# Patient Record
Sex: Female | Born: 1950 | Race: White | Hispanic: No | Marital: Married | Smoking: Former smoker
Health system: Southern US, Community
[De-identification: ages and names within clinical notes are randomized; demographics above are authoritative.]

## PROBLEM LIST (undated history)

## (undated) DIAGNOSIS — R519 Headache, unspecified: Secondary | ICD-10-CM

## (undated) DIAGNOSIS — M8580 Other specified disorders of bone density and structure, unspecified site: Secondary | ICD-10-CM

## (undated) DIAGNOSIS — E785 Hyperlipidemia, unspecified: Secondary | ICD-10-CM

## (undated) DIAGNOSIS — D241 Benign neoplasm of right breast: Secondary | ICD-10-CM

## (undated) DIAGNOSIS — D332 Benign neoplasm of brain, unspecified: Secondary | ICD-10-CM

## (undated) DIAGNOSIS — E669 Obesity, unspecified: Secondary | ICD-10-CM

## (undated) DIAGNOSIS — F329 Major depressive disorder, single episode, unspecified: Secondary | ICD-10-CM

## (undated) DIAGNOSIS — F32A Depression, unspecified: Secondary | ICD-10-CM

## (undated) DIAGNOSIS — F3340 Major depressive disorder, recurrent, in remission, unspecified: Secondary | ICD-10-CM

## (undated) DIAGNOSIS — I739 Peripheral vascular disease, unspecified: Secondary | ICD-10-CM

## (undated) DIAGNOSIS — N189 Chronic kidney disease, unspecified: Secondary | ICD-10-CM

## (undated) DIAGNOSIS — R55 Syncope and collapse: Secondary | ICD-10-CM

## (undated) DIAGNOSIS — D242 Benign neoplasm of left breast: Secondary | ICD-10-CM

## (undated) DIAGNOSIS — E119 Type 2 diabetes mellitus without complications: Secondary | ICD-10-CM

## (undated) HISTORY — DX: Obesity, unspecified: E66.9

## (undated) HISTORY — DX: Major depressive disorder, single episode, unspecified: F32.9

## (undated) HISTORY — DX: Hyperlipidemia, unspecified: E78.5

## (undated) HISTORY — DX: Type 2 diabetes mellitus without complications: E11.9

## (undated) HISTORY — PX: COSMETIC SURGERY: SHX468

## (undated) HISTORY — DX: Peripheral vascular disease, unspecified: I73.9

## (undated) HISTORY — PX: ABDOMINAL HYSTERECTOMY: SHX81

## (undated) HISTORY — DX: Depression, unspecified: F32.A

## (undated) HISTORY — DX: Other specified disorders of bone density and structure, unspecified site: M85.80

## (undated) HISTORY — DX: Syncope and collapse: R55

## (undated) HISTORY — DX: Major depressive disorder, recurrent, in remission, unspecified: F33.40

---

## 1973-03-15 HISTORY — PX: CHOLECYSTECTOMY: SHX55

## 1996-03-15 DIAGNOSIS — R55 Syncope and collapse: Secondary | ICD-10-CM

## 1996-03-15 HISTORY — DX: Syncope and collapse: R55

## 2005-12-01 ENCOUNTER — Ambulatory Visit: Payer: Self-pay | Admitting: Internal Medicine

## 2005-12-30 ENCOUNTER — Ambulatory Visit: Payer: Self-pay | Admitting: Internal Medicine

## 2006-03-23 ENCOUNTER — Ambulatory Visit: Payer: Self-pay | Admitting: Internal Medicine

## 2006-04-26 ENCOUNTER — Ambulatory Visit: Payer: Self-pay | Admitting: Internal Medicine

## 2006-05-05 ENCOUNTER — Encounter: Payer: Self-pay | Admitting: Internal Medicine

## 2006-09-01 DIAGNOSIS — F3340 Major depressive disorder, recurrent, in remission, unspecified: Secondary | ICD-10-CM | POA: Insufficient documentation

## 2006-09-01 DIAGNOSIS — E785 Hyperlipidemia, unspecified: Secondary | ICD-10-CM | POA: Insufficient documentation

## 2006-09-01 HISTORY — DX: Major depressive disorder, recurrent, in remission, unspecified: F33.40

## 2006-09-21 ENCOUNTER — Ambulatory Visit: Payer: Self-pay | Admitting: Internal Medicine

## 2007-01-31 ENCOUNTER — Telehealth (INDEPENDENT_AMBULATORY_CARE_PROVIDER_SITE_OTHER): Payer: Self-pay | Admitting: *Deleted

## 2007-02-22 ENCOUNTER — Encounter: Payer: Self-pay | Admitting: Internal Medicine

## 2007-04-24 ENCOUNTER — Ambulatory Visit: Payer: Self-pay | Admitting: Internal Medicine

## 2007-04-25 ENCOUNTER — Encounter: Payer: Self-pay | Admitting: Internal Medicine

## 2007-10-23 ENCOUNTER — Ambulatory Visit: Payer: Self-pay | Admitting: Internal Medicine

## 2007-10-23 DIAGNOSIS — R002 Palpitations: Secondary | ICD-10-CM | POA: Insufficient documentation

## 2007-10-25 LAB — CONVERTED CEMR LAB
AST: 21 units/L (ref 0–37)
Albumin: 3.9 g/dL (ref 3.5–5.2)
Alkaline Phosphatase: 97 units/L (ref 39–117)
Basophils Relative: 0.5 % (ref 0.0–3.0)
Calcium: 9.4 mg/dL (ref 8.4–10.5)
Creatinine, Ser: 1.1 mg/dL (ref 0.4–1.2)
Creatinine,U: 97.1 mg/dL
Eosinophils Relative: 1.2 % (ref 0.0–5.0)
GFR calc Af Amer: 66 mL/min
GFR calc non Af Amer: 55 mL/min
HDL: 30.7 mg/dL — ABNORMAL LOW (ref >39.0)
Hemoglobin: 14.9 g/dL (ref 12.0–15.0)
Hgb A1c MFr Bld: 12.6 % — ABNORMAL HIGH (ref 4.6–6.0)
Lymphocytes Relative: 29 % (ref 12.0–46.0)
MCHC: 34.8 g/dL (ref 30.0–36.0)
MCV: 89.8 fL (ref 78.0–100.0)
Neutro Abs: 4.6 10*3/uL (ref 1.4–7.7)
Neutrophils Relative %: 64.3 % (ref 43.0–77.0)
Phosphorus: 3.4 mg/dL (ref 2.3–4.6)
RBC: 4.78 M/uL (ref 3.87–5.11)
Sodium: 136 meq/L (ref 135–145)
Total Protein: 6.7 g/dL (ref 6.0–8.3)
VLDL: 105 mg/dL — ABNORMAL HIGH (ref 0–40)
WBC: 7.2 10*3/uL (ref 4.5–10.5)

## 2007-10-26 ENCOUNTER — Telehealth: Payer: Self-pay | Admitting: Internal Medicine

## 2007-11-07 ENCOUNTER — Ambulatory Visit: Payer: Self-pay | Admitting: Internal Medicine

## 2007-11-07 ENCOUNTER — Encounter: Payer: Self-pay | Admitting: Internal Medicine

## 2007-11-07 DIAGNOSIS — R928 Other abnormal and inconclusive findings on diagnostic imaging of breast: Secondary | ICD-10-CM | POA: Insufficient documentation

## 2007-11-09 ENCOUNTER — Ambulatory Visit: Payer: Self-pay | Admitting: Internal Medicine

## 2007-11-13 ENCOUNTER — Ambulatory Visit: Payer: Self-pay | Admitting: Internal Medicine

## 2007-11-13 ENCOUNTER — Encounter: Payer: Self-pay | Admitting: Internal Medicine

## 2007-11-14 ENCOUNTER — Encounter: Payer: Self-pay | Admitting: Internal Medicine

## 2007-11-24 ENCOUNTER — Ambulatory Visit: Payer: Self-pay | Admitting: Internal Medicine

## 2007-11-24 DIAGNOSIS — R079 Chest pain, unspecified: Secondary | ICD-10-CM | POA: Insufficient documentation

## 2008-03-05 ENCOUNTER — Ambulatory Visit: Payer: Self-pay | Admitting: Internal Medicine

## 2008-03-06 LAB — CONVERTED CEMR LAB: Hgb A1c MFr Bld: 10.8 % — ABNORMAL HIGH (ref 4.6–6.0)

## 2008-04-10 ENCOUNTER — Ambulatory Visit: Payer: Self-pay | Admitting: Internal Medicine

## 2008-04-11 ENCOUNTER — Telehealth: Payer: Self-pay | Admitting: Internal Medicine

## 2008-04-11 LAB — CONVERTED CEMR LAB
ALT: 18 units/L (ref 0–35)
Albumin: 3.9 g/dL (ref 3.5–5.2)
Bilirubin, Direct: 0.1 mg/dL (ref 0.0–0.3)
Cholesterol: 206 mg/dL (ref 0–200)
HDL: 30.9 mg/dL — ABNORMAL LOW (ref >39.0)
Total Protein: 7.3 g/dL (ref 6.0–8.3)
Triglycerides: 196 mg/dL — ABNORMAL HIGH (ref 0–149)
VLDL: 39 mg/dL (ref 0–40)

## 2008-07-03 ENCOUNTER — Ambulatory Visit: Payer: Self-pay | Admitting: Internal Medicine

## 2008-07-04 LAB — CONVERTED CEMR LAB: Hgb A1c MFr Bld: 9.4 % — ABNORMAL HIGH (ref 4.6–6.5)

## 2008-12-25 ENCOUNTER — Ambulatory Visit: Payer: Self-pay | Admitting: Internal Medicine

## 2008-12-26 ENCOUNTER — Encounter: Payer: Self-pay | Admitting: Internal Medicine

## 2008-12-26 ENCOUNTER — Ambulatory Visit: Payer: Self-pay | Admitting: Internal Medicine

## 2008-12-26 LAB — CONVERTED CEMR LAB
BUN: 9 mg/dL (ref 6–23)
Bilirubin, Direct: 0.1 mg/dL (ref 0.0–0.3)
CO2: 30 meq/L (ref 19–32)
Calcium: 9.4 mg/dL (ref 8.4–10.5)
Chloride: 100 meq/L (ref 96–112)
Eosinophils Relative: 2.6 % (ref 0.0–5.0)
GFR calc non Af Amer: 54.3 mL/min (ref >60)
HCT: 41.5 % (ref 36.0–46.0)
HDL: 36.4 mg/dL — ABNORMAL LOW (ref >39.00)
Hemoglobin: 14.1 g/dL (ref 12.0–15.0)
Hgb A1c MFr Bld: 8.7 % — ABNORMAL HIGH (ref 4.6–6.5)
Lymphs Abs: 2.3 10*3/uL (ref 0.7–4.0)
MCV: 90.9 fL (ref 78.0–100.0)
Monocytes Relative: 4.7 % (ref 3.0–12.0)
Neutro Abs: 5.2 10*3/uL (ref 1.4–7.7)
RDW: 12.8 % (ref 11.5–14.6)
Total Bilirubin: 1.1 mg/dL (ref 0.3–1.2)
VLDL: 55.2 mg/dL — ABNORMAL HIGH (ref 0.0–40.0)
WBC: 8.2 10*3/uL (ref 4.5–10.5)

## 2008-12-31 ENCOUNTER — Encounter: Payer: Self-pay | Admitting: Internal Medicine

## 2009-01-07 ENCOUNTER — Ambulatory Visit: Payer: Self-pay | Admitting: Internal Medicine

## 2009-01-08 ENCOUNTER — Encounter: Payer: Self-pay | Admitting: Internal Medicine

## 2009-07-01 ENCOUNTER — Ambulatory Visit: Payer: Self-pay | Admitting: Internal Medicine

## 2009-07-02 LAB — CONVERTED CEMR LAB: Hgb A1c MFr Bld: 9.9 % — ABNORMAL HIGH (ref 4.6–6.5)

## 2009-12-30 ENCOUNTER — Encounter: Payer: Self-pay | Admitting: Internal Medicine

## 2009-12-30 ENCOUNTER — Ambulatory Visit: Payer: Self-pay | Admitting: Internal Medicine

## 2009-12-31 LAB — CONVERTED CEMR LAB
ALT: 17 units/L (ref 0–35)
Alkaline Phosphatase: 92 units/L (ref 39–117)
BUN: 10 mg/dL (ref 6–23)
Basophils Absolute: 0 10*3/uL (ref 0.0–0.1)
Bilirubin, Direct: 0.2 mg/dL (ref 0.0–0.3)
Chloride: 103 meq/L (ref 96–112)
Creatinine, Ser: 1.1 mg/dL (ref 0.4–1.2)
Creatinine,U: 246 mg/dL
Eosinophils Absolute: 0.1 10*3/uL (ref 0.0–0.7)
GFR calc non Af Amer: 53.55 mL/min (ref >60)
HCT: 41.2 % (ref 36.0–46.0)
Hgb A1c MFr Bld: 11.2 % — ABNORMAL HIGH (ref 4.6–6.5)
Lymphs Abs: 2 10*3/uL (ref 0.7–4.0)
Microalb Creat Ratio: 1 mg/g (ref 0.0–30.0)
Microalb, Ur: 2.5 mg/dL — ABNORMAL HIGH (ref 0.0–1.9)
Monocytes Absolute: 0.5 10*3/uL (ref 0.1–1.0)
Monocytes Relative: 5.5 % (ref 3.0–12.0)
Platelets: 238 10*3/uL (ref 150.0–400.0)
RDW: 13.9 % (ref 11.5–14.6)
TSH: 1.93 microintl units/mL (ref 0.35–5.50)
Total Protein: 6.4 g/dL (ref 6.0–8.3)
VLDL: 48.4 mg/dL — ABNORMAL HIGH (ref 0.0–40.0)

## 2010-01-20 ENCOUNTER — Ambulatory Visit: Payer: Self-pay | Admitting: Internal Medicine

## 2010-01-22 ENCOUNTER — Encounter: Payer: Self-pay | Admitting: Internal Medicine

## 2010-04-14 NOTE — Letter (Signed)
Summary: [facility]: Immunoassay Fecal Occult Blood (iFOB) Order Form  [name] [facility]  [address] [place], [place] [address]   Phone: [phone]  Fax: [phone]      [name]: Immunoassay Fecal Occult Blood (iFOB) Order Form   December 30, 2009 MRN: [id number]   Jacqueline Cole 08/23/1950   Physicican Name:_______[place]__________________  Diagnosis Code:________V76.51     Cindee Salt MD

## 2010-04-14 NOTE — Assessment & Plan Note (Signed)
Summary: CPX/RBH   Vital Signs:  Patient profile:   60 year old female Weight:      236 pounds BMI:     39.41 Temp:     98.1 degrees F oral Pulse rate:   76 / minute Pulse rhythm:   regular BP sitting:   140 / 80  (left arm) Cuff size:   large  Vitals Entered By: Mervin Hack CMA Duncan Dull) (December 30, 2009 8:22 AM) CC: adult physical   History of Present Illness: Having trouble with sinuses relates to hay fever Feels some better with mucinex (decongestant)  Did go up on lantus to 35 units two times a day  realizes that she is only taking the metformin once a day Not checking her sugars No hypoglycemic reactions eye exam fine in March  Really feels great now No sig exercise   Allergies: No Known Drug Allergies  Past History:  Past medical, surgical, family and social histories (including risk factors) reviewed for relevance to current acute and chronic problems.  Past Medical History: Reviewed history from 11/24/2007 and no changes required. Depression Diabetes mellitus, type II Hyperlipidemia Obesity  Past Surgical History: Reviewed history from 09/01/2006 and no changes required. Cholecystectomy 1975 Hysterectomy 2001 injury R face-- plastic surgery as  child DM diagnosis (syncope ) 1998 vaginal delivery x 2  Family History: Reviewed history from 03/05/2008 and no changes required. Father: deceased 23 CVA, MI Mother: deceased 28  MVA Siblings: 3 brothers deceased (1 @55 --MI, 1 young in MVA, 1 lung cancer)                3 sisters 1 deceased breast cancer                 Strong CAD history HTN in pat GM No DM Breast cancer in sister and aunt No colon cancer  Social History: Reviewed history from 07/01/2009 and no changes required. 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 Eyes:  Denies double vision and vision loss-1 eye. ENT:  Complains of ringing in ears; denies decreased hearing; full dentures--no mouth sores. CV:  Complains of palpitations; denies chest pain or discomfort, difficulty breathing at night, difficulty breathing while lying down, fainting, lightheadness, and shortness of breath with exertion; palps mostly at night in bed--feels it going fast. Resp:  Complains of cough; denies shortness of breath; allergy cough. GI:  Complains of indigestion; denies abdominal pain, bloody stools, change in bowel habits, dark tarry stools, nausea, and vomiting; occ reflux---tums helps. GU:  Denies dysuria and incontinence. MS:  Complains of joint pain; denies joint swelling; having recent right neck and shoulder pain---may be from sleeping on different pillow at beach. Derm:  Denies lesion(s) and rash. Neuro:  Complains of headaches; denies numbness, tingling, and weakness; head pressure from sinus. Psych:  Denies anxiety and depression. Heme:  Denies abnormal bruising and enlarge lymph nodes. Allergy:  Complains of seasonal allergies and sneezing.  Physical Exam  General:  alert and normal appearance.   Eyes:  pupils equal, pupils round, pupils reactive to light, and no optic disk abnormalities.   Ears:  R ear normal and L ear normal.   Mouth:  no erythema, no exudates, and no lesions.   Neck:  supple, no masses, no thyromegaly, no carotid bruits, and no cervical lymphadenopathy.   Breasts:  no masses, no abnormal thickening, no tenderness, and no adenopathy.   Lungs:  normal respiratory effort, no intercostal retractions, no accessory muscle use, and normal breath sounds.   Heart:  normal rate, regular rhythm, no murmur, and no gallop.   Abdomen:  soft, non-tender, and no masses.   Msk:  no joint tenderness and no joint swelling.   Pulses:  faint in feet Extremities:  no  edema Neurologic:  alert & oriented X3, strength normal in all extremities, and gait normal.   Skin:  no rashes, no suspicious lesions, and no ulcerations.   Psych:  normally interactive, good eye contact, not anxious appearing, and not depressed appearing.    Diabetes Management Exam:    Foot Exam (with socks and/or shoes not present):       Sensory-Pinprick/Light touch:          Left medial foot (L-4): normal          Left dorsal foot (L-5): normal          Left lateral foot (S-1): normal          Right medial foot (L-4): normal          Right dorsal foot (L-5): normal          Right lateral foot (S-1): normal       Inspection:          Left foot: normal          Right foot: normal       Nails:          Left foot: normal          Right foot: normal    Eye Exam:       Eye Exam done elsewhere          Date: 05/13/2009          Results: No retinopathy          Done by: Dr Clydene Pugh   Impression & Recommendations:  Problem # 1:  PREVENTIVE HEALTH CARE (ICD-V70.0) Assessment Comment Only due for mammo will do stool immunoassay again discussed fitness flu shot given  Problem # 2:  DIABETES MELLITUS, TYPE II (ICD-250.00) Assessment: Comment Only  still seems to have poor control will have her increase the metformin to two times a day (incorrectly only taking it daily) needs to work on lifestyle since she doesn't want more insulin  Her updated medication list for this problem includes:    Metformin Hcl 1000 Mg Tabs (Metformin hcl) .Marland Kitchen... Take 1 tablet by mouth two times a day    Glipizide Xl 5 Mg Tb24 (Glipizide) .Marland Kitchen... 1 daily by mouth every am    Lantus 100 Unit/ml Soln (Insulin glargine) ..... Inject 35  units two times a day  Labs Reviewed: Creat: 1.1 (12/25/2008)     Last Eye Exam: No retinopathy (05/13/2009) Reviewed HgBA1c results: 9.9 (07/01/2009)  8.7 (12/25/2008)  Orders: TLB-A1C / Hgb A1C (Glycohemoglobin) (83036-A1C) TLB-Microalbumin/Creat Ratio, Urine  (82043-MALB) TLB-Renal Function Panel (80069-RENAL) TLB-CBC Platelet - w/Differential (85025-CBCD) TLB-TSH (Thyroid Stimulating Hormone) (84443-TSH) Venipuncture (84132)  Problem # 3:  SCREENING COLORECTAL-CANCER (ICD-V76.51) Assessment: Comment Only seems benign EKG not concerning discussed increasing physical activity  Problem # 4:  HYPERLIPIDEMIA (ICD-272.4) Assessment: Unchanged  has had good control due for labs  Her updated medication list for this problem includes:    Pravastatin Sodium 40 Mg Tabs (  Pravastatin sodium) .Marland Kitchen... 1 daily  Labs Reviewed: SGOT: 20 (12/25/2008)   SGPT: 19 (12/25/2008)  Prior 10 Yr Risk Heart Disease: Not enough information (07/01/2009)   HDL:36.40 (12/25/2008), 30.9 (04/10/2008)  LDL:DEL (04/10/2008), DEL (10/23/2007)  Chol:193 (12/25/2008), 206 (04/10/2008)  Trig:276.0 (12/25/2008), 196 (04/10/2008)  Orders: TLB-Lipid Panel (80061-LIPID) TLB-Hepatic/Liver Function Pnl (80076-HEPATIC)  Problem # 5:  DEPRESSION (ICD-311) Assessment: Improved mood is very good now no changes   Her updated medication list for this problem includes:    Fluoxetine Hcl 20 Mg Caps (Fluoxetine hcl) .Marland Kitchen... 1 daily in morning    Venlafaxine Hcl 150 Mg Xr24h-tab (Venlafaxine hcl) .Marland Kitchen... 1 two times a day  Complete Medication List: 1)  Metformin Hcl 1000 Mg Tabs (Metformin hcl) .... Take 1 tablet by mouth two times a day 2)  Glipizide Xl 5 Mg Tb24 (Glipizide) .Marland Kitchen.. 1 daily by mouth every am 3)  Fluoxetine Hcl 20 Mg Caps (Fluoxetine hcl) .Marland Kitchen.. 1 daily in morning 4)  Lantus 100 Unit/ml Soln (Insulin glargine) .... Inject 35  units two times a day 5)  Venlafaxine Hcl 150 Mg Xr24h-tab (Venlafaxine hcl) .Marland Kitchen.. 1 two times a day 6)  Pravastatin Sodium 40 Mg Tabs (Pravastatin sodium) .Marland Kitchen.. 1 daily 7)  Relion Micro Test Strp (Glucose blood) .... Test 3 times daily or as directed 8)  Bd Insulin Syringe 25g X 5/8" 1 Ml Misc (Insulin syringe-needle u-100) .... Use daily as directed  (may substitiute for insurance) 9)  Multivitamins Tabs (Multiple vitamin) .... Take 1 tablet by mouth once a day 10)  Biotin Tabs (Biotin tabs) .... Daily  by mouth 11)  Fish Oil Concentrate 1000 Mg Caps (Omega-3 fatty acids) .Marland Kitchen.. 1 tablet once a day by mouth  Other Orders: Flu Vaccine 28yrs + (29562) Admin 1st Vaccine (13086) EKG w/ Interpretation (93000) Radiology Referral (Radiology)  Patient Instructions: 1)  Please try loratadine 10mg  1-2 daily or cetirizine 10mg  daily for sinus 2)  Please make sure you take the metformin twice a day 3)  Please schedule a follow-up appointment in 6 months .  4)  Schedule your mammogram.  5)  Complete your hemoccult cards and return them soon.    Orders Added: 1)  Flu Vaccine 66yrs + [90658] 2)  Admin 1st Vaccine [90471] 3)  EKG w/ Interpretation [93000] 4)  Est. Patient 40-64 years [99396] 5)  TLB-A1C / Hgb A1C (Glycohemoglobin) [83036-A1C] 6)  TLB-Microalbumin/Creat Ratio, Urine [82043-MALB] 7)  TLB-Lipid Panel [80061-LIPID] 8)  TLB-Hepatic/Liver Function Pnl [80076-HEPATIC] 9)  TLB-Renal Function Panel [80069-RENAL] 10)  TLB-CBC Platelet - w/Differential [85025-CBCD] 11)  TLB-TSH (Thyroid Stimulating Hormone) [84443-TSH] 12)  Venipuncture [57846] 13)  Radiology Referral [Radiology]   Immunizations Administered:  Influenza Vaccine # 1:    Vaccine Type: Fluvax 3+    Site: right deltoid    Mfr: GlaxoSmithKline    Dose: 0.5 ml    Route: IM    Given by: Mervin Hack CMA (AAMA)    Exp. Date: 09/12/2010    Lot #: [id number]    VIS given: 10/07/09 version given December 30, 2009.  Flu Vaccine Consent Questions:    Do you have a history of severe allergic reactions to this vaccine? no    Any prior history of allergic reactions to egg and/or gelatin? no    Do you have a sensitivity to the preservative Thimersol? no    Do you have a past history of Guillan-Barre Syndrome? no    Do you currently have an acute febrile illness? no  Have you ever had a severe reaction to latex? no    Vaccine information given and explained to patient? yes    Are you currently pregnant? no   Immunizations Administered:  Influenza Vaccine # 1:    Vaccine Type: Fluvax 3+    Site: right deltoid    Mfr: GlaxoSmithKline    Dose: 0.5 ml    Route: IM    Given by: Mervin Hack CMA (AAMA)    Exp. Date: 09/12/2010    Lot #: [id number]    VIS given: 10/07/09 version given December 30, 2009.  Current Allergies (reviewed today): No known allergies

## 2010-04-14 NOTE — Assessment & Plan Note (Signed)
Summary: 8:00 APPT 6 MONTH FOLLOW UP/RBH   Vital Signs:  Patient profile:   60 year old female Weight:      231 pounds Temp:     98 degrees F oral Pulse rate:   72 / minute Pulse rhythm:   regular BP sitting:   112 / 70  (left arm) Cuff size:   large  Vitals Entered By: Mervin Hack CMA Duncan Dull) (July 01, 2009 7:58 AM) CC: 6 month follow-up, Hypertension Management   History of Present Illness: Doing fairly well Has decided not to try to go to work  DOing okay with just husband's income Satified with this  Had flu and then stomach flu in March  Walking about an hour a day 20 minute miles now  Checks sugars once a day 210-240 ---- checks if she doesn't feel right. These are random and not fasting Feels bad if sugars go below 130 No serious hypoglycemia  No chest pain No SOB some allergy problems---occ uses benedryl (does knock her out)  Hypertension History:      Positive major cardiovascular risk factors include female age 79 years old or older, diabetes, and hyperlipidemia.  Negative major cardiovascular risk factors include non-tobacco-user status.     Allergies: No Known Drug Allergies  Past History:  Past medical, surgical, family and social histories (including risk factors) reviewed for relevance to current acute and chronic problems.  Past Medical History: Reviewed history from 11/24/2007 and no changes required. Depression Diabetes mellitus, type II Hyperlipidemia Obesity  Past Surgical History: Reviewed history from 09/01/2006 and no changes required. Cholecystectomy 1975 Hysterectomy 2001 injury R face-- plastic surgery as  child DM diagnosis (syncope ) 1998 vaginal delivery x 2  Family History: Reviewed history from 03/05/2008 and no changes required. 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   Neck:  supple, no masses, no thyromegaly, no carotid bruits, and no cervical lymphadenopathy.   Lungs:  normal respiratory effort and normal breath sounds.   Heart:  normal rate, regular rhythm, no murmur, and no gallop.   Msk:  no joint tenderness and no joint swelling.   Pulses:  1+ in feet Extremities:  no edema Skin:  no rashes, no suspicious lesions, and no ulcerations.   Psych:  normally interactive, good eye contact, not anxious appearing, and not depressed appearing.    Diabetes Management Exam:    Foot Exam (with socks and/or shoes not present):       Sensory-Pinprick/Light touch:          Left medial foot (L-4): normal          Left dorsal foot (L-5): normal          Left lateral foot (S-1): normal  Right medial foot (L-4): normal          Right dorsal foot (L-5): normal          Right lateral foot (S-1): normal       Inspection:          Left foot: normal          Right foot: normal       Nails:          Left foot: normal          Right foot: normal    Eye Exam:       Eye Exam done elsewhere          Date: 11/13/2008          Results: No retinopathy          Done by: Dr Clydene Pugh   Impression & Recommendations:  Problem # 1:  DIABETES MELLITUS, TYPE II (ICD-250.00) Assessment Unchanged  seems to be better with lifestyle hopefully will be better goal <8% but action if back over 9%  Her updated medication list for this problem includes:    Metformin Hcl 1000 Mg Tabs (Metformin hcl)  .Marland Kitchen... Take 1 tablet by mouth two times a day    Glipizide Xl 5 Mg Tb24 (Glipizide) .Marland Kitchen... 1 daily by mouth every am    Lantus 100 Unit/ml Soln (Insulin glargine) ..... Inject 30  units two times a day  Labs Reviewed: Creat: 1.1 (12/25/2008)     Last Eye Exam: No retinopathy (11/13/2008) Reviewed HgBA1c results: 8.7 (12/25/2008)  9.4 (07/03/2008)  Orders: TLB-A1C / Hgb A1C (Glycohemoglobin) (83036-A1C) Venipuncture ([address])  Problem # 2:  DEPRESSION (ICD-311) Assessment: Improved mood has done well no changes  Her updated medication list for this problem includes:    Fluoxetine Hcl 20 Mg Caps (Fluoxetine hcl) .Marland Kitchen... 1 daily in morning    Venlafaxine Hcl 150 Mg Xr24h-tab (Venlafaxine hcl) .Marland Kitchen... 1 two times a day  Problem # 3:  HYPERLIPIDEMIA (ICD-272.4) Assessment: Unchanged fairly good control will check next time  Her updated medication list for this problem includes:    Pravastatin Sodium 40 Mg Tabs (Pravastatin sodium) .Marland Kitchen... 1 daily  Labs Reviewed: SGOT: 20 (12/25/2008)   SGPT: 19 (12/25/2008)  10 Yr Risk Heart Disease: Not enough information   HDL:36.40 (12/25/2008), 30.9 (04/10/2008)  LDL:DEL (04/10/2008), DEL (10/23/2007)  Chol:193 (12/25/2008), 206 (04/10/2008)  Trig:276.0 (12/25/2008), 196 (04/10/2008)  Complete Medication List: 1)  Metformin Hcl 1000 Mg Tabs (Metformin hcl) .... Take 1 tablet by mouth two times a day 2)  Glipizide Xl 5 Mg Tb24 (Glipizide) .Marland Kitchen.. 1 daily by mouth every am 3)  Fluoxetine Hcl 20 Mg Caps (Fluoxetine hcl) .Marland Kitchen.. 1 daily in morning 4)  Lantus 100 Unit/ml Soln (Insulin glargine) .... Inject 30  units two times a day 5)  Venlafaxine Hcl 150 Mg Xr24h-tab (Venlafaxine hcl) .Marland Kitchen.. 1 two times a day 6)  Pravastatin Sodium 40 Mg Tabs (Pravastatin sodium) .Marland Kitchen.. 1 daily 7)  Fish Oil Concentrate 1000 Mg Caps (Omega-3 fatty acids) .Marland Kitchen.. 1 tablet once a day by mouth 8)  Relion Micro Test Strp (Glucose blood) .... Test 3 times daily or as directed 9)  Bd  Insulin Syringe 25g X 5/8" 1 Ml Misc (Insulin syringe-needle u-100) .... Use daily as directed (may substitiute for insurance) 10)  Multivitamins Tabs (Multiple vitamin) .... Take 1 tablet by mouth once a day 11)  Biotin Tabs (Biotin tabs) .... Daily  by mouth  Hypertension Assessment/Plan:  The patient's hypertensive risk group is category C: Target organ damage and/or diabetes.  Today's blood pressure is 112/70.    Patient Instructions: 1)  Please loratadine 10mg   1-2 daily or cetirizine 10mg  daily for allergy symptoms 2)  Please schedule a follow-up appointment in 6 months for physical  Current Allergies (reviewed today): No known allergies

## 2010-04-14 NOTE — Letter (Signed)
Summary: Results Follow up Letter  [place] [facility]  [address] [place], [place] [address]   Phone: [phone]  Fax: [phone]    01/22/2010 MRN: [id number]  Jacqueline Cole [address] [place], [place]  [address]  Dear [name],  The following are the results of your recent test(s):  Test         Result    Pap Smear:        Normal _____  Not Normal _____ Comments: ______________________________________________________ Cholesterol: LDL(Bad cholesterol):         Your goal is less than:         HDL (Good cholesterol):       Your goal is more than: Comments:  ______________________________________________________ Mammogram:        Normal _____  Not Normal _____ Comments:  ___________________________________________________________________ Hemoccult:        Normal __X___  Not normal _______ Comments:  stool test does not show any blood, we will plan to repeat this next year  _____________________________________________________________________ Other Tests:    We routinely do not discuss normal results over the telephone.  If you desire a copy of the results, or you have any questions about this information we can discuss them at your next office visit.   Sincerely,      Tillman Abide, MD

## 2010-05-16 ENCOUNTER — Encounter: Payer: Self-pay | Admitting: Internal Medicine

## 2010-07-01 ENCOUNTER — Other Ambulatory Visit: Payer: Self-pay | Admitting: Internal Medicine

## 2010-07-01 ENCOUNTER — Ambulatory Visit (INDEPENDENT_AMBULATORY_CARE_PROVIDER_SITE_OTHER): Payer: PRIVATE HEALTH INSURANCE | Admitting: Internal Medicine

## 2010-07-01 ENCOUNTER — Encounter: Payer: Self-pay | Admitting: Internal Medicine

## 2010-07-01 ENCOUNTER — Telehealth: Payer: Self-pay | Admitting: *Deleted

## 2010-07-01 VITALS — BP 118/80 | HR 94 | Temp 98.2°F | Ht 65.0 in | Wt 233.0 lb

## 2010-07-01 DIAGNOSIS — E119 Type 2 diabetes mellitus without complications: Secondary | ICD-10-CM

## 2010-07-01 DIAGNOSIS — F329 Major depressive disorder, single episode, unspecified: Secondary | ICD-10-CM

## 2010-07-01 DIAGNOSIS — E785 Hyperlipidemia, unspecified: Secondary | ICD-10-CM

## 2010-07-01 MED ORDER — METHYLPHENIDATE HCL 5 MG PO TABS: 5.0000 mg | ORAL_TABLET | Freq: Two times a day (BID) | ORAL | Status: DC

## 2010-07-01 MED ORDER — INSULIN GLARGINE 100 UNIT/ML ~~LOC~~ SOLN: 50.0000 [IU] | Freq: Two times a day (BID) | SUBCUTANEOUS | Status: DC

## 2010-07-01 MED ORDER — GLIPIZIDE ER 5 MG PO TB24: 5.0000 mg | ORAL_TABLET | Freq: Two times a day (BID) | ORAL | Status: DC

## 2010-07-01 NOTE — Telephone Encounter (Signed)
[facility] keeps old instructions even when you change them--this can be confusing

## 2010-07-01 NOTE — Progress Notes (Signed)
Subjective:    Patient ID: Jacqueline Cole, female    DOB: 1950/12/25, 60 y.o.   MRN: [id number]  HPI Feels well  Not sweating like she used to Had period where she lost a lot of her hair--December Cut it short and increased biotin Using vitamin E and rogaine and getting better  Checks sugars 1-3 times per day Over 250 at times Average 220 Rotates the insulin regularly---legs, arms, abd (less often due to scar)  Still has issues with depression Has daily feelings of sadness Has never considered suicide  Continues on pravastatin No major muscle aches  Current outpatient prescriptions:BIOTIN PO, Take by mouth daily.  , Disp: , Rfl: ;  fish oil-omega-3 fatty acids 1000 MG capsule, Take 1 g by mouth daily.  , Disp: , Rfl: ;  FLUoxetine (PROZAC) 20 MG capsule, Take 20 mg by mouth daily.  , Disp: , Rfl: ;  glipiZIDE (GLUCOTROL) 5 MG 24 hr tablet, Take 5 mg by mouth every morning.  , Disp: , Rfl: ;  glucose blood (RELION MICRO TEST) test strip, Test 3 times daily or as directed , Disp: , Rfl:  insulin glargine (LANTUS) 100 UNIT/ML injection, Inject into the skin. Inject 35 units two times a day , Disp: , Rfl: ;  Insulin Syringe-Needle U-100 25G X 5/8" 1 ML MISC, by Does not apply route. Use daily as directed (may substitute for insurance) , Disp: , Rfl: ;  metFORMIN (GLUCOPHAGE) 1000 MG tablet, Take 1,000 mg by mouth 2 (two) times daily.  , Disp: , Rfl: ;  Multiple Vitamin (MULTIVITAMIN PO), Take by mouth.  , Disp: , Rfl:  pravastatin (PRAVACHOL) 40 MG tablet, Take 40 mg by mouth daily.  , Disp: , Rfl: ;  venlafaxine (EFFEXOR-XR) 150 MG 24 hr capsule, Take 150 mg by mouth 2 (two) times daily.  , Disp: , Rfl:   . Past Medical History  Diagnosis Date  . Depression   . Diabetes mellitus type II   . Hyperlipemia   . Obesity   . Syncope 1998    DM diagnosis    Past Surgical History  Procedure Date  . Cholecystectomy 1975  . Cosmetic surgery     Injury Right face as a child  . Vaginal  delivery     X 2    Family History  Problem Relation Age of Onset  . Stroke Father     CVA  . Heart disease Father     MI  . Cancer Sister     breast cancer  . Heart disease Brother     MI  . Cancer Brother     Lung    History   Social History  . Marital Status: Married    Spouse Name: N/A    Number of Children: 2  . Years of Education: N/A   Occupational History  .      "Just up and quit" at [employer]   Social History Main Topics  . Smoking status: Former Smoker    Quit date: 03/15/1993  . Smokeless tobacco: Not on file  . Alcohol Use: No  . Drug Use: Not on file  . Sexually Active: Not on file   Other Topics Concern  . Not on file   Social History Narrative  . No narrative on file   Review of Systems Appetite okay--eats the wrong things and too much when depressed Trying to walk Weight down 2#    Objective:   Physical Exam  Constitutional: She is oriented to person, place, and time. She appears well-developed and well-nourished. No distress.  Neck: Normal range of motion. Neck supple. No thyromegaly present.  Cardiovascular: Normal rate, regular rhythm, normal heart sounds and intact distal pulses.  Exam reveals no gallop.   No murmur heard. Pulmonary/Chest: Effort normal and breath sounds normal. No respiratory distress. She has no wheezes. She has no rales.  Musculoskeletal: Normal range of motion. She exhibits no edema and no tenderness.  Lymphadenopathy:    She has no cervical adenopathy.  Neurological: She is alert and oriented to person, place, and time.  Skin: Skin is warm. No rash noted.  Psychiatric: Her behavior is normal. Judgment and thought content normal.       Dysthymic mood with appropriate affect          Assessment & Plan:

## 2010-07-01 NOTE — Telephone Encounter (Signed)
Note faxed from [facility] [address].  They asked for clarification on pt's lantus prescription.  Script they received had two sets of instructons- 50 units twice a day and 35 units twice a day.  I read in the chart that the correct instructions were for 40 units twice a day, increase by 5 units every 4-5 days until fasting sugars are under 180.  That's what I called in.

## 2010-07-01 NOTE — Patient Instructions (Signed)
Please increase the glipizide to twice a day Increase the lantus to 40 units twice a day and increase by another 5 units every 4-5 days until fasting sugars under 180 (like 40 in AM, 45 in PM-----then 45 twice a day)

## 2010-07-31 ENCOUNTER — Ambulatory Visit (INDEPENDENT_AMBULATORY_CARE_PROVIDER_SITE_OTHER): Payer: PRIVATE HEALTH INSURANCE | Admitting: Internal Medicine

## 2010-07-31 ENCOUNTER — Encounter: Payer: Self-pay | Admitting: Internal Medicine

## 2010-07-31 VITALS — BP 130/80 | HR 97 | Temp 98.2°F | Ht 65.0 in | Wt 230.0 lb

## 2010-07-31 DIAGNOSIS — F329 Major depressive disorder, single episode, unspecified: Secondary | ICD-10-CM

## 2010-07-31 DIAGNOSIS — E119 Type 2 diabetes mellitus without complications: Secondary | ICD-10-CM

## 2010-07-31 MED ORDER — METFORMIN HCL 1000 MG PO TABS: 1000.0000 mg | ORAL_TABLET | Freq: Two times a day (BID) | ORAL | Status: DC

## 2010-07-31 MED ORDER — VENLAFAXINE HCL ER 150 MG PO CP24: 150.0000 mg | ORAL_CAPSULE | Freq: Two times a day (BID) | ORAL | Status: DC

## 2010-07-31 MED ORDER — FLUOXETINE HCL 20 MG PO CAPS: 20.0000 mg | ORAL_CAPSULE | Freq: Every day | ORAL | Status: DC

## 2010-07-31 MED ORDER — PRAVASTATIN SODIUM 40 MG PO TABS: 40.0000 mg | ORAL_TABLET | Freq: Every day | ORAL | Status: DC

## 2010-07-31 MED ORDER — METHYLPHENIDATE HCL 5 MG PO TABS: 5.0000 mg | ORAL_TABLET | Freq: Two times a day (BID) | ORAL | Status: DC

## 2010-07-31 NOTE — Patient Instructions (Signed)
Please cut your evening dose of insulin to 40 units. If the low sugar reactions continue, cut the morning dose to 40 also

## 2010-07-31 NOTE — Progress Notes (Signed)
Subjective:    Patient ID: Jacqueline Cole, female    DOB: 02/06/1951, 60 y.o.   MRN: [id number]  HPI Has noticed her sweats are back Not just her head but her whole body Needs to use towels in bed  Noticed minimal loss of appetite in the first week on ritalin Appetite definitely down on the ritalin Started walking 2 miles per day the second week Sugars are better and has had some low sugar reactions  Mood is much better Sleeping better other than the sweats  Current outpatient prescriptions:BIOTIN PO, Take by mouth daily.  , Disp: , Rfl: ;  fish oil-omega-3 fatty acids 1000 MG capsule, Take 1 g by mouth daily.  , Disp: , Rfl: ;  FLUoxetine (PROZAC) 20 MG capsule, Take 1 capsule (20 mg total) by mouth daily., Disp: 90 capsule, Rfl: 3;  glipiZIDE (GLUCOTROL) 5 MG 24 hr tablet, Take 1 tablet (5 mg total) by mouth 2 (two) times daily., Disp: 60 tablet, Rfl: 11 glucose blood (RELION MICRO TEST) test strip, Test 3 times daily or as directed , Disp: , Rfl: ;  insulin glargine (LANTUS) 100 UNIT/ML injection, Inject 50 Units into the skin 2 (two) times daily. Inject 35 units two times a day, Disp: 30 mL, Rfl: 11;  Insulin Syringe-Needle U-100 25G X 5/8" 1 ML MISC, by Does not apply route. Use daily as directed (may substitute for insurance) , Disp: , Rfl:  metFORMIN (GLUCOPHAGE) 1000 MG tablet, Take 1 tablet (1,000 mg total) by mouth 2 (two) times daily., Disp: 60 tablet, Rfl: 11;  methylphenidate (RITALIN) 5 MG tablet, Take 1 tablet (5 mg total) by mouth 2 (two) times daily., Disp: 60 tablet, Rfl: 0;  Multiple Vitamin (MULTIVITAMIN PO), Take by mouth.  , Disp: , Rfl: ;  pravastatin (PRAVACHOL) 40 MG tablet, Take 1 tablet (40 mg total) by mouth daily., Disp: 30 tablet, Rfl: 11 venlafaxine (EFFEXOR-XR) 150 MG 24 hr capsule, Take 1 capsule (150 mg total) by mouth 2 (two) times daily., Disp: 60 capsule, Rfl: 11;  DISCONTD: FLUoxetine (PROZAC) 20 MG capsule, TAKE ONE CAPSULE BY MOUTH IN THE MORNING, Disp: 90  capsule, Rfl: 0;  DISCONTD: metFORMIN (GLUCOPHAGE) 1000 MG tablet, Take 1,000 mg by mouth 2 (two) times daily.  , Disp: , Rfl: ;  DISCONTD: pravastatin (PRAVACHOL) 40 MG tablet, Take 40 mg by mouth daily.  , Disp: , Rfl:  DISCONTD: venlafaxine (EFFEXOR-XR) 150 MG 24 hr capsule, Take 150 mg by mouth 2 (two) times daily.  , Disp: , Rfl: ;  DISCONTD: Venlafaxine HCl 150 MG TB24, TAKE ONE TABLET BY MOUTH TWICE DAILY, Disp: 60 each, Rfl: 3  Past Medical History  Diagnosis Date  . Depression   . Diabetes mellitus type II   . Hyperlipemia   . Obesity   . Syncope 1998    DM diagnosis    Past Surgical History  Procedure Date  . Cholecystectomy 1975  . Cosmetic surgery     Injury Right face as a child  . Vaginal delivery     X 2    Family History  Problem Relation Age of Onset  . Stroke Father     CVA  . Heart disease Father     MI  . Cancer Sister     breast cancer  . Heart disease Brother     MI  . Cancer Brother     Lung    History   Social History  . Marital Status: Married  Spouse Name: N/A    Number of Children: 2  . Years of Education: N/A   Occupational History  .      "Just up and quit" at [employer]   Social History Main Topics  . Smoking status: Former Smoker    Quit date: 03/15/1993  . Smokeless tobacco: Not on file  . Alcohol Use: No  . Drug Use: Not on file  . Sexually Active: Not on file   Other Topics Concern  . Not on file   Social History Narrative  . No narrative on file   Review of Systems Occ feels her heart race====not really new but may be more noticeable No chest pain No SOB Lost 4.5#    Objective:   Physical Exam  Constitutional: She appears well-developed and well-nourished.  Psychiatric: She has a normal mood and affect. Her behavior is normal. Judgment and thought content normal.          Assessment & Plan:

## 2010-08-12 ENCOUNTER — Other Ambulatory Visit: Payer: Self-pay | Admitting: *Deleted

## 2010-08-12 MED ORDER — GLUCOSE BLOOD VI STRP: ORAL_STRIP | Status: DC

## 2010-08-12 NOTE — Telephone Encounter (Signed)
Patient changed ins provider and they no longer cover her current meter and test strips. Per pharmacy her insurance covers accucheck. New rx for accucheck compact (includes lancets and strips) sent to patients pharmacy.

## 2010-08-28 ENCOUNTER — Other Ambulatory Visit: Payer: Self-pay | Admitting: *Deleted

## 2010-08-28 MED ORDER — METHYLPHENIDATE HCL 5 MG PO TABS: 5.0000 mg | ORAL_TABLET | Freq: Two times a day (BID) | ORAL | Status: DC

## 2010-08-31 ENCOUNTER — Other Ambulatory Visit: Payer: Self-pay | Admitting: *Deleted

## 2010-08-31 MED ORDER — METHYLPHENIDATE HCL 5 MG PO TABS: 5.0000 mg | ORAL_TABLET | Freq: Two times a day (BID) | ORAL | Status: DC

## 2010-08-31 NOTE — Telephone Encounter (Signed)
Pt had called on Friday to have her ritalin script refilled.  This was sent to Dr. Dayton Martes, but script cannot be found.  Pt needs this today, she is out and has had a death in the family.  Please call when ready.

## 2010-08-31 NOTE — Telephone Encounter (Signed)
Left message on machine that rx is ready for pick-up, and it will be at our front desk.  

## 2010-09-01 MED ORDER — METHYLPHENIDATE HCL 5 MG PO TABS: 5.0000 mg | ORAL_TABLET | Freq: Two times a day (BID) | ORAL | Status: DC

## 2010-09-01 NOTE — Telephone Encounter (Signed)
Spoke with patients husband and he stated that patient picked up this Rx on yesterday.

## 2010-09-25 ENCOUNTER — Other Ambulatory Visit: Payer: Self-pay | Admitting: *Deleted

## 2010-09-25 MED ORDER — METFORMIN HCL 1000 MG PO TABS: 1000.0000 mg | ORAL_TABLET | Freq: Two times a day (BID) | ORAL | Status: DC

## 2010-09-28 ENCOUNTER — Other Ambulatory Visit: Payer: Self-pay | Admitting: *Deleted

## 2010-09-28 MED ORDER — METHYLPHENIDATE HCL 5 MG PO TABS: 5.0000 mg | ORAL_TABLET | Freq: Two times a day (BID) | ORAL | Status: DC

## 2010-09-28 NOTE — Telephone Encounter (Signed)
Spoke with patient and advised results   

## 2010-09-28 NOTE — Telephone Encounter (Signed)
Please call pt when ready.

## 2010-10-29 ENCOUNTER — Other Ambulatory Visit: Payer: Self-pay | Admitting: *Deleted

## 2010-10-29 MED ORDER — METHYLPHENIDATE HCL 5 MG PO TABS: 5.0000 mg | ORAL_TABLET | Freq: Two times a day (BID) | ORAL | Status: DC

## 2010-10-29 NOTE — Telephone Encounter (Signed)
Spoke with patient and advised results   

## 2010-11-04 ENCOUNTER — Ambulatory Visit (INDEPENDENT_AMBULATORY_CARE_PROVIDER_SITE_OTHER): Payer: PRIVATE HEALTH INSURANCE | Admitting: Internal Medicine

## 2010-11-04 ENCOUNTER — Encounter: Payer: Self-pay | Admitting: Internal Medicine

## 2010-11-04 DIAGNOSIS — F329 Major depressive disorder, single episode, unspecified: Secondary | ICD-10-CM

## 2010-11-04 DIAGNOSIS — E119 Type 2 diabetes mellitus without complications: Secondary | ICD-10-CM

## 2010-11-04 NOTE — Assessment & Plan Note (Signed)
Hopefully better control Goal <8% No action now other than lifestyle if under or close to 9%

## 2010-11-04 NOTE — Progress Notes (Signed)
Subjective:    Patient ID: Jacqueline Cole, female    DOB: [birth year], 60 y.o.   MRN: [id number]  HPI Doing "great" Mood is still good Her adoptive nephew just committed suicide---she was able to cope Tolerating the ritalin  Appetite did increase again and eating the wrong things after nephew's death Now working back towards eating right  Doing a lot of yard work Has stopped the walking in past week due to the weather-otherwise doing 2 miles per day House is spotless  Sugars have been much better Usually 80-100 fasting. 180 before lunch. Usually 150 again before supper No hypoglycemia lately Has adjusted lantus some--discussed this  Current Outpatient Prescriptions on File Prior to Visit  Medication Sig Dispense Refill  . BIOTIN PO Take by mouth daily.        . fish oil-omega-3 fatty acids 1000 MG capsule Take 1 g by mouth daily.        Marland Kitchen FLUoxetine (PROZAC) 20 MG capsule Take 1 capsule (20 mg total) by mouth daily.  90 capsule  3  . glipiZIDE (GLUCOTROL) 5 MG 24 hr tablet Take 1 tablet (5 mg total) by mouth 2 (two) times daily.  60 tablet  11  . glucose blood (ACCU-CHEK COMPACT STRIPS) test strip Use as instructed  100 each  12  . insulin glargine (LANTUS) 100 UNIT/ML injection Inject 50 Units into the skin every morning. And 40 units in the evening       . Insulin Syringe-Needle U-100 25G X 5/8" 1 ML MISC by Does not apply route. Use daily as directed (may substitute for insurance)       . metFORMIN (GLUCOPHAGE) 1000 MG tablet Take 1 tablet (1,000 mg total) by mouth 2 (two) times daily.  90 tablet  1  . methylphenidate (RITALIN) 5 MG tablet Take 1 tablet (5 mg total) by mouth 2 (two) times daily.  60 tablet  0  . Multiple Vitamin (MULTIVITAMIN PO) Take by mouth.        . pravastatin (PRAVACHOL) 40 MG tablet Take 1 tablet (40 mg total) by mouth daily.  30 tablet  11  . venlafaxine (EFFEXOR-XR) 150 MG 24 hr capsule Take 1 capsule (150 mg total) by mouth 2 (two) times daily.  60  capsule  11    No Known Allergies  Past Medical History  Diagnosis Date  . Depression   . Diabetes mellitus type II   . Hyperlipemia   . Obesity   . Syncope 1998    DM diagnosis    Past Surgical History  Procedure Date  . Cholecystectomy 1975  . Cosmetic surgery     Injury Right face as a child  . Vaginal delivery     X 2    Family History  Problem Relation Age of Onset  . Stroke Father     CVA  . Heart disease Father     MI  . Cancer Sister     breast cancer  . Heart disease Brother     MI  . Cancer Brother     Lung    History   Social History  . Marital Status: Married    Spouse Name: N/A    Number of Children: 2  . Years of Education: N/A   Occupational History  .      "Just up and quit" at [employer]   Social History Main Topics  . Smoking status: Former Smoker    Quit date: 03/15/1993  . Smokeless  tobacco: Never Used  . Alcohol Use: No  . Drug Use: Not on file  . Sexually Active: Not on file   Other Topics Concern  . Not on file   Social History Narrative  . No narrative on file   Review of Systems Sleeps well Weight is down another 6-7#     Objective:   Physical Exam  Constitutional: She appears well-developed and well-nourished. No distress.  Psychiatric: She has a normal mood and affect. Her behavior is normal. Judgment and thought content normal.          Assessment & Plan:

## 2010-11-04 NOTE — Assessment & Plan Note (Signed)
Continues to do well Continue current meds exercise

## 2010-12-02 ENCOUNTER — Other Ambulatory Visit: Payer: Self-pay | Admitting: *Deleted

## 2010-12-02 MED ORDER — METHYLPHENIDATE HCL 5 MG PO TABS: 5.0000 mg | ORAL_TABLET | Freq: Two times a day (BID) | ORAL | Status: DC

## 2010-12-02 NOTE — Telephone Encounter (Signed)
Spoke with patient and advised results   

## 2010-12-02 NOTE — Telephone Encounter (Signed)
Please call pt when ready.

## 2011-01-04 ENCOUNTER — Other Ambulatory Visit: Payer: Self-pay | Admitting: *Deleted

## 2011-01-04 MED ORDER — METHYLPHENIDATE HCL 5 MG PO TABS: 5.0000 mg | ORAL_TABLET | Freq: Two times a day (BID) | ORAL | Status: DC

## 2011-01-04 NOTE — Telephone Encounter (Signed)
Spoke with patient and advised results   

## 2011-01-04 NOTE — Telephone Encounter (Signed)
Please call patient when ready. 

## 2011-02-01 ENCOUNTER — Other Ambulatory Visit: Payer: Self-pay | Admitting: *Deleted

## 2011-02-01 MED ORDER — METHYLPHENIDATE HCL 5 MG PO TABS: 5.0000 mg | ORAL_TABLET | Freq: Two times a day (BID) | ORAL | Status: DC

## 2011-02-01 NOTE — Telephone Encounter (Signed)
Mailbox is full couldn't leave message, will leave rx at the front desk.

## 2011-02-01 NOTE — Telephone Encounter (Signed)
Please call pt when ready.

## 2011-03-01 ENCOUNTER — Other Ambulatory Visit: Payer: Self-pay | Admitting: *Deleted

## 2011-03-01 MED ORDER — GLUCOSE BLOOD VI STRP: ORAL_STRIP | Status: DC

## 2011-03-12 ENCOUNTER — Other Ambulatory Visit: Payer: Self-pay | Admitting: Internal Medicine

## 2011-03-12 MED ORDER — METHYLPHENIDATE HCL 5 MG PO TABS: 5.0000 mg | ORAL_TABLET | Freq: Two times a day (BID) | ORAL | Status: DC

## 2011-03-12 NOTE — Telephone Encounter (Signed)
Left message on machine that rx is ready for pick-up, and it will be at our front desk.  

## 2011-03-12 NOTE — Telephone Encounter (Signed)
Refill request on Ritalin

## 2011-04-19 ENCOUNTER — Other Ambulatory Visit: Payer: Self-pay | Admitting: *Deleted

## 2011-04-19 MED ORDER — METHYLPHENIDATE HCL 5 MG PO TABS: 5.0000 mg | ORAL_TABLET | Freq: Two times a day (BID) | ORAL | Status: DC

## 2011-04-19 NOTE — Telephone Encounter (Signed)
Patient called requesting a refill on Ritalin. Please call when ready for pickup. 

## 2011-04-19 NOTE — Telephone Encounter (Signed)
Spoke with patient and advised results   

## 2011-05-03 ENCOUNTER — Encounter: Payer: Self-pay | Admitting: Internal Medicine

## 2011-05-03 ENCOUNTER — Ambulatory Visit (INDEPENDENT_AMBULATORY_CARE_PROVIDER_SITE_OTHER): Payer: PRIVATE HEALTH INSURANCE | Admitting: Internal Medicine

## 2011-05-03 VITALS — BP 120/80 | HR 103 | Temp 98.3°F | Ht 65.0 in | Wt 232.0 lb

## 2011-05-03 DIAGNOSIS — Z0000 Encounter for general adult medical examination without abnormal findings: Secondary | ICD-10-CM

## 2011-05-03 DIAGNOSIS — E119 Type 2 diabetes mellitus without complications: Secondary | ICD-10-CM

## 2011-05-03 DIAGNOSIS — F329 Major depressive disorder, single episode, unspecified: Secondary | ICD-10-CM

## 2011-05-03 DIAGNOSIS — E669 Obesity, unspecified: Secondary | ICD-10-CM

## 2011-05-03 DIAGNOSIS — E785 Hyperlipidemia, unspecified: Secondary | ICD-10-CM

## 2011-05-03 DIAGNOSIS — F3289 Other specified depressive episodes: Secondary | ICD-10-CM

## 2011-05-03 DIAGNOSIS — Z1211 Encounter for screening for malignant neoplasm of colon: Secondary | ICD-10-CM

## 2011-05-03 LAB — HEPATIC FUNCTION PANEL
ALT: 22 U/L (ref 0–35)
AST: 26 U/L (ref 0–37)
Albumin: 4 g/dL (ref 3.5–5.2)
Alkaline Phosphatase: 75 U/L (ref 39–117)
Bilirubin, Direct: 0 mg/dL (ref 0.0–0.3)
Total Bilirubin: 0.8 mg/dL (ref 0.3–1.2)
Total Protein: 6.9 g/dL (ref 6.0–8.3)

## 2011-05-03 LAB — BASIC METABOLIC PANEL
BUN: 11 mg/dL (ref 6–23)
CO2: 26 mEq/L (ref 19–32)
Chloride: 104 mEq/L (ref 96–112)
Creatinine, Ser: 1.2 mg/dL (ref 0.4–1.2)

## 2011-05-03 LAB — LIPID PANEL
HDL: 38 mg/dL — ABNORMAL LOW (ref >39.00)
Total CHOL/HDL Ratio: 5
VLDL: 61 mg/dL — ABNORMAL HIGH (ref 0.0–40.0)

## 2011-05-03 LAB — LDL CHOLESTEROL, DIRECT: Direct LDL: 109.7 mg/dL

## 2011-05-03 LAB — CBC WITH DIFFERENTIAL/PLATELET
Eosinophils Absolute: 0.1 10*3/uL (ref 0.0–0.7)
MCHC: 33.4 g/dL (ref 30.0–36.0)
MCV: 91.1 fl (ref 78.0–100.0)
Monocytes Absolute: 0.6 10*3/uL (ref 0.1–1.0)
Neutrophils Relative %: 69.1 % (ref 43.0–77.0)
Platelets: 241 10*3/uL (ref 150.0–400.0)
WBC: 9.1 10*3/uL (ref 4.5–10.5)

## 2011-05-03 LAB — MICROALBUMIN / CREATININE URINE RATIO: Creatinine,U: 293.9 mg/dL

## 2011-05-03 LAB — HEMOGLOBIN A1C: Hgb A1c MFr Bld: 9 % — ABNORMAL HIGH (ref 4.6–6.5)

## 2011-05-03 NOTE — Assessment & Plan Note (Signed)
Due for labs No problems with the med 

## 2011-05-03 NOTE — Assessment & Plan Note (Signed)
Discussed fitness Will set up mammo Stool immunoassay

## 2011-05-03 NOTE — Assessment & Plan Note (Signed)
Lab Results  Component Value Date   HGBA1C 8.9* 11/04/2010   Still not at proper control but this is much better than prior Will increase lantus to 50 bid if still over 8% She needs to do the rest with lifestyle for now

## 2011-05-03 NOTE — Assessment & Plan Note (Signed)
Discussed need for sig lifestyle modifications

## 2011-05-03 NOTE — Assessment & Plan Note (Signed)
Still controlled on venlafaxine and methylphenidate No changes

## 2011-05-03 NOTE — Progress Notes (Signed)
Subjective:    Patient ID: Jacqueline Cole, female    DOB: 1950-10-21, 61 y.o.   MRN: [id number]  HPI Doing well Mood is still good Has decided not to go back to work for now---doing okay financially Not as active due to winter Discussed joining gym---she has considered  Checking sugars usually 2-3 times per day Does adjust her lantus some---discussed not doing this Now on 50/40 bid dosing No hypoglycemic spells Average 160 Discussed checking just once a day  Current Outpatient Prescriptions on File Prior to Visit  Medication Sig Dispense Refill  . BIOTIN PO Take by mouth daily.        . fish oil-omega-3 fatty acids 1000 MG capsule Take 1 g by mouth daily.        Marland Kitchen FLUoxetine (PROZAC) 20 MG capsule Take 1 capsule (20 mg total) by mouth daily.  90 capsule  3  . glipiZIDE (GLUCOTROL) 5 MG 24 hr tablet Take 1 tablet (5 mg total) by mouth 2 (two) times daily.  60 tablet  11  . glucose blood (ACCU-CHEK COMPACT STRIPS) test strip Patient tests three times daily dx:250.00  300 each  3  . insulin glargine (LANTUS) 100 UNIT/ML injection Inject 50 Units into the skin every morning. And 40 units in the evening       . Insulin Syringe-Needle U-100 25G X 5/8" 1 ML MISC by Does not apply route. Use daily as directed (may substitute for insurance)       . metFORMIN (GLUCOPHAGE) 1000 MG tablet Take 1 tablet (1,000 mg total) by mouth 2 (two) times daily.  90 tablet  1  . methylphenidate (RITALIN) 5 MG tablet Take 1 tablet (5 mg total) by mouth 2 (two) times daily.  60 tablet  0  . Multiple Vitamin (MULTIVITAMIN PO) Take by mouth.        . pravastatin (PRAVACHOL) 40 MG tablet Take 1 tablet (40 mg total) by mouth daily.  30 tablet  11  . venlafaxine (EFFEXOR-XR) 150 MG 24 hr capsule Take 1 capsule (150 mg total) by mouth 2 (two) times daily.  60 capsule  11    No Known Allergies  Past Medical History  Diagnosis Date  . Depression   . Diabetes mellitus type II   . Hyperlipemia   . Obesity   .  Syncope 1998    DM diagnosis    Past Surgical History  Procedure Date  . Cholecystectomy 1975  . Cosmetic surgery     Injury Right face as a child  . Vaginal delivery     X 2  . Abdominal hysterectomy ~2005    and BSO    Family History  Problem Relation Age of Onset  . Stroke Father     CVA  . Heart disease Father     MI  . Cancer Sister     breast cancer  . Heart disease Brother     MI  . Cancer Brother     Lung    History   Social History  . Marital Status: Married    Spouse Name: N/A    Number of Children: 2  . Years of Education: N/A   Occupational History  .      "Just up and quit" at [employer]   Social History Main Topics  . Smoking status: Former Smoker    Quit date: 03/15/1993  . Smokeless tobacco: Never Used  . Alcohol Use: No  . Drug Use: Not on file  .  Sexually Active: Not on file   Other Topics Concern  . Not on file   Social History Narrative  . No narrative on file   Review of Systems  Constitutional: Positive for unexpected weight change. Negative for fatigue.       Wears seat belt  HENT: Positive for hearing loss, congestion, rhinorrhea and tinnitus. Negative for dental problem.        Mild hearing loss Some mild allergy symptoms---does well with OTC meds Full dentures--fit okay  Eyes: Negative for visual disturbance.       No diplopia or unilateral vision loss Occ brief blurred vision  Respiratory: Negative for cough, chest tightness and shortness of breath.   Cardiovascular: Positive for palpitations. Negative for chest pain and leg swelling.       Palps only if sugar gets low  Gastrointestinal: Negative for nausea, vomiting, abdominal pain, constipation and blood in stool.       No heartburn  Genitourinary: Negative for dysuria, urgency and difficulty urinating.       Uses pad for mild stress incontinence No sex--no problem  Musculoskeletal: Negative for back pain, joint swelling and arthralgias.  Skin: Negative for rash.        No suspicious areas  Neurological: Negative for dizziness, syncope, weakness, light-headedness, numbness and headaches.  Hematological: Negative for adenopathy. Does not bruise/bleed easily.  Psychiatric/Behavioral: Negative for sleep disturbance and dysphoric mood. The patient is not nervous/anxious.        Mood has been good       Objective:   Physical Exam  Constitutional: She is oriented to person, place, and time. She appears well-developed and well-nourished. No distress.  HENT:  Head: Normocephalic and atraumatic.  Right Ear: External ear normal.  Left Ear: External ear normal.  Mouth/Throat: Oropharynx is clear and moist. No oropharyngeal exudate.  Eyes: Conjunctivae and EOM are normal. Pupils are equal, round, and reactive to light.  Neck: Normal range of motion. Neck supple. No thyromegaly present.  Cardiovascular: Normal rate, regular rhythm, normal heart sounds and intact distal pulses.  Exam reveals no gallop.   No murmur heard.      Faint pedal pulses  Pulmonary/Chest: Effort normal and breath sounds normal. No respiratory distress. She has no wheezes. She has no rales.  Abdominal: Soft. There is no tenderness.  Genitourinary:       Breasts with no masses, sig cysts or discharge  Musculoskeletal: She exhibits no edema and no tenderness.  Lymphadenopathy:    She has no cervical adenopathy.    She has no axillary adenopathy.  Neurological: She is alert and oriented to person, place, and time.  Skin: Skin is warm. No rash noted. No erythema.  Psychiatric: She has a normal mood and affect. Her behavior is normal. Judgment and thought content normal.          Assessment & Plan:

## 2011-05-10 ENCOUNTER — Encounter: Payer: Self-pay | Admitting: *Deleted

## 2011-05-10 ENCOUNTER — Other Ambulatory Visit: Payer: Self-pay | Admitting: *Deleted

## 2011-05-10 MED ORDER — PRAVASTATIN SODIUM 80 MG PO TABS: 80.0000 mg | ORAL_TABLET | Freq: Every day | ORAL | Status: DC

## 2011-05-19 ENCOUNTER — Other Ambulatory Visit: Payer: Self-pay | Admitting: *Deleted

## 2011-05-19 MED ORDER — METHYLPHENIDATE HCL 5 MG PO TABS: 5.0000 mg | ORAL_TABLET | Freq: Two times a day (BID) | ORAL | Status: DC

## 2011-05-19 NOTE — Telephone Encounter (Signed)
Left message on machine that rx is ready for pick-up, and it will be at our front desk.  

## 2011-05-20 ENCOUNTER — Other Ambulatory Visit: Payer: Self-pay | Admitting: *Deleted

## 2011-05-20 MED ORDER — GLIPIZIDE ER 5 MG PO TB24: 5.0000 mg | ORAL_TABLET | Freq: Two times a day (BID) | ORAL | Status: DC

## 2011-05-21 ENCOUNTER — Telehealth: Payer: Self-pay | Admitting: *Deleted

## 2011-05-21 MED ORDER — GLIPIZIDE 5 MG PO TABS: 5.0000 mg | ORAL_TABLET | Freq: Two times a day (BID) | ORAL | Status: DC

## 2011-05-21 NOTE — Telephone Encounter (Signed)
Received another fax and pt takes 5mg  glipizide plain, rx sent to pharmacy by e-script

## 2011-05-21 NOTE — Telephone Encounter (Signed)
[facility]ax from [facility] asking for clarification on glipizide script, refills for XL were sent in, patient has always been on plain, is on your desk.

## 2011-06-29 ENCOUNTER — Other Ambulatory Visit: Payer: Self-pay

## 2011-06-29 MED ORDER — INSULIN GLARGINE 100 UNIT/ML ~~LOC~~ SOLN: 50.0000 [IU] | SUBCUTANEOUS | Status: DC

## 2011-06-29 MED ORDER — METHYLPHENIDATE HCL 5 MG PO TABS: 5.0000 mg | ORAL_TABLET | Freq: Two times a day (BID) | ORAL | Status: DC

## 2011-06-29 NOTE — Telephone Encounter (Signed)
Pt request written rx for Ritalin 5 mg. Pt last seen 05/03/11 and can be reached at [phone] when rx ready for pick up.Pt also requested 90 day refill on Lantus 100 unit/ml. Lantus # 90 ml x 3 sent to [facility].

## 2011-06-29 NOTE — Telephone Encounter (Signed)
Ritalin printed  Okay lantus for a year

## 2011-06-29 NOTE — Telephone Encounter (Signed)
Spoke with patient's husband and advised rx ready for pick-up and it will be at the front desk.  

## 2011-07-15 ENCOUNTER — Other Ambulatory Visit: Payer: Self-pay | Admitting: *Deleted

## 2011-07-15 MED ORDER — FLUOXETINE HCL 20 MG PO CAPS: 20.0000 mg | ORAL_CAPSULE | Freq: Every day | ORAL | Status: DC

## 2011-08-02 ENCOUNTER — Other Ambulatory Visit: Payer: Self-pay | Admitting: *Deleted

## 2011-08-02 MED ORDER — VENLAFAXINE HCL ER 150 MG PO CP24: 150.0000 mg | ORAL_CAPSULE | Freq: Two times a day (BID) | ORAL | Status: DC

## 2011-08-02 NOTE — Telephone Encounter (Signed)
rx sent to pharmacy by e-script  

## 2011-08-02 NOTE — Telephone Encounter (Signed)
Faxed refill request from [facility]. 

## 2011-08-02 NOTE — Telephone Encounter (Signed)
Okay to refill for 1 year. 

## 2011-11-01 ENCOUNTER — Encounter: Payer: Self-pay | Admitting: Internal Medicine

## 2011-11-01 ENCOUNTER — Ambulatory Visit (INDEPENDENT_AMBULATORY_CARE_PROVIDER_SITE_OTHER): Payer: PRIVATE HEALTH INSURANCE | Admitting: Internal Medicine

## 2011-11-01 VITALS — BP 120/80 | HR 86 | Temp 98.1°F | Ht 65.0 in | Wt 241.0 lb

## 2011-11-01 DIAGNOSIS — F329 Major depressive disorder, single episode, unspecified: Secondary | ICD-10-CM

## 2011-11-01 DIAGNOSIS — E785 Hyperlipidemia, unspecified: Secondary | ICD-10-CM

## 2011-11-01 DIAGNOSIS — E119 Type 2 diabetes mellitus without complications: Secondary | ICD-10-CM

## 2011-11-01 DIAGNOSIS — E669 Obesity, unspecified: Secondary | ICD-10-CM

## 2011-11-01 LAB — LIPID PANEL
Cholesterol: 170 mg/dL (ref 0–200)
Triglycerides: 264 mg/dL — ABNORMAL HIGH (ref 0.0–149.0)

## 2011-11-01 LAB — HEPATIC FUNCTION PANEL
ALT: 21 U/L (ref 0–35)
AST: 27 U/L (ref 0–37)
Albumin: 4.1 g/dL (ref 3.5–5.2)
Alkaline Phosphatase: 78 U/L (ref 39–117)
Total Bilirubin: 1 mg/dL (ref 0.3–1.2)

## 2011-11-01 LAB — LDL CHOLESTEROL, DIRECT: Direct LDL: 96.4 mg/dL

## 2011-11-01 MED ORDER — METHYLPHENIDATE HCL 5 MG PO TABS: 5.0000 mg | ORAL_TABLET | Freq: Two times a day (BID) | ORAL | Status: DC

## 2011-11-01 NOTE — Patient Instructions (Signed)
Please restart the ritalin (methyphenidate) Cut the lantus dose to 40 units twice a day

## 2011-11-01 NOTE — Progress Notes (Signed)
Subjective:    Patient ID: Jacqueline Cole, female    DOB: 07-Jul-1950, 61 y.o.   MRN: [id number]  HPI Knows things are not the way they should do Feels she has hypoglycemic reactions and that prompts her to eat more Usually upon awakening Appetite is higher on the higher insulin Tries to walk at least a mile a day  "I am just existing now" Mood is not great---"I just don't have a life about me" Does keep her grandkids--they know she is sad. They are her only pleasure Sleeps a lot "I am just tired" No suicidal thoughts Has weaned off the methylphenidate  Notes some rapid heartbeat and chest pain Associates this with the low sugar reactions  Did go up on the pravastatin No problems with this No myalgias  Current Outpatient Prescriptions on File Prior to Visit  Medication Sig Dispense Refill  . BIOTIN PO Take by mouth daily.        . fish oil-omega-3 fatty acids 1000 MG capsule Take 1 g by mouth daily.        Marland Kitchen FLUoxetine (PROZAC) 20 MG capsule Take 1 capsule (20 mg total) by mouth daily.  90 capsule  3  . glipiZIDE (GLUCOTROL) 5 MG tablet Take 1 tablet (5 mg total) by mouth 2 (two) times daily before a meal.  90 tablet  3  . glucose blood (ACCU-CHEK COMPACT STRIPS) test strip Patient tests three times daily dx:250.00  300 each  3  . Insulin Syringe-Needle U-100 25G X 5/8" 1 ML MISC by Does not apply route. Use daily as directed (may substitute for insurance)       . metFORMIN (GLUCOPHAGE) 1000 MG tablet Take 1 tablet (1,000 mg total) by mouth 2 (two) times daily.  90 tablet  1  . methylphenidate (RITALIN) 5 MG tablet Take 1 tablet (5 mg total) by mouth 2 (two) times daily.  60 tablet  0  . Multiple Vitamin (MULTIVITAMIN PO) Take by mouth.        . pravastatin (PRAVACHOL) 80 MG tablet Take 1 tablet (80 mg total) by mouth daily.  90 tablet  3  . venlafaxine XR (EFFEXOR-XR) 150 MG 24 hr capsule Take 1 capsule (150 mg total) by mouth 2 (two) times daily.  60 capsule  11  . DISCONTD:  insulin glargine (LANTUS) 100 UNIT/ML injection Inject 50 Units into the skin every morning. And 40 units in the evening  90 mL  3    No Known Allergies  Past Medical History  Diagnosis Date  . Depression   . Diabetes mellitus type II   . Hyperlipemia   . Obesity   . Syncope 1998    DM diagnosis    Past Surgical History  Procedure Date  . Cholecystectomy 1975  . Cosmetic surgery     Injury Right face as a child  . Vaginal delivery     X 2  . Abdominal hysterectomy ~2005    and BSO    Family History  Problem Relation Age of Onset  . Stroke Father     CVA  . Heart disease Father     MI  . Cancer Sister     breast cancer  . Heart disease Brother     MI  . Cancer Brother     Lung    History   Social History  . Marital Status: Married    Spouse Name: N/A    Number of Children: 2  . Years of Education:  N/A   Occupational History  .      "Just up and quit" at [employer]   Social History Main Topics  . Smoking status: Former Smoker    Quit date: 03/15/1993  . Smokeless tobacco: Never Used  . Alcohol Use: No  . Drug Use: Not on file  . Sexually Active: Not on file   Other Topics Concern  . Not on file   Social History Narrative  . No narrative on file   Review of Systems Weight up 8# or so Not sleeping well though she sleeps a lot    Objective:   Physical Exam  Constitutional: She appears well-developed and well-nourished. No distress.  Neck: Normal range of motion. Neck supple. No thyromegaly present.  Cardiovascular: Normal rate, regular rhythm, normal heart sounds and intact distal pulses.  Exam reveals no gallop.   No murmur heard. Pulmonary/Chest: Effort normal and breath sounds normal. No respiratory distress. She has no wheezes. She has no rales.  Musculoskeletal: She exhibits no edema and no tenderness.  Lymphadenopathy:    She has no cervical adenopathy.  Psychiatric: Her behavior is normal. Thought content normal.       Mood is neutral  and affect appropriate          Assessment & Plan:

## 2011-11-01 NOTE — Assessment & Plan Note (Signed)
Clearly worse off the methylphenidate Will restart and check back soon

## 2011-11-01 NOTE — Assessment & Plan Note (Signed)
She is interested in researching bariatric surgery

## 2011-11-01 NOTE — Assessment & Plan Note (Signed)
No problems on higher dose of the med Will check labs

## 2011-11-01 NOTE — Assessment & Plan Note (Signed)
Poor control but having hypoglycemia in AM Mood and weight gain are a big part of this Will address depression Cut insulin dose and recheck soon  Consider adding [place]

## 2011-11-02 ENCOUNTER — Encounter: Payer: Self-pay | Admitting: *Deleted

## 2011-11-10 ENCOUNTER — Other Ambulatory Visit: Payer: Self-pay | Admitting: *Deleted

## 2011-11-10 MED ORDER — GLIPIZIDE 5 MG PO TABS: 5.0000 mg | ORAL_TABLET | Freq: Two times a day (BID) | ORAL | Status: DC

## 2011-12-02 ENCOUNTER — Other Ambulatory Visit: Payer: Self-pay

## 2011-12-02 MED ORDER — METHYLPHENIDATE HCL 5 MG PO TABS: 5.0000 mg | ORAL_TABLET | Freq: Two times a day (BID) | ORAL | Status: DC

## 2011-12-02 NOTE — Telephone Encounter (Signed)
Pt request rx for Ritalin. Call when ready for pick up. 

## 2011-12-02 NOTE — Telephone Encounter (Signed)
Spoke with patient and advised rx ready for pick-up and it will be at the front desk.  

## 2011-12-07 ENCOUNTER — Other Ambulatory Visit: Payer: Self-pay | Admitting: *Deleted

## 2011-12-07 MED ORDER — METFORMIN HCL 1000 MG PO TABS: 1000.0000 mg | ORAL_TABLET | Freq: Two times a day (BID) | ORAL | Status: DC

## 2011-12-31 ENCOUNTER — Other Ambulatory Visit: Payer: Self-pay

## 2011-12-31 MED ORDER — METHYLPHENIDATE HCL 5 MG PO TABS: 5.0000 mg | ORAL_TABLET | Freq: Two times a day (BID) | ORAL | Status: DC

## 2011-12-31 NOTE — Telephone Encounter (Signed)
Pt request rx Ritalin. Call when ready for pick up. 

## 2011-12-31 NOTE — Telephone Encounter (Signed)
Left message on machine that rx is ready for pick-up, and it will be at our front desk.  

## 2012-01-05 ENCOUNTER — Ambulatory Visit: Payer: PRIVATE HEALTH INSURANCE | Admitting: Internal Medicine

## 2012-02-03 ENCOUNTER — Other Ambulatory Visit: Payer: Self-pay

## 2012-02-03 MED ORDER — METHYLPHENIDATE HCL 5 MG PO TABS: 5.0000 mg | ORAL_TABLET | Freq: Two times a day (BID) | ORAL | Status: DC

## 2012-02-03 NOTE — Telephone Encounter (Signed)
Please have her reschedule her follow up appt

## 2012-02-03 NOTE — Telephone Encounter (Signed)
Pt request rx ritalin. Call when ready for pick up. 

## 2012-02-03 NOTE — Telephone Encounter (Signed)
Left message on machine that rx is ready for pick-up, and it will be at our front desk. Also left message on VM and on envelope of rx that pt needs to schedule follow-up appt

## 2012-02-07 ENCOUNTER — Ambulatory Visit: Payer: PRIVATE HEALTH INSURANCE | Admitting: Internal Medicine

## 2012-02-15 ENCOUNTER — Ambulatory Visit (INDEPENDENT_AMBULATORY_CARE_PROVIDER_SITE_OTHER): Payer: PRIVATE HEALTH INSURANCE | Admitting: Internal Medicine

## 2012-02-15 ENCOUNTER — Encounter: Payer: Self-pay | Admitting: Internal Medicine

## 2012-02-15 VITALS — BP 130/80 | HR 91 | Temp 98.0°F | Ht 65.0 in | Wt 239.0 lb

## 2012-02-15 DIAGNOSIS — E1149 Type 2 diabetes mellitus with other diabetic neurological complication: Secondary | ICD-10-CM | POA: Insufficient documentation

## 2012-02-15 DIAGNOSIS — F329 Major depressive disorder, single episode, unspecified: Secondary | ICD-10-CM

## 2012-02-15 DIAGNOSIS — IMO0001 Reserved for inherently not codable concepts without codable children: Secondary | ICD-10-CM

## 2012-02-15 DIAGNOSIS — E1142 Type 2 diabetes mellitus with diabetic polyneuropathy: Secondary | ICD-10-CM | POA: Insufficient documentation

## 2012-02-15 DIAGNOSIS — E669 Obesity, unspecified: Secondary | ICD-10-CM

## 2012-02-15 NOTE — Progress Notes (Signed)
Subjective:    Patient ID: Jacqueline Cole, female    DOB: 1950/11/22, 61 y.o.   MRN: [id number]  HPI Doing better Mood is good Depression is controlled  Admits to dietary noncompliance Knows sugars are running high Did cut lantus---no more hypoglycemic spells Still checks bid--- fastings usually in 140's Checks at bedtime also-- usually ~170  Current Outpatient Prescriptions on File Prior to Visit  Medication Sig Dispense Refill  . BIOTIN PO Take by mouth daily.        . fish oil-omega-3 fatty acids 1000 MG capsule Take 1 g by mouth daily.        Marland Kitchen FLUoxetine (PROZAC) 20 MG capsule Take 1 capsule (20 mg total) by mouth daily.  90 capsule  3  . glipiZIDE (GLUCOTROL) 5 MG tablet Take 1 tablet (5 mg total) by mouth 2 (two) times daily before a meal.  180 tablet  3  . glucose blood (ACCU-CHEK COMPACT STRIPS) test strip Patient tests three times daily dx:250.00  300 each  3  . insulin glargine (LANTUS) 100 UNIT/ML injection Inject 40 Units into the skin 2 (two) times daily. And 40 units in the evening      . Insulin Syringe-Needle U-100 25G X 5/8" 1 ML MISC by Does not apply route. Use daily as directed (may substitute for insurance)       . metFORMIN (GLUCOPHAGE) 1000 MG tablet Take 1 tablet (1,000 mg total) by mouth 2 (two) times daily.  180 tablet  3  . methylphenidate (RITALIN) 5 MG tablet Take 1 tablet (5 mg total) by mouth 2 (two) times daily.  60 tablet  0  . Multiple Vitamin (MULTIVITAMIN PO) Take by mouth.        . pravastatin (PRAVACHOL) 80 MG tablet Take 1 tablet (80 mg total) by mouth daily.  90 tablet  3  . venlafaxine XR (EFFEXOR-XR) 150 MG 24 hr capsule Take 1 capsule (150 mg total) by mouth 2 (two) times daily.  60 capsule  11    No Known Allergies  Past Medical History  Diagnosis Date  . Depression   . Diabetes mellitus type II   . Hyperlipemia   . Obesity   . Syncope 1998    DM diagnosis    Past Surgical History  Procedure Date  . Cholecystectomy 1975  .  Cosmetic surgery     Injury Right face as a child  . Vaginal delivery     X 2  . Abdominal hysterectomy ~2005    and BSO    Family History  Problem Relation Age of Onset  . Stroke Father     CVA  . Heart disease Father     MI  . Cancer Sister     breast cancer  . Heart disease Brother     MI  . Cancer Brother     Lung    History   Social History  . Marital Status: Married    Spouse Name: N/A    Number of Children: 2  . Years of Education: N/A   Occupational History  .      "Just up and quit" at [employer]   Social History Main Topics  . Smoking status: Former Smoker    Quit date: 03/15/1993  . Smokeless tobacco: Never Used  . Alcohol Use: No  . Drug Use: Not on file  . Sexually Active: Not on file   Other Topics Concern  . Not on file   Social History  Narrative  . No narrative on file   Review of Systems Weight is down a few pounds Sleeps okay in general---occ bad nights where she is up and down      Objective:   Physical Exam  Constitutional: She appears well-developed and well-nourished. No distress.  Neck: Normal range of motion. Neck supple. No thyromegaly present.  Cardiovascular: Normal rate, regular rhythm and normal heart sounds.  Exam reveals no gallop.   No murmur heard. Pulmonary/Chest: Effort normal and breath sounds normal. No respiratory distress. She has no wheezes. She has no rales.  Musculoskeletal: She exhibits no edema and no tenderness.  Lymphadenopathy:    She has no cervical adenopathy.  Psychiatric: She has a normal mood and affect. Her behavior is normal.          Assessment & Plan:

## 2012-02-15 NOTE — Assessment & Plan Note (Signed)
Had been 50# heavier years ago--then down to ~200# Back off her proper eating and exercise Discussed getting back to proper care

## 2012-02-15 NOTE — Assessment & Plan Note (Signed)
Mood is better back on methyphenidate Will continue current regimen

## 2012-02-15 NOTE — Assessment & Plan Note (Signed)
Hasn't been exercising lately Not compliant with diet Discussed lifestyle issues  Will add januvia if still over 9%

## 2012-03-01 ENCOUNTER — Encounter: Payer: Self-pay | Admitting: *Deleted

## 2012-03-01 ENCOUNTER — Other Ambulatory Visit: Payer: Self-pay | Admitting: *Deleted

## 2012-03-01 MED ORDER — SITAGLIPTIN PHOSPHATE 100 MG PO TABS: 100.0000 mg | ORAL_TABLET | Freq: Every day | ORAL | Status: DC

## 2012-03-02 MED ORDER — METHYLPHENIDATE HCL 5 MG PO TABS: 5.0000 mg | ORAL_TABLET | Freq: Two times a day (BID) | ORAL | Status: DC

## 2012-03-02 NOTE — Telephone Encounter (Signed)
Left message on machine that rx is ready for pick-up, and it will be at our front desk.  

## 2012-03-14 ENCOUNTER — Other Ambulatory Visit: Payer: Self-pay | Admitting: *Deleted

## 2012-03-14 MED ORDER — GLUCOSE BLOOD VI STRP: ORAL_STRIP | Status: DC

## 2012-04-03 ENCOUNTER — Other Ambulatory Visit: Payer: Self-pay

## 2012-04-03 MED ORDER — METHYLPHENIDATE HCL 5 MG PO TABS: 5.0000 mg | ORAL_TABLET | Freq: Two times a day (BID) | ORAL | Status: DC

## 2012-04-03 NOTE — Telephone Encounter (Signed)
Pt request rx Ritalin. Call when ready for pick up. 

## 2012-04-03 NOTE — Telephone Encounter (Signed)
Spoke with patient and advised rx ready for pick-up and it will be at the front desk.  

## 2012-05-01 ENCOUNTER — Other Ambulatory Visit: Payer: Self-pay | Admitting: Internal Medicine

## 2012-05-11 ENCOUNTER — Other Ambulatory Visit: Payer: Self-pay

## 2012-05-11 MED ORDER — METHYLPHENIDATE HCL 5 MG PO TABS: 5.0000 mg | ORAL_TABLET | Freq: Two times a day (BID) | ORAL | Status: DC

## 2012-05-11 NOTE — Telephone Encounter (Signed)
Pt left v/m requesting rx Ritalin. Call when ready for pick up. 

## 2012-05-11 NOTE — Telephone Encounter (Signed)
Spoke with patient and advised rx ready for pick-up and it will be at the front desk.  

## 2012-05-29 ENCOUNTER — Other Ambulatory Visit: Payer: Self-pay | Admitting: *Deleted

## 2012-05-29 MED ORDER — VENLAFAXINE HCL ER 150 MG PO CP24: 150.0000 mg | ORAL_CAPSULE | Freq: Two times a day (BID) | ORAL | Status: DC

## 2012-05-30 ENCOUNTER — Ambulatory Visit: Payer: PRIVATE HEALTH INSURANCE | Admitting: Internal Medicine

## 2012-05-30 ENCOUNTER — Telehealth: Payer: Self-pay | Admitting: Internal Medicine

## 2012-05-30 MED ORDER — METHYLPHENIDATE HCL 5 MG PO TABS: 5.0000 mg | ORAL_TABLET | Freq: Two times a day (BID) | ORAL | Status: DC

## 2012-05-30 NOTE — Telephone Encounter (Signed)
Husband in He came instead of her for the appt today She will reschedule but needs her ritalin

## 2012-06-06 ENCOUNTER — Encounter: Payer: Self-pay | Admitting: Internal Medicine

## 2012-06-06 ENCOUNTER — Ambulatory Visit (INDEPENDENT_AMBULATORY_CARE_PROVIDER_SITE_OTHER): Payer: PRIVATE HEALTH INSURANCE | Admitting: Internal Medicine

## 2012-06-06 VITALS — BP 128/70 | HR 95 | Temp 98.3°F | Ht 65.0 in | Wt 249.0 lb

## 2012-06-06 DIAGNOSIS — IMO0001 Reserved for inherently not codable concepts without codable children: Secondary | ICD-10-CM

## 2012-06-06 LAB — BASIC METABOLIC PANEL
CO2: 28 mEq/L (ref 19–32)
Calcium: 9.2 mg/dL (ref 8.4–10.5)
Chloride: 102 mEq/L (ref 96–112)
Creatinine, Ser: 1.2 mg/dL (ref 0.4–1.2)
Glucose, Bld: 306 mg/dL — ABNORMAL HIGH (ref 70–99)
Sodium: 138 mEq/L (ref 135–145)

## 2012-06-06 NOTE — Assessment & Plan Note (Signed)
Clearly better but hasn't been monitoring Not willing to go to endocrinologist Will not increase meds now---she will really work on her fitness and weight Counseled/care management for all of 15 minute visit

## 2012-06-06 NOTE — Progress Notes (Signed)
Subjective:    Patient ID: Jacqueline Cole, female    DOB: Jul 20, 1950, 62 y.o.   MRN: [id number]  HPI Here for follow up Doing well with the januvia---"I feel wonderful" Has been keeping her house clean, sweats are gone, energy levels are good  Hasn't checked sugars in about 3 weeks---had felt so good Fasting sugars were 90-120 No hypoglycemic spells  Gained 10# and didn't realize Discussed watching the scale Hasn't been as active and vows to do better  Current Outpatient Prescriptions on File Prior to Visit  Medication Sig Dispense Refill  . BIOTIN PO Take by mouth daily.        . fish oil-omega-3 fatty acids 1000 MG capsule Take 1 g by mouth daily.        Marland Kitchen FLUoxetine (PROZAC) 20 MG capsule Take 1 capsule (20 mg total) by mouth daily.  90 capsule  3  . glipiZIDE (GLUCOTROL) 5 MG tablet Take 1 tablet (5 mg total) by mouth 2 (two) times daily before a meal.  180 tablet  3  . glucose blood (ACCU-CHEK COMPACT STRIPS) test strip Patient tests three times daily dx:250.00  300 each  3  . insulin glargine (LANTUS) 100 UNIT/ML injection Inject 40 Units into the skin 2 (two) times daily. And 40 units in the evening      . Insulin Syringe-Needle U-100 25G X 5/8" 1 ML MISC by Does not apply route. Use daily as directed (may substitute for insurance)       . metFORMIN (GLUCOPHAGE) 1000 MG tablet Take 1 tablet (1,000 mg total) by mouth 2 (two) times daily.  180 tablet  3  . methylphenidate (RITALIN) 5 MG tablet Take 1 tablet (5 mg total) by mouth 2 (two) times daily.  60 tablet  0  . Multiple Vitamin (MULTIVITAMIN PO) Take by mouth.        . pravastatin (PRAVACHOL) 80 MG tablet TAKE 1 TABLET (80 MG TOTAL) BY MOUTH DAILY.  90 tablet  3  . sitaGLIPtin (JANUVIA) 100 MG tablet Take 1 tablet (100 mg total) by mouth daily.  90 tablet  3  . venlafaxine XR (EFFEXOR-XR) 150 MG 24 hr capsule Take 1 capsule (150 mg total) by mouth 2 (two) times daily.  60 capsule  3   No current facility-administered  medications on file prior to visit.    No Known Allergies  Past Medical History  Diagnosis Date  . Depression   . Diabetes mellitus type II   . Hyperlipemia   . Obesity   . Syncope 1998    DM diagnosis    Past Surgical History  Procedure Laterality Date  . Cholecystectomy  1975  . Cosmetic surgery      Injury Right face as a child  . Vaginal delivery      X 2  . Abdominal hysterectomy  ~2005    and BSO    Family History  Problem Relation Age of Onset  . Stroke Father     CVA  . Heart disease Father     MI  . Cancer Sister     breast cancer  . Heart disease Brother     MI  . Cancer Brother     Lung    History   Social History  . Marital Status: Married    Spouse Name: N/A    Number of Children: 2  . Years of Education: N/A   Occupational History  .      "Just up and  quit" at [employer] History Main Topics  . Smoking status: Former Smoker    Quit date: 03/15/1993  . Smokeless tobacco: Never Used  . Alcohol Use: No  . Drug Use: Not on file  . Sexually Active: Not on file   Other Topics Concern  . Not on file   Social History Narrative  . No narrative on file   Review of Systems Sleeps okay Recent death of sister---doing okay     Objective:   Physical Exam        Assessment & Plan:

## 2012-07-12 ENCOUNTER — Other Ambulatory Visit: Payer: Self-pay

## 2012-07-12 MED ORDER — METHYLPHENIDATE HCL 5 MG PO TABS: 5.0000 mg | ORAL_TABLET | Freq: Two times a day (BID) | ORAL | Status: DC

## 2012-07-12 NOTE — Telephone Encounter (Signed)
Left message on machine that rx is ready for pick-up, and it will be at our front desk.  

## 2012-07-12 NOTE — Telephone Encounter (Signed)
Pt left v/m requesting rx Ritalin. Call when ready for pick up. 

## 2012-07-24 ENCOUNTER — Other Ambulatory Visit: Payer: Self-pay | Admitting: *Deleted

## 2012-07-24 MED ORDER — FLUOXETINE HCL 20 MG PO CAPS: 20.0000 mg | ORAL_CAPSULE | Freq: Every day | ORAL | Status: DC

## 2012-08-09 ENCOUNTER — Telehealth: Payer: Self-pay | Admitting: Internal Medicine

## 2012-08-09 NOTE — Telephone Encounter (Signed)
Patient Information:  Caller Name: [name]  Phone: [phone]  Patient: Jacqueline Cole  Gender: Female  DOB: 08-29-1950  Age: 62 Years  PCP: Tillman Abide [facility])  Office Follow Up:  Does the office need to follow up with this patient?: Yes  Instructions For The Office: PT REQUESTING RX FOR MEDICATION RITALIN.  PLEASE F/U WITH PT WHEN READY FOR PICK UP, THANK YOU.   Symptoms  Reason For Call & Symptoms: Pt requesting Rx on her Ritalin.  Please f/u with pt when ready for pick up.  Reviewed Health History In EMR: Yes  Reviewed Medications In EMR: Yes  Reviewed Allergies In EMR: Yes  Reviewed Surgeries / Procedures: Yes  Date of Onset of Symptoms: 08/09/2012  Guideline(s) Used:  No Protocol Available - Information Only  Disposition Per Guideline:   Discuss with PCP and Callback by Nurse Today  Reason For Disposition Reached:   Nursing judgment  Advice Given:  N/A  Patient Will Follow Care Advice:  YES

## 2012-08-10 MED ORDER — METHYLPHENIDATE HCL 5 MG PO TABS: 5.0000 mg | ORAL_TABLET | Freq: Two times a day (BID) | ORAL | Status: DC

## 2012-08-10 NOTE — Telephone Encounter (Signed)
Left message on machine that rx is ready for pick-up, and it will be at our front desk.  

## 2012-08-11 ENCOUNTER — Encounter: Payer: Self-pay | Admitting: Internal Medicine

## 2012-08-24 ENCOUNTER — Other Ambulatory Visit: Payer: Self-pay | Admitting: Family Medicine

## 2012-08-25 MED ORDER — VENLAFAXINE HCL ER 150 MG PO CP24: 150.0000 mg | ORAL_CAPSULE | Freq: Two times a day (BID) | ORAL | Status: DC

## 2012-08-25 NOTE — Telephone Encounter (Signed)
rx sent to pharmacy by e-script  

## 2012-08-25 NOTE — Telephone Encounter (Signed)
Okay to fill for a year 

## 2012-08-30 ENCOUNTER — Other Ambulatory Visit: Payer: Self-pay | Admitting: Internal Medicine

## 2012-09-05 ENCOUNTER — Encounter: Payer: Self-pay | Admitting: Internal Medicine

## 2012-09-08 ENCOUNTER — Ambulatory Visit (INDEPENDENT_AMBULATORY_CARE_PROVIDER_SITE_OTHER): Payer: PRIVATE HEALTH INSURANCE | Admitting: Internal Medicine

## 2012-09-08 ENCOUNTER — Encounter: Payer: Self-pay | Admitting: Internal Medicine

## 2012-09-08 VITALS — BP 140/80 | HR 86 | Temp 97.8°F | Ht 65.0 in | Wt 250.0 lb

## 2012-09-08 DIAGNOSIS — E785 Hyperlipidemia, unspecified: Secondary | ICD-10-CM

## 2012-09-08 DIAGNOSIS — Z0000 Encounter for general adult medical examination without abnormal findings: Secondary | ICD-10-CM

## 2012-09-08 DIAGNOSIS — E1149 Type 2 diabetes mellitus with other diabetic neurological complication: Secondary | ICD-10-CM

## 2012-09-08 DIAGNOSIS — Z1211 Encounter for screening for malignant neoplasm of colon: Secondary | ICD-10-CM

## 2012-09-08 DIAGNOSIS — F329 Major depressive disorder, single episode, unspecified: Secondary | ICD-10-CM

## 2012-09-08 DIAGNOSIS — F3289 Other specified depressive episodes: Secondary | ICD-10-CM

## 2012-09-08 LAB — LIPID PANEL
Cholesterol: 193 mg/dL (ref 0–200)
Total CHOL/HDL Ratio: 5
Triglycerides: 317 mg/dL — ABNORMAL HIGH (ref 0.0–149.0)

## 2012-09-08 LAB — HEPATIC FUNCTION PANEL
ALT: 26 U/L (ref 0–35)
AST: 32 U/L (ref 0–37)
Albumin: 4.1 g/dL (ref 3.5–5.2)
Alkaline Phosphatase: 73 U/L (ref 39–117)
Bilirubin, Direct: 0.1 mg/dL (ref 0.0–0.3)
Total Protein: 7.5 g/dL (ref 6.0–8.3)

## 2012-09-08 LAB — CBC WITH DIFFERENTIAL/PLATELET
Basophils Absolute: 0 10*3/uL (ref 0.0–0.1)
Basophils Relative: 0.5 % (ref 0.0–3.0)
Eosinophils Relative: 1.9 % (ref 0.0–5.0)
HCT: 41.5 % (ref 36.0–46.0)
Hemoglobin: 14 g/dL (ref 12.0–15.0)
Lymphocytes Relative: 21.1 % (ref 12.0–46.0)
Lymphs Abs: 2 10*3/uL (ref 0.7–4.0)
Monocytes Relative: 4.7 % (ref 3.0–12.0)
Neutro Abs: 6.8 10*3/uL (ref 1.4–7.7)
RBC: 4.61 Mil/uL (ref 3.87–5.11)
WBC: 9.5 10*3/uL (ref 4.5–10.5)

## 2012-09-08 LAB — BASIC METABOLIC PANEL
Calcium: 9.7 mg/dL (ref 8.4–10.5)
GFR: 56.57 mL/min — ABNORMAL LOW (ref >60.00)
Potassium: 4.4 mEq/L (ref 3.5–5.1)
Sodium: 138 mEq/L (ref 135–145)

## 2012-09-08 LAB — MICROALBUMIN / CREATININE URINE RATIO
Microalb Creat Ratio: 0.4 mg/g (ref 0.0–30.0)
Microalb, Ur: 0.8 mg/dL (ref 0.0–1.9)

## 2012-09-08 MED ORDER — VENLAFAXINE HCL ER 150 MG PO CP24: 150.0000 mg | ORAL_CAPSULE | Freq: Two times a day (BID) | ORAL | Status: DC

## 2012-09-08 MED ORDER — INSULIN GLARGINE 100 UNIT/ML ~~LOC~~ SOLN: 40.0000 [IU] | Freq: Two times a day (BID) | SUBCUTANEOUS | Status: DC

## 2012-09-08 MED ORDER — METHYLPHENIDATE HCL 5 MG PO TABS: 5.0000 mg | ORAL_TABLET | Freq: Two times a day (BID) | ORAL | Status: DC

## 2012-09-08 NOTE — Assessment & Plan Note (Signed)
mammo due early 2015 Fecal immunoassay Discussed fitness

## 2012-09-08 NOTE — Assessment & Plan Note (Signed)
Controlled with her meds 

## 2012-09-08 NOTE — Progress Notes (Signed)
Subjective:    Patient ID: Jacqueline Cole, female    DOB: [birth year], 62 y.o.   MRN: [id number]  HPI Here for physical Plans to get mammograms at [facility] as in the past No Pap since she had hysterectomy  Checks fasting sugars and occasionally other times 80-140 fasting Not that great with diet--discussed Only walks occasionally  Mood continues to be controlled No depression lately  Current Outpatient Prescriptions on File Prior to Visit  Medication Sig Dispense Refill  . BIOTIN PO Take by mouth daily.        . fish oil-omega-3 fatty acids 1000 MG capsule Take 1 g by mouth daily.        Marland Kitchen FLUoxetine (PROZAC) 20 MG capsule Take 1 capsule (20 mg total) by mouth daily.  90 capsule  0  . glipiZIDE (GLUCOTROL) 5 MG tablet Take 1 tablet (5 mg total) by mouth 2 (two) times daily before a meal.  180 tablet  3  . glucose blood (ACCU-CHEK COMPACT STRIPS) test strip Patient tests three times daily dx:250.00  300 each  3  . Insulin Syringe-Needle U-100 25G X 5/8" 1 ML MISC by Does not apply route. Use daily as directed (may substitute for insurance)       . metFORMIN (GLUCOPHAGE) 1000 MG tablet Take 1 tablet (1,000 mg total) by mouth 2 (two) times daily.  180 tablet  3  . methylphenidate (RITALIN) 5 MG tablet Take 1 tablet (5 mg total) by mouth 2 (two) times daily.  60 tablet  0  . Multiple Vitamin (MULTIVITAMIN PO) Take by mouth.        . pravastatin (PRAVACHOL) 80 MG tablet TAKE 1 TABLET (80 MG TOTAL) BY MOUTH DAILY.  90 tablet  3  . sitaGLIPtin (JANUVIA) 100 MG tablet Take 1 tablet (100 mg total) by mouth daily.  90 tablet  3   No current facility-administered medications on file prior to visit.    No Known Allergies  Past Medical History  Diagnosis Date  . Depression   . Diabetes mellitus type II   . Hyperlipemia   . Obesity   . Syncope 1998    DM diagnosis    Past Surgical History  Procedure Laterality Date  . Cholecystectomy  1975  . Cosmetic surgery      Injury Right face as  a child  . Vaginal delivery      X 2  . Abdominal hysterectomy  ~2005    and BSO    Family History  Problem Relation Age of Onset  . Stroke Father     CVA  . Heart disease Father     MI  . Cancer Sister     breast cancer  . Heart disease Brother     MI  . Cancer Brother     Lung    History   Social History  . Marital Status: Married    Spouse Name: N/A    Number of Children: 2  . Years of Education: N/A   Occupational History  .      "Just up and quit" at [employer]   Social History Main Topics  . Smoking status: Former Smoker    Quit date: 03/15/1993  . Smokeless tobacco: Never Used  . Alcohol Use: No  . Drug Use: Not on file  . Sexually Active: Not on file   Other Topics Concern  . Not on file   Social History Narrative  . No narrative on file  Review of Systems  Constitutional: Negative for fatigue and unexpected weight change.       Wears seat belt  HENT: Positive for hearing loss, congestion, rhinorrhea and tinnitus. Negative for dental problem.        Full dentures  Eyes: Negative for visual disturbance.       Overdue for diabetic eye exam  Respiratory: Positive for cough. Negative for chest tightness and shortness of breath.        Coughs from drainage  Cardiovascular: Positive for palpitations. Negative for chest pain.       Notices palpitations at night--fast Gets chronic sweats and some arm pain with this No problem with exertion  Gastrointestinal: Negative for nausea, vomiting, abdominal pain, constipation and blood in stool.       Rare heartburn--- tums/rolaids helps Will get ranitidine at times  Endocrine: Negative for cold intolerance and heat intolerance.  Genitourinary: Negative for dysuria, hematuria and difficulty urinating.       No sex--no problem  Musculoskeletal: Positive for arthralgias. Negative for back pain.       Neck and shoulder pains--advil helps but uses rarely  Skin: Negative for rash.       No suspicious lesions   Allergic/Immunologic: Positive for environmental allergies. Negative for immunocompromised state.       Mucinex d helps with congestion and sinus symptoms  Neurological: Positive for headaches. Negative for dizziness, syncope, weakness, light-headedness and numbness.       Relates headache to tinnitus Gets burning in feet at times  Hematological: Negative for adenopathy. Does not bruise/bleed easily.  Psychiatric/Behavioral: Negative for sleep disturbance and dysphoric mood. The patient is not nervous/anxious.        Objective:   Physical Exam  Constitutional: She is oriented to person, place, and time. She appears well-developed and well-nourished. No distress.  HENT:  Head: Normocephalic and atraumatic.  Right Ear: External ear normal.  Left Ear: External ear normal.  Mouth/Throat: Oropharynx is clear and moist. No oropharyngeal exudate.  Eyes: Conjunctivae and EOM are normal. Pupils are equal, round, and reactive to light.  Neck: Normal range of motion. Neck supple. No thyromegaly present.  Cardiovascular: Normal rate, regular rhythm, normal heart sounds and intact distal pulses.  Exam reveals no gallop.   No murmur heard. Faint distal pulses  Pulmonary/Chest: Effort normal and breath sounds normal. No respiratory distress. She has no wheezes. She has no rales.  Abdominal: Soft. There is no tenderness.  Genitourinary:  No breast masses or tenderness  Musculoskeletal: She exhibits no edema and no tenderness.  Lymphadenopathy:    She has no cervical adenopathy.    She has no axillary adenopathy.  Neurological: She is alert and oriented to person, place, and time.  Skin: No rash noted. No erythema.  No foot lesions  Psychiatric: She has a normal mood and affect. Her behavior is normal.          Assessment & Plan:

## 2012-09-08 NOTE — Assessment & Plan Note (Signed)
Early neuropathy Hopefully A1c under 8% If not, consider endocrine consult

## 2012-09-08 NOTE — Patient Instructions (Signed)
You can set up your own screening mammogram. I recommend them every 2 years.

## 2012-10-18 ENCOUNTER — Other Ambulatory Visit: Payer: Self-pay

## 2012-10-18 NOTE — Telephone Encounter (Signed)
Pt left v/m requesting rx ritalin; pt would like to pick up on 10/19/12. Call when ready for pick up.

## 2012-10-19 MED ORDER — METHYLPHENIDATE HCL 5 MG PO TABS: 5.0000 mg | ORAL_TABLET | Freq: Two times a day (BID) | ORAL | Status: DC

## 2012-10-19 NOTE — Telephone Encounter (Signed)
Printed and placed in Kim's box. 

## 2012-10-19 NOTE — Telephone Encounter (Signed)
Left message on machine that rx is ready for pick-up, and it will be at our front desk.  

## 2012-10-23 ENCOUNTER — Other Ambulatory Visit: Payer: Self-pay | Admitting: *Deleted

## 2012-10-23 MED ORDER — FLUOXETINE HCL 20 MG PO CAPS: 20.0000 mg | ORAL_CAPSULE | Freq: Every day | ORAL | Status: DC

## 2012-11-07 ENCOUNTER — Other Ambulatory Visit: Payer: Self-pay | Admitting: *Deleted

## 2012-11-07 MED ORDER — GLIPIZIDE 5 MG PO TABS: 5.0000 mg | ORAL_TABLET | Freq: Two times a day (BID) | ORAL | Status: DC

## 2012-11-21 ENCOUNTER — Other Ambulatory Visit: Payer: Self-pay

## 2012-11-21 MED ORDER — METHYLPHENIDATE HCL 5 MG PO TABS: 5.0000 mg | ORAL_TABLET | Freq: Two times a day (BID) | ORAL | Status: DC

## 2012-11-21 MED ORDER — INSULIN GLARGINE 100 UNIT/ML ~~LOC~~ SOLN: SUBCUTANEOUS | Status: DC

## 2012-11-21 NOTE — Telephone Encounter (Signed)
Pt left v/m requesting rx ritalin. Call when ready for pick up. Pt said having problem getting Lantus, pt got 4 vials on 10/19/12;spoke with Daffeny at [facility].Daffeny said she will get rx for Lantus where pt can pick up # 10 vials and Daffeny will call pt when ready for pick up.

## 2012-11-21 NOTE — Telephone Encounter (Signed)
rx sent to pharmacy by e-script Spoke with patient and advised results  Spoke with patient and advised rx ready for pick-up and it will be at the front desk.

## 2012-11-23 ENCOUNTER — Ambulatory Visit: Payer: Self-pay | Admitting: Internal Medicine

## 2012-11-24 ENCOUNTER — Encounter: Payer: Self-pay | Admitting: Internal Medicine

## 2012-11-28 ENCOUNTER — Other Ambulatory Visit: Payer: Self-pay | Admitting: Internal Medicine

## 2012-12-01 ENCOUNTER — Encounter: Payer: Self-pay | Admitting: Internal Medicine

## 2012-12-21 ENCOUNTER — Other Ambulatory Visit: Payer: Self-pay

## 2012-12-21 MED ORDER — METHYLPHENIDATE HCL 5 MG PO TABS: 5.0000 mg | ORAL_TABLET | Freq: Two times a day (BID) | ORAL | Status: DC

## 2012-12-21 NOTE — Telephone Encounter (Signed)
Pt left v/m requesting rx Ritalin. Pt did not want cb; pt said she would pick up on 12/22/12 in AM.

## 2012-12-25 ENCOUNTER — Other Ambulatory Visit: Payer: Self-pay | Admitting: *Deleted

## 2012-12-25 MED ORDER — GLIPIZIDE 5 MG PO TABS: 5.0000 mg | ORAL_TABLET | Freq: Two times a day (BID) | ORAL | Status: DC

## 2012-12-25 NOTE — Telephone Encounter (Signed)
This was already picked up by patient

## 2013-01-18 ENCOUNTER — Other Ambulatory Visit: Payer: Self-pay

## 2013-01-20 ENCOUNTER — Other Ambulatory Visit: Payer: Self-pay | Admitting: Internal Medicine

## 2013-01-30 ENCOUNTER — Other Ambulatory Visit: Payer: Self-pay | Admitting: *Deleted

## 2013-01-30 MED ORDER — GLUCOSE BLOOD VI STRP: ORAL_STRIP | Status: DC

## 2013-01-30 NOTE — Telephone Encounter (Signed)
Letter from [facility] to change pt's test strips to Onetouch from the accuchek, new rx sent to [facility]

## 2013-02-09 ENCOUNTER — Other Ambulatory Visit: Payer: Self-pay | Admitting: *Deleted

## 2013-02-09 MED ORDER — METHYLPHENIDATE HCL 5 MG PO TABS: 5.0000 mg | ORAL_TABLET | Freq: Two times a day (BID) | ORAL | Status: DC

## 2013-02-09 MED ORDER — INSULIN GLARGINE 100 UNIT/ML ~~LOC~~ SOLN: SUBCUTANEOUS | Status: DC

## 2013-02-09 NOTE — Telephone Encounter (Signed)
Px printed for pick up in IN box  

## 2013-02-09 NOTE — Telephone Encounter (Signed)
Pt notified Rx ready for pickup 

## 2013-02-26 ENCOUNTER — Other Ambulatory Visit: Payer: Self-pay | Admitting: Internal Medicine

## 2013-03-13 ENCOUNTER — Encounter: Payer: Self-pay | Admitting: Internal Medicine

## 2013-03-13 ENCOUNTER — Ambulatory Visit (INDEPENDENT_AMBULATORY_CARE_PROVIDER_SITE_OTHER): Payer: PRIVATE HEALTH INSURANCE | Admitting: Internal Medicine

## 2013-03-13 VITALS — BP 130/80 | HR 90 | Temp 98.6°F | Ht 65.0 in | Wt 244.0 lb

## 2013-03-13 DIAGNOSIS — E1149 Type 2 diabetes mellitus with other diabetic neurological complication: Secondary | ICD-10-CM

## 2013-03-13 DIAGNOSIS — E785 Hyperlipidemia, unspecified: Secondary | ICD-10-CM

## 2013-03-13 DIAGNOSIS — F329 Major depressive disorder, single episode, unspecified: Secondary | ICD-10-CM

## 2013-03-13 NOTE — Assessment & Plan Note (Signed)
Doing somewhat better Really needs basal insulin with high fasting sugars---but wants to hold off If over 9%, will set up with endocrine

## 2013-03-13 NOTE — Assessment & Plan Note (Signed)
Working on lifestyle 

## 2013-03-13 NOTE — Assessment & Plan Note (Signed)
Mood is stable She asks about stopping the ritalin---I don't recommend this

## 2013-03-13 NOTE — Progress Notes (Signed)
Pre-visit discussion using our clinic review tool. No additional management support is needed unless otherwise documented below in the visit note.  

## 2013-03-13 NOTE — Assessment & Plan Note (Signed)
On statin.

## 2013-03-13 NOTE — Patient Instructions (Signed)

## 2013-03-13 NOTE — Progress Notes (Signed)
Subjective:    Patient ID: Jacqueline Cole, female    DOB: [birth year], 62 y.o.   MRN: [id number]  HPI Doing okay now Still good days and bad days but more good Mood has been okay  Just got a puppy Now has to walk every day with her Has been trying to eat better Weight is down 6#--probably just in the past month Checks sugars twice a day---discussed that once a day is the most fastings are still high--- 175 Some burning and needle sensations in feet--not so bad that she needs meds  No chest pain Breathing has been fine No edema  Current Outpatient Prescriptions on File Prior to Visit  Medication Sig Dispense Refill  . BIOTIN PO Take by mouth daily.        . fish oil-omega-3 fatty acids 1000 MG capsule Take 1 g by mouth daily.        Marland Kitchen FLUoxetine (PROZAC) 20 MG capsule TAKE 1 CAPSULE (20 MG TOTAL) BY MOUTH DAILY.  90 capsule  0  . glipiZIDE (GLUCOTROL) 5 MG tablet Take 1 tablet (5 mg total) by mouth 2 (two) times daily before a meal.  180 tablet  1  . glucose blood (ONE TOUCH TEST STRIPS) test strip Use as instructed to test blood sugar 3 times daily dx: 250.00  300 each  3  . insulin glargine (LANTUS) 100 UNIT/ML injection Inject 50 units in the morning and 40 units in the evening dx:250.62  9 vial  1  . Insulin Syringe-Needle U-100 25G X 5/8" 1 ML MISC by Does not apply route. Use daily as directed (may substitute for insurance)       . JANUVIA 100 MG tablet TAKE 1 TABLET BY MOUTH DAILY.  90 tablet  3  . metFORMIN (GLUCOPHAGE) 1000 MG tablet TAKE 1 TABLET BY MOUTH 2 TIMES DAILY.  180 tablet  3  . methylphenidate (RITALIN) 5 MG tablet Take 1 tablet (5 mg total) by mouth 2 (two) times daily.  60 tablet  0  . Multiple Vitamin (MULTIVITAMIN PO) Take by mouth.        . pravastatin (PRAVACHOL) 80 MG tablet TAKE 1 TABLET (80 MG TOTAL) BY MOUTH DAILY.  90 tablet  3  . venlafaxine XR (EFFEXOR-XR) 150 MG 24 hr capsule Take 1 capsule (150 mg total) by mouth 2 (two) times daily.  180 capsule   3   No current facility-administered medications on file prior to visit.    No Known Allergies  Past Medical History  Diagnosis Date  . Depression   . Diabetes mellitus type II   . Hyperlipemia   . Obesity   . Syncope 1998    DM diagnosis    Past Surgical History  Procedure Laterality Date  . Cholecystectomy  1975  . Cosmetic surgery      Injury Right face as a child  . Vaginal delivery      X 2  . Abdominal hysterectomy  ~2005    and BSO    Family History  Problem Relation Age of Onset  . Stroke Father     CVA  . Heart disease Father     MI  . Cancer Sister     breast cancer  . Heart disease Brother     MI  . Cancer Brother     Lung    History   Social History  . Marital Status: Married    Spouse Name: N/A    Number of Children:  2  . Years of Education: N/A   Occupational History  .      "Just up and quit" at [employer]   Social History Main Topics  . Smoking status: Former Smoker    Quit date: 03/15/1993  . Smokeless tobacco: Never Used  . Alcohol Use: No  . Drug Use: Not on file  . Sexual Activity: Not on file   Other Topics Concern  . Not on file   Social History Narrative  . No narrative on file   Review of Systems Sleeps great Appetite is fine Bowels are fine    Objective:   Physical Exam  Constitutional: She appears well-developed and well-nourished. No distress.  Neck: Neck supple. No thyromegaly present.  Cardiovascular: Normal rate, regular rhythm, normal heart sounds and intact distal pulses.  Exam reveals no gallop.   No murmur heard. Pulmonary/Chest: Effort normal and breath sounds normal. No respiratory distress. She has no wheezes. She has no rales.  Musculoskeletal: She exhibits no edema and no tenderness.  Lymphadenopathy:    She has no cervical adenopathy.  Skin:  Slight plantar callous No ulcers or open areas in feet  Psychiatric: She has a normal mood and affect. Her behavior is normal.          Assessment  & Plan:

## 2013-03-29 ENCOUNTER — Other Ambulatory Visit: Payer: Self-pay | Admitting: *Deleted

## 2013-03-29 NOTE — Telephone Encounter (Signed)
Patient called to ask for Rx for Ritalin.  Please call when ready for pick up.

## 2013-03-30 MED ORDER — METHYLPHENIDATE HCL 5 MG PO TABS: 5.0000 mg | ORAL_TABLET | Freq: Two times a day (BID) | ORAL | Status: DC

## 2013-03-30 NOTE — Telephone Encounter (Signed)
Left message on machine that rx is ready for pick-up, and it will be at our front desk.  

## 2013-04-25 ENCOUNTER — Other Ambulatory Visit: Payer: Self-pay | Admitting: Internal Medicine

## 2013-05-07 ENCOUNTER — Other Ambulatory Visit: Payer: Self-pay | Admitting: Internal Medicine

## 2013-05-07 MED ORDER — METHYLPHENIDATE HCL 5 MG PO TABS: 5.0000 mg | ORAL_TABLET | Freq: Two times a day (BID) | ORAL | Status: DC

## 2013-05-07 NOTE — Telephone Encounter (Signed)
Pt is needing refill on her Ritalin. Please advise

## 2013-05-07 NOTE — Telephone Encounter (Signed)
Left message on machine that rx is ready for pick-up, and it will be at our front desk.  

## 2013-05-14 ENCOUNTER — Other Ambulatory Visit: Payer: Self-pay | Admitting: Internal Medicine

## 2013-06-27 ENCOUNTER — Other Ambulatory Visit: Payer: Self-pay

## 2013-06-27 MED ORDER — METHYLPHENIDATE HCL 5 MG PO TABS: 5.0000 mg | ORAL_TABLET | Freq: Two times a day (BID) | ORAL | Status: DC

## 2013-06-27 NOTE — Telephone Encounter (Signed)
Spoke with patient and advised rx ready for pick-up and it will be at the front desk.  

## 2013-06-27 NOTE — Telephone Encounter (Signed)
Pt left v/m requesting rx ritalin. Call when ready for pick up. Dr Silvio Pate out of office  Until 07/02/13.

## 2013-08-01 ENCOUNTER — Other Ambulatory Visit: Payer: Self-pay

## 2013-08-01 ENCOUNTER — Other Ambulatory Visit: Payer: Self-pay | Admitting: Internal Medicine

## 2013-08-01 MED ORDER — METHYLPHENIDATE HCL 5 MG PO TABS: 5.0000 mg | ORAL_TABLET | Freq: Two times a day (BID) | ORAL | Status: DC

## 2013-08-01 NOTE — Telephone Encounter (Signed)
Spoke with patient and advised rx ready for pick-up and it will be at the front desk.  

## 2013-08-01 NOTE — Telephone Encounter (Signed)
Pt left v/m requesting rx ritalin. Call when ready for pick up. 

## 2013-08-16 ENCOUNTER — Other Ambulatory Visit: Payer: Self-pay | Admitting: Internal Medicine

## 2013-09-25 ENCOUNTER — Encounter: Payer: Self-pay | Admitting: Internal Medicine

## 2013-09-25 ENCOUNTER — Encounter: Payer: Self-pay | Admitting: Radiology

## 2013-09-25 ENCOUNTER — Ambulatory Visit (INDEPENDENT_AMBULATORY_CARE_PROVIDER_SITE_OTHER): Payer: PRIVATE HEALTH INSURANCE | Admitting: Internal Medicine

## 2013-09-25 VITALS — BP 128/80 | HR 85 | Temp 97.8°F | Ht 65.0 in | Wt 238.0 lb

## 2013-09-25 DIAGNOSIS — E1142 Type 2 diabetes mellitus with diabetic polyneuropathy: Secondary | ICD-10-CM

## 2013-09-25 DIAGNOSIS — Z0000 Encounter for general adult medical examination without abnormal findings: Secondary | ICD-10-CM

## 2013-09-25 DIAGNOSIS — M25469 Effusion, unspecified knee: Secondary | ICD-10-CM

## 2013-09-25 DIAGNOSIS — M25461 Effusion, right knee: Secondary | ICD-10-CM

## 2013-09-25 DIAGNOSIS — F329 Major depressive disorder, single episode, unspecified: Secondary | ICD-10-CM

## 2013-09-25 DIAGNOSIS — Z1211 Encounter for screening for malignant neoplasm of colon: Secondary | ICD-10-CM

## 2013-09-25 DIAGNOSIS — F3289 Other specified depressive episodes: Secondary | ICD-10-CM

## 2013-09-25 DIAGNOSIS — E1149 Type 2 diabetes mellitus with other diabetic neurological complication: Secondary | ICD-10-CM

## 2013-09-25 LAB — COMPREHENSIVE METABOLIC PANEL
ALBUMIN: 4.1 g/dL (ref 3.5–5.2)
ALK PHOS: 76 U/L (ref 39–117)
ALT: 25 U/L (ref 0–35)
AST: 45 U/L — ABNORMAL HIGH (ref 0–37)
BUN: 10 mg/dL (ref 6–23)
CALCIUM: 9.9 mg/dL (ref 8.4–10.5)
CHLORIDE: 100 meq/L (ref 96–112)
CO2: 28 mEq/L (ref 19–32)
Creatinine, Ser: 1 mg/dL (ref 0.4–1.2)
GFR: 61.05 mL/min (ref >60.00)
Glucose, Bld: 237 mg/dL — ABNORMAL HIGH (ref 70–99)
POTASSIUM: 4.2 meq/L (ref 3.5–5.1)
Sodium: 137 mEq/L (ref 135–145)
Total Bilirubin: 1.3 mg/dL — ABNORMAL HIGH (ref 0.2–1.2)
Total Protein: 7.2 g/dL (ref 6.0–8.3)

## 2013-09-25 LAB — LIPID PANEL
Cholesterol: 164 mg/dL (ref 0–200)
HDL: 36.9 mg/dL — ABNORMAL LOW (ref >39.00)
LDL CALC: 76 mg/dL (ref 0–99)
NonHDL: 127.1
Total CHOL/HDL Ratio: 4
Triglycerides: 256 mg/dL — ABNORMAL HIGH (ref 0.0–149.0)
VLDL: 51.2 mg/dL — ABNORMAL HIGH (ref 0.0–40.0)

## 2013-09-25 LAB — CBC WITH DIFFERENTIAL/PLATELET
BASOS PCT: 0.3 % (ref 0.0–3.0)
Basophils Absolute: 0 10*3/uL (ref 0.0–0.1)
EOS ABS: 0.2 10*3/uL (ref 0.0–0.7)
Eosinophils Relative: 2.1 % (ref 0.0–5.0)
HCT: 43 % (ref 36.0–46.0)
Hemoglobin: 14.4 g/dL (ref 12.0–15.0)
LYMPHS PCT: 22.9 % (ref 12.0–46.0)
Lymphs Abs: 1.9 10*3/uL (ref 0.7–4.0)
MCHC: 33.4 g/dL (ref 30.0–36.0)
MCV: 89.5 fl (ref 78.0–100.0)
MONO ABS: 0.5 10*3/uL (ref 0.1–1.0)
Monocytes Relative: 6.1 % (ref 3.0–12.0)
NEUTROS PCT: 68.6 % (ref 43.0–77.0)
Neutro Abs: 5.7 10*3/uL (ref 1.4–7.7)
PLATELETS: 236 10*3/uL (ref 150.0–400.0)
RBC: 4.8 Mil/uL (ref 3.87–5.11)
RDW: 15.7 % — AB (ref 11.5–15.5)
WBC: 8.4 10*3/uL (ref 4.0–10.5)

## 2013-09-25 LAB — HEMOGLOBIN A1C: HEMOGLOBIN A1C: 9.1 % — AB (ref 4.6–6.5)

## 2013-09-25 MED ORDER — METHYLPHENIDATE HCL 5 MG PO TABS: 5.0000 mg | ORAL_TABLET | Freq: Two times a day (BID) | ORAL | Status: DC

## 2013-09-25 NOTE — Assessment & Plan Note (Signed)
No obvious meniscus or ligament damage Really limiting her Will set up with Dr Lorelei Pont

## 2013-09-25 NOTE — Assessment & Plan Note (Signed)
Not severe enough to require meds

## 2013-09-25 NOTE — Progress Notes (Signed)
Subjective:    Patient ID: Jacqueline Cole, female    DOB: 1951-01-23, 63 y.o.   MRN: [id number]  HPI Doing "okay"  Trouble with right knee--seems to pop out at times One major pop in April--increased pain since then Takes aleve nightly---this has helped Not walking dog as much---had gotten up to 2 miles per day  Not checking sugars No apparent low sugar reactions Still has the burning, needle pains in feet Uses 50 bid of the lantus  Mood has been okay No regular depressed mood  Current Outpatient Prescriptions on File Prior to Visit  Medication Sig Dispense Refill  . BIOTIN PO Take by mouth daily.        . fish oil-omega-3 fatty acids 1000 MG capsule Take 1 g by mouth daily.        Marland Kitchen FLUoxetine (PROZAC) 20 MG capsule TAKE ONE CAPSULE BY MOUTH EVERY DAY  90 capsule  0  . glipiZIDE (GLUCOTROL) 5 MG tablet Take 1 tablet (5 mg total) by mouth 2 (two) times daily before a meal.  180 tablet  1  . glucose blood (ONE TOUCH TEST STRIPS) test strip Use as instructed to test blood sugar 3 times daily dx: 250.00  300 each  3  . Insulin Syringe-Needle U-100 25G X 5/8" 1 ML MISC by Does not apply route. Use daily as directed (may substitute for insurance)       . JANUVIA 100 MG tablet TAKE 1 TABLET BY MOUTH DAILY.  90 tablet  3  . LANTUS 100 UNIT/ML injection INJECT TWICE DAILY AS DIRECTED (10 VIALS = 90 DAY SUPPLY)  100 mL  1  . metFORMIN (GLUCOPHAGE) 1000 MG tablet TAKE 1 TABLET BY MOUTH 2 TIMES DAILY.  180 tablet  3  . methylphenidate (RITALIN) 5 MG tablet Take 1 tablet (5 mg total) by mouth 2 (two) times daily.  60 tablet  0  . Multiple Vitamin (MULTIVITAMIN PO) Take by mouth.        . pravastatin (PRAVACHOL) 80 MG tablet TAKE 1 TABLET (80 MG TOTAL) BY MOUTH DAILY.  90 tablet  3  . venlafaxine XR (EFFEXOR-XR) 150 MG 24 hr capsule Take 1 capsule (150 mg total) by mouth 2 (two) times daily.  180 capsule  3   No current facility-administered medications on file prior to visit.    No Known  Allergies  Past Medical History  Diagnosis Date  . Depression   . Diabetes mellitus type II   . Hyperlipemia   . Obesity   . Syncope 1998    DM diagnosis    Past Surgical History  Procedure Laterality Date  . Cholecystectomy  1975  . Cosmetic surgery      Injury Right face as a child  . Vaginal delivery      X 2  . Abdominal hysterectomy  ~2005    and BSO    Family History  Problem Relation Age of Onset  . Stroke Father     CVA  . Heart disease Father     MI  . Cancer Sister     breast cancer  . Heart disease Brother     MI  . Cancer Brother     Lung    History   Social History  . Marital Status: Married    Spouse Name: N/A    Number of Children: 2  . Years of Education: N/A   Occupational History  .      "Just up and  quit" at [employer] Topics  . Smoking status: Former Smoker    Quit date: 03/15/1993  . Smokeless tobacco: Never Used  . Alcohol Use: No  . Drug Use: Not on file  . Sexual Activity: Not on file   Other Topics Concern  . Not on file   Social History Narrative  . No narrative on file   Review of Systems  Constitutional: Negative for fatigue and unexpected weight change.       Has lost another few pounds Wears seat belt  HENT: Positive for hearing loss and tinnitus.        Some trouble hearing in church Full dentures  Eyes: Negative for visual disturbance.       Needs glasses all the time now No diplopia or unilateral vision loss  Respiratory: Negative for cough, chest tightness and shortness of breath.   Cardiovascular: Positive for palpitations. Negative for chest pain.       Rare brief palpitations   Gastrointestinal: Negative for nausea, vomiting, abdominal pain, constipation and blood in stool.       Heartburn better since more water and less crystal light  Endocrine: Negative for cold intolerance and heat intolerance.  Genitourinary: Negative for dysuria, hematuria and difficulty urinating.    Musculoskeletal: Positive for arthralgias. Negative for back pain and joint swelling.  Skin: Negative for rash.       No suspicious lesions  Allergic/Immunologic: Positive for environmental allergies. Negative for immunocompromised state.  Neurological: Positive for numbness. Negative for dizziness, syncope, weakness, light-headedness and headaches.  Hematological: Negative for adenopathy. Does not bruise/bleed easily.  Psychiatric/Behavioral: Negative for sleep disturbance and dysphoric mood. The patient is nervous/anxious.        Rare panic attack--she just stays home and it passes       Objective:   Physical Exam  Constitutional: She appears well-developed and well-nourished. No distress.  HENT:  Head: Normocephalic and atraumatic.  Right Ear: External ear normal.  Left Ear: External ear normal.  Mouth/Throat: Oropharynx is clear and moist. No oropharyngeal exudate.  Eyes: Conjunctivae and EOM are normal. Pupils are equal, round, and reactive to light.  Neck: Normal range of motion. Neck supple. No thyromegaly present.  Cardiovascular: Normal rate, regular rhythm and normal heart sounds.  Exam reveals no gallop.   No murmur heard. Faint pedal pulses  Pulmonary/Chest: Effort normal and breath sounds normal. No respiratory distress. She has no wheezes. She has no rales.  Abdominal: Soft. There is no tenderness.  Genitourinary:  Mild cystic changes in both breasts  Musculoskeletal: She exhibits no edema.  Mild effusion in right knee No ligament or meniscus damage apparent  Lymphadenopathy:    She has no cervical adenopathy.  Skin: No rash noted. No erythema.  No foot lesions  Psychiatric: She has a normal mood and affect. Her behavior is normal.          Assessment & Plan:

## 2013-09-25 NOTE — Patient Instructions (Signed)
DASH Eating Plan  DASH stands for "Dietary Approaches to Stop Hypertension." The DASH eating plan is a healthy eating plan that has been shown to reduce high blood pressure (hypertension). Additional health benefits may include reducing the risk of type 2 diabetes mellitus, heart disease, and stroke. The DASH eating plan may also help with weight loss.  WHAT DO I NEED TO KNOW ABOUT THE DASH EATING PLAN?  For the DASH eating plan, you will follow these general guidelines:  · Choose foods with a percent daily value for sodium of less than 5% (as listed on the food label).  · Use salt-free seasonings or herbs instead of table salt or sea salt.  · Check with your health care provider or pharmacist before using salt substitutes.  · Eat lower-sodium products, often labeled as "lower sodium" or "no salt added."  · Eat fresh foods.  · Eat more vegetables, fruits, and low-fat dairy products.  · Choose whole grains. Look for the word "whole" as the first word in the ingredient list.  · Choose fish and skinless chicken or turkey more often than red meat. Limit fish, poultry, and meat to 6 oz (170 g) each day.  · Limit sweets, desserts, sugars, and sugary drinks.  · Choose heart-healthy fats.  · Limit cheese to 1 oz (28 g) per day.  · Eat more home-cooked food and less restaurant, buffet, and fast food.  · Limit fried foods.  · Cook foods using methods other than frying.  · Limit canned vegetables. If you do use them, rinse them well to decrease the sodium.  · When eating at a restaurant, ask that your food be prepared with less salt, or no salt if possible.  WHAT FOODS CAN I EAT?  Seek help from a dietitian for individual calorie needs.  Grains  Whole grain or whole wheat bread. Brown rice. Whole grain or whole wheat pasta. Quinoa, bulgur, and whole grain cereals. Low-sodium cereals. Corn or whole wheat flour tortillas. Whole grain cornbread. Whole grain crackers. Low-sodium crackers.  Vegetables  Fresh or frozen vegetables  (raw, steamed, roasted, or grilled). Low-sodium or reduced-sodium tomato and vegetable juices. Low-sodium or reduced-sodium tomato sauce and paste. Low-sodium or reduced-sodium canned vegetables.   Fruits  All fresh, canned (in natural juice), or frozen fruits.  Meat and Other Protein Products  Ground beef (85% or leaner), grass-fed beef, or beef trimmed of fat. Skinless chicken or turkey. Ground chicken or turkey. Pork trimmed of fat. All fish and seafood. Eggs. Dried beans, peas, or lentils. Unsalted nuts and seeds. Unsalted canned beans.  Dairy  Low-fat dairy products, such as skim or 1% milk, 2% or reduced-fat cheeses, low-fat ricotta or cottage cheese, or plain low-fat yogurt. Low-sodium or reduced-sodium cheeses.  Fats and Oils  Tub margarines without trans fats. Light or reduced-fat mayonnaise and salad dressings (reduced sodium). Avocado. Safflower, olive, or canola oils. Natural peanut or almond butter.  Other  Unsalted popcorn and pretzels.  The items listed above may not be a complete list of recommended foods or beverages. Contact your dietitian for more options.  WHAT FOODS ARE NOT RECOMMENDED?  Grains  White bread. White pasta. White rice. Refined cornbread. Bagels and croissants. Crackers that contain trans fat.  Vegetables  Creamed or fried vegetables. Vegetables in a cheese sauce. Regular canned vegetables. Regular canned tomato sauce and paste. Regular tomato and vegetable juices.  Fruits  Dried fruits. Canned fruit in light or heavy syrup. Fruit juice.  Meat and Other Protein   Products  Fatty cuts of meat. Ribs, chicken wings, bacon, sausage, bologna, salami, chitterlings, fatback, hot dogs, bratwurst, and packaged luncheon meats. Salted nuts and seeds. Canned beans with salt.  Dairy  Whole or 2% milk, cream, half-and-half, and cream cheese. Whole-fat or sweetened yogurt. Full-fat cheeses or blue cheese. Nondairy creamers and whipped toppings. Processed cheese, cheese spreads, or cheese  curds.  Condiments  Onion and garlic salt, seasoned salt, table salt, and sea salt. Canned and packaged gravies. Worcestershire sauce. Tartar sauce. Barbecue sauce. Teriyaki sauce. Soy sauce, including reduced sodium. Steak sauce. Fish sauce. Oyster sauce. Cocktail sauce. Horseradish. Ketchup and mustard. Meat flavorings and tenderizers. Bouillon cubes. Hot sauce. Tabasco sauce. Marinades. Taco seasonings. Relishes.  Fats and Oils  Butter, stick margarine, lard, shortening, ghee, and bacon fat. Coconut, palm kernel, or palm oils. Regular salad dressings.  Other  Pickles and olives. Salted popcorn and pretzels.  The items listed above may not be a complete list of foods and beverages to avoid. Contact your dietitian for more information.  WHERE CAN I FIND MORE INFORMATION?  National Heart, Lung, and [employer] Institute: www.nhlbi.nih.gov/health/health-topics/topics/dash/  Document Released: 02/18/2011 Document Revised: 03/06/2013 Document Reviewed: 01/03/2013  Exit[employer]® Patient Information ©2015 [employer]. This information is not intended to replace advice given to you by your health care provider. Make sure you discuss any questions you have with your health care provider.

## 2013-09-25 NOTE — Assessment & Plan Note (Signed)
Hopefully better Will increase lantus if still >>8%

## 2013-09-25 NOTE — Assessment & Plan Note (Signed)
Breast exam done---mammo due 2016 Flu shots every fall Fecal immunoassay UTD otherwise

## 2013-09-25 NOTE — Progress Notes (Signed)
Pre visit review using our clinic review tool, if applicable. No additional management support is needed unless otherwise documented below in the visit note. 

## 2013-09-25 NOTE — Assessment & Plan Note (Signed)
Diet info given Will look into knee issue so she can increase exercise again

## 2013-09-25 NOTE — Assessment & Plan Note (Signed)
Controlled on current regimen No wean indicated

## 2013-09-26 LAB — T4, FREE: FREE T4: 0.87 ng/dL (ref 0.60–1.60)

## 2013-10-01 ENCOUNTER — Ambulatory Visit (INDEPENDENT_AMBULATORY_CARE_PROVIDER_SITE_OTHER): Payer: PRIVATE HEALTH INSURANCE | Admitting: Family Medicine

## 2013-10-01 ENCOUNTER — Encounter: Payer: Self-pay | Admitting: Family Medicine

## 2013-10-01 ENCOUNTER — Ambulatory Visit (INDEPENDENT_AMBULATORY_CARE_PROVIDER_SITE_OTHER)
Admission: RE | Admit: 2013-10-01 | Discharge: 2013-10-01 | Disposition: A | Discharge status: Home / Self Care | Payer: PRIVATE HEALTH INSURANCE | Source: Ambulatory Visit | Attending: Family Medicine | Admitting: Family Medicine

## 2013-10-01 VITALS — BP 130/70 | HR 95 | Temp 98.4°F | Ht 65.0 in | Wt 238.2 lb

## 2013-10-01 DIAGNOSIS — M25569 Pain in unspecified knee: Secondary | ICD-10-CM

## 2013-10-01 DIAGNOSIS — M2390 Unspecified internal derangement of unspecified knee: Secondary | ICD-10-CM

## 2013-10-01 DIAGNOSIS — M25561 Pain in right knee: Secondary | ICD-10-CM

## 2013-10-01 DIAGNOSIS — E1149 Type 2 diabetes mellitus with other diabetic neurological complication: Secondary | ICD-10-CM

## 2013-10-01 DIAGNOSIS — M2391 Unspecified internal derangement of right knee: Secondary | ICD-10-CM

## 2013-10-01 NOTE — Progress Notes (Signed)
[place] [address] Phone: [phone] Fax: [phone]  Patient ID: [name] MRN: [id number], DOB: 09-17-1950, 63 y.o. Date of Encounter: 10/01/2013  Primary Physician:  [name], MD   Chief Complaint: Knee Pain   Subjective:   History of Present Illness:  Jacqueline Cole is a 63 y.o. very pleasant female patient who presents with the following:  R knee, started to bother her more since march.  All the R side.   Patient presents with 5 mo h/o R sided knee pain after no clear injury. No audible pop was heard. The patient has had minimal effusion. + symptomatic giving-way. No mechanical clicking. Joint has not locked up. Patient has been able to walk but is limping. The patient does have pain going up and down stairs or rising from a seated position.   Pain location: all over Current physical activity: none Prior Knee Surgery: none Current pain meds: alleve Bracing: none  a1c is 9.1.  Past Medical History, Surgical History, Social History, Family History, Problem List, Medications, and Allergies have been reviewed and updated if relevant.  Review of Systems:  GEN: No fevers, chills. Nontoxic. Primarily MSK c/o today. MSK: Detailed in the HPI GI: tolerating PO intake without difficulty Neuro: No numbness, parasthesias, or tingling associated. Otherwise the pertinent positives of the ROS are noted above.   Objective:   Physical Examination: BP 130/70  Pulse 95  Temp(Src) 98.4 F (36.9 C) (Oral)  Ht 5\' 5"  (1.651 m)  Wt 238 lb 4 oz (108.069 kg)  BMI 39.65 kg/m2   GEN: WDWN, NAD, Non-toxic, Alert & Oriented x 3 HEENT: Atraumatic, Normocephalic.  Ears and Nose: No external deformity. EXTR: No clubbing/cyanosis/edema NEURO: Normal gait.  PSYCH: Normally interactive. Conversant. Not depressed or anxious appearing.  Calm demeanor.   Knee:  R Gait: Normal heel toe pattern ROM: +2 to 110 Effusion: mild Echymosis or edema:  none Patellar tendon NT Painful PLICA: neg Patellar grind: negative Medial and lateral patellar facet loading: negative medial and lateral joint lines:TTP Mcmurray's pain Flexion-pinch pain Varus and valgus stress: stable Lachman: neg Ant and Post drawer: neg Hip abduction, IR, ER: WNL Hip flexion str: 5/5 Hip abd: 5/5 Quad: 5/5 VMO atrophy:No Hamstring concentric and eccentric: 5/5   Radiology: Dg Knee 4 Views W/patella Right  10/01/2013   CLINICAL DATA:  Right knee pain  EXAM: RIGHT KNEE - COMPLETE 4+ VIEW  COMPARISON:  None.  FINDINGS: Moderate narrowing and mild osteophyte formation of the medial compartment, with narrowing accentuated on the weight-bearing view. Mild narrowing and osteophyte formation of the patellofemoral compartment. No fracture or joint effusion.  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   Electronically Signed   By: Skipper Cliche M.D.   On: 10/01/2013 10:26    Assessment & Plan:   Derangement, knee internal, right  Knee pain, right - Plan: DG Knee 4 Views W/Patella Right, CANCELED: DG Knee 4 Views W/Patella Right  Type II or unspecified type diabetes mellitus with neurological manifestations, uncontrolled(250.62)  Moderate OA with probable flare, cannot exclude degenerative meniscal tear. Will treat this conservatively. Continue with oral anti-inflammatories. Basic knee brace. Inject knee with corticosteroid and reevaluate.  Knee Injection, RIGHT Patient verbally consented to procedure. Risks (including potential rare risk of infection), benefits, and alternatives explained. Sterilely prepped with Chloraprep. Ethyl cholride used for anesthesia. 8 cc Lidocaine 1% mixed with Depo-Medrol 60 mg injected using the anteromedial approach without difficulty. No complications with procedure  and tolerated well. Patient had decreased pain post-injection.   New Prescriptions   No medications on file   Modified Medications    No medications on file   Orders Placed This Encounter  Procedures  . DG Knee 4 Views W/Patella Right   Follow-up: Return in about 6 weeks (around 11/12/2013). Unless noted above, the patient is to follow-up if symptoms worsen. Red flags were reviewed with the patient.  Signed,  [name]. Brianni Manthe, MD, CAQ Sports Medicine   Discontinued Medications   No medications on file   Current Medications at Discharge:   Medication List       This list is accurate as of: 10/01/13  2:34 PM.  Always use your most recent med list.               BIOTIN PO  Take by mouth daily.     fish oil-omega-3 fatty acids 1000 MG capsule  Take 1 g by mouth daily.     FLUoxetine 20 MG capsule  Commonly known as:  PROZAC  TAKE ONE CAPSULE BY MOUTH EVERY DAY     glipiZIDE 5 MG tablet  Commonly known as:  GLUCOTROL  Take 1 tablet (5 mg total) by mouth 2 (two) times daily before a meal.     glucose blood test strip  Commonly known as:  ONE TOUCH TEST STRIPS  Use as instructed to test blood sugar 3 times daily dx: 250.00     Insulin Syringe-Needle U-100 25G X 5/8" 1 ML Misc  by Does not apply route. Use daily as directed (may substitute for insurance)     JANUVIA 100 MG tablet  Generic drug:  sitaGLIPtin  TAKE 1 TABLET BY MOUTH DAILY.     LANTUS 100 UNIT/ML injection  Generic drug:  insulin glargine  INJECT TWICE DAILY AS DIRECTED (10 VIALS = 90 DAY SUPPLY)     metFORMIN 1000 MG tablet  Commonly known as:  GLUCOPHAGE  TAKE 1 TABLET BY MOUTH 2 TIMES DAILY.     methylphenidate 5 MG tablet  Commonly known as:  RITALIN  Take 1 tablet (5 mg total) by mouth 2 (two) times daily.     MULTIVITAMIN PO  Take by mouth.     pravastatin 80 MG tablet  Commonly known as:  PRAVACHOL  TAKE 1 TABLET (80 MG TOTAL) BY MOUTH DAILY.     venlafaxine XR 150 MG 24 hr capsule  Commonly known as:  EFFEXOR-XR  Take 1 capsule (150 mg total) by mouth 2 (two) times daily.

## 2013-10-01 NOTE — Progress Notes (Signed)
Pre visit review using our clinic review tool, if applicable. No additional management support is needed unless otherwise documented below in the visit note. 

## 2013-10-15 ENCOUNTER — Other Ambulatory Visit (INDEPENDENT_AMBULATORY_CARE_PROVIDER_SITE_OTHER): Payer: PRIVATE HEALTH INSURANCE

## 2013-10-15 DIAGNOSIS — Z1211 Encounter for screening for malignant neoplasm of colon: Secondary | ICD-10-CM

## 2013-10-15 LAB — FECAL OCCULT BLOOD, IMMUNOCHEMICAL: Fecal Occult Bld: NEGATIVE

## 2013-10-29 ENCOUNTER — Other Ambulatory Visit: Payer: Self-pay | Admitting: Internal Medicine

## 2013-11-06 ENCOUNTER — Other Ambulatory Visit: Payer: Self-pay

## 2013-11-06 MED ORDER — METHYLPHENIDATE HCL 5 MG PO TABS: 5.0000 mg | ORAL_TABLET | Freq: Two times a day (BID) | ORAL | Status: DC

## 2013-11-06 NOTE — Telephone Encounter (Signed)
Pt left v/m requesting rx ritalin. Call when ready for pick up. 

## 2013-11-06 NOTE — Telephone Encounter (Signed)
Spoke with patient and advised rx ready for pick-up and it will be at the front desk.  

## 2013-11-10 ENCOUNTER — Other Ambulatory Visit: Payer: Self-pay | Admitting: Internal Medicine

## 2013-11-12 ENCOUNTER — Encounter: Payer: Self-pay | Admitting: Family Medicine

## 2013-11-12 ENCOUNTER — Ambulatory Visit (INDEPENDENT_AMBULATORY_CARE_PROVIDER_SITE_OTHER): Payer: PRIVATE HEALTH INSURANCE | Admitting: Family Medicine

## 2013-11-12 VITALS — BP 146/74 | HR 74 | Temp 98.3°F | Ht 65.0 in | Wt 242.0 lb

## 2013-11-12 DIAGNOSIS — M25561 Pain in right knee: Secondary | ICD-10-CM

## 2013-11-12 DIAGNOSIS — M25569 Pain in unspecified knee: Secondary | ICD-10-CM

## 2013-11-12 NOTE — Patient Instructions (Addendum)
Alleve 2 tabs by mouth two times a day over the counter: Take at least for 2 - 3 weeks. This is equal to a prescripton strength dose (GENERIC CHEAPER EQUIVALENT IS NAPROXEN SODIUM)   Take Tylenol/Acetaminophen ES (500mg ) 2 tabs by mouth three times a day max as needed.   Ice knee 20 minutes, twice a day for a week  Any type of linament that you want (Horse linament, biofreeze, etc.)

## 2013-11-12 NOTE — Progress Notes (Signed)
[place] [address] Phone: [phone] Fax: [phone]  Patient ID: Jacqueline Cole MRN: [id number], DOB: [birth year], 63 y.o. Date of Encounter: 11/12/2013  Primary Physician:  [name], MD   Chief Complaint: Follow-up   Subjective:   History of Present Illness:  Jacqueline Cole is a 63 y.o. very pleasant female patient who presents with the following:  She is doing much better. 2 days post-inj relief of symptoms. Able to do yardwork easily now. No pain. 1 alleve only in the last 6 weeks.  10/01/2013 Last OV with Owens Loffler, MD  R knee, started to bother her more since march.  All the R side.   Patient presents with 5 mo h/o R sided knee pain after no clear injury. No audible pop was heard. The patient has had minimal effusion. + symptomatic giving-way. No mechanical clicking. Joint has not locked up. Patient has been able to walk but is limping. The patient does have pain going up and down stairs or rising from a seated position.   Pain location: all over Current physical activity: none Prior Knee Surgery: none Current pain meds: alleve Bracing: none  a1c is 9.1.  Past Medical History, Surgical History, Social History, Family History, Problem List, Medications, and Allergies have been reviewed and updated if relevant.  Review of Systems:  GEN: No fevers, chills. Nontoxic. Primarily MSK c/o today. MSK: Detailed in the HPI GI: tolerating PO intake without difficulty Neuro: No numbness, parasthesias, or tingling associated. Otherwise the pertinent positives of the ROS are noted above.   Objective:   Physical Examination: BP 146/74  Pulse 74  Temp(Src) 98.3 F (36.8 C) (Oral)  Ht 5\' 5"  (1.651 m)  Wt 242 lb (109.77 kg)  BMI 40.27 kg/m2   GEN: WDWN, NAD, Non-toxic, Alert & Oriented x 3 HEENT: Atraumatic, Normocephalic.  Ears and Nose: No external deformity. EXTR: No clubbing/cyanosis/edema NEURO: Normal gait.  PSYCH: Normally  interactive. Conversant. Not depressed or anxious appearing.  Calm demeanor.   Knee:  R Gait: Normal heel toe pattern ROM: 0-120 Effusion: none Echymosis or edema: none Patellar tendon NT Painful PLICA: neg Patellar grind: negative Medial and lateral patellar facet loading: negative medial and lateral joint lines: minimal tenderness Mcmurray's pain Flexion-pinch pain Varus and valgus stress: stable Lachman: neg Ant and Post drawer: neg Hip abduction, IR, ER: WNL Hip flexion str: 5/5 Hip abd: 5/5 Quad: 5/5 VMO atrophy:No Hamstring concentric and eccentric: 5/5   Radiology: Dg Knee 4 Views W/patella Right  10/01/2013   CLINICAL DATA:  Right knee pain  EXAM: RIGHT KNEE - COMPLETE 4+ VIEW  COMPARISON:  None.  FINDINGS: Moderate narrowing and mild osteophyte formation of the medial compartment, with narrowing accentuated on the weight-bearing view. Mild narrowing and osteophyte formation of the patellofemoral compartment. No fracture or joint effusion.  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   Electronically Signed   By: Skipper Cliche M.D.   On: 10/01/2013 10:26    Assessment & Plan:   Knee pain, right  Moderate OA with probable flare, did well with steroid inj  Patient Instructions  Alleve 2 tabs by mouth two times a day over the counter: Take at least for 2 - 3 weeks. This is equal to a prescripton strength dose (GENERIC CHEAPER EQUIVALENT IS NAPROXEN SODIUM)   Take Tylenol/Acetaminophen ES (500mg ) 2 tabs by mouth three times a day max as needed.   Ice knee 20 minutes, twice a day for  a week  Any type of linament that you want (Horse linament, biofreeze, etc.)     Signed,  Abdelaziz Westenberger T. Natajah Derderian, MD, CA[facility] Sports Medicine   Discontinued Medications   No medications on file   Current Medications at Discharge:   Medication List       This list is accurate as of: 11/12/13  8:43 AM.  Always use your most recent med list.                BIOTIN PO  Take by mouth daily.     fish oil-omega-3 fatty acids 1000 MG capsule  Take 1 g by mouth daily.     FLUoxetine 20 MG capsule  Commonly known as:  PROZAC  TAKE 1 CAPSULE BY MOUTH EVERY DAY     glipiZIDE 5 MG tablet  Commonly known as:  GLUCOTROL  Take 1 tablet (5 mg total) by mouth 2 (two) times daily before a meal.     glucose blood test strip  Commonly known as:  ONE TOUCH TEST STRIPS  Use as instructed to test blood sugar 3 times daily dx: 250.00     Insulin Syringe-Needle U-100 25G X 5/8" 1 ML Misc  by Does not apply route. Use daily as directed (may substitute for insurance)     JANUVIA 100 MG tablet  Generic drug:  sitaGLIPtin  TAKE 1 TABLET BY MOUTH DAILY.     LANTUS 100 UNIT/ML injection  Generic drug:  insulin glargine  INJECT TWICE DAILY AS DIRECTED (10 VIALS = 90 DAY SUPPLY)     metFORMIN 1000 MG tablet  Commonly known as:  GLUCOPHAGE  TAKE 1 TABLET BY MOUTH 2 TIMES DAILY.     methylphenidate 5 MG tablet  Commonly known as:  RITALIN  Take 1 tablet (5 mg total) by mouth 2 (two) times daily.     MULTIVITAMIN PO  Take by mouth.     pravastatin 80 MG tablet  Commonly known as:  PRAVACHOL  TAKE 1 TABLET (80 MG TOTAL) BY MOUTH DAILY.     venlafaxine XR 150 MG 24 hr capsule  Commonly known as:  EFFEXOR-XR  Take 1 capsule (150 mg total) by mouth 2 (two) times daily.

## 2013-11-12 NOTE — Progress Notes (Signed)
Pre visit review using our clinic review tool, if applicable. No additional management support is needed unless otherwise documented below in the visit note. 

## 2013-12-05 ENCOUNTER — Encounter: Payer: Self-pay | Admitting: Internal Medicine

## 2014-01-04 ENCOUNTER — Other Ambulatory Visit: Payer: Self-pay

## 2014-01-04 MED ORDER — METHYLPHENIDATE HCL 5 MG PO TABS: 5.0000 mg | ORAL_TABLET | Freq: Two times a day (BID) | ORAL | Status: DC

## 2014-01-04 NOTE — Telephone Encounter (Signed)
Pt left v/m requesting rx ritalin. Call when ready for pick up. 

## 2014-01-04 NOTE — Telephone Encounter (Signed)
Left message on machine that rx is ready for pick-up, and it will be at our front desk.  

## 2014-01-14 ENCOUNTER — Other Ambulatory Visit: Payer: Self-pay

## 2014-01-14 MED ORDER — GLIPIZIDE 5 MG PO TABS: 5.0000 mg | ORAL_TABLET | Freq: Two times a day (BID) | ORAL | Status: DC

## 2014-01-14 NOTE — Telephone Encounter (Signed)
Pt request refill glipizide 5 mg. Last filled for 6 months in 12/2012 but pt said she had extra and has only missed last 5 days. Please advise.

## 2014-01-25 ENCOUNTER — Other Ambulatory Visit: Payer: Self-pay | Admitting: Internal Medicine

## 2014-01-31 ENCOUNTER — Other Ambulatory Visit: Payer: Self-pay | Admitting: Internal Medicine

## 2014-03-12 ENCOUNTER — Other Ambulatory Visit: Payer: Self-pay

## 2014-03-12 MED ORDER — METHYLPHENIDATE HCL 5 MG PO TABS: 5.0000 mg | ORAL_TABLET | Freq: Two times a day (BID) | ORAL | Status: DC

## 2014-03-12 NOTE — Telephone Encounter (Signed)
Spoke with patient and advised rx ready for pick-up and it will be at the front desk.  

## 2014-03-12 NOTE — Telephone Encounter (Signed)
Pt left v/m requesting rx for Ritalin. Call when ready for pick up. Pt last had annual exam 09/25/13.

## 2014-03-20 ENCOUNTER — Encounter: Payer: Self-pay | Admitting: Internal Medicine

## 2014-03-26 ENCOUNTER — Ambulatory Visit: Payer: PRIVATE HEALTH INSURANCE | Admitting: Internal Medicine

## 2014-03-29 ENCOUNTER — Ambulatory Visit: Payer: PRIVATE HEALTH INSURANCE | Admitting: Internal Medicine

## 2014-04-26 ENCOUNTER — Other Ambulatory Visit: Payer: Self-pay | Admitting: Internal Medicine

## 2014-04-26 MED ORDER — FLUOXETINE HCL 20 MG PO CAPS: 20.0000 mg | ORAL_CAPSULE | Freq: Every day | ORAL | Status: DC

## 2014-04-26 MED ORDER — METHYLPHENIDATE HCL 5 MG PO TABS: 5.0000 mg | ORAL_TABLET | Freq: Two times a day (BID) | ORAL | Status: DC

## 2014-04-26 NOTE — Telephone Encounter (Signed)
Last f/u 09/2013

## 2014-04-26 NOTE — Telephone Encounter (Signed)
Fluoxetine sent in electronically

## 2014-04-26 NOTE — Telephone Encounter (Signed)
Spoke to pt and informed her Rx is available for pickup from the front desk. Pt advised third party unable to pickup 

## 2014-04-30 ENCOUNTER — Ambulatory Visit: Payer: PRIVATE HEALTH INSURANCE | Admitting: Internal Medicine

## 2014-04-30 ENCOUNTER — Other Ambulatory Visit: Payer: Self-pay | Admitting: Internal Medicine

## 2014-05-01 ENCOUNTER — Encounter: Payer: Self-pay | Admitting: Internal Medicine

## 2014-05-07 ENCOUNTER — Encounter: Payer: Self-pay | Admitting: Internal Medicine

## 2014-05-07 ENCOUNTER — Ambulatory Visit (INDEPENDENT_AMBULATORY_CARE_PROVIDER_SITE_OTHER): Payer: PRIVATE HEALTH INSURANCE | Admitting: Internal Medicine

## 2014-05-07 VITALS — BP 120/72 | HR 88 | Temp 98.3°F | Wt 232.8 lb

## 2014-05-07 DIAGNOSIS — E1149 Type 2 diabetes mellitus with other diabetic neurological complication: Secondary | ICD-10-CM

## 2014-05-07 DIAGNOSIS — E785 Hyperlipidemia, unspecified: Secondary | ICD-10-CM

## 2014-05-07 DIAGNOSIS — E1142 Type 2 diabetes mellitus with diabetic polyneuropathy: Secondary | ICD-10-CM

## 2014-05-07 DIAGNOSIS — IMO0002 Reserved for concepts with insufficient information to code with codable children: Secondary | ICD-10-CM

## 2014-05-07 DIAGNOSIS — F3341 Major depressive disorder, recurrent, in partial remission: Secondary | ICD-10-CM

## 2014-05-07 DIAGNOSIS — E1165 Type 2 diabetes mellitus with hyperglycemia: Secondary | ICD-10-CM

## 2014-05-07 DIAGNOSIS — E1141 Type 2 diabetes mellitus with diabetic mononeuropathy: Secondary | ICD-10-CM

## 2014-05-07 LAB — HEMOGLOBIN A1C: Hgb A1c MFr Bld: 9 % — ABNORMAL HIGH (ref 4.6–6.5)

## 2014-05-07 NOTE — Progress Notes (Signed)
Subjective:    Patient ID: Jacqueline Cole, female    DOB: April 20, 1950, 64 y.o.   MRN: [id number]  HPI Here for follow up on diabetes She did increase the insulin as directed She hasn't been checking her sugars Has eye exam in 2 days  Her dog was hit by car Still trying to walk 2 miles per day  Having a tough year Has handled this okay---depression hasn't acted up  Still with burning sensation in feet Only occasional  Continues on the statin No myalgias No GI problems  Current Outpatient Prescriptions on File Prior to Visit  Medication Sig Dispense Refill  . BIOTIN PO Take by mouth daily.      . fish oil-omega-3 fatty acids 1000 MG capsule Take 1 g by mouth daily.      Marland Kitchen FLUoxetine (PROZAC) 20 MG capsule Take 1 capsule (20 mg total) by mouth daily. 90 capsule 3  . glipiZIDE (GLUCOTROL) 5 MG tablet Take 1 tablet (5 mg total) by mouth 2 (two) times daily before a meal. 180 tablet 1  . glucose blood (ONE TOUCH TEST STRIPS) test strip Use as instructed to test blood sugar 3 times daily dx: 250.00 300 each 3  . Insulin Syringe-Needle U-100 25G X 5/8" 1 ML MISC by Does not apply route. Use daily as directed (may substitute for insurance)     . JANUVIA 100 MG tablet TAKE 1 TABLET BY MOUTH DAILY. 90 tablet 3  . LANTUS 100 UNIT/ML injection INJECT TWICE DAILY AS DIRECTED (10 VIALS = 90 DAY SUPPLY) (Patient taking differently: INJECT TWICE DAILY AS DIRECTED (10 VIALS = 90 DAY SUPPLY) 50 units two times a day) 100 mL 1  . metFORMIN (GLUCOPHAGE) 1000 MG tablet TAKE 1 TABLET BY MOUTH 2 TIMES DAILY. 180 tablet 3  . methylphenidate (RITALIN) 5 MG tablet Take 1 tablet (5 mg total) by mouth 2 (two) times daily. 60 tablet 0  . Multiple Vitamin (MULTIVITAMIN PO) Take by mouth.      . pravastatin (PRAVACHOL) 80 MG tablet TAKE 1 TABLET (80 MG TOTAL) BY MOUTH DAILY. 90 tablet 3  . venlafaxine XR (EFFEXOR-XR) 150 MG 24 hr capsule TAKE 1 CAPSULE TWICE DAILY 180 capsule 3   No current  facility-administered medications on file prior to visit.    No Known Allergies  Past Medical History  Diagnosis Date  . Depression   . Diabetes mellitus type II   . Hyperlipemia   . Obesity   . Syncope 1998    DM diagnosis    Past Surgical History  Procedure Laterality Date  . Cholecystectomy  1975  . Cosmetic surgery      Injury Right face as a child  . Vaginal delivery      X 2  . Abdominal hysterectomy  ~2005    and BSO    Family History  Problem Relation Age of Onset  . Stroke Father     CVA  . Heart disease Father     MI  . Cancer Sister     breast cancer  . Heart disease Brother     MI  . Cancer Brother     Lung    History   Social History  . Marital Status: Married    Spouse Name: N/A  . Number of Children: 2  . Years of Education: N/A   Occupational History  .      "Just up and quit" at [employer] Topics  .  Smoking status: Former Smoker    Quit date: 03/15/1993  . Smokeless tobacco: Never Used  . Alcohol Use: No  . Drug Use: No  . Sexual Activity: Not on file   Other Topics Concern  . Not on file   Social History Narrative   Review of Systems Husband losing job after 34 years-- [employer]. Is getting severance Sleeps well Weight is down 10# Knee is better since the cortisone shot in fall    Objective:   Physical Exam  Constitutional: She appears well-developed and well-nourished. No distress.  Neck: Normal range of motion. Neck supple. No thyromegaly present.  Cardiovascular: Normal rate, regular rhythm, normal heart sounds and intact distal pulses.  Exam reveals no gallop.   No murmur heard. Pulmonary/Chest: Effort normal and breath sounds normal. No respiratory distress. She has no wheezes. She has no rales.  Musculoskeletal: She exhibits no edema or tenderness.  Lymphadenopathy:    She has no cervical adenopathy.  Neurological:  Decreased sensation in feet  Skin:  No foot lesions  Psychiatric: She has a  normal mood and affect. Her behavior is normal.          Assessment & Plan:

## 2014-05-07 NOTE — Assessment & Plan Note (Signed)
Still doing okay despite husband losing job, etc Needs to continue the meds

## 2014-05-07 NOTE — Assessment & Plan Note (Signed)
Hopefully better since weight down despite higher insulin dose Goal under 8%, action if still over 9%

## 2014-05-07 NOTE — Assessment & Plan Note (Signed)
No problems with statin 

## 2014-05-07 NOTE — Progress Notes (Signed)
Pre visit review using our clinic review tool, if applicable. No additional management support is needed unless otherwise documented below in the visit note. 

## 2014-05-07 NOTE — Assessment & Plan Note (Signed)
Still with some burning No meds needed though

## 2014-05-09 ENCOUNTER — Other Ambulatory Visit: Payer: Self-pay | Admitting: Internal Medicine

## 2014-05-15 ENCOUNTER — Encounter: Payer: Self-pay | Admitting: Internal Medicine

## 2014-05-30 ENCOUNTER — Other Ambulatory Visit: Payer: Self-pay

## 2014-05-30 MED ORDER — METHYLPHENIDATE HCL 5 MG PO TABS: 5.0000 mg | ORAL_TABLET | Freq: Two times a day (BID) | ORAL | Status: DC

## 2014-05-30 NOTE — Telephone Encounter (Signed)
Pt left v/m requesting rx ritalin; call when ready for pick up.pt last seen 05/07/14.

## 2014-05-30 NOTE — Telephone Encounter (Signed)
Spoke with patient and advised rx ready for pick-up and it will be at the front desk.  

## 2014-07-16 ENCOUNTER — Other Ambulatory Visit: Payer: Self-pay | Admitting: Internal Medicine

## 2014-07-24 ENCOUNTER — Other Ambulatory Visit: Payer: Self-pay | Admitting: Internal Medicine

## 2014-07-29 ENCOUNTER — Other Ambulatory Visit: Payer: Self-pay | Admitting: Internal Medicine

## 2014-08-01 ENCOUNTER — Other Ambulatory Visit: Payer: Self-pay

## 2014-08-01 MED ORDER — METHYLPHENIDATE HCL 5 MG PO TABS: 5.0000 mg | ORAL_TABLET | Freq: Two times a day (BID) | ORAL | Status: DC

## 2014-08-01 NOTE — Telephone Encounter (Signed)
Pt request rx ritalin. Call when ready for pick up. rx last printed 05/30/14 and pt last seen 05/07/14. Pt also said when picked up lantus only got one vial of insulin. Spoke with Mickel Baas at [facility] and she said was put in as 10 ml not 10 vial and would change. Pt will contact [facility] about getting additional vials at no extra co pay cost.

## 2014-08-01 NOTE — Telephone Encounter (Signed)
Left message on machine that rx is ready for pick-up, and it will be at our front desk.  In our records here I put in 10 vials on 07/24/14 which it clearly states, error must be on the pharmacy side.

## 2014-08-09 ENCOUNTER — Telehealth: Payer: Self-pay

## 2014-08-09 NOTE — Telephone Encounter (Signed)
Pt left v/m cking on refills for Januvia; pt has refills available; spoke with Margarita Grizzle at [facility]; refills are available but she sent PA for Januvia on 08/08/14. Advised pt and she voiced understanding.

## 2014-08-26 MED ORDER — SAXAGLIPTIN HCL 5 MG PO TABS: 5.0000 mg | ORAL_TABLET | Freq: Every day | ORAL | Status: DC

## 2014-08-26 NOTE — Telephone Encounter (Signed)
Spoke with patient and advised results rx sent to pharmacy by e-script  

## 2014-08-26 NOTE — Telephone Encounter (Signed)
PA for Januvia has been denied, they are requesting that she try ONGLYZA. I spoke [facility] at [facility] and the cash price for Januvia is $439.00, she stated pt would need to try the 5 mg of ONGLYZA. Please advise denial forms on your desk

## 2014-08-26 NOTE — Telephone Encounter (Signed)
I am okay with that switch. Check with her---let her know that they are the same class and fairly equivalent

## 2014-09-23 ENCOUNTER — Other Ambulatory Visit: Payer: Self-pay

## 2014-09-23 MED ORDER — METHYLPHENIDATE HCL 5 MG PO TABS: 5.0000 mg | ORAL_TABLET | Freq: Two times a day (BID) | ORAL | Status: DC

## 2014-09-23 NOTE — Telephone Encounter (Signed)
Left message on machine that rx is ready for pick-up, and it will be at our front desk.  

## 2014-09-23 NOTE — Telephone Encounter (Signed)
Pt left v/m requesting rx ritalin. Call when ready for pick up. Last printed rx #60 on 08/01/14 and last seen 05/07/14.Please advise.

## 2014-09-26 ENCOUNTER — Other Ambulatory Visit: Payer: Self-pay | Admitting: Internal Medicine

## 2014-10-26 ENCOUNTER — Other Ambulatory Visit: Payer: Self-pay | Admitting: Internal Medicine

## 2014-11-06 ENCOUNTER — Ambulatory Visit (INDEPENDENT_AMBULATORY_CARE_PROVIDER_SITE_OTHER): Payer: BLUE CROSS/BLUE SHIELD | Admitting: Internal Medicine

## 2014-11-06 ENCOUNTER — Encounter: Payer: Self-pay | Admitting: Internal Medicine

## 2014-11-06 VITALS — BP 120/70 | HR 110 | Temp 98.2°F | Ht 65.0 in | Wt 232.0 lb

## 2014-11-06 DIAGNOSIS — F3340 Major depressive disorder, recurrent, in remission, unspecified: Secondary | ICD-10-CM | POA: Diagnosis not present

## 2014-11-06 DIAGNOSIS — Z0000 Encounter for general adult medical examination without abnormal findings: Secondary | ICD-10-CM | POA: Diagnosis not present

## 2014-11-06 DIAGNOSIS — E1141 Type 2 diabetes mellitus with diabetic mononeuropathy: Secondary | ICD-10-CM | POA: Diagnosis not present

## 2014-11-06 DIAGNOSIS — IMO0002 Reserved for concepts with insufficient information to code with codable children: Secondary | ICD-10-CM

## 2014-11-06 DIAGNOSIS — Z1211 Encounter for screening for malignant neoplasm of colon: Secondary | ICD-10-CM | POA: Diagnosis not present

## 2014-11-06 DIAGNOSIS — E1165 Type 2 diabetes mellitus with hyperglycemia: Secondary | ICD-10-CM

## 2014-11-06 DIAGNOSIS — E1149 Type 2 diabetes mellitus with other diabetic neurological complication: Secondary | ICD-10-CM

## 2014-11-06 LAB — COMPREHENSIVE METABOLIC PANEL
ALK PHOS: 82 U/L (ref 39–117)
ALT: 19 U/L (ref 0–35)
AST: 33 U/L (ref 0–37)
Albumin: 4.2 g/dL (ref 3.5–5.2)
BILIRUBIN TOTAL: 1.1 mg/dL (ref 0.2–1.2)
BUN: 8 mg/dL (ref 6–23)
CO2: 27 mEq/L (ref 19–32)
Calcium: 9.9 mg/dL (ref 8.4–10.5)
Chloride: 100 mEq/L (ref 96–112)
Creatinine, Ser: 1.07 mg/dL (ref 0.40–1.20)
GFR: 54.97 mL/min — ABNORMAL LOW (ref >60.00)
GLUCOSE: 266 mg/dL — AB (ref 70–99)
Potassium: 4.7 mEq/L (ref 3.5–5.1)
SODIUM: 138 meq/L (ref 135–145)
TOTAL PROTEIN: 7.1 g/dL (ref 6.0–8.3)

## 2014-11-06 LAB — CBC WITH DIFFERENTIAL/PLATELET
BASOS ABS: 0 10*3/uL (ref 0.0–0.1)
Basophils Relative: 0.4 % (ref 0.0–3.0)
Eosinophils Absolute: 0.1 10*3/uL (ref 0.0–0.7)
Eosinophils Relative: 0.9 % (ref 0.0–5.0)
HCT: 43.3 % (ref 36.0–46.0)
Hemoglobin: 14.4 g/dL (ref 12.0–15.0)
LYMPHS ABS: 1.8 10*3/uL (ref 0.7–4.0)
Lymphocytes Relative: 18 % (ref 12.0–46.0)
MCHC: 33.3 g/dL (ref 30.0–36.0)
MCV: 90.3 fl (ref 78.0–100.0)
MONO ABS: 0.5 10*3/uL (ref 0.1–1.0)
Monocytes Relative: 4.7 % (ref 3.0–12.0)
NEUTROS PCT: 76 % (ref 43.0–77.0)
Neutro Abs: 7.5 10*3/uL (ref 1.4–7.7)
Platelets: 230 10*3/uL (ref 150.0–400.0)
RBC: 4.79 Mil/uL (ref 3.87–5.11)
RDW: 14.5 % (ref 11.5–15.5)
WBC: 9.9 10*3/uL (ref 4.0–10.5)

## 2014-11-06 LAB — LIPID PANEL
Cholesterol: 193 mg/dL (ref 0–200)
HDL: 36.7 mg/dL — AB (ref >39.00)
NonHDL: 156.67
TRIGLYCERIDES: 308 mg/dL — AB (ref 0.0–149.0)
Total CHOL/HDL Ratio: 5
VLDL: 61.6 mg/dL — ABNORMAL HIGH (ref 0.0–40.0)

## 2014-11-06 LAB — T4, FREE: FREE T4: 0.95 ng/dL (ref 0.60–1.60)

## 2014-11-06 LAB — HEMOGLOBIN A1C: Hgb A1c MFr Bld: 10.2 % — ABNORMAL HIGH (ref 4.6–6.5)

## 2014-11-06 LAB — LDL CHOLESTEROL, DIRECT: Direct LDL: 118 mg/dL

## 2014-11-06 LAB — HM DIABETES FOOT EXAM

## 2014-11-06 MED ORDER — METHYLPHENIDATE HCL 5 MG PO TABS: 5.0000 mg | ORAL_TABLET | Freq: Two times a day (BID) | ORAL | Status: DC

## 2014-11-06 NOTE — Assessment & Plan Note (Signed)
Again she states a commitment to lifestyle change Hopefully 9% or less Needs eye exam

## 2014-11-06 NOTE — Assessment & Plan Note (Signed)
Due for mammogram Will due fecal immunoassay Will get flu shot later in year Doesn't want zostavax

## 2014-11-06 NOTE — Progress Notes (Signed)
Subjective:    Patient ID: Jacqueline Cole, female    DOB: January 27, 1951, 64 y.o.   MRN: [id number]  HPI Here for physical  Started improving her eating Not able to walk though--competition with sister to start losing weight though Ran out of [place]-- taking onglyza for about 2 months Doesn't check sugars Rare hypoglycemic reactions if she misses meals--lowest measured 35 Some foot pain--able to live with it Overdue for eye exam  Mood has been great No depression or anhedonia Sleeping well  Current Outpatient Prescriptions on File Prior to Visit  Medication Sig Dispense Refill  . BIOTIN PO Take by mouth daily.      . fish oil-omega-3 fatty acids 1000 MG capsule Take 1 g by mouth daily.      Marland Kitchen FLUoxetine (PROZAC) 20 MG capsule Take 1 capsule (20 mg total) by mouth daily. 90 capsule 3  . glipiZIDE (GLUCOTROL) 5 MG tablet TAKE 1 TABLET BY MOUTH TWICE A DAY BEFORE A MEAL 180 tablet 3  . glucose blood (ONE TOUCH TEST STRIPS) test strip Use as instructed to test blood sugar 3 times daily dx: 250.00 300 each 3  . Insulin Syringe-Needle U-100 25G X 5/8" 1 ML MISC by Does not apply route. Use daily as directed (may substitute for insurance)     . LANTUS 100 UNIT/ML injection Inject 0.5 mLs (50 Units total) into the skin 2 (two) times daily. Dx: E11.41 10 vial 3  . metFORMIN (GLUCOPHAGE) 1000 MG tablet TAKE 1 TABLET BY MOUTH 2 TIMES DAILY. 180 tablet 3  . methylphenidate (RITALIN) 5 MG tablet Take 1 tablet (5 mg total) by mouth 2 (two) times daily. 60 tablet 0  . Multiple Vitamin (MULTIVITAMIN PO) Take by mouth.      . ONGLYZA 5 MG TABS tablet TAKE 1 TABLET (5 MG TOTAL) BY MOUTH DAILY. 30 tablet 1  . pravastatin (PRAVACHOL) 80 MG tablet TAKE 1 TABLET (80 MG TOTAL) BY MOUTH DAILY. 90 tablet 3  . venlafaxine XR (EFFEXOR-XR) 150 MG 24 hr capsule TAKE 1 CAPSULE TWICE DAILY 180 capsule 3   No current facility-administered medications on file prior to visit.    No Known Allergies  Past Medical  History  Diagnosis Date  . Depression   . Diabetes mellitus type II   . Hyperlipemia   . Obesity   . Syncope 1998    DM diagnosis    Past Surgical History  Procedure Laterality Date  . Cholecystectomy  1975  . Cosmetic surgery      Injury Right face as a child  . Vaginal delivery      X 2  . Abdominal hysterectomy  ~2005    and BSO    Family History  Problem Relation Age of Onset  . Stroke Father     CVA  . Heart disease Father     MI  . Cancer Sister     breast cancer  . Heart disease Brother     MI  . Cancer Brother     Lung    Social History   Social History  . Marital Status: Married    Spouse Name: N/A  . Number of Children: 2  . Years of Education: N/A   Occupational History  .      "Just up and quit" at [employer] Topics  . Smoking status: Former Smoker    Quit date: 03/15/1993  . Smokeless tobacco: Never Used  . Alcohol Use:  No  . Drug Use: No  . Sexual Activity: Not on file   Other Topics Concern  . Not on file   Social History Narrative   Review of Systems  Constitutional: Negative for fatigue and unexpected weight change.       Wears seat belt  HENT: Positive for tinnitus.        Mild hearing loss Full dentures  Eyes: Negative for visual disturbance.       No diplopia or unilateral vision loss  Respiratory: Positive for cough. Negative for chest tightness and shortness of breath.        Some cough from sinus drainage  Cardiovascular: Negative for chest pain, palpitations and leg swelling.  Gastrointestinal: Negative for abdominal pain and blood in stool.       Vomiting when off januvia--better now Rare heartburn  Endocrine: Negative for polydipsia and polyuria.  Genitourinary: Negative for urgency, frequency and dyspareunia.  Musculoskeletal: Positive for arthralgias. Negative for back pain and joint swelling.       Occ knee pain  Skin:       Face breaks out at times-- better off januvia No suspicious  lesions  Allergic/Immunologic: Positive for environmental allergies. Negative for immunocompromised state.       Uses benedryl at night  Neurological: Positive for numbness. Negative for dizziness, syncope, weakness, light-headedness and headaches.  Hematological: Positive for adenopathy. Does not bruise/bleed easily.       Glands behind ears at times  Psychiatric/Behavioral: Negative for sleep disturbance and dysphoric mood. The patient is not nervous/anxious.        Objective:   Physical Exam  Constitutional: She is oriented to person, place, and time. She appears well-developed and well-nourished. No distress.  HENT:  Head: Normocephalic and atraumatic.  Right Ear: External ear normal.  Left Ear: External ear normal.  Mouth/Throat: Oropharynx is clear and moist. No oropharyngeal exudate.  Eyes: Conjunctivae and EOM are normal. Pupils are equal, round, and reactive to light.  Neck: Normal range of motion. Neck supple. No thyromegaly present.  Cardiovascular: Normal rate, regular rhythm, normal heart sounds and intact distal pulses.  Exam reveals no gallop.   No murmur heard. Pulmonary/Chest: Effort normal and breath sounds normal. No respiratory distress. She has no wheezes. She has no rales.  Abdominal: Soft. There is no tenderness.  Musculoskeletal: She exhibits no edema or tenderness.  Lymphadenopathy:    She has no cervical adenopathy.  Neurological: She is alert and oriented to person, place, and time.  Decreased sensation in feet  Skin: No rash noted. No erythema.  No foot lesions  Psychiatric: She has a normal mood and affect. Her behavior is normal.          Assessment & Plan:

## 2014-11-06 NOTE — Patient Instructions (Addendum)
Please do the fecal test. You are due for your screening mammogram--please set this up. Please get your eye exam--and have them send me a report. Please get your flu shot later this year

## 2014-11-06 NOTE — Progress Notes (Signed)
Pre visit review using our clinic review tool, if applicable. No additional management support is needed unless otherwise documented below in the visit note. 

## 2014-11-06 NOTE — Assessment & Plan Note (Signed)
Will continue same meds

## 2014-11-07 ENCOUNTER — Other Ambulatory Visit: Payer: Self-pay | Admitting: Internal Medicine

## 2014-11-07 DIAGNOSIS — IMO0002 Reserved for concepts with insufficient information to code with codable children: Secondary | ICD-10-CM

## 2014-11-07 DIAGNOSIS — E1165 Type 2 diabetes mellitus with hyperglycemia: Secondary | ICD-10-CM

## 2014-11-07 DIAGNOSIS — E1149 Type 2 diabetes mellitus with other diabetic neurological complication: Secondary | ICD-10-CM

## 2014-11-08 ENCOUNTER — Encounter: Payer: Self-pay | Admitting: *Deleted

## 2014-11-19 ENCOUNTER — Other Ambulatory Visit: Payer: Self-pay | Admitting: Internal Medicine

## 2015-02-12 ENCOUNTER — Other Ambulatory Visit: Payer: Self-pay | Admitting: *Deleted

## 2015-02-12 MED ORDER — METHYLPHENIDATE HCL 5 MG PO TABS: 5.0000 mg | ORAL_TABLET | Freq: Two times a day (BID) | ORAL | Status: DC

## 2015-02-12 NOTE — Telephone Encounter (Signed)
Husband in today for OV asking for med refill for wife, last filled 11/06/2014. rx printed and gave to husband

## 2015-04-11 ENCOUNTER — Other Ambulatory Visit: Payer: Self-pay

## 2015-04-11 MED ORDER — METHYLPHENIDATE HCL 5 MG PO TABS: 5.0000 mg | ORAL_TABLET | Freq: Two times a day (BID) | ORAL | Status: DC

## 2015-04-11 NOTE — Telephone Encounter (Signed)
Pt left v/m requesting rx ritalin. Call when ready for pick up. rx last printed # 20 on 02/12/15. Last seen annual exam on 11/06/14.

## 2015-04-11 NOTE — Telephone Encounter (Signed)
Left message on machine that rx is ready for pick-up, and it will be at our front desk.  

## 2015-04-16 ENCOUNTER — Other Ambulatory Visit: Payer: Self-pay | Admitting: Internal Medicine

## 2015-05-13 ENCOUNTER — Ambulatory Visit: Payer: BLUE CROSS/BLUE SHIELD | Admitting: Internal Medicine

## 2015-08-22 ENCOUNTER — Other Ambulatory Visit: Payer: Self-pay

## 2015-08-22 MED ORDER — METHYLPHENIDATE HCL 5 MG PO TABS: 5.0000 mg | ORAL_TABLET | Freq: Two times a day (BID) | ORAL | Status: DC

## 2015-08-22 NOTE — Telephone Encounter (Signed)
Pt left v/m requesting ritalin rx. Call when ready for pick up. Last printed # 60 on 04/11/15; pt last seen annual exam on 11/06/14.

## 2015-08-22 NOTE — Telephone Encounter (Signed)
Left message on vm per DPR that rx was up front

## 2015-10-10 ENCOUNTER — Other Ambulatory Visit: Payer: Self-pay

## 2015-10-10 NOTE — Telephone Encounter (Signed)
Pt left v/m requesting rx ritalin. Call when ready for pick up. Last printed #60 on 08/22/15 and last annual 11/06/14. No future appt scheduled. Dr Silvio Pate out of office.

## 2015-10-13 MED ORDER — METHYLPHENIDATE HCL 5 MG PO TABS: 5.0000 mg | ORAL_TABLET | Freq: Two times a day (BID) | ORAL | 0 refills | Status: DC

## 2015-10-13 NOTE — Telephone Encounter (Signed)
Message left advising patient and Rx placed up front for pick up. 

## 2015-10-13 NOTE — Telephone Encounter (Signed)
Printed and in Kim's box 

## 2015-10-15 ENCOUNTER — Other Ambulatory Visit: Payer: Self-pay | Admitting: Internal Medicine

## 2015-11-26 ENCOUNTER — Encounter: Payer: Self-pay | Admitting: Internal Medicine

## 2015-11-26 ENCOUNTER — Ambulatory Visit (INDEPENDENT_AMBULATORY_CARE_PROVIDER_SITE_OTHER): Payer: BLUE CROSS/BLUE SHIELD | Admitting: Internal Medicine

## 2015-11-26 VITALS — BP 122/70 | HR 78 | Temp 98.0°F | Ht 65.5 in | Wt 241.0 lb

## 2015-11-26 DIAGNOSIS — IMO0002 Reserved for concepts with insufficient information to code with codable children: Secondary | ICD-10-CM

## 2015-11-26 DIAGNOSIS — E1165 Type 2 diabetes mellitus with hyperglycemia: Secondary | ICD-10-CM | POA: Diagnosis not present

## 2015-11-26 DIAGNOSIS — Z794 Long term (current) use of insulin: Secondary | ICD-10-CM | POA: Diagnosis not present

## 2015-11-26 DIAGNOSIS — F3340 Major depressive disorder, recurrent, in remission, unspecified: Secondary | ICD-10-CM | POA: Diagnosis not present

## 2015-11-26 DIAGNOSIS — Z1211 Encounter for screening for malignant neoplasm of colon: Secondary | ICD-10-CM | POA: Diagnosis not present

## 2015-11-26 DIAGNOSIS — R7989 Other specified abnormal findings of blood chemistry: Secondary | ICD-10-CM | POA: Diagnosis not present

## 2015-11-26 DIAGNOSIS — Z23 Encounter for immunization: Secondary | ICD-10-CM

## 2015-11-26 DIAGNOSIS — E1140 Type 2 diabetes mellitus with diabetic neuropathy, unspecified: Secondary | ICD-10-CM

## 2015-11-26 DIAGNOSIS — Z0000 Encounter for general adult medical examination without abnormal findings: Secondary | ICD-10-CM | POA: Diagnosis not present

## 2015-11-26 LAB — COMPREHENSIVE METABOLIC PANEL
ALT: 19 U/L (ref 0–35)
AST: 24 U/L (ref 0–37)
Albumin: 4.1 g/dL (ref 3.5–5.2)
Alkaline Phosphatase: 100 U/L (ref 39–117)
BUN: 9 mg/dL (ref 6–23)
CALCIUM: 9.3 mg/dL (ref 8.4–10.5)
CHLORIDE: 102 meq/L (ref 96–112)
CO2: 31 meq/L (ref 19–32)
CREATININE: 1.03 mg/dL (ref 0.40–1.20)
GFR: 57.25 mL/min — ABNORMAL LOW (ref >60.00)
Glucose, Bld: 194 mg/dL — ABNORMAL HIGH (ref 70–99)
POTASSIUM: 4 meq/L (ref 3.5–5.1)
Sodium: 141 mEq/L (ref 135–145)
Total Bilirubin: 1 mg/dL (ref 0.2–1.2)
Total Protein: 6.9 g/dL (ref 6.0–8.3)

## 2015-11-26 LAB — LIPID PANEL
Cholesterol: 184 mg/dL (ref 0–200)
HDL: 34.2 mg/dL — AB (ref >39.00)
NONHDL: 149.68
Total CHOL/HDL Ratio: 5
Triglycerides: 288 mg/dL — ABNORMAL HIGH (ref 0.0–149.0)
VLDL: 57.6 mg/dL — AB (ref 0.0–40.0)

## 2015-11-26 LAB — LDL CHOLESTEROL, DIRECT: LDL DIRECT: 111 mg/dL

## 2015-11-26 LAB — CBC WITH DIFFERENTIAL/PLATELET
Basophils Absolute: 0 10*3/uL (ref 0.0–0.1)
Basophils Relative: 0.5 % (ref 0.0–3.0)
EOS PCT: 2 % (ref 0.0–5.0)
Eosinophils Absolute: 0.2 10*3/uL (ref 0.0–0.7)
HCT: 42.3 % (ref 36.0–46.0)
HEMOGLOBIN: 14.3 g/dL (ref 12.0–15.0)
Lymphocytes Relative: 22.8 % (ref 12.0–46.0)
Lymphs Abs: 2.2 10*3/uL (ref 0.7–4.0)
MCHC: 33.8 g/dL (ref 30.0–36.0)
MCV: 86.2 fl (ref 78.0–100.0)
MONOS PCT: 6 % (ref 3.0–12.0)
Monocytes Absolute: 0.6 10*3/uL (ref 0.1–1.0)
Neutro Abs: 6.5 10*3/uL (ref 1.4–7.7)
Neutrophils Relative %: 68.7 % (ref 43.0–77.0)
Platelets: 219 10*3/uL (ref 150.0–400.0)
RBC: 4.91 Mil/uL (ref 3.87–5.11)
RDW: 13.9 % (ref 11.5–15.5)
WBC: 9.5 10*3/uL (ref 4.0–10.5)

## 2015-11-26 LAB — HM DIABETES FOOT EXAM

## 2015-11-26 LAB — HEMOGLOBIN A1C: HEMOGLOBIN A1C: 11.3 % — AB (ref 4.6–6.5)

## 2015-11-26 LAB — MICROALBUMIN / CREATININE URINE RATIO
Creatinine,U: 209.8 mg/dL
Microalb Creat Ratio: 1.9 mg/g (ref 0.0–30.0)
Microalb, Ur: 4 mg/dL — ABNORMAL HIGH (ref 0.0–1.9)

## 2015-11-26 LAB — T4, FREE: FREE T4: 0.71 ng/dL (ref 0.60–1.60)

## 2015-11-26 MED ORDER — METHYLPHENIDATE HCL 5 MG PO TABS: 5.0000 mg | ORAL_TABLET | Freq: Two times a day (BID) | ORAL | 0 refills | Status: DC

## 2015-11-26 NOTE — Progress Notes (Signed)
Subjective:    Patient ID: Jacqueline Cole, female    DOB: October 21, 1950, 65 y.o.   MRN: [id number]  HPI Here for physical Missed her 6 month follow up---insurance issues  Stopped the onglyza Had reaction with vomiting Lost weight but then gained it back Has cut out OTC supplements also Not checking sugars No recent eye exam Ongoing foot pain--- doesn't want another medication. Husband will lotion and massage--does affect sleep at times  Having "acne" that is painful on left cheek Thinks this may also have been from onglyza Has improved since stopping  Mood has been okay No recurrence of the depression  Current Outpatient Prescriptions on File Prior to Visit  Medication Sig Dispense Refill  . FLUoxetine (PROZAC) 20 MG capsule Take 1 capsule (20 mg total) by mouth daily. 90 capsule 3  . glipiZIDE (GLUCOTROL) 5 MG tablet TAKE 1 TABLET BY MOUTH TWICE A DAY BEFORE A MEAL 180 tablet 3  . glucose blood (ONE TOUCH TEST STRIPS) test strip Use as instructed to test blood sugar 3 times daily dx: 250.00 300 each 3  . Insulin Syringe-Needle U-100 25G X 5/8" 1 ML MISC by Does not apply route. Use daily as directed (may substitute for insurance)     . LANTUS 100 UNIT/ML injection Inject 0.5 mLs (50 Units total) into the skin 2 (two) times daily. Dx: E11.41 10 vial 3  . metFORMIN (GLUCOPHAGE) 1000 MG tablet TAKE 1 TABLET BY MOUTH 2 TIMES DAILY. 180 tablet 0  . methylphenidate (RITALIN) 5 MG tablet Take 1 tablet (5 mg total) by mouth 2 (two) times daily. 60 tablet 0  . pravastatin (PRAVACHOL) 80 MG tablet TAKE 1 TABLET (80 MG TOTAL) BY MOUTH DAILY. 90 tablet 3  . venlafaxine XR (EFFEXOR-XR) 150 MG 24 hr capsule TAKE 1 CAPSULE TWICE DAILY 180 capsule 3   No current facility-administered medications on file prior to visit.     Allergies  Allergen Reactions  . Onglyza [Saxagliptin] Hives and Nausea And Vomiting    Past Medical History:  Diagnosis Date  . Depression   . Diabetes mellitus type  II   . Hyperlipemia   . Obesity   . Syncope 1998   DM diagnosis    Past Surgical History:  Procedure Laterality Date  . ABDOMINAL HYSTERECTOMY  ~2005   and BSO  . CHOLECYSTECTOMY  1975  . COSMETIC SURGERY     Injury Right face as a child  . VAGINAL DELIVERY     X 2    Family History  Problem Relation Age of Onset  . Stroke Father     CVA  . Heart disease Father     MI  . Cancer Sister     breast cancer  . Heart disease Brother     MI  . Cancer Brother     Lung    Social History   Social History  . Marital status: Married    Spouse name: N/A  . Number of children: 2  . Years of education: N/A   Occupational History  .      "Just up and quit" at [employer] Topics  . Smoking status: Former Smoker    Quit date: 03/15/1993  . Smokeless tobacco: Never Used  . Alcohol use No  . Drug use: No  . Sexual activity: Not on file   Other Topics Concern  . Not on file   Social History Narrative  . No narrative on  file   Review of Systems  Constitutional: Negative for fatigue and unexpected weight change.       Wears seat belt  HENT: Positive for tinnitus.        Mild hearing loss Full dentures  Eyes:       Has like a "film" over her eyes  Respiratory: Negative for cough, chest tightness and shortness of breath.   Cardiovascular: Positive for palpitations. Negative for chest pain and leg swelling.  Gastrointestinal: Negative for abdominal pain, blood in stool, constipation, nausea and vomiting.  Endocrine: Positive for polydipsia and polyuria.  Genitourinary: Negative for dyspareunia, dysuria and hematuria.  Musculoskeletal: Positive for back pain. Negative for arthralgias.       Stiff trying to get going  Skin:       No suspicious lesions  Allergic/Immunologic: Positive for environmental allergies. Negative for immunocompromised state.       Uses benedryl at night Other OTC in day  Neurological: Positive for dizziness. Negative for  syncope, light-headedness and headaches.  Hematological: Positive for adenopathy. Bruises/bleeds easily.       Painful cervical nodes associated with the facial rash  Psychiatric/Behavioral: Negative for sleep disturbance. The patient is not nervous/anxious.        Objective:   Physical Exam  Constitutional: She appears well-developed and well-nourished. No distress.  HENT:  Head: Normocephalic and atraumatic.  Right Ear: External ear normal.  Left Ear: External ear normal.  Mouth/Throat: Oropharynx is clear and moist. No oropharyngeal exudate.  Eyes: Conjunctivae are normal. Pupils are equal, round, and reactive to light.  Neck: Normal range of motion. Neck supple. No thyromegaly present.  Cardiovascular: Normal rate, regular rhythm, normal heart sounds and intact distal pulses.  Exam reveals no gallop.   No murmur heard. Faint pedal pulses  Pulmonary/Chest: Effort normal and breath sounds normal. No respiratory distress. She has no wheezes. She has no rales.  Abdominal: Soft. There is no tenderness.  Musculoskeletal: She exhibits no edema or tenderness.  Lymphadenopathy:    She has no cervical adenopathy.  Neurological:  Decreased sensation in feet  Skin: No rash noted. No erythema.  No foot lesions  Psychiatric: She has a normal mood and affect. Her behavior is normal.          Assessment & Plan:

## 2015-11-26 NOTE — Assessment & Plan Note (Signed)
Discussed fitness FIT  Due for screening mammogram No pap due to hyster Td today Flu vaccine later this year

## 2015-11-26 NOTE — Assessment & Plan Note (Signed)
Really needs to work on lifestyle She doesn't want an endocrinologist Will increase and split lantus if still very high

## 2015-11-26 NOTE — Addendum Note (Signed)
Addended by: Pilar Grammes on: 11/26/2015 09:54 AM   Modules accepted: Orders

## 2015-11-26 NOTE — Patient Instructions (Addendum)
Please set up your diabetic eye exam. Please set up your screening mammogram.

## 2015-11-26 NOTE — Progress Notes (Signed)
Pre visit review using our clinic review tool, if applicable. No additional management support is needed unless otherwise documented below in the visit note. 

## 2015-11-26 NOTE — Assessment & Plan Note (Signed)
Still in remission on current regimen Would not wean at this time

## 2015-11-27 ENCOUNTER — Other Ambulatory Visit: Payer: Self-pay

## 2015-11-27 MED ORDER — LANTUS 100 UNIT/ML ~~LOC~~ SOLN: 60.0000 [IU] | Freq: Two times a day (BID) | SUBCUTANEOUS | 3 refills | Status: DC

## 2015-11-27 NOTE — Telephone Encounter (Signed)
Per Lab result, new Lantus rx sent in.

## 2015-12-01 MED ORDER — LANTUS 100 UNIT/ML ~~LOC~~ SOLN
60.0000 [IU] | Freq: Two times a day (BID) | SUBCUTANEOUS | 3 refills | Status: DC
Start: 2015-12-01 — End: 2016-06-29

## 2015-12-01 NOTE — Telephone Encounter (Signed)
Pt said only got one vial of lantus and usually gets 11 or 12 vials; I spoke with Summer at [facility] and she said her manager said 13 vials would be a 90 day supply. Summer will change from # 11 vials to # 13 vials with 3 refills. Summer does not know why pt only got one vial. Mrs Bult will ck with pharmacy.

## 2015-12-01 NOTE — Addendum Note (Signed)
Addended by: Helene Shoe on: 12/01/2015 03:20 PM   Modules accepted: Orders

## 2015-12-17 ENCOUNTER — Telehealth: Payer: Self-pay | Admitting: Internal Medicine

## 2015-12-17 MED ORDER — CEPHALEXIN 500 MG PO CAPS: 500.0000 mg | ORAL_CAPSULE | Freq: Three times a day (TID) | ORAL | 0 refills | Status: AC

## 2015-12-17 NOTE — Telephone Encounter (Signed)
Patient Name: Jacqueline Cole  DOB: [birth year]    Initial Comment Caller was seen on the 13th- She has an infection on the face req script   Nurse Assessment  Nurse: Raphael Gibney, RN, Vanita Ingles Date/Time (Eastern Time): 12/17/2015 9:24:36 AM  Confirm and document reason for call. If symptomatic, describe symptoms. You must click the next button to save text entered. ---Caller states she was seen in the office on Sept 13. Has a sore on the left side of her face. Has redness, swelling. it is draining pus and is very painful. She is wanting to get some penicillin called in. She has temp of 100.  Has the patient traveled out of the country within the last 30 days? ---Not Applicable  Does the patient have any new or worsening symptoms? ---Yes  Will a triage be completed? ---Yes  Related visit to physician within the last 2 weeks? ---No  Does the PT have any chronic conditions? (i.e. diabetes, asthma, etc.) ---Yes  List chronic conditions. ---diabetes  Is this a behavioral health or substance abuse call? ---No     Guidelines    Guideline Title Affirmed Question Affirmed Notes  Sores [1] Looks infected (spreading redness, pus) AND [2] diabetes mellitus or weak immune system (e.g., HIV positive, cancer chemotherapy, chronic steroid treatment, splenectomy)    Final Disposition User   See Physician within 4 Hours (or PCP triage) Raphael Gibney, RN, Vera    Comments  no appts available with Dr. Silvio Pate. Pt does not want to see another doctor or go to urgent care. She would like some PCN called in. Please call pt back and let her know about medication.   Referrals  GO TO FACILITY REFUSED   Disagree/Comply: Disagree  Disagree/Comply Reason: Disagree with instructions

## 2015-12-17 NOTE — Telephone Encounter (Signed)
Medication sent to the pharmacy electronically

## 2015-12-17 NOTE — Telephone Encounter (Signed)
You can send Rx for cephalexin 500mg  tid x 5 days Needs visit if it doesn't clear up

## 2015-12-17 NOTE — Telephone Encounter (Signed)
Spoke to pt. She appreciated the antibiotic and will call back if no improvement over the weekend on the antibiotic

## 2015-12-23 ENCOUNTER — Other Ambulatory Visit: Payer: Self-pay | Admitting: Internal Medicine

## 2016-01-01 ENCOUNTER — Other Ambulatory Visit: Payer: Self-pay | Admitting: Internal Medicine

## 2016-01-01 DIAGNOSIS — Z1231 Encounter for screening mammogram for malignant neoplasm of breast: Secondary | ICD-10-CM

## 2016-01-01 MED ORDER — METHYLPHENIDATE HCL 5 MG PO TABS: 5.0000 mg | ORAL_TABLET | Freq: Two times a day (BID) | ORAL | 0 refills | Status: DC

## 2016-01-01 NOTE — Telephone Encounter (Signed)
Pt called to request a refill of her Ritalin.  Can you please contact once this has been refilled.  She can be reached at [phone]

## 2016-01-01 NOTE — Telephone Encounter (Signed)
Last written 11-26-15 #60 Last OV 11-26-15

## 2016-01-01 NOTE — Telephone Encounter (Signed)
Spoke to pt. Rx up front ready for pickup 

## 2016-01-10 ENCOUNTER — Other Ambulatory Visit: Payer: Self-pay | Admitting: Internal Medicine

## 2016-02-04 ENCOUNTER — Ambulatory Visit
Admission: RE | Admit: 2016-02-04 | Discharge: 2016-02-04 | Disposition: A | Discharge status: Home / Self Care | Payer: BLUE CROSS/BLUE SHIELD | Source: Ambulatory Visit | Attending: Internal Medicine | Admitting: Internal Medicine

## 2016-02-04 DIAGNOSIS — Z1231 Encounter for screening mammogram for malignant neoplasm of breast: Secondary | ICD-10-CM | POA: Insufficient documentation

## 2016-03-02 ENCOUNTER — Encounter: Payer: Self-pay | Admitting: Internal Medicine

## 2016-03-02 ENCOUNTER — Ambulatory Visit (INDEPENDENT_AMBULATORY_CARE_PROVIDER_SITE_OTHER): Payer: BLUE CROSS/BLUE SHIELD | Admitting: Internal Medicine

## 2016-03-02 VITALS — BP 138/84 | HR 75 | Temp 97.8°F | Wt 241.0 lb

## 2016-03-02 DIAGNOSIS — Z794 Long term (current) use of insulin: Secondary | ICD-10-CM

## 2016-03-02 DIAGNOSIS — E1140 Type 2 diabetes mellitus with diabetic neuropathy, unspecified: Secondary | ICD-10-CM | POA: Diagnosis not present

## 2016-03-02 DIAGNOSIS — E1165 Type 2 diabetes mellitus with hyperglycemia: Secondary | ICD-10-CM

## 2016-03-02 DIAGNOSIS — IMO0002 Reserved for concepts with insufficient information to code with codable children: Secondary | ICD-10-CM

## 2016-03-02 LAB — HEMOGLOBIN A1C: HEMOGLOBIN A1C: 10 % — AB (ref 4.6–6.5)

## 2016-03-02 MED ORDER — METHYLPHENIDATE HCL 5 MG PO TABS: 5.0000 mg | ORAL_TABLET | Freq: Two times a day (BID) | ORAL | 0 refills | Status: DC

## 2016-03-02 NOTE — Assessment & Plan Note (Addendum)
Still non compliant with proper eating and not exercising Feels better now emotionally--discussed working on lifestyle Waiting for Medicare---will be on in 3 months Next step is liraglutide or comparable  Counseled most of 15 minute visit-- plan for proper eating, starting exercise again, options for other treatment

## 2016-03-02 NOTE — Progress Notes (Signed)
Pre visit review using our clinic review tool, if applicable. No additional management support is needed unless otherwise documented below in the visit note. 

## 2016-03-02 NOTE — Progress Notes (Signed)
Subjective:    Patient ID: Jacqueline Cole, female    DOB: Nov 12, 1950, 65 y.o.   MRN: [id number]  HPI Here for follow up of diabetes being out of control  Hasn't really done much Hopes this year will be "more uplifting" Really thrown a curve by the bad allergic reaction  Weight is the same--she is proud of this Not really checking her sugars Only 1 mild hypoglycemic reaction since last visit  Current Outpatient Prescriptions on File Prior to Visit  Medication Sig Dispense Refill  . FLUoxetine (PROZAC) 20 MG capsule Take 1 capsule (20 mg total) by mouth daily. 90 capsule 3  . glipiZIDE (GLUCOTROL) 5 MG tablet TAKE 1 TABLET BY MOUTH TWICE A DAY BEFORE A MEAL 180 tablet 3  . glucose blood (ONE TOUCH TEST STRIPS) test strip Use as instructed to test blood sugar 3 times daily dx: 250.00 300 each 3  . Insulin Syringe-Needle U-100 25G X 5/8" 1 ML MISC by Does not apply route. Use daily as directed (may substitute for insurance)     . LANTUS 100 UNIT/ML injection Inject 0.6 mLs (60 Units total) into the skin 2 (two) times daily. Dx: E11.41 13 vial 3  . metFORMIN (GLUCOPHAGE) 1000 MG tablet TAKE 1 TABLET BY MOUTH 2 TIMES DAILY. 180 tablet 3  . methylphenidate (RITALIN) 5 MG tablet Take 1 tablet (5 mg total) by mouth 2 (two) times daily. 60 tablet 0  . pravastatin (PRAVACHOL) 80 MG tablet TAKE 1 TABLET (80 MG TOTAL) BY MOUTH DAILY. 90 tablet 3  . venlafaxine XR (EFFEXOR-XR) 150 MG 24 hr capsule TAKE 1 CAPSULE TWICE DAILY 180 capsule 3   No current facility-administered medications on file prior to visit.     Allergies  Allergen Reactions  . Onglyza [Saxagliptin] Hives and Nausea And Vomiting    Past Medical History:  Diagnosis Date  . Depression   . Diabetes mellitus type II   . Hyperlipemia   . Obesity   . Syncope 1998   DM diagnosis    Past Surgical History:  Procedure Laterality Date  . ABDOMINAL HYSTERECTOMY  ~2005   and BSO  . CHOLECYSTECTOMY  1975  . COSMETIC SURGERY       Injury Right face as a child  . VAGINAL DELIVERY     X 2    Family History  Problem Relation Age of Onset  . Stroke Father     CVA  . Heart disease Father     MI  . Cancer Sister     breast cancer  . Breast cancer Sister 46  . Heart disease Brother     MI  . Cancer Brother     Lung    Social History   Social History  . Marital status: Married    Spouse name: N/A  . Number of children: 2  . Years of education: N/A   Occupational History  .      "Just up and quit" at [employer] Topics  . Smoking status: Former Smoker    Quit date: 03/15/1993  . Smokeless tobacco: Never Used  . Alcohol use No  . Drug use: No  . Sexual activity: Not on file   Other Topics Concern  . Not on file   Social History Narrative  . No narrative on file   Review of Systems Some sinus symptoms---had nosebleed yesterday. Discussed humidifier Weight stable Sleeping okay    Objective:   Physical Exam  Constitutional: She appears well-nourished. No distress.  Psychiatric: She has a normal mood and affect. Her behavior is normal.          Assessment & Plan:

## 2016-03-11 ENCOUNTER — Other Ambulatory Visit: Payer: Self-pay | Admitting: Internal Medicine

## 2016-05-26 ENCOUNTER — Ambulatory Visit (INDEPENDENT_AMBULATORY_CARE_PROVIDER_SITE_OTHER): Payer: Medicare HMO | Admitting: Internal Medicine

## 2016-05-26 ENCOUNTER — Encounter: Payer: Self-pay | Admitting: Internal Medicine

## 2016-05-26 ENCOUNTER — Other Ambulatory Visit: Payer: Self-pay | Admitting: Internal Medicine

## 2016-05-26 VITALS — BP 124/72 | HR 79 | Temp 97.6°F | Wt 241.0 lb

## 2016-05-26 DIAGNOSIS — E1165 Type 2 diabetes mellitus with hyperglycemia: Secondary | ICD-10-CM | POA: Diagnosis not present

## 2016-05-26 DIAGNOSIS — F3340 Major depressive disorder, recurrent, in remission, unspecified: Secondary | ICD-10-CM

## 2016-05-26 DIAGNOSIS — E1140 Type 2 diabetes mellitus with diabetic neuropathy, unspecified: Secondary | ICD-10-CM

## 2016-05-26 DIAGNOSIS — R69 Illness, unspecified: Secondary | ICD-10-CM | POA: Diagnosis not present

## 2016-05-26 DIAGNOSIS — IMO0002 Reserved for concepts with insufficient information to code with codable children: Secondary | ICD-10-CM

## 2016-05-26 DIAGNOSIS — Z794 Long term (current) use of insulin: Secondary | ICD-10-CM | POA: Diagnosis not present

## 2016-05-26 LAB — HEMOGLOBIN A1C: Hgb A1c MFr Bld: 10.5 % — ABNORMAL HIGH (ref 4.6–6.5)

## 2016-05-26 MED ORDER — METHYLPHENIDATE HCL 5 MG PO TABS: 5.0000 mg | ORAL_TABLET | Freq: Two times a day (BID) | ORAL | 0 refills | Status: DC

## 2016-05-26 MED ORDER — LIRAGLUTIDE 18 MG/3ML ~~LOC~~ SOPN: 1.2000 mg | PEN_INJECTOR | Freq: Every day | SUBCUTANEOUS | 1 refills | Status: DC

## 2016-05-26 NOTE — Assessment & Plan Note (Signed)
Hasn't really made any changes Still dietary problems she wasn't aware of---honey, orange juice Will plan to add liraglutide if A1c too high--- and stop the glipizide May need to reduce insulin also  Close follow up Still prefers no endocrinologist (though I suggested)

## 2016-05-26 NOTE — Patient Instructions (Signed)
I will let you know about starting the new injections after I see the results. If you start the shots, the pharmacist can show you how to use it, and I would want you to stop the glipizide.

## 2016-05-26 NOTE — Progress Notes (Signed)
Subjective:    Patient ID: Jacqueline Cole, female    DOB: Feb 07, 1951, 66 y.o.   MRN: [id number]  HPI Here for follow up of diabetes  She admits "I haven't done a thing" Not really depressed--"Just tired" She blames the high sugar levels She does work hard at her diet---cut out all sugared sodas (but uses honey and drinks orange juice) Mostly cooks-- lots of vegetables She does feel she gets low sugars and "then overcompensates" Often 60-70 in the morning  Current Outpatient Prescriptions on File Prior to Visit  Medication Sig Dispense Refill  . FLUoxetine (PROZAC) 20 MG capsule Take 1 capsule (20 mg total) by mouth daily. 90 capsule 3  . glipiZIDE (GLUCOTROL) 5 MG tablet TAKE 1 TABLET BY MOUTH TWICE A DAY BEFORE A MEAL 180 tablet 3  . glucose blood (ONE TOUCH TEST STRIPS) test strip Use as instructed to test blood sugar 3 times daily dx: 250.00 300 each 3  . Insulin Syringe-Needle U-100 25G X 5/8" 1 ML MISC by Does not apply route. Use daily as directed (may substitute for insurance)     . LANTUS 100 UNIT/ML injection Inject 0.6 mLs (60 Units total) into the skin 2 (two) times daily. Dx: E11.41 13 vial 3  . metFORMIN (GLUCOPHAGE) 1000 MG tablet TAKE 1 TABLET BY MOUTH 2 TIMES DAILY. 180 tablet 3  . methylphenidate (RITALIN) 5 MG tablet Take 1 tablet (5 mg total) by mouth 2 (two) times daily. 60 tablet 0  . pravastatin (PRAVACHOL) 80 MG tablet TAKE 1 TABLET (80 MG TOTAL) BY MOUTH DAILY. 90 tablet 3  . venlafaxine XR (EFFEXOR-XR) 150 MG 24 hr capsule TAKE 1 CAPSULE TWICE DAILY 180 capsule 3   No current facility-administered medications on file prior to visit.     Allergies  Allergen Reactions  . Onglyza [Saxagliptin] Hives and Nausea And Vomiting    Past Medical History:  Diagnosis Date  . Depression   . Diabetes mellitus type II   . Hyperlipemia   . Obesity   . Syncope 1998   DM diagnosis    Past Surgical History:  Procedure Laterality Date  . ABDOMINAL HYSTERECTOMY   ~2005   and BSO  . CHOLECYSTECTOMY  1975  . COSMETIC SURGERY     Injury Right face as a child  . VAGINAL DELIVERY     X 2    Family History  Problem Relation Age of Onset  . Stroke Father     CVA  . Heart disease Father     MI  . Cancer Sister     breast cancer  . Breast cancer Sister 53  . Heart disease Brother     MI  . Cancer Brother     Lung    Social History   Social History  . Marital status: Married    Spouse name: N/A  . Number of children: 2  . Years of education: N/A   Occupational History  .      "Just up and quit" at [employer] Topics  . Smoking status: Former Smoker    Quit date: 03/15/1993  . Smokeless tobacco: Never Used  . Alcohol use No  . Drug use: No  . Sexual activity: Not on file   Other Topics Concern  . Not on file   Social History Narrative  . No narrative on file   Review of Systems Appetite is okay Weight is the same    Objective:  Physical Exam  Constitutional: She appears well-nourished. No distress.  Cardiovascular: Normal rate, regular rhythm and normal heart sounds.  Exam reveals no gallop.   No murmur heard. Faint distal pulses  Pulmonary/Chest: Effort normal and breath sounds normal. No respiratory distress. She has no wheezes. She has no rales.  Musculoskeletal: She exhibits no edema.  Skin:  No foot lesions  Psychiatric: She has a normal mood and affect. Her behavior is normal.          Assessment & Plan:

## 2016-05-26 NOTE — Assessment & Plan Note (Signed)
Still doing okay with meds Will renew the ritalin

## 2016-05-26 NOTE — Progress Notes (Signed)
Pre visit review using our clinic review tool, if applicable. No additional management support is needed unless otherwise documented below in the visit note. 

## 2016-06-17 ENCOUNTER — Telehealth: Payer: Self-pay

## 2016-06-17 NOTE — Telephone Encounter (Signed)
Pt's ins will not cover methylphenidate for a Depression diagnosis. I have appeal information if you want to do it.

## 2016-06-17 NOTE — Telephone Encounter (Signed)
I do need to appeal it. It is known and standard adjunctive treatment for depression in the elderly It is also not expensive--make sure she doesn't stop it

## 2016-06-17 NOTE — Telephone Encounter (Signed)
Form filled out on CMM for methyphenidate PA.

## 2016-06-18 NOTE — Telephone Encounter (Signed)
Appeal done over the phone with [place] at [place]. 72 hour turnaround.

## 2016-06-18 NOTE — Telephone Encounter (Addendum)
Spoke to pt. She said she is having issues with her lungs. Feels dizzy. Still thinks it may be related to the victoza. She said she took herself off methylphenidate due to the tinnitus she had.

## 2016-06-29 ENCOUNTER — Encounter: Payer: Self-pay | Admitting: Internal Medicine

## 2016-06-29 ENCOUNTER — Ambulatory Visit (INDEPENDENT_AMBULATORY_CARE_PROVIDER_SITE_OTHER): Payer: Medicare HMO | Admitting: Internal Medicine

## 2016-06-29 ENCOUNTER — Ambulatory Visit (INDEPENDENT_AMBULATORY_CARE_PROVIDER_SITE_OTHER)
Admission: RE | Admit: 2016-06-29 | Discharge: 2016-06-29 | Disposition: A | Discharge status: Home / Self Care | Payer: Medicare HMO | Source: Ambulatory Visit | Attending: Internal Medicine | Admitting: Internal Medicine

## 2016-06-29 ENCOUNTER — Other Ambulatory Visit: Payer: Self-pay

## 2016-06-29 VITALS — BP 132/82 | HR 110 | Temp 97.6°F | Wt 210.8 lb

## 2016-06-29 DIAGNOSIS — R0602 Shortness of breath: Secondary | ICD-10-CM | POA: Diagnosis not present

## 2016-06-29 DIAGNOSIS — E1165 Type 2 diabetes mellitus with hyperglycemia: Secondary | ICD-10-CM | POA: Diagnosis not present

## 2016-06-29 DIAGNOSIS — Z794 Long term (current) use of insulin: Secondary | ICD-10-CM

## 2016-06-29 DIAGNOSIS — E1140 Type 2 diabetes mellitus with diabetic neuropathy, unspecified: Secondary | ICD-10-CM

## 2016-06-29 DIAGNOSIS — IMO0002 Reserved for concepts with insufficient information to code with codable children: Secondary | ICD-10-CM

## 2016-06-29 DIAGNOSIS — J189 Pneumonia, unspecified organism: Secondary | ICD-10-CM | POA: Diagnosis not present

## 2016-06-29 MED ORDER — GLUCOSE BLOOD VI STRP: ORAL_STRIP | 1 refills | Status: DC

## 2016-06-29 MED ORDER — LEVOFLOXACIN 500 MG PO TABS: 500.0000 mg | ORAL_TABLET | Freq: Every day | ORAL | 0 refills | Status: DC

## 2016-06-29 NOTE — Progress Notes (Signed)
Subjective:    Patient ID: Jacqueline Cole, female    DOB: [birth year], 66 y.o.   MRN: [id number]  HPI Here for follow up of diabetes  Has done well with the liraglutide Feet don't hurt anymore Has lost 30# Tolerating the med as long as she doesn't overeat  Has had spinning in her head Breathing is very bad--with the minimal activity Fell in [facility] the med started These started before the new med No chest pain No palpitations Sleeps in bed and chair No PND and sleeps fairly flat  Not checking sugars  Current Outpatient Prescriptions on File Prior to Visit  Medication Sig Dispense Refill  . FLUoxetine (PROZAC) 20 MG capsule Take 1 capsule (20 mg total) by mouth daily. 90 capsule 3  . glucose blood (ONE TOUCH TEST STRIPS) test strip Use as instructed to test blood sugar 3 times daily dx: 250.00 300 each 3  . Insulin Syringe-Needle U-100 25G X 5/8" 1 ML MISC by Does not apply route. Use daily as directed (may substitute for insurance)     . liraglutide 18 MG/3ML SOPN Inject 0.2 mLs (1.2 mg total) into the skin daily. 6 mL 1  . venlafaxine XR (EFFEXOR-XR) 150 MG 24 hr capsule TAKE 1 CAPSULE TWICE DAILY 180 capsule 3  . metFORMIN (GLUCOPHAGE) 1000 MG tablet TAKE 1 TABLET BY MOUTH 2 TIMES DAILY. (Patient not taking: Reported on 06/29/2016) 180 tablet 3  . methylphenidate (RITALIN) 5 MG tablet Take 1 tablet (5 mg total) by mouth 2 (two) times daily. (Patient not taking: Reported on 06/29/2016) 60 tablet 0  . pravastatin (PRAVACHOL) 80 MG tablet TAKE 1 TABLET (80 MG TOTAL) BY MOUTH DAILY. (Patient not taking: Reported on 06/29/2016) 90 tablet 3   No current facility-administered medications on file prior to visit.     Allergies  Allergen Reactions  . Onglyza [Saxagliptin] Hives and Nausea And Vomiting    Past Medical History:  Diagnosis Date  . Depression   . Diabetes mellitus type II   . Hyperlipemia   . Obesity   . Syncope 1998   DM diagnosis    Past Surgical  History:  Procedure Laterality Date  . ABDOMINAL HYSTERECTOMY  ~2005   and BSO  . CHOLECYSTECTOMY  1975  . COSMETIC SURGERY     Injury Right face as a child  . VAGINAL DELIVERY     X 2    Family History  Problem Relation Age of Onset  . Stroke Father     CVA  . Heart disease Father     MI  . Cancer Sister     breast cancer  . Breast cancer Sister 51  . Heart disease Brother     MI  . Cancer Brother     Lung    Social History   Social History  . Marital status: Married    Spouse name: N/A  . Number of children: 2  . Years of education: N/A   Occupational History  .      "Just up and quit" at [employer] Topics  . Smoking status: Former Smoker    Quit date: 03/15/1993  . Smokeless tobacco: Never Used  . Alcohol use No  . Drug use: No  . Sexual activity: Not on file   Other Topics Concern  . Not on file   Social History Narrative  . No narrative on file   Review of Systems Appetite is okay No N/V Some  pain in upper back---felt like something popped. SOB started after this No fever No coughing    Objective:   Physical Exam  Constitutional: She appears well-nourished. No distress.  Neck: No thyromegaly present.  Cardiovascular: Regular rhythm and normal heart sounds.  Exam reveals no gallop.   No murmur heard. Mild tachycardia  Pulmonary/Chest: Effort normal and breath sounds normal. No respiratory distress. She has no wheezes. She has no rales.  Abdominal: Soft. There is no tenderness.  Musculoskeletal: She exhibits no edema.  Lymphadenopathy:    She has no cervical adenopathy.          Assessment & Plan:

## 2016-06-29 NOTE — Addendum Note (Signed)
Addended by: Modena Nunnery on: 06/29/2016 10:17 AM   Modules accepted: Orders

## 2016-06-29 NOTE — Progress Notes (Signed)
Pre visit review using our clinic review tool, if applicable. No additional management support is needed unless otherwise documented below in the visit note. 

## 2016-06-29 NOTE — Assessment & Plan Note (Addendum)
Happened after "pop" in back---but no signs of pulmonary infection No chest pain but with tachycardia, I am concerned about LV dysfunction or ischemia  EKG shows slight tachycardia-- poor R wave progression, but no distinct ischemic changes CXR looks normal--will await the radiologist interpretation  Will proceed with cardiology evaluation Will likely need echo and stress test

## 2016-06-29 NOTE — Telephone Encounter (Signed)
Pt seen earlier today and pt to restart checking BS bid. Pt request test strips to [facility] [address]. Pt notified done per protocol. Pt will ckl with pharmacy.

## 2016-06-29 NOTE — Assessment & Plan Note (Signed)
Tolerating the new med and has lost considerable weight Will recheck A1c at next visit

## 2016-06-30 ENCOUNTER — Telehealth: Payer: Self-pay | Admitting: Internal Medicine

## 2016-06-30 ENCOUNTER — Telehealth: Payer: Self-pay

## 2016-06-30 DIAGNOSIS — R69 Illness, unspecified: Secondary | ICD-10-CM | POA: Diagnosis not present

## 2016-06-30 MED ORDER — ONETOUCH ULTRA SYSTEM W/DEVICE KIT: PACK | 0 refills | Status: DC

## 2016-06-30 NOTE — Telephone Encounter (Signed)
Patient returned [facility] call.  Please call patient.  They said it's important.

## 2016-06-30 NOTE — Telephone Encounter (Signed)
Pt request onetouch ultra meter to [facility] [address]. Pt does not need lancets. Advised pt rx sent to [facility] [address]. Pt voiced understanding.

## 2016-07-01 NOTE — Telephone Encounter (Signed)
Spoke to pt at Dr Alla German request to confirm how she has been feeling since starting abx. Pt states that she is "feeling about the same" and her BS was 420 on 4/18 and she states that she took 20 units of lantus appx 1500. Pt will contact office back this am after eating breakfast, with new BS numbers. Per Dr Silvio Pate, it pt was not feeling any better, she would need f/u appt; appt sched for 4/23

## 2016-07-01 NOTE — Telephone Encounter (Signed)
Pt called. Todays reading is 413. She is feeling about the same. She is requesting a cb.

## 2016-07-01 NOTE — Telephone Encounter (Signed)
She still doesn't feel great. Breathing is still not good Continuing the antibiotic Will go ahead with the appt 4/23 and decide on further plans then  Sugars high but for some reason she stopped the metformin. Asked her to restart this and she will monitor the sugars

## 2016-07-05 ENCOUNTER — Ambulatory Visit: Payer: Medicare HMO | Admitting: Internal Medicine

## 2016-07-07 ENCOUNTER — Encounter: Payer: Self-pay | Admitting: Internal Medicine

## 2016-07-07 ENCOUNTER — Ambulatory Visit (INDEPENDENT_AMBULATORY_CARE_PROVIDER_SITE_OTHER): Payer: Medicare HMO | Admitting: Internal Medicine

## 2016-07-07 VITALS — BP 112/70 | HR 103 | Temp 97.7°F | Resp 18 | Wt 205.0 lb

## 2016-07-07 DIAGNOSIS — R0602 Shortness of breath: Secondary | ICD-10-CM

## 2016-07-07 DIAGNOSIS — E1165 Type 2 diabetes mellitus with hyperglycemia: Secondary | ICD-10-CM | POA: Diagnosis not present

## 2016-07-07 DIAGNOSIS — J181 Lobar pneumonia, unspecified organism: Secondary | ICD-10-CM | POA: Insufficient documentation

## 2016-07-07 DIAGNOSIS — IMO0002 Reserved for concepts with insufficient information to code with codable children: Secondary | ICD-10-CM

## 2016-07-07 DIAGNOSIS — R69 Illness, unspecified: Secondary | ICD-10-CM | POA: Diagnosis not present

## 2016-07-07 DIAGNOSIS — Z794 Long term (current) use of insulin: Secondary | ICD-10-CM | POA: Diagnosis not present

## 2016-07-07 DIAGNOSIS — E1140 Type 2 diabetes mellitus with diabetic neuropathy, unspecified: Secondary | ICD-10-CM

## 2016-07-07 DIAGNOSIS — F3340 Major depressive disorder, recurrent, in remission, unspecified: Secondary | ICD-10-CM

## 2016-07-07 MED ORDER — LANTUS 100 UNIT/ML ~~LOC~~ SOLN: 60.0000 [IU] | Freq: Two times a day (BID) | SUBCUTANEOUS | 3 refills | Status: DC

## 2016-07-07 NOTE — Assessment & Plan Note (Signed)
Will proceed with cardiology appt

## 2016-07-07 NOTE — Progress Notes (Signed)
Subjective:    Patient ID: Jacqueline Cole, female    DOB: January 02, 1951, 66 y.o.   MRN: [id number]  HPI Here with husband for follow up of possible pneumonia  "I feel horrible" Getting headaches now Still with SOB and dizziness [place] twice--"the power goes out" First time was before starting the liraglutide  Had vomiting and didn't feel well--likely from the antibiotic  Mood is worse Went off the ritalin-- severe tinnitus which then improved Thinks her depression is worse  Sugars are running very high Over 400 much of the time  Current Outpatient Prescriptions on File Prior to Visit  Medication Sig Dispense Refill  . Blood Glucose Monitoring Suppl (ONE TOUCH ULTRA SYSTEM KIT) w/Device KIT Ck blood sugar twice a day and as directed. Dx #E11.49 1 each 0  . FLUoxetine (PROZAC) 20 MG capsule Take 1 capsule (20 mg total) by mouth daily. 90 capsule 3  . glucose blood (ONE TOUCH TEST STRIPS) test strip Check  blood sugar 2 times daily and as instructed. dx:E11.49 200 each 1  . Insulin Syringe-Needle U-100 25G X 5/8" 1 ML MISC by Does not apply route. Use daily as directed (may substitute for insurance)     . liraglutide 18 MG/3ML SOPN Inject 0.2 mLs (1.2 mg total) into the skin daily. 6 mL 1  . metFORMIN (GLUCOPHAGE) 1000 MG tablet TAKE 1 TABLET BY MOUTH 2 TIMES DAILY. 180 tablet 3  . venlafaxine XR (EFFEXOR-XR) 150 MG 24 hr capsule TAKE 1 CAPSULE TWICE DAILY 180 capsule 3  . methylphenidate (RITALIN) 5 MG tablet Take 1 tablet (5 mg total) by mouth 2 (two) times daily. (Patient not taking: Reported on 07/07/2016) 60 tablet 0  . pravastatin (PRAVACHOL) 80 MG tablet TAKE 1 TABLET (80 MG TOTAL) BY MOUTH DAILY. (Patient not taking: Reported on 06/29/2016) 90 tablet 3   No current facility-administered medications on file prior to visit.     Allergies  Allergen Reactions  . Onglyza [Saxagliptin] Hives and Nausea And Vomiting    Past Medical History:  Diagnosis Date  . Depression   .  Diabetes mellitus type II   . Hyperlipemia   . Obesity   . Syncope 1998   DM diagnosis    Past Surgical History:  Procedure Laterality Date  . ABDOMINAL HYSTERECTOMY  ~2005   and BSO  . CHOLECYSTECTOMY  1975  . COSMETIC SURGERY     Injury Right face as a child  . VAGINAL DELIVERY     X 2    Family History  Problem Relation Age of Onset  . Stroke Father     CVA  . Heart disease Father     MI  . Cancer Sister     breast cancer  . Breast cancer Sister 16  . Heart disease Brother     MI  . Cancer Brother     Lung    Social History   Social History  . Marital status: Married    Spouse name: N/A  . Number of children: 2  . Years of education: N/A   Occupational History  .      "Just up and quit" at [employer] Topics  . Smoking status: Former Smoker    Quit date: 03/15/1993  . Smokeless tobacco: Never Used  . Alcohol use No  . Drug use: No  . Sexual activity: Not on file   Other Topics Concern  . Not on file   Social History  Narrative  . No narrative on file   Review of Systems Has lost 35# in the past few months Not able to sleep    Objective:   Physical Exam  Constitutional:  Looks uncomfortable  Neck: No thyromegaly present.  Cardiovascular: Normal rate, regular rhythm and normal heart sounds.  Exam reveals no gallop.   No murmur heard. Pulmonary/Chest: Effort normal and breath sounds normal. No respiratory distress. She has no wheezes. She has no rales.  Musculoskeletal: She exhibits no edema.  Lymphadenopathy:    She has no cervical adenopathy.          Assessment & Plan:

## 2016-07-07 NOTE — Assessment & Plan Note (Signed)
RML infiltrate but subtle and didn't have infection symptoms Done with levaquin--but no clinical change Will just recheck CXR at follow up

## 2016-07-07 NOTE — Assessment & Plan Note (Signed)
Clearly worse on the liraglutide---nausea, high sugars, lost weight but feels terrible Will stop and restart the lantus

## 2016-07-07 NOTE — Progress Notes (Signed)
Pre visit review using our clinic review tool, if applicable. No additional management support is needed unless otherwise documented below in the visit note. 

## 2016-07-07 NOTE — Assessment & Plan Note (Signed)
She asks for more meds--but I want to see how she is off the liraglutide (which is making her sick)

## 2016-07-07 NOTE — Patient Instructions (Signed)
Stop the liraglutide. Go back on the lantus--- 60 units twice a day. Keep the cardiology appointment.

## 2016-07-09 ENCOUNTER — Encounter: Payer: Self-pay | Admitting: Internal Medicine

## 2016-07-09 MED ORDER — LANTUS 100 UNIT/ML ~~LOC~~ SOLN: 60.0000 [IU] | Freq: Two times a day (BID) | SUBCUTANEOUS | 3 refills | Status: DC

## 2016-07-14 ENCOUNTER — Ambulatory Visit: Payer: Medicare HMO | Admitting: Internal Medicine

## 2016-07-16 ENCOUNTER — Telehealth: Payer: Self-pay

## 2016-07-16 NOTE — Telephone Encounter (Signed)
Received a form from [place] saying it was recommended that people ages 4-075 with diabetes for on a statin. We have pravastatin in her med list. Tried to call pt but her vm was full.

## 2016-07-21 ENCOUNTER — Encounter: Payer: Self-pay | Admitting: Internal Medicine

## 2016-07-21 ENCOUNTER — Ambulatory Visit (INDEPENDENT_AMBULATORY_CARE_PROVIDER_SITE_OTHER): Payer: Medicare HMO | Admitting: Internal Medicine

## 2016-07-21 VITALS — BP 112/66 | HR 87 | Temp 97.9°F | Wt 209.2 lb

## 2016-07-21 DIAGNOSIS — IMO0002 Reserved for concepts with insufficient information to code with codable children: Secondary | ICD-10-CM

## 2016-07-21 DIAGNOSIS — E1140 Type 2 diabetes mellitus with diabetic neuropathy, unspecified: Secondary | ICD-10-CM

## 2016-07-21 DIAGNOSIS — J181 Lobar pneumonia, unspecified organism: Secondary | ICD-10-CM

## 2016-07-21 DIAGNOSIS — Z794 Long term (current) use of insulin: Secondary | ICD-10-CM

## 2016-07-21 DIAGNOSIS — E1165 Type 2 diabetes mellitus with hyperglycemia: Secondary | ICD-10-CM | POA: Diagnosis not present

## 2016-07-21 NOTE — Assessment & Plan Note (Signed)
Didn't tolerate the liraglutide Feels better now She will work harder on eating right and is back on the lantus

## 2016-07-21 NOTE — Progress Notes (Signed)
Pre visit review using our clinic review tool, if applicable. No additional management support is needed unless otherwise documented below in the visit note. 

## 2016-07-21 NOTE — Assessment & Plan Note (Signed)
Clinically better I recommended a repeat CXR today but she asks to defer it to the next appt ("I don't feel up to it")

## 2016-07-21 NOTE — Progress Notes (Signed)
Subjective:    Patient ID: Jacqueline Cole, female    DOB: [birth year], 66 y.o.   MRN: [id number]  HPI Here for follow up of pneumonia and diabetes  She feels better No longer nauseated or vomiting No falls Breathing is better  Back on lantus 60 bid Sugars averaging 160 on bid checks No hypoglycemic reactions  Current Outpatient Prescriptions on File Prior to Visit  Medication Sig Dispense Refill  . Blood Glucose Monitoring Suppl (ONE TOUCH ULTRA SYSTEM KIT) w/Device KIT Ck blood sugar twice a day and as directed. Dx #E11.49 1 each 0  . FLUoxetine (PROZAC) 20 MG capsule Take 1 capsule (20 mg total) by mouth daily. 90 capsule 3  . glucose blood (ONE TOUCH TEST STRIPS) test strip Check  blood sugar 2 times daily and as instructed. dx:E11.49 200 each 1  . Insulin Syringe-Needle U-100 25G X 5/8" 1 ML MISC by Does not apply route. Use daily as directed (may substitute for insurance)     . LANTUS 100 UNIT/ML injection Inject 0.6 mLs (60 Units total) into the skin 2 (two) times daily. Dx: E11.41 13 vial 3  . metFORMIN (GLUCOPHAGE) 1000 MG tablet TAKE 1 TABLET BY MOUTH 2 TIMES DAILY. 180 tablet 3  . pravastatin (PRAVACHOL) 80 MG tablet TAKE 1 TABLET (80 MG TOTAL) BY MOUTH DAILY. 90 tablet 3  . venlafaxine XR (EFFEXOR-XR) 150 MG 24 hr capsule TAKE 1 CAPSULE TWICE DAILY 180 capsule 3   No current facility-administered medications on file prior to visit.     Allergies  Allergen Reactions  . Onglyza [Saxagliptin] Hives and Nausea And Vomiting  . Liraglutide Nausea Only    Can't eat, nausea, higher sugars    Past Medical History:  Diagnosis Date  . Depression   . Diabetes mellitus type II   . Hyperlipemia   . Obesity   . Syncope 1998   DM diagnosis    Past Surgical History:  Procedure Laterality Date  . ABDOMINAL HYSTERECTOMY  ~2005   and BSO  . CHOLECYSTECTOMY  1975  . COSMETIC SURGERY     Injury Right face as a child  . VAGINAL DELIVERY     X 2    Family History    Problem Relation Age of Onset  . Stroke Father     CVA  . Heart disease Father     MI  . Cancer Sister     breast cancer  . Breast cancer Sister 73  . Heart disease Brother     MI  . Cancer Brother     Lung    Social History   Social History  . Marital status: Married    Spouse name: N/A  . Number of children: 2  . Years of education: N/A   Occupational History  .      "Just up and quit" at [place] Topics  . Smoking status: Former Smoker    Quit date: 03/15/1993  . Smokeless tobacco: Never Used  . Alcohol use No  . Drug use: No  . Sexual activity: Not on file   Other Topics Concern  . Not on file   Social History Narrative  . No narrative on file   Review of Systems Regained some of the weight she lost Mood has been okay---even off the ritalin    Objective:   Physical Exam  Constitutional: She appears well-nourished. No distress.  Pulmonary/Chest: Effort normal and breath sounds normal. No respiratory  distress. She has no wheezes. She has no rales.          Assessment & Plan:

## 2016-08-05 DIAGNOSIS — E113293 Type 2 diabetes mellitus with mild nonproliferative diabetic retinopathy without macular edema, bilateral: Secondary | ICD-10-CM | POA: Diagnosis not present

## 2016-08-14 DIAGNOSIS — R69 Illness, unspecified: Secondary | ICD-10-CM | POA: Diagnosis not present

## 2016-09-28 ENCOUNTER — Ambulatory Visit: Payer: Medicare HMO | Admitting: Internal Medicine

## 2016-10-13 ENCOUNTER — Encounter: Payer: Self-pay | Admitting: Internal Medicine

## 2016-10-13 ENCOUNTER — Ambulatory Visit (INDEPENDENT_AMBULATORY_CARE_PROVIDER_SITE_OTHER): Payer: Medicare HMO | Admitting: Internal Medicine

## 2016-10-13 VITALS — BP 128/80 | HR 77 | Temp 98.0°F | Wt 229.0 lb

## 2016-10-13 DIAGNOSIS — IMO0002 Reserved for concepts with insufficient information to code with codable children: Secondary | ICD-10-CM

## 2016-10-13 DIAGNOSIS — E1140 Type 2 diabetes mellitus with diabetic neuropathy, unspecified: Secondary | ICD-10-CM

## 2016-10-13 DIAGNOSIS — E1165 Type 2 diabetes mellitus with hyperglycemia: Secondary | ICD-10-CM | POA: Diagnosis not present

## 2016-10-13 DIAGNOSIS — Z794 Long term (current) use of insulin: Secondary | ICD-10-CM

## 2016-10-13 LAB — HEMOGLOBIN A1C: Hgb A1c MFr Bld: 8.2 % — ABNORMAL HIGH (ref 4.6–6.5)

## 2016-10-13 NOTE — Assessment & Plan Note (Signed)
Feeling back to normal off the liraglutide Sugars seem okay Will check A1c--- goal under 9% but no more meds yet either way

## 2016-10-13 NOTE — Progress Notes (Signed)
Subjective:    Patient ID: Jacqueline Cole, female    DOB: September 15, 1950, 66 y.o.   MRN: [id number]  HPI Here for follow up of diabetes  "I am almost back to normal" Stomach and intestines are better now Not as dizzy, etc Gained some of the weight back  Checking sugars bid AM fasting-- and after dinner----average 167 Has had a couple of minor low blood sugar reactions--some shakes/weakness Discussed checking second time before dinner  Current Outpatient Prescriptions on File Prior to Visit  Medication Sig Dispense Refill  . Blood Glucose Monitoring Suppl (ONE TOUCH ULTRA SYSTEM KIT) w/Device KIT Ck blood sugar twice a day and as directed. Dx #E11.49 1 each 0  . FLUoxetine (PROZAC) 20 MG capsule Take 1 capsule (20 mg total) by mouth daily. 90 capsule 3  . glucose blood (ONE TOUCH TEST STRIPS) test strip Check  blood sugar 2 times daily and as instructed. dx:E11.49 200 each 1  . Insulin Syringe-Needle U-100 25G X 5/8" 1 ML MISC by Does not apply route. Use daily as directed (may substitute for insurance)     . LANTUS 100 UNIT/ML injection Inject 0.6 mLs (60 Units total) into the skin 2 (two) times daily. Dx: E11.41 13 vial 3  . metFORMIN (GLUCOPHAGE) 1000 MG tablet TAKE 1 TABLET BY MOUTH 2 TIMES DAILY. 180 tablet 3  . pravastatin (PRAVACHOL) 80 MG tablet TAKE 1 TABLET (80 MG TOTAL) BY MOUTH DAILY. 90 tablet 3  . venlafaxine XR (EFFEXOR-XR) 150 MG 24 hr capsule TAKE 1 CAPSULE TWICE DAILY 180 capsule 3   No current facility-administered medications on file prior to visit.     Allergies  Allergen Reactions  . Onglyza [Saxagliptin] Hives and Nausea And Vomiting  . Liraglutide Nausea Only    Can't eat, nausea, higher sugars    Past Medical History:  Diagnosis Date  . Depression   . Diabetes mellitus type II   . Hyperlipemia   . Obesity   . Syncope 1998   DM diagnosis    Past Surgical History:  Procedure Laterality Date  . ABDOMINAL HYSTERECTOMY  ~2005   and BSO  .  CHOLECYSTECTOMY  1975  . COSMETIC SURGERY     Injury Right face as a child  . VAGINAL DELIVERY     X 2    Family History  Problem Relation Age of Onset  . Stroke Father        CVA  . Heart disease Father        MI  . Cancer Sister        breast cancer  . Breast cancer Sister 32  . Heart disease Brother        MI  . Cancer Brother        Lung    Social History   Social History  . Marital status: Married    Spouse name: N/A  . Number of children: 2  . Years of education: N/A   Occupational History  .      "Just up and quit" at [place] Topics  . Smoking status: Former Smoker    Quit date: 03/15/1993  . Smokeless tobacco: Never Used  . Alcohol use No  . Drug use: No  . Sexual activity: Not on file   Other Topics Concern  . Not on file   Social History Narrative  . No narrative on file   Review of Systems  Pneumonia is completely resolved--still prefers  no CXR follow up yet Saw eye doctor recently--- cataracts but no retinopathy     Objective:   Physical Exam  Constitutional: She appears well-developed. No distress.  Cardiovascular: Normal rate, regular rhythm, normal heart sounds and intact distal pulses.  Exam reveals no gallop.   No murmur heard. Pulmonary/Chest: Effort normal and breath sounds normal. No respiratory distress. She has no wheezes. She has no rales.  Musculoskeletal: She exhibits no edema.  Skin:  No foot lesions          Assessment & Plan:

## 2016-10-28 DIAGNOSIS — R69 Illness, unspecified: Secondary | ICD-10-CM | POA: Diagnosis not present

## 2016-12-13 ENCOUNTER — Encounter: Payer: Self-pay | Admitting: Internal Medicine

## 2016-12-13 ENCOUNTER — Ambulatory Visit (INDEPENDENT_AMBULATORY_CARE_PROVIDER_SITE_OTHER): Payer: Medicare HMO | Admitting: Internal Medicine

## 2016-12-13 VITALS — BP 136/72 | HR 97 | Temp 97.7°F | Ht 65.0 in | Wt 233.0 lb

## 2016-12-13 DIAGNOSIS — M1711 Unilateral primary osteoarthritis, right knee: Secondary | ICD-10-CM

## 2016-12-13 DIAGNOSIS — E2839 Other primary ovarian failure: Secondary | ICD-10-CM

## 2016-12-13 DIAGNOSIS — E1149 Type 2 diabetes mellitus with other diabetic neurological complication: Secondary | ICD-10-CM

## 2016-12-13 DIAGNOSIS — E1165 Type 2 diabetes mellitus with hyperglycemia: Secondary | ICD-10-CM

## 2016-12-13 DIAGNOSIS — Z23 Encounter for immunization: Secondary | ICD-10-CM

## 2016-12-13 DIAGNOSIS — Z1211 Encounter for screening for malignant neoplasm of colon: Secondary | ICD-10-CM

## 2016-12-13 DIAGNOSIS — Z7189 Other specified counseling: Secondary | ICD-10-CM | POA: Diagnosis not present

## 2016-12-13 DIAGNOSIS — F3340 Major depressive disorder, recurrent, in remission, unspecified: Secondary | ICD-10-CM

## 2016-12-13 DIAGNOSIS — Z0000 Encounter for general adult medical examination without abnormal findings: Secondary | ICD-10-CM

## 2016-12-13 DIAGNOSIS — R69 Illness, unspecified: Secondary | ICD-10-CM | POA: Diagnosis not present

## 2016-12-13 DIAGNOSIS — IMO0002 Reserved for concepts with insufficient information to code with codable children: Secondary | ICD-10-CM

## 2016-12-13 LAB — HM DIABETES FOOT EXAM

## 2016-12-13 NOTE — Assessment & Plan Note (Signed)
BMI over 35 and has diabetes Has information Will try to get back to walking after cortisone shot

## 2016-12-13 NOTE — Progress Notes (Signed)
Hearing Screening   Method: Audiometry   125Hz  250Hz  500Hz  1000Hz  2000Hz  3000Hz  4000Hz  6000Hz  8000Hz   Right ear:   20 20 20   0    Left ear:   25 20 0  0    Vision Screening Comments: April 2018

## 2016-12-13 NOTE — Assessment & Plan Note (Signed)
Hopefully acceptable control Didn't tolerate liraglutide Consider glipizide if not under 9% Some neuropathy pain in feet--not enough for Rx

## 2016-12-13 NOTE — Assessment & Plan Note (Signed)
I have personally reviewed the Medicare Annual Wellness questionnaire and have noted  1. The patient's medical and social history  2. Their use of alcohol, tobacco or illicit drugs  3. Their current medications and supplements  4. The patient's functional ability including ADL's, fall risks, home safety risks and hearing or visual              impairment.  5. Diet and physical activities  6. Evidence for depression or mood disorders  The patients weight, height, BMI and visual acuity have been recorded in the chart  I have made referrals, counseling and provided education to the patient based review of the above and I have provided the pt with a written personalized care plan for preventive services.   I have provided you with a copy of your personalized plan for preventive services. Please take the time to review along with your updated medication list.  Will do FIT Mammogram due 11/19 No pap due to hyster Discussed fitness prevnar today. She prefers waiting for the flu vaccine

## 2016-12-13 NOTE — Addendum Note (Signed)
Addended by: Verlene Mayer A on: 12/13/2016 03:27 PM   Modules accepted: Orders

## 2016-12-13 NOTE — Assessment & Plan Note (Signed)
Acting up again Will set up for cortisone shot

## 2016-12-13 NOTE — Progress Notes (Signed)
Subjective:    Patient ID: Jacqueline Cole, female    DOB: October 17, 1950, 66 y.o.   MRN: [id number]  HPI Here for initial Medicare wellness visit and follow up of chronic health conditions Reviewed form and advance directives Reviewed other doctors No alcohol and no longer uses any tobacco Tries to walk regularly--but limited now due to knee Vision is not good--needs cataract surgery (going back in November) Mild hearing problems. Hearing test--- threshold 20 Hz at all frequencies. Left ear threshold 25dB at _0 , 20 dB at 1000 Hz and didn't hear at 2000 or 4000 Did have fall with injury--- when was sick. Cut hand. 2 other falls without injury Independent with instrumental ADLs Mild memory issues--mostly recall issues  Feels almost back to normal But had bad sinus symptoms since [facility] Also right knee hurting bad also--wants another cortisone shot  Checking sugars bid Fasting 115 usually--- 160 before supper mostly On lantus 60 bid Has had 4 low sugar reactions--all at night. Gets shakes and will awaken her. Drinks 2 ounces coke and resolves Ongoing sharp foot and leg pain--can be bad. Can live with it--doesn't want more meds Dr Ellin Mayhew did do exam in April  Chronic depression is ongoing Now having some anxiety Thinks things are "different"-- ever since being on the victoza  No chest pain No palpitations other than with low sugars Some dizziness with weather change--no syncope No edema  Current Outpatient Prescriptions on File Prior to Visit  Medication Sig Dispense Refill  . Blood Glucose Monitoring Suppl (ONE TOUCH ULTRA SYSTEM KIT) w/Device KIT Ck blood sugar twice a day and as directed. Dx #E11.49 1 each 0  . FLUoxetine (PROZAC) 20 MG capsule Take 1 capsule (20 mg total) by mouth daily. 90 capsule 3  . glucose blood (ONE TOUCH TEST STRIPS) test strip Check  blood sugar 2 times daily and as instructed. dx:E11.49 200 each 1  . Insulin Syringe-Needle U-100 25G X  5/8" 1 ML MISC by Does not apply route. Use daily as directed (may substitute for insurance)     . LANTUS 100 UNIT/ML injection Inject 0.6 mLs (60 Units total) into the skin 2 (two) times daily. Dx: E11.41 13 vial 3  . metFORMIN (GLUCOPHAGE) 1000 MG tablet TAKE 1 TABLET BY MOUTH 2 TIMES DAILY. 180 tablet 3  . pravastatin (PRAVACHOL) 80 MG tablet TAKE 1 TABLET (80 MG TOTAL) BY MOUTH DAILY. 90 tablet 3  . venlafaxine XR (EFFEXOR-XR) 150 MG 24 hr capsule TAKE 1 CAPSULE TWICE DAILY 180 capsule 3   No current facility-administered medications on file prior to visit.     Allergies  Allergen Reactions  . Onglyza [Saxagliptin] Hives and Nausea And Vomiting  . Liraglutide Nausea Only    Can't eat, nausea, higher sugars    Past Medical History:  Diagnosis Date  . Depression   . Diabetes mellitus type II   . Hyperlipemia   . Obesity   . Syncope 1998   DM diagnosis    Past Surgical History:  Procedure Laterality Date  . ABDOMINAL HYSTERECTOMY  ~2005   and BSO  . CHOLECYSTECTOMY  1975  . COSMETIC SURGERY     Injury Right face as a child  . VAGINAL DELIVERY     X 2    Family History  Problem Relation Age of Onset  . Stroke Father        CVA  . Heart disease Father        MI  . Cancer Sister  breast cancer  . Breast cancer Sister 63  . Heart disease Brother        MI  . Cancer Brother        Lung    Social History   Social History  . Marital status: Married    Spouse name: N/A  . Number of children: 2  . Years of education: N/A   Occupational History  .      "Just up and quit" at [place] Topics  . Smoking status: Former Smoker    Quit date: 03/15/1993  . Smokeless tobacco: Never Used  . Alcohol use No  . Drug use: No  . Sexual activity: Not on file   Other Topics Concern  . Not on file   Social History Narrative   Has living will   Husband, then daughter [name], have health care POA   Would reluctantly accept resuscitation but  no life prolonging measures   Review of Systems Having some facial outbreaks--rash No other skin problems. Appetite is okay Weight back up to previous level Full dentures Sleeps okay Bowels are fairly normal for her. No blood Voids okay. No incontinence No heartburn or dysphagia    Objective:   Physical Exam  Constitutional: She is oriented to person, place, and time. She appears well-developed. No distress.  HENT:  Mouth/Throat: Oropharynx is clear and moist. No oropharyngeal exudate.  Neck: No thyromegaly present.  Cardiovascular: Normal rate, regular rhythm and normal heart sounds.  Exam reveals no gallop.   No murmur heard. ??faint pedal pulses  Pulmonary/Chest: Effort normal and breath sounds normal. No respiratory distress. She has no wheezes. She has no rales.  Abdominal: Soft. She exhibits no distension. There is no tenderness.  Musculoskeletal: She exhibits no edema.  Lymphadenopathy:    She has no cervical adenopathy.  Neurological: She is alert and oriented to person, place, and time.  President-- "[employer]" [phone] [id number] Recall 3/3  Sensation to light touch intact in feet  Skin: No rash noted.  No foot lesions  Psychiatric: She has a normal mood and affect. Her behavior is normal.          Assessment & Plan:

## 2016-12-13 NOTE — Assessment & Plan Note (Signed)
See social history 

## 2016-12-13 NOTE — Assessment & Plan Note (Signed)
Has had some anxiety and not felt right since the reaction to the medication No change for now

## 2016-12-14 LAB — COMPREHENSIVE METABOLIC PANEL
ALBUMIN: 4.1 g/dL (ref 3.5–5.2)
ALT: 16 U/L (ref 0–35)
AST: 19 U/L (ref 0–37)
Alkaline Phosphatase: 85 U/L (ref 39–117)
BILIRUBIN TOTAL: 0.7 mg/dL (ref 0.2–1.2)
BUN: 10 mg/dL (ref 6–23)
CALCIUM: 9.6 mg/dL (ref 8.4–10.5)
CHLORIDE: 98 meq/L (ref 96–112)
CO2: 29 mEq/L (ref 19–32)
CREATININE: 1.18 mg/dL (ref 0.40–1.20)
GFR: 48.77 mL/min — ABNORMAL LOW (ref >60.00)
Glucose, Bld: 327 mg/dL — ABNORMAL HIGH (ref 70–99)
Potassium: 4 mEq/L (ref 3.5–5.1)
Sodium: 138 mEq/L (ref 135–145)
Total Protein: 6.7 g/dL (ref 6.0–8.3)

## 2016-12-14 LAB — CBC WITH DIFFERENTIAL/PLATELET
BASOS ABS: 0.1 10*3/uL (ref 0.0–0.1)
Basophils Relative: 1.4 % (ref 0.0–3.0)
EOS ABS: 0.1 10*3/uL (ref 0.0–0.7)
Eosinophils Relative: 0.7 % (ref 0.0–5.0)
HCT: 42.9 % (ref 36.0–46.0)
Hemoglobin: 14.2 g/dL (ref 12.0–15.0)
LYMPHS PCT: 24.5 % (ref 12.0–46.0)
Lymphs Abs: 2.7 10*3/uL (ref 0.7–4.0)
MCHC: 33.1 g/dL (ref 30.0–36.0)
MCV: 87.6 fl (ref 78.0–100.0)
Monocytes Absolute: 0.7 10*3/uL (ref 0.1–1.0)
Monocytes Relative: 6.4 % (ref 3.0–12.0)
NEUTROS PCT: 67 % (ref 43.0–77.0)
Neutro Abs: 7.3 10*3/uL (ref 1.4–7.7)
Platelets: 284 10*3/uL (ref 150.0–400.0)
RBC: 4.9 Mil/uL (ref 3.87–5.11)
RDW: 13.7 % (ref 11.5–15.5)
WBC: 10.8 10*3/uL — AB (ref 4.0–10.5)

## 2016-12-14 LAB — LDL CHOLESTEROL, DIRECT: LDL DIRECT: 169 mg/dL

## 2016-12-14 LAB — LIPID PANEL
CHOLESTEROL: 275 mg/dL — AB (ref 0–200)
HDL: 35 mg/dL — ABNORMAL LOW (ref >39.00)
NonHDL: 240.19
Total CHOL/HDL Ratio: 8
Triglycerides: 400 mg/dL — ABNORMAL HIGH (ref 0.0–149.0)
VLDL: 80 mg/dL — AB (ref 0.0–40.0)

## 2017-01-12 ENCOUNTER — Telehealth: Payer: Self-pay | Admitting: Internal Medicine

## 2017-01-12 NOTE — Telephone Encounter (Signed)
Left message asking pt to call office regarding scheduling bone density

## 2017-01-13 ENCOUNTER — Ambulatory Visit: Payer: Medicare HMO | Admitting: Internal Medicine

## 2017-01-25 DIAGNOSIS — R69 Illness, unspecified: Secondary | ICD-10-CM | POA: Diagnosis not present

## 2017-02-02 ENCOUNTER — Encounter: Payer: Self-pay | Admitting: Internal Medicine

## 2017-02-10 ENCOUNTER — Encounter: Payer: Self-pay | Admitting: Internal Medicine

## 2017-02-10 ENCOUNTER — Ambulatory Visit
Admission: RE | Admit: 2017-02-10 | Discharge: 2017-02-10 | Disposition: A | Discharge status: Home / Self Care | Payer: Medicare HMO | Source: Ambulatory Visit | Attending: Internal Medicine | Admitting: Internal Medicine

## 2017-02-10 DIAGNOSIS — M8588 Other specified disorders of bone density and structure, other site: Secondary | ICD-10-CM | POA: Insufficient documentation

## 2017-02-10 DIAGNOSIS — E2839 Other primary ovarian failure: Secondary | ICD-10-CM | POA: Diagnosis not present

## 2017-02-10 DIAGNOSIS — M8580 Other specified disorders of bone density and structure, unspecified site: Secondary | ICD-10-CM | POA: Insufficient documentation

## 2017-02-10 DIAGNOSIS — M8589 Other specified disorders of bone density and structure, multiple sites: Secondary | ICD-10-CM | POA: Diagnosis not present

## 2017-02-10 DIAGNOSIS — Z780 Asymptomatic menopausal state: Secondary | ICD-10-CM | POA: Diagnosis not present

## 2017-02-22 DIAGNOSIS — E113293 Type 2 diabetes mellitus with mild nonproliferative diabetic retinopathy without macular edema, bilateral: Secondary | ICD-10-CM | POA: Diagnosis not present

## 2017-02-22 DIAGNOSIS — H2513 Age-related nuclear cataract, bilateral: Secondary | ICD-10-CM | POA: Diagnosis not present

## 2017-04-13 ENCOUNTER — Telehealth: Payer: Self-pay | Admitting: Internal Medicine

## 2017-04-13 NOTE — Telephone Encounter (Signed)
We have to have better clarification on "ALL" medications. She needs to provide [place] a list.

## 2017-04-13 NOTE — Telephone Encounter (Signed)
Left a message for the pt to call back with the specific names of the medications she wants done. There are diabetic supplies on her list as well as medications.    PEC, if pt calls back, please verify names of medications she wants.

## 2017-04-13 NOTE — Telephone Encounter (Signed)
PT USES [facility]IL IN PHARMACY WITH HER [employer] [facility] FOR 2019. HTA card scanned. Pt request 90 days supply sent to mail pharmacy on [place] card. Ph [phone].  Pt ID#  [id number]. Please send in scripts for ALL medications pt takes.  Call pt for questions [phone]. Also pt wants provider to be aware she is having cataract surgery on 04/29/17.

## 2017-04-14 ENCOUNTER — Encounter: Payer: Self-pay | Admitting: Internal Medicine

## 2017-04-14 DIAGNOSIS — E113412 Type 2 diabetes mellitus with severe nonproliferative diabetic retinopathy with macular edema, left eye: Secondary | ICD-10-CM | POA: Diagnosis not present

## 2017-04-14 DIAGNOSIS — E113491 Type 2 diabetes mellitus with severe nonproliferative diabetic retinopathy without macular edema, right eye: Secondary | ICD-10-CM | POA: Diagnosis not present

## 2017-04-14 DIAGNOSIS — H25013 Cortical age-related cataract, bilateral: Secondary | ICD-10-CM | POA: Diagnosis not present

## 2017-04-14 DIAGNOSIS — H2511 Age-related nuclear cataract, right eye: Secondary | ICD-10-CM | POA: Diagnosis not present

## 2017-04-14 DIAGNOSIS — H2513 Age-related nuclear cataract, bilateral: Secondary | ICD-10-CM | POA: Diagnosis not present

## 2017-04-14 LAB — HM DIABETES EYE EXAM

## 2017-04-14 NOTE — Telephone Encounter (Signed)
Left message to call office

## 2017-04-15 NOTE — Telephone Encounter (Signed)
Left another detailed message advising pt I needed more clarity on "ALL" refills. I also suggested she contact her pharmacy and have them contact us for her refills. I cannot fill her meds without knowing which ones.

## 2017-04-25 ENCOUNTER — Encounter (INDEPENDENT_AMBULATORY_CARE_PROVIDER_SITE_OTHER): Payer: Medicare HMO | Admitting: Ophthalmology

## 2017-04-25 DIAGNOSIS — E11311 Type 2 diabetes mellitus with unspecified diabetic retinopathy with macular edema: Secondary | ICD-10-CM

## 2017-04-25 DIAGNOSIS — E113513 Type 2 diabetes mellitus with proliferative diabetic retinopathy with macular edema, bilateral: Secondary | ICD-10-CM | POA: Diagnosis not present

## 2017-04-25 DIAGNOSIS — H43813 Vitreous degeneration, bilateral: Secondary | ICD-10-CM

## 2017-04-25 LAB — HM DIABETES EYE EXAM

## 2017-04-27 ENCOUNTER — Telehealth: Payer: Self-pay

## 2017-04-27 DIAGNOSIS — IMO0002 Reserved for concepts with insufficient information to code with codable children: Secondary | ICD-10-CM

## 2017-04-27 DIAGNOSIS — E1165 Type 2 diabetes mellitus with hyperglycemia: Secondary | ICD-10-CM

## 2017-04-27 DIAGNOSIS — E1149 Type 2 diabetes mellitus with other diabetic neurological complication: Secondary | ICD-10-CM

## 2017-04-27 NOTE — Telephone Encounter (Signed)
Pt last annual visit on 12/13/16

## 2017-04-27 NOTE — Telephone Encounter (Signed)
Copied from [place]. Topic: Referral - Request >> Apr 27, 2017  2:20 PM Scherrie Gerlach wrote: Reason for CRM: Pt states she saw the ophthalmologist today and he recommends  she get referral to an Endocrinologist. Pt hopes Dr Silvio Pate will do this for her.

## 2017-04-28 ENCOUNTER — Encounter: Payer: Self-pay | Admitting: Internal Medicine

## 2017-04-28 NOTE — Telephone Encounter (Signed)
I have spoken to her in the past about seeing a endocrinologist--and she didn't want to go. Find out if she is willing to go now (and if so, [place])

## 2017-04-28 NOTE — Telephone Encounter (Signed)
Pt called back and said she is fine with where ever Dr. Silvio Pate feels is best for her to go.

## 2017-04-28 NOTE — Telephone Encounter (Signed)
Apparently has retinopathy that has him concerned.  Discussed that I have been trying to get her to endocrinologist for years and she has been reluctant--but that her last A1c was pretty good. Will recheck A1c now. If worse, and especially if well over 8%, will proceed with referral to Dr Cruzita Lederer

## 2017-04-28 NOTE — Telephone Encounter (Signed)
Tried to call pt. VM is full so I could not leave a message.   PEC, please ask pt he preference per Dr Alla German note.

## 2017-04-29 ENCOUNTER — Other Ambulatory Visit: Payer: Self-pay

## 2017-04-29 NOTE — Telephone Encounter (Signed)
Called pt and made lab appt at 215 today.

## 2017-05-03 ENCOUNTER — Encounter: Payer: Self-pay | Admitting: Internal Medicine

## 2017-05-03 DIAGNOSIS — E1149 Type 2 diabetes mellitus with other diabetic neurological complication: Secondary | ICD-10-CM

## 2017-05-03 DIAGNOSIS — E1165 Type 2 diabetes mellitus with hyperglycemia: Principal | ICD-10-CM

## 2017-05-03 DIAGNOSIS — IMO0002 Reserved for concepts with insufficient information to code with codable children: Secondary | ICD-10-CM

## 2017-05-09 ENCOUNTER — Encounter: Payer: Self-pay | Admitting: Internal Medicine

## 2017-05-09 MED ORDER — LANTUS 100 UNIT/ML ~~LOC~~ SOLN: 60.0000 [IU] | Freq: Two times a day (BID) | SUBCUTANEOUS | 3 refills | Status: DC

## 2017-05-09 NOTE — Telephone Encounter (Signed)
Please refill her insulin and postpone her appt till May so we can review how things go with Dr Cruzita Lederer

## 2017-05-23 ENCOUNTER — Encounter (INDEPENDENT_AMBULATORY_CARE_PROVIDER_SITE_OTHER): Payer: Medicare HMO | Admitting: Ophthalmology

## 2017-05-23 DIAGNOSIS — H43813 Vitreous degeneration, bilateral: Secondary | ICD-10-CM | POA: Diagnosis not present

## 2017-05-23 DIAGNOSIS — E113513 Type 2 diabetes mellitus with proliferative diabetic retinopathy with macular edema, bilateral: Secondary | ICD-10-CM | POA: Diagnosis not present

## 2017-05-23 DIAGNOSIS — H2513 Age-related nuclear cataract, bilateral: Secondary | ICD-10-CM

## 2017-05-23 DIAGNOSIS — E11311 Type 2 diabetes mellitus with unspecified diabetic retinopathy with macular edema: Secondary | ICD-10-CM | POA: Diagnosis not present

## 2017-06-14 ENCOUNTER — Ambulatory Visit: Payer: Self-pay | Admitting: Internal Medicine

## 2017-06-20 ENCOUNTER — Encounter (INDEPENDENT_AMBULATORY_CARE_PROVIDER_SITE_OTHER): Payer: Medicare HMO | Admitting: Ophthalmology

## 2017-06-20 DIAGNOSIS — H2513 Age-related nuclear cataract, bilateral: Secondary | ICD-10-CM

## 2017-06-20 DIAGNOSIS — H43813 Vitreous degeneration, bilateral: Secondary | ICD-10-CM | POA: Diagnosis not present

## 2017-06-20 DIAGNOSIS — E113513 Type 2 diabetes mellitus with proliferative diabetic retinopathy with macular edema, bilateral: Secondary | ICD-10-CM | POA: Diagnosis not present

## 2017-06-20 DIAGNOSIS — E11311 Type 2 diabetes mellitus with unspecified diabetic retinopathy with macular edema: Secondary | ICD-10-CM | POA: Diagnosis not present

## 2017-06-21 ENCOUNTER — Telehealth: Payer: Self-pay | Admitting: Internal Medicine

## 2017-06-23 MED ORDER — GLUCOSE BLOOD VI STRP: ORAL_STRIP | 1 refills | Status: DC

## 2017-06-23 NOTE — Addendum Note (Signed)
Addended by: Pilar Grammes on: 06/23/2017 04:59 PM   Modules accepted: Orders

## 2017-06-23 NOTE — Telephone Encounter (Signed)
[facility] faxed information and showed wrong ssn. For pt.  Please check and refax.

## 2017-06-23 NOTE — Telephone Encounter (Signed)
Having to call the pharmacy. I have no idea what they are saying.

## 2017-06-23 NOTE — Telephone Encounter (Signed)
She needed the dx code

## 2017-06-27 ENCOUNTER — Other Ambulatory Visit: Payer: Self-pay

## 2017-06-27 MED ORDER — GLUCOSE BLOOD VI STRP: ORAL_STRIP | 12 refills | Status: DC

## 2017-06-27 MED ORDER — ACCU-CHEK AVIVA PLUS W/DEVICE KIT: 1.0000 | PACK | Freq: Once | 0 refills | Status: AC

## 2017-06-27 MED ORDER — ACCU-CHEK FASTCLIX LANCETS MISC: 3 refills | Status: DC

## 2017-06-27 MED ORDER — ACCU-CHEK FASTCLIX LANCET KIT: 1.0000 | PACK | Freq: Once | 0 refills | Status: AC

## 2017-06-27 NOTE — Telephone Encounter (Signed)
Received a fax from [facility] stating OneTouch is not covered but Accu-Chek and True Metrix is. Needs a new meter, strips, and lancets.

## 2017-06-29 ENCOUNTER — Encounter (INDEPENDENT_AMBULATORY_CARE_PROVIDER_SITE_OTHER): Payer: Medicare HMO | Admitting: Ophthalmology

## 2017-06-29 DIAGNOSIS — E113511 Type 2 diabetes mellitus with proliferative diabetic retinopathy with macular edema, right eye: Secondary | ICD-10-CM | POA: Diagnosis not present

## 2017-06-29 DIAGNOSIS — E11311 Type 2 diabetes mellitus with unspecified diabetic retinopathy with macular edema: Secondary | ICD-10-CM

## 2017-06-29 MED ORDER — ACCU-CHEK SOFTCLIX LANCETS MISC: 3 refills | Status: DC

## 2017-06-29 NOTE — Addendum Note (Signed)
Addended by: Pilar Grammes on: 06/29/2017 03:33 PM   Modules accepted: Orders

## 2017-06-30 MED ORDER — ACCU-CHEK SOFTCLIX LANCETS MISC: 3 refills | Status: DC

## 2017-06-30 NOTE — Addendum Note (Signed)
Addended by: Pilar Grammes on: 06/30/2017 01:27 PM   Modules accepted: Orders

## 2017-07-01 ENCOUNTER — Ambulatory Visit: Payer: Self-pay | Admitting: Internal Medicine

## 2017-07-15 ENCOUNTER — Encounter (INDEPENDENT_AMBULATORY_CARE_PROVIDER_SITE_OTHER): Payer: Medicare HMO | Admitting: Ophthalmology

## 2017-07-15 DIAGNOSIS — E11311 Type 2 diabetes mellitus with unspecified diabetic retinopathy with macular edema: Secondary | ICD-10-CM

## 2017-07-15 DIAGNOSIS — E113512 Type 2 diabetes mellitus with proliferative diabetic retinopathy with macular edema, left eye: Secondary | ICD-10-CM

## 2017-07-16 ENCOUNTER — Encounter: Payer: Self-pay | Admitting: Internal Medicine

## 2017-07-26 ENCOUNTER — Encounter (INDEPENDENT_AMBULATORY_CARE_PROVIDER_SITE_OTHER): Payer: Medicare HMO | Admitting: Ophthalmology

## 2017-07-27 ENCOUNTER — Ambulatory Visit: Payer: Self-pay | Admitting: Internal Medicine

## 2017-07-28 ENCOUNTER — Ambulatory Visit: Payer: Self-pay | Admitting: Internal Medicine

## 2017-07-28 ENCOUNTER — Encounter: Payer: Self-pay | Admitting: Internal Medicine

## 2017-07-28 VITALS — BP 134/84 | HR 91 | Ht 65.0 in | Wt 234.0 lb

## 2017-07-28 DIAGNOSIS — IMO0002 Reserved for concepts with insufficient information to code with codable children: Secondary | ICD-10-CM

## 2017-07-28 DIAGNOSIS — E1165 Type 2 diabetes mellitus with hyperglycemia: Secondary | ICD-10-CM

## 2017-07-28 DIAGNOSIS — E1149 Type 2 diabetes mellitus with other diabetic neurological complication: Secondary | ICD-10-CM | POA: Diagnosis not present

## 2017-07-28 LAB — POCT GLYCOSYLATED HEMOGLOBIN (HGB A1C): Hemoglobin A1C: 8.3

## 2017-07-28 MED ORDER — LANTUS 100 UNIT/ML ~~LOC~~ SOLN: 30.0000 [IU] | Freq: Two times a day (BID) | SUBCUTANEOUS | 3 refills | Status: DC

## 2017-07-28 MED ORDER — LIRAGLUTIDE 18 MG/3ML ~~LOC~~ SOPN: PEN_INJECTOR | SUBCUTANEOUS | 3 refills | Status: DC

## 2017-07-28 NOTE — Patient Instructions (Addendum)
Please continue: - Metformin 1000 mg 2x a day with meals  Please decrease: - Lantus to 30 units 2x a day  Please start Victoza 0.6 mg daily before b'fast. If you tolerate this well, increase to 1.2 mg.   Please let me know if the sugars are consistently <80 or >200.  Please return in 3 months with your sugar log.   PATIENT INSTRUCTIONS FOR TYPE 2 DIABETES:  **Please join MyChart!** - see attached instructions about how to join if you have not done so already.  DIET AND EXERCISE Diet and exercise is an important part of diabetic treatment.  We recommended aerobic exercise in the form of brisk walking (working between 40-60% of maximal aerobic capacity, similar to brisk walking) for 150 minutes per week (such as 30 minutes five days per week) along with 3 times per week performing 'resistance' training (using various gauge rubber tubes with handles) 5-10 exercises involving the major muscle groups (upper body, lower body and core) performing 10-15 repetitions (or near fatigue) each exercise. Start at half the above goal but build slowly to reach the above goals. If limited by weight, joint pain, or disability, we recommend daily walking in a swimming pool with water up to waist to reduce pressure from joints while allow for adequate exercise.    BLOOD GLUCOSES Monitoring your blood glucoses is important for continued management of your diabetes. Please check your blood glucoses 2-4 times a day: fasting, before meals and at bedtime (you can rotate these measurements - e.g. one day check before the 3 meals, the next day check before 2 of the meals and before bedtime, etc.).   HYPOGLYCEMIA (low blood sugar) Hypoglycemia is usually a reaction to not eating, exercising, or taking too much insulin/ other diabetes drugs.  Symptoms include tremors, sweating, hunger, confusion, headache, etc. Treat IMMEDIATELY with 15 grams of Carbs: . 4 glucose tablets .  cup regular juice/soda . 2 tablespoons  raisins . 4 teaspoons sugar . 1 tablespoon honey Recheck blood glucose in 15 mins and repeat above if still symptomatic/blood glucose <100.  RECOMMENDATIONS TO REDUCE YOUR RISK OF DIABETIC COMPLICATIONS: * Take your prescribed MEDICATION(S) * Follow a DIABETIC diet: Complex carbs, fiber rich foods, (monounsaturated and polyunsaturated) fats * AVOID saturated/trans fats, high fat foods, >2,300 mg salt per day. * EXERCISE at least 5 times a week for 30 minutes or preferably daily.  * DO NOT SMOKE OR DRINK more than 1 drink a day. * Check your FEET every day. Do not wear tightfitting shoes. Contact us if you develop an ulcer * See your EYE doctor once a year or more if needed * Get a FLU shot once a year * Get a PNEUMONIA vaccine once before and once after age 16 years  GOALS:  * Your Hemoglobin A1c of <7%  * fasting sugars need to be <130 * after meals sugars need to be <180 (2h after you start eating) * Your Systolic BP should be 540 or lower  * Your Diastolic BP should be 80 or lower  * Your HDL (Good Cholesterol) should be 40 or higher  * Your LDL (Bad Cholesterol) should be 100 or lower. * Your Triglycerides should be 150 or lower  * Your Urine microalbumin (kidney function) should be <30 * Your Body Mass Index should be 25 or lower    Please consider the following ways to cut down carbs and fat and increase fiber and micronutrients in your diet: - substitute whole grain for  white bread or pasta - substitute brown rice for white rice - substitute 90-calorie flat bread pieces for slices of bread when possible - substitute sweet potatoes or yams for white potatoes - substitute humus for margarine - substitute tofu for cheese when possible - substitute almond or rice milk for regular milk (would not drink soy milk daily due to concern for soy estrogen influence on breast cancer risk) - substitute dark chocolate for other sweets when possible - substitute water - can add lemon or  orange slices for taste - for diet sodas (artificial sweeteners will trick your body that you can eat sweets without getting calories and will lead you to overeating and weight gain in the long run) - do not skip breakfast or other meals (this will slow down the metabolism and will result in more weight gain over time)  - can try smoothies made from fruit and almond/rice milk in am instead of regular breakfast - can also try old-fashioned (not instant) oatmeal made with almond/rice milk in am - order the dressing on the side when eating salad at a restaurant (pour less than half of the dressing on the salad) - eat as little meat as possible - can try juicing, but should not forget that juicing will get rid of the fiber, so would alternate with eating raw veg./fruits or drinking smoothies - use as little oil as possible, even when using olive oil - can dress a salad with a mix of balsamic vinegar and lemon juice, for e.g. - use agave nectar, stevia sugar, or regular sugar rather than artificial sweateners - steam or broil/roast veggies  - snack on veggies/fruit/nuts (unsalted, preferably) when possible, rather than processed foods - reduce or eliminate aspartame in diet (it is in diet sodas, chewing gum, etc) Read the labels!  Try to read Dr. Janene Harvey book: "Program for Reversing Diabetes" for other ideas for healthy eating.

## 2017-07-28 NOTE — Progress Notes (Signed)
Patient ID: Jacqueline Cole, female   DOB: [birth year], 67 y.o.   MRN: [id number]   HPI: Jacqueline Cole is a 67 y.o.-year-old female, referred by her PCP, Dr. Silvio Pate, for management of DM2, dx in 1997, insulin-dependent since 2000, uncontrolled, with complications (diabetic retinopathy, mild CKD, peripheral neuropathy).  She is here with her husband who offers part of the history, especially related to her medication doses and symptoms.  Last hemoglobin A1c was: Lab Results  Component Value Date   HGBA1C 8.3 07/28/2017   HGBA1C 8.2 (H) 10/13/2016   HGBA1C 10.5 (H) 05/26/2016   Pt is on a regimen of: - Metformin 1000 mg 2x a day, with meals - Lantus 40 units 2x a day She tried a DPP 4 inhibitor and GLP-1 receptor agonist and she could not tolerate these. On Onglyza >> facial rash on L side of face On Victoza >> nausea, lost 38 lbs in 40 days, sugars 400s as at that time she was taken off Lantus  Pt checks her sugars once a day in the morning: - am: 60, 73- 126, 134 -all with the current meter, 210-with the previous meter - 2h after b'fast: 413 -with the previous meter - before lunch: 77 - 2h after lunch: 377, 420, 500 - all with a previous meter - before dinner: n/c - 2h after dinner: 391 -with the previous meter - bedtime: n/c - nighttime: n/c No lows. Lowest sugar was 54 (on higher insulin dose); she has hypoglycemia awareness at 80.  Highest sugar was 380.  Glucometer: One Touch ultra >> AccuChek now  Pt's meals are: - Breakfast: eggs, , sliced tomatoes - Lunch: sandwich, pretzels - Dinner: meat + 2 veggies - Snacks: fruit, almonds  - + CKD, last BUN/creatinine:  Lab Results  Component Value Date   BUN 10 12/13/2016   BUN 9 11/26/2015   CREATININE 1.18 12/13/2016   CREATININE 1.03 11/26/2015   -+ HL; last set of lipids: Lab Results  Component Value Date   CHOL 275 (H) 12/13/2016   HDL 35.00 (L) 12/13/2016   LDLCALC 76 09/25/2013   LDLDIRECT 169.0 12/13/2016   TRIG  400.0 (H) 12/13/2016   CHOLHDL 8 12/13/2016  On pravastatin 80. - last eye exam was in 04/2017. + DR. + laser Sx and IO inj's. Dr. Zigmund Daniel. - + numbness and tingling in her feet. On Alleve/Advil.  Pt has no FH of DM.  She has a history of hysterectomy in 2001.  ROS: Constitutional: + weight gain, + fatigue, + hot flushes, + poor sleep, no nocturia Eyes: + blurry vision, no xerophthalmia ENT: + sore throat, no nodules palpated in throat, no dysphagia/odynophagia, no hoarseness, + tinnitus, + hypoacusis Cardiovascular: no CP/+ SOB/+ palpitations/no leg swelling Respiratory: + cough/+ SOB Gastrointestinal: + N/no V/D/+ C, no heartburn Musculoskeletal: + muscle aches/no joint aches Skin: + rash on face, + hair loss Neurological: no tremors/numbness/tingling/dizziness, + HA Psychiatric: + both: depression/anxiety Psychiatric: + depression/+ anxiety + Low libido  Past Medical History:  Diagnosis Date  . Depression   . Diabetes mellitus type II   . Hyperlipemia   . Obesity   . Osteopenia   . Syncope 1998   DM diagnosis   Past Surgical History:  Procedure Laterality Date  . ABDOMINAL HYSTERECTOMY  ~2005   and BSO  . CHOLECYSTECTOMY  1975  . COSMETIC SURGERY     Injury Right face as a child  . VAGINAL DELIVERY     X 2   Social  History   Socioeconomic History  . Marital status: Married    Spouse name: Not on file  . Number of children: 2  . Years of education: Not on file  . Highest education level: Not on file  Occupational History    Comment: "Just up and quit" at [employer]  . Financial resource strain: Not on file  . Food insecurity:    Worry: Not on file    Inability: Not on file  . Transportation needs:    Medical: Not on file    Non-medical: Not on file  Tobacco Use  . Smoking status: Former Smoker    Last attempt to quit: 03/15/1993    Years since quitting: 24.3  . Smokeless tobacco: Never Used  Substance and Sexual Activity  . Alcohol use:  No    Alcohol/week: 0.0 oz  . Drug use: No  . Sexual activity: Not on file  Lifestyle  . Physical activity:    Days per week: Not on file    Minutes per session: Not on file  . Stress: Not on file  Relationships  . Social connections:    Talks on phone: Not on file    Gets together: Not on file    Attends religious service: Not on file    Active member of club or organization: Not on file    Attends meetings of clubs or organizations: Not on file    Relationship status: Not on file  . Intimate partner violence:    Fear of current or ex partner: Not on file    Emotionally abused: Not on file    Physically abused: Not on file    Forced sexual activity: Not on file  Other Topics Concern  . Not on file  Social History Narrative   Has living will   Husband, then daughter [name], have health care POA   Would reluctantly accept resuscitation but no life prolonging measures   Current Outpatient Medications on File Prior to Visit  Medication Sig Dispense Refill  . ACCU-CHEK SOFTCLIX LANCETS lancets Use 1 lancet daily. Dx Code E11.49 100 each 3  . FLUoxetine (PROZAC) 20 MG capsule Take 1 capsule (20 mg total) by mouth daily. 90 capsule 3  . glucose blood (ACCU-CHEK AVIVA PLUS) test strip Use to check blood sugar once a day. Dx code E11.49, E11.65 100 each 12  . ibuprofen (ADVIL,MOTRIN) 200 MG tablet Take 200 mg by mouth every 6 (six) hours as needed.    . Insulin Syringe-Needle U-100 25G X 5/8" 1 ML MISC by Does not apply route. Use daily as directed (may substitute for insurance)     . LANTUS 100 UNIT/ML injection Inject 0.6 mLs (60 Units total) into the skin 2 (two) times daily. Dx: E11.41 13 vial 3  . metFORMIN (GLUCOPHAGE) 1000 MG tablet TAKE 1 TABLET BY MOUTH 2 TIMES DAILY. 180 tablet 3  . naproxen sodium (ALEVE) 220 MG tablet Take 220 mg by mouth.    . venlafaxine XR (EFFEXOR-XR) 150 MG 24 hr capsule TAKE 1 CAPSULE TWICE DAILY 180 capsule 3   No current facility-administered  medications on file prior to visit.    Allergies  Allergen Reactions  . Onglyza [Saxagliptin] Hives and Nausea And Vomiting  . Liraglutide Nausea Only    Can't eat, nausea, higher sugars   Family History  Problem Relation Age of Onset  . Stroke Father        CVA  . Heart disease Father  MI  . Cancer Sister        breast cancer  . Breast cancer Sister 57  . Heart disease Brother        MI  . Cancer Brother        Lung    PE: BP 134/84 (BP Location: Left Arm, Patient Position: Sitting, Cuff Size: Normal)   Pulse 91   Ht 5\' 5"  (1.651 m)   Wt 234 lb (106.1 kg)   SpO2 96%   BMI 38.94 kg/m  Wt Readings from Last 3 Encounters:  07/28/17 234 lb (106.1 kg)  12/13/16 233 lb (105.7 kg)  10/13/16 229 lb (103.9 kg)   Constitutional: Obese, in NAD Eyes: PERRLA, EOMI, no exophthalmos ENT: moist mucous membranes, no thyromegaly, no cervical lymphadenopathy Cardiovascular: RRR, No MRG Respiratory: CTA B Gastrointestinal: abdomen soft, NT, ND, BS+ Musculoskeletal: no deformities, strength intact in all 4 Skin: moist, warm, no rashes Neurological: no tremor with outstretched hands, DTR normal in all 4  ASSESSMENT: 1. DM2, insulin-dependent, uncontrolled, with complications - DR - PN - mild CKD  PLAN:  1. Patient with long-standing, uncontrolled diabetes, on oral antidiabetic regimen and also basal insulin, with almost all sugars at goal per review of her newest meter download, however, with very high sugars in her old meter, which covers from 02-06/2017.  Her sugars in that.  Were mostly high, even up to 500s.  As of now, with a new meter, starting in 07/2017, she only checks sugars in the morning and they are all excellent.  HbA1c today is 8.3%, which is higher than expected from the sugars, however, I am not sure if her sugars are greatly increasing towards the second half of the day or they improved in the last month. - As of now, we discussed about covering her meals  better, and I suggested a GLP-1 receptor agonist (she still has Victoza at home).  She was on Victoza before and she could not tolerate it due to nausea but also increase in blood sugars to the 500s.  However, at that time, she was off the insulin and I think that was the main problem.  She is willing to restart the Victoza and will start with a conservative dose of 0.5 mg and only go up to 1.2 mg after 5 days if she can tolerate this dose well.  When she finishes Victoza, we can go to weekly injectables like Trulicity or Ozempic (I hope these are covered) - We will continue the metformin at the current dose and she does not have any side effects from it - As we are adding Victoza and since her sugars are excellent in the morning, I advised her to decrease her dose of Lantus more, to 30 units twice a day - I suggested to:  Patient Instructions  Please continue: - Metformin 1000 mg 2x a day with meals  Please decrease: - Lantus to 30 units 2x a day  Please start Victoza 0.6 mg daily before b'fast. If you tolerate this well, increase to 1.2 mg.   Please let me know if the sugars are consistently <80 or >200.  Please return in 3 months with your sugar log.   - Strongly advised her to start checking sugars at different times of the day - check 2 times a day, rotating checks - given sugar log and advised how to fill it and to bring it at next appt  - given foot care handout and explained the principles  -  given instructions for hypoglycemia management "15-15 rule"  - advised for yearly eye exams  - Return to clinic in 3 mo with sugar log   Philemon Kingdom, MD PhD [facility]

## 2017-07-29 ENCOUNTER — Encounter (INDEPENDENT_AMBULATORY_CARE_PROVIDER_SITE_OTHER): Payer: Medicare HMO | Admitting: Ophthalmology

## 2017-07-29 DIAGNOSIS — H2513 Age-related nuclear cataract, bilateral: Secondary | ICD-10-CM

## 2017-07-29 DIAGNOSIS — H43813 Vitreous degeneration, bilateral: Secondary | ICD-10-CM

## 2017-07-29 DIAGNOSIS — E11311 Type 2 diabetes mellitus with unspecified diabetic retinopathy with macular edema: Secondary | ICD-10-CM | POA: Diagnosis not present

## 2017-07-29 DIAGNOSIS — E113513 Type 2 diabetes mellitus with proliferative diabetic retinopathy with macular edema, bilateral: Secondary | ICD-10-CM | POA: Diagnosis not present

## 2017-08-09 ENCOUNTER — Encounter: Payer: Self-pay | Admitting: Internal Medicine

## 2017-08-09 ENCOUNTER — Ambulatory Visit (INDEPENDENT_AMBULATORY_CARE_PROVIDER_SITE_OTHER): Payer: Medicare HMO | Admitting: Internal Medicine

## 2017-08-09 VITALS — BP 138/78 | HR 91 | Temp 97.8°F | Ht 65.0 in | Wt 227.0 lb

## 2017-08-09 DIAGNOSIS — E1149 Type 2 diabetes mellitus with other diabetic neurological complication: Secondary | ICD-10-CM | POA: Diagnosis not present

## 2017-08-09 DIAGNOSIS — F331 Major depressive disorder, recurrent, moderate: Secondary | ICD-10-CM | POA: Diagnosis not present

## 2017-08-09 DIAGNOSIS — IMO0002 Reserved for concepts with insufficient information to code with codable children: Secondary | ICD-10-CM

## 2017-08-09 DIAGNOSIS — E1165 Type 2 diabetes mellitus with hyperglycemia: Secondary | ICD-10-CM

## 2017-08-09 NOTE — Assessment & Plan Note (Signed)
Improved Tolerating the liragultide
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
## 2017-08-09 NOTE — Assessment & Plan Note (Signed)
BMI 37 with diabetes, arthritis, HLD She has lost some weight and is tolerating the victoza now

## 2017-08-31 ENCOUNTER — Other Ambulatory Visit: Payer: Self-pay

## 2017-08-31 ENCOUNTER — Encounter: Payer: Self-pay | Admitting: Internal Medicine

## 2017-08-31 MED ORDER — LIRAGLUTIDE 18 MG/3ML ~~LOC~~ SOPN: PEN_INJECTOR | SUBCUTANEOUS | 3 refills | Status: DC

## 2017-09-02 ENCOUNTER — Encounter: Payer: Self-pay | Admitting: Internal Medicine

## 2017-09-05 ENCOUNTER — Telehealth: Payer: Self-pay | Admitting: Internal Medicine

## 2017-09-05 ENCOUNTER — Encounter: Payer: Self-pay | Admitting: Internal Medicine

## 2017-09-05 NOTE — Telephone Encounter (Signed)
Patient stated that pharmacy [facility] haven't received medication Victoza. Please advise  [place], [place] DEA #:

## 2017-09-06 ENCOUNTER — Other Ambulatory Visit: Payer: Self-pay | Admitting: Internal Medicine

## 2017-09-06 ENCOUNTER — Telehealth: Payer: Self-pay | Admitting: Internal Medicine

## 2017-09-06 NOTE — Telephone Encounter (Signed)
Patient Husband is calling on the status of medication Victoza, patient is totally out. Please advise  [place] [address] [place]

## 2017-09-07 ENCOUNTER — Other Ambulatory Visit: Payer: Self-pay

## 2017-09-07 MED ORDER — LIRAGLUTIDE 18 MG/3ML ~~LOC~~ SOPN: PEN_INJECTOR | SUBCUTANEOUS | 3 refills | Status: DC

## 2017-09-07 NOTE — Telephone Encounter (Signed)
This has been resent

## 2017-09-07 NOTE — Telephone Encounter (Signed)
This has been ordered 

## 2017-09-08 ENCOUNTER — Encounter: Payer: Self-pay | Admitting: Internal Medicine

## 2017-09-09 ENCOUNTER — Other Ambulatory Visit: Payer: Self-pay

## 2017-09-09 ENCOUNTER — Encounter: Payer: Self-pay | Admitting: Internal Medicine

## 2017-09-09 MED ORDER — LIRAGLUTIDE 18 MG/3ML ~~LOC~~ SOPN: PEN_INJECTOR | SUBCUTANEOUS | 3 refills | Status: DC

## 2017-09-30 ENCOUNTER — Encounter (INDEPENDENT_AMBULATORY_CARE_PROVIDER_SITE_OTHER): Payer: Medicare HMO | Admitting: Ophthalmology

## 2017-09-30 DIAGNOSIS — H43813 Vitreous degeneration, bilateral: Secondary | ICD-10-CM

## 2017-09-30 DIAGNOSIS — E11311 Type 2 diabetes mellitus with unspecified diabetic retinopathy with macular edema: Secondary | ICD-10-CM | POA: Diagnosis not present

## 2017-09-30 DIAGNOSIS — E113513 Type 2 diabetes mellitus with proliferative diabetic retinopathy with macular edema, bilateral: Secondary | ICD-10-CM

## 2017-09-30 DIAGNOSIS — H2513 Age-related nuclear cataract, bilateral: Secondary | ICD-10-CM | POA: Diagnosis not present

## 2017-11-01 ENCOUNTER — Encounter: Payer: Self-pay | Admitting: Internal Medicine

## 2017-11-01 ENCOUNTER — Ambulatory Visit: Payer: Medicare HMO | Admitting: Internal Medicine

## 2017-11-01 VITALS — BP 136/78 | HR 98 | Ht 65.0 in | Wt 222.2 lb

## 2017-11-01 DIAGNOSIS — E1165 Type 2 diabetes mellitus with hyperglycemia: Secondary | ICD-10-CM | POA: Diagnosis not present

## 2017-11-01 DIAGNOSIS — E785 Hyperlipidemia, unspecified: Secondary | ICD-10-CM | POA: Diagnosis not present

## 2017-11-01 DIAGNOSIS — E1149 Type 2 diabetes mellitus with other diabetic neurological complication: Secondary | ICD-10-CM | POA: Diagnosis not present

## 2017-11-01 DIAGNOSIS — E669 Obesity, unspecified: Secondary | ICD-10-CM

## 2017-11-01 DIAGNOSIS — IMO0002 Reserved for concepts with insufficient information to code with codable children: Secondary | ICD-10-CM

## 2017-11-01 LAB — POCT GLYCOSYLATED HEMOGLOBIN (HGB A1C): Hemoglobin A1C: 7.5 % — AB (ref 4.0–5.6)

## 2017-11-01 NOTE — Progress Notes (Signed)
Patient ID: Jacqueline Cole, female   DOB: 09/20/1950, 67 y.o.   MRN: [id number]   HPI: Jacqueline Cole is a 67 y.o.-year-old female, returning for follow-up for DM2, dx in 1997, insulin-dependent since 2000, uncontrolled, with complications (diabetic retinopathy, mild CKD, peripheral neuropathy).  She is here with her husband who offers part of the history especially related to her medication doses and symptoms.  Last visit 3 months ago.  At this visit, she tells me that she is feeling much better than before, after starting Victoza.  She has no nausea with this and her sugars have improved, even though she is at the lowest dose.  She also lost weight and has more energy and started to walk a little more.  She does complain of headaches related to her intraocular injections.  Last hemoglobin A1c was: Lab Results  Component Value Date   HGBA1C 8.3 07/28/2017   HGBA1C 8.2 (H) 10/13/2016   HGBA1C 10.5 (H) 05/26/2016   Pt is on a regimen of: - Metformin 1000 mg 2x a day, with meals - Lantus 40 units 2x a day >> 30 units 2x a day - Victoza 0.6 mg daily - started 07/2017 >> tolerates this well now  She tried a DPP 4 inhibitor and GLP-1 receptor agonist before: On Victoza >> nausea, lost 38 lbs in 40 days, sugars 400s as at that time she was taken off Lantus On Onglyza >> facial rash on L side of face  Pt checks her sugars 1-2x a day: - am: 60, 73- 126, 134 -all with the current meter, 210-with the previous meter >> 108-131, 159 - 2h after b'fast: 413 -with the previous meter >> n/c - before lunch: 77 >> 105, 184, 196 - 2h after lunch: 377, 420, 500 - all with a previous meter >> n/c - before dinner: n/c >> 92, 156-176, 220 - 2h after dinner: 391 -with the previous meter >> n/c - bedtime: n/c - nighttime: n/c Lowest sugar was 54 (on higher insulin dose) >> 92; she has hypoglycemia awareness in the 80s. Highest sugar was 380 >> 220 (icecream).  Glucometer: One Touch ultra >> Accu-Chek  Pt's  meals are: - Breakfast: eggs, , sliced tomatoes - Lunch: sandwich, pretzels - Dinner: meat + 2 veggies - Snacks: fruit, almonds  - + CKD, last BUN/creatinine:  Lab Results  Component Value Date   BUN 10 12/13/2016   BUN 9 11/26/2015   CREATININE 1.18 12/13/2016   CREATININE 1.03 11/26/2015   -+ HL; last set of lipids: Lab Results  Component Value Date   CHOL 275 (H) 12/13/2016   HDL 35.00 (L) 12/13/2016   LDLCALC 76 09/25/2013   LDLDIRECT 169.0 12/13/2016   TRIG 400.0 (H) 12/13/2016   CHOLHDL 8 12/13/2016  On pravastatin 80. - last eye exam 04/2017: + DR; she has a history of  surgery and IO inj's (HAs).  Dr. Zigmund Daniel. - + numbness and tingling in her feet. On Alleve/Advil.  Pt has no FH of DM.  She has a history of hysterectomy in 2001.  ROS: Constitutional: + weight loss, no fatigue, no subjective hyperthermia, no subjective hypothermia Eyes: no blurry vision, no xerophthalmia ENT: no sore throat, no nodules palpated in throat, no dysphagia, no odynophagia, no hoarseness Cardiovascular: no CP/no SOB/no palpitations/no leg swelling Respiratory: no cough/no SOB/no wheezing Gastrointestinal: no N/no V/no D/no C/no acid reflux Musculoskeletal: no muscle aches/no joint aches Skin: no rashes, no hair loss Neurological: no tremors/+ numbness/+ tingling/no dizziness, + HAs  I reviewed pt's medications, allergies, PMH, social hx, family hx, and changes were documented in the history of present illness. Otherwise, unchanged from my initial visit note.  Past Medical History:  Diagnosis Date  . Depression   . Diabetes mellitus type II   . Hyperlipemia   . Major depressive disorder, recurrent, in remission ([place]) 09/01/2006   Qualifier: Diagnosis of  By: Lelon Mast    . Obesity   . Osteopenia   . Syncope 1998   DM diagnosis   Past Surgical History:  Procedure Laterality Date  . ABDOMINAL HYSTERECTOMY  ~2005   and BSO  . CHOLECYSTECTOMY  1975  . COSMETIC SURGERY      Injury Right face as a child  . VAGINAL DELIVERY     X 2   Social History   Socioeconomic History  . Marital status: Married    Spouse name: Not on file  . Number of children: 2  . Years of education: Not on file  . Highest education level: Not on file  Occupational History    Comment: "Just up and quit" at [employer]  . Financial resource strain: Not on file  . Food insecurity:    Worry: Not on file    Inability: Not on file  . Transportation needs:    Medical: Not on file    Non-medical: Not on file  Tobacco Use  . Smoking status: Former Smoker    Last attempt to quit: 03/15/1993    Years since quitting: 24.6  . Smokeless tobacco: Never Used  Substance and Sexual Activity  . Alcohol use: No    Alcohol/week: 0.0 standard drinks  . Drug use: No  . Sexual activity: Not on file  Lifestyle  . Physical activity:    Days per week: Not on file    Minutes per session: Not on file  . Stress: Not on file  Relationships  . Social connections:    Talks on phone: Not on file    Gets together: Not on file    Attends religious service: Not on file    Active member of club or organization: Not on file    Attends meetings of clubs or organizations: Not on file    Relationship status: Not on file  . Intimate partner violence:    Fear of current or ex partner: Not on file    Emotionally abused: Not on file    Physically abused: Not on file    Forced sexual activity: Not on file  Other Topics Concern  . Not on file  Social History Narrative   Has living will   Husband, then daughter [name], have health care POA   Would reluctantly accept resuscitation but no life prolonging measures   Current Outpatient Medications on File Prior to Visit  Medication Sig Dispense Refill  . ACCU-CHEK SOFTCLIX LANCETS lancets Use 1 lancet daily. Dx Code E11.49 100 each 3  . FLUoxetine (PROZAC) 20 MG capsule Take 1 capsule (20 mg total) by mouth daily. 90 capsule 3  . glucose blood  (ACCU-CHEK AVIVA PLUS) test strip Use to check blood sugar once a day. Dx code E11.49, E11.65 100 each 12  . ibuprofen (ADVIL,MOTRIN) 200 MG tablet Take 200 mg by mouth every 6 (six) hours as needed.    . Insulin Syringe-Needle U-100 25G X 5/8" 1 ML MISC by Does not apply route. Use daily as directed (may substitute for insurance)     . LANTUS 100 UNIT/ML  injection Inject 0.3 mLs (30 Units total) into the skin 2 (two) times daily. Dx: E11.41 3 vial 3  . liraglutide (VICTOZA) 18 MG/3ML SOPN Inject under skin 1.2 mg daily in am 9 mL 3  . metFORMIN (GLUCOPHAGE) 1000 MG tablet TAKE 1 TABLET BY MOUTH 2 TIMES DAILY. 180 tablet 3  . naproxen sodium (ALEVE) 220 MG tablet Take 220 mg by mouth.    . venlafaxine XR (EFFEXOR-XR) 150 MG 24 hr capsule TAKE 1 CAPSULE TWICE DAILY 180 capsule 3   No current facility-administered medications on file prior to visit.    Allergies  Allergen Reactions  . Onglyza [Saxagliptin] Hives and Nausea And Vomiting  . Liraglutide Nausea Only    Can't eat, nausea, higher sugars   Family History  Problem Relation Age of Onset  . Stroke Father        CVA  . Heart disease Father        MI  . Cancer Sister        breast cancer  . Breast cancer Sister 24  . Heart disease Brother        MI  . Cancer Brother        Lung    PE: BP 136/78   Pulse 98   Ht 5\' 5"  (1.651 m)   Wt 222 lb 3.2 oz (100.8 kg)   SpO2 94%   BMI 36.98 kg/m  Wt Readings from Last 3 Encounters:  11/01/17 222 lb 3.2 oz (100.8 kg)  08/09/17 227 lb (103 kg)  07/28/17 234 lb (106.1 kg)   Constitutional: overweight, in NAD Eyes: PERRLA, EOMI, no exophthalmos ENT: moist mucous membranes, no thyromegaly, no cervical lymphadenopathy Cardiovascular: tachycardia, RR, No MRG Respiratory: CTA B Gastrointestinal: abdomen soft, NT, ND, BS+ Musculoskeletal: no deformities, strength intact in all 4 Skin: moist, warm, no rashes Neurological: no tremor with outstretched hands, DTR normal in all  4  ASSESSMENT: 1. DM2, insulin-dependent, uncontrolled, with complications - DR - PN - mild CKD  2. HL  3. Obesity class II BMI Classification:  < 18.5 underweight   18.5-24.9 normal weight   25.0-29.9 overweight   30.0-34.9 class I obesity   35.0-39.9 class II obesity   ? 40.0 class III obesity   PLAN:  1. Patient with long-standing, uncontrolled, type 2 diabetes, on oral antidiabetic regimen and basal insulin, to which we added a GLP-1 receptor agonist at last visit (Victoza).  At last visit (first visit with me), HbA1c was 8.3%, and she was only checking sugars in the morning and I advised her to also check later in the day.  Therefore, we added Victoza, which she took in the past, and she had some nausea with it, so I advised her to only increase the dose to 1.2 mg daily.  When she was on Victoza in the past, her sugars are high, however, she was off insulin at that time, and I think this was the problem.  As we added Victoza at last visit, we also decreased her Lantus dose. - At this visit, she is still on the lowest dose of Victoza, but she feels a big difference in her sugars, her weight (lost 12 pounds), and her energy.  She is very happy with this and she is ready for a dose increase.  I advised her to increase slowly, by 5 clicks at a time until she gets to the target dose of 1.8 mg daily. - Reviewing her meter downloads, she does not have any low  blood sugars, but her sugars still increase in a stepwise fashion throughout the day.  For now, we will continue the same dose of Lantus, but I plan to decrease this and combine it in only 1 dose at next visit. - I suggested to:  Patient Instructions  Please continue: - Metformin 1000 mg 2x a day with meals - Lantus 30 units 2x a day  Please increase Victoza as tolerated, to 1.8 mg daily.  Please return in 3 months with your sugar log.   - today, HbA1c is 7.5% (better!) - continue checking sugars at different times of the  day - check 2x a day, rotating checks - advised for yearly eye exams >> she is UTD - Return to clinic in 3 mo with sugar log   2. HL - Reviewed latest lipid panel from 12/2016: All cholesterol fractions abnormal, with a higher LDL of 169 Lab Results  Component Value Date   CHOL 275 (H) 12/13/2016   HDL 35.00 (L) 12/13/2016   LDLCALC 76 09/25/2013   LDLDIRECT 169.0 12/13/2016   TRIG 400.0 (H) 12/13/2016   CHOLHDL 8 12/13/2016  - Continues the statin without side effects. - I believe that improving her diabetes control will also help her uncontrolled hyperlipidemia - I also advised her to stop ice cream - She is due for lipid panel soon -she has an annual physical coming up with her PCP in 2 months.   3. Obesity class II -Lost 12 pounds after we started Victoza, even at the lowest dose.  This is very encouraging.  We will increase the Victoza dose.  I also advised her to increase her walking (she used to walk 4 miles a day before and she would like to build up to that) and also cut down concentrated sweets including ice cream.  Philemon Kingdom, MD PhD [facility]

## 2017-11-01 NOTE — Addendum Note (Signed)
Addended by: Drucilla Schmidt on: 11/01/2017 03:40 PM   Modules accepted: Orders

## 2017-11-01 NOTE — Patient Instructions (Signed)
Patient Instructions  Please continue: - Metformin 1000 mg 2x a day with meals - Lantus 30 units 2x a day  Please increase Victoza as tolerated, to 1.8 mg daily.  Please return in 3 months with your sugar log.

## 2017-11-02 ENCOUNTER — Encounter: Payer: Self-pay | Admitting: Internal Medicine

## 2017-11-02 ENCOUNTER — Ambulatory Visit (INDEPENDENT_AMBULATORY_CARE_PROVIDER_SITE_OTHER): Payer: Medicare HMO | Admitting: Internal Medicine

## 2017-11-02 VITALS — BP 110/72 | HR 95 | Temp 97.6°F | Ht 65.0 in | Wt 222.0 lb

## 2017-11-02 DIAGNOSIS — E1165 Type 2 diabetes mellitus with hyperglycemia: Secondary | ICD-10-CM | POA: Diagnosis not present

## 2017-11-02 DIAGNOSIS — IMO0002 Reserved for concepts with insufficient information to code with codable children: Secondary | ICD-10-CM

## 2017-11-02 DIAGNOSIS — F331 Major depressive disorder, recurrent, moderate: Secondary | ICD-10-CM

## 2017-11-02 DIAGNOSIS — E1149 Type 2 diabetes mellitus with other diabetic neurological complication: Secondary | ICD-10-CM | POA: Diagnosis not present

## 2017-11-02 MED ORDER — VENLAFAXINE HCL ER 150 MG PO CP24: 150.0000 mg | ORAL_CAPSULE | Freq: Two times a day (BID) | ORAL | 3 refills | Status: DC

## 2017-11-02 NOTE — Progress Notes (Signed)
Subjective:    Patient ID: Jacqueline Cole, female    DOB: 10-21-1950, 67 y.o.   MRN: [id number]  HPI Here for follow up of depression Really happy about the A1c down to 7.5%  Ongoing problems with her eyes So scared of losing her eyesight Still getting shots in each eye Has "pulse in brain" ever since injection on the left side  Depression is better Sleeping okay  Current Outpatient Medications on File Prior to Visit  Medication Sig Dispense Refill  . ACCU-CHEK SOFTCLIX LANCETS lancets Use 1 lancet daily. Dx Code E11.49 100 each 3  . FLUoxetine (PROZAC) 20 MG capsule Take 1 capsule (20 mg total) by mouth daily. 90 capsule 3  . glucose blood (ACCU-CHEK AVIVA PLUS) test strip Use to check blood sugar once a day. Dx code E11.49, E11.65 100 each 12  . ibuprofen (ADVIL,MOTRIN) 200 MG tablet Take 200 mg by mouth every 6 (six) hours as needed.    . Insulin Syringe-Needle U-100 25G X 5/8" 1 ML MISC by Does not apply route. Use daily as directed (may substitute for insurance)     . LANTUS 100 UNIT/ML injection Inject 0.3 mLs (30 Units total) into the skin 2 (two) times daily. Dx: E11.41 3 vial 3  . liraglutide (VICTOZA) 18 MG/3ML SOPN Inject under skin 1.2 mg daily in am 9 mL 3  . metFORMIN (GLUCOPHAGE) 1000 MG tablet TAKE 1 TABLET BY MOUTH 2 TIMES DAILY. 180 tablet 3  . naproxen sodium (ALEVE) 220 MG tablet Take 220 mg by mouth.    . venlafaxine XR (EFFEXOR-XR) 150 MG 24 hr capsule TAKE 1 CAPSULE TWICE DAILY 180 capsule 3   No current facility-administered medications on file prior to visit.     Allergies  Allergen Reactions  . Onglyza [Saxagliptin] Hives and Nausea And Vomiting  . Liraglutide Nausea Only    Can't eat, nausea, higher sugars    Past Medical History:  Diagnosis Date  . Depression   . Diabetes mellitus type II   . Hyperlipemia   . Major depressive disorder, recurrent, in remission ([place]) 09/01/2006   Qualifier: Diagnosis of  By: Lelon Mast    . Obesity   .  Osteopenia   . Syncope 1998   DM diagnosis    Past Surgical History:  Procedure Laterality Date  . ABDOMINAL HYSTERECTOMY  ~2005   and BSO  . CHOLECYSTECTOMY  1975  . COSMETIC SURGERY     Injury Right face as a child  . VAGINAL DELIVERY     X 2    Family History  Problem Relation Age of Onset  . Stroke Father        CVA  . Heart disease Father        MI  . Cancer Sister        breast cancer  . Breast cancer Sister 77  . Heart disease Brother        MI  . Cancer Brother        Lung    Social History   Socioeconomic History  . Marital status: Married    Spouse name: Not on file  . Number of children: 2  . Years of education: Not on file  . Highest education level: Not on file  Occupational History    Comment: "Just up and quit" at [employer]  . Financial resource strain: Not on file  . Food insecurity:    Worry: Not on file  Inability: Not on file  . Transportation needs:    Medical: Not on file    Non-medical: Not on file  Tobacco Use  . Smoking status: Former Smoker    Last attempt to quit: 03/15/1993    Years since quitting: 24.6  . Smokeless tobacco: Never Used  Substance and Sexual Activity  . Alcohol use: No    Alcohol/week: 0.0 standard drinks  . Drug use: No  . Sexual activity: Not on file  Lifestyle  . Physical activity:    Days per week: Not on file    Minutes per session: Not on file  . Stress: Not on file  Relationships  . Social connections:    Talks on phone: Not on file    Gets together: Not on file    Attends religious service: Not on file    Active member of club or organization: Not on file    Attends meetings of clubs or organizations: Not on file    Relationship status: Not on file  . Intimate partner violence:    Fear of current or ex partner: Not on file    Emotionally abused: Not on file    Physically abused: Not on file    Forced sexual activity: Not on file  Other Topics Concern  . Not on file  Social  History Narrative   Has living will   Husband, then daughter [name], have health care POA   Would reluctantly accept resuscitation but no life prolonging measures   Review of Systems Appetite is "gone"---but trying to eat healthy Weight down slightly    Objective:   Physical Exam  Constitutional: She appears well-developed. No distress.  Psychiatric:  Upbeat and good spirits Appropriate conversation and appearance Affect is upbeat           Assessment & Plan:

## 2017-11-02 NOTE — Assessment & Plan Note (Signed)
Lab Results  Component Value Date   HGBA1C 7.5 (A) 11/01/2017   Much better Great that she is tolerating the liraglutide

## 2017-11-02 NOTE — Assessment & Plan Note (Signed)
Feeling better now Will continue current meds Will hold off on counseling

## 2017-12-01 ENCOUNTER — Encounter (INDEPENDENT_AMBULATORY_CARE_PROVIDER_SITE_OTHER): Payer: Medicare HMO | Admitting: Ophthalmology

## 2017-12-01 DIAGNOSIS — H43813 Vitreous degeneration, bilateral: Secondary | ICD-10-CM | POA: Diagnosis not present

## 2017-12-01 DIAGNOSIS — H2513 Age-related nuclear cataract, bilateral: Secondary | ICD-10-CM | POA: Diagnosis not present

## 2017-12-01 DIAGNOSIS — E11311 Type 2 diabetes mellitus with unspecified diabetic retinopathy with macular edema: Secondary | ICD-10-CM | POA: Diagnosis not present

## 2017-12-01 DIAGNOSIS — E113513 Type 2 diabetes mellitus with proliferative diabetic retinopathy with macular edema, bilateral: Secondary | ICD-10-CM | POA: Diagnosis not present

## 2018-01-26 ENCOUNTER — Encounter (INDEPENDENT_AMBULATORY_CARE_PROVIDER_SITE_OTHER): Payer: Medicare HMO | Admitting: Ophthalmology

## 2018-02-01 ENCOUNTER — Encounter: Payer: Self-pay | Admitting: Internal Medicine

## 2018-02-01 ENCOUNTER — Ambulatory Visit: Payer: Medicare HMO | Admitting: Internal Medicine

## 2018-02-01 VITALS — BP 108/70 | HR 86 | Ht 65.0 in | Wt 209.0 lb

## 2018-02-01 DIAGNOSIS — E785 Hyperlipidemia, unspecified: Secondary | ICD-10-CM | POA: Diagnosis not present

## 2018-02-01 DIAGNOSIS — E1149 Type 2 diabetes mellitus with other diabetic neurological complication: Secondary | ICD-10-CM | POA: Diagnosis not present

## 2018-02-01 DIAGNOSIS — E669 Obesity, unspecified: Secondary | ICD-10-CM | POA: Diagnosis not present

## 2018-02-01 DIAGNOSIS — E1165 Type 2 diabetes mellitus with hyperglycemia: Secondary | ICD-10-CM

## 2018-02-01 DIAGNOSIS — IMO0002 Reserved for concepts with insufficient information to code with codable children: Secondary | ICD-10-CM

## 2018-02-01 DIAGNOSIS — F419 Anxiety disorder, unspecified: Secondary | ICD-10-CM | POA: Diagnosis not present

## 2018-02-01 LAB — TSH: TSH: 3.85 u[IU]/mL (ref 0.35–4.50)

## 2018-02-01 LAB — POCT GLYCOSYLATED HEMOGLOBIN (HGB A1C): Hemoglobin A1C: 6.7 % — AB (ref 4.0–5.6)

## 2018-02-01 MED ORDER — LANTUS 100 UNIT/ML ~~LOC~~ SOLN: SUBCUTANEOUS | 3 refills | Status: DC

## 2018-02-01 NOTE — Progress Notes (Signed)
Patient ID: Jacqueline Cole, female   DOB: 01-11-1951, 67 y.o.   MRN: [id number]   HPI: Jacqueline Cole is a 67 y.o.-year-old female, returning for follow-up for DM2, dx in 1997, insulin-dependent since 2000, uncontrolled, with complications (diabetic retinopathy, mild CKD, peripheral neuropathy).  She is here with her husband who offers part of the history especially related to medication doses and symptoms. Last visit 3 months ago.  At this visit, she complains of anxiety and fatigue.  Last hemoglobin A1c was: Lab Results  Component Value Date   HGBA1C 7.5 (A) 11/01/2017   HGBA1C 8.3 07/28/2017   HGBA1C 8.2 (H) 10/13/2016   Pt is on a regimen of: - Metformin 1000 mg 2x a day, with meals - Lantus 60 units 2x a day >>  40 units 2x a day >> 30 units 2x a day - Victoza 0.6 mg daily - started 07/2017 >> increased to 0.6 mg daily + 3 clicks - [id number] this well She was previously on Onglyza-developed facial rash on left side of face.  Pt checks her sugars 1-2 times a day: - am: 60, 73- 126, 134 >> 108-131, 159 >> 64, 83-145 - 2h after b'fast: 413 >> n/c >> 73-147, 168 - before lunch: 77 >> 105, 184, 196 >> 79-127, 140 - 2h after lunch: 377, 420, 500  >> n/c - before dinner: n/c >> 92, 156-176, 220 >> 77, 147 - 2h after dinner: 391 >> n/c >> 204 - bedtime: n/c - nighttime: n/c Lowest sugar was 54 (on higher insulin dose) >> 92 >> 64; she has hypoglycemia awareness in the 80s. Highest sugar was 380 >> 220 (icecream) >> 204 (HA).  Glucometer: One Touch ultra >> Accu-Chek  Pt's meals are: - Breakfast: eggs, , sliced tomatoes - Lunch: sandwich, pretzels - Dinner: meat + 2 veggies - Snacks: fruit, almonds  -+ CKD, last BUN/creatinine:  Lab Results  Component Value Date   BUN 10 12/13/2016   BUN 9 11/26/2015   CREATININE 1.18 12/13/2016   CREATININE 1.03 11/26/2015   -+ HL; last set of lipids: Lab Results  Component Value Date   CHOL 275 (H) 12/13/2016   HDL 35.00 (L)  12/13/2016   LDLCALC 76 09/25/2013   LDLDIRECT 169.0 12/13/2016   TRIG 400.0 (H) 12/13/2016   CHOLHDL 8 12/13/2016  On pravastatin 80. - last eye exam 04/2017: + DR; + history of  surgery and IO inj's (>> headaches from these).  She sees Dr. Rodolph Bong. - + numbness and tingling in her feet.  She takes Aleve/Advil.  Pt has no FH of DM.  She has a history of hysterectomy in 2001.  She was on LT4 as a teenager >> stopped long time ago his TFTs normalized.  ROS: Constitutional: no weight gain/+ weight loss, + fatigue, no subjective hyperthermia, no subjective hypothermia Eyes: no blurry vision, no xerophthalmia ENT: no sore throat, no nodules palpated in neck, no dysphagia, no odynophagia, no hoarseness Cardiovascular: no CP/no SOB/no palpitations/no leg swelling Respiratory: no cough/no SOB/no wheezing Gastrointestinal: no N/no V/no D/no C/no acid reflux Musculoskeletal: no muscle aches/no joint aches Skin: no rashes, no hair loss Neurological: + tremors/+ numbness/+ tingling/no dizziness + anxiety  I reviewed pt's medications, allergies, PMH, social hx, family hx, and changes were documented in the history of present illness. Otherwise, unchanged from my initial visit note.  Past Medical History:  Diagnosis Date  . Depression   . Diabetes mellitus type II   . Hyperlipemia   . Major  depressive disorder, recurrent, in remission ([employer]) 09/01/2006   Qualifier: Diagnosis of  By: Lelon Mast    . Obesity   . Osteopenia   . Syncope 1998   DM diagnosis   Past Surgical History:  Procedure Laterality Date  . ABDOMINAL HYSTERECTOMY  ~2005   and BSO  . CHOLECYSTECTOMY  1975  . COSMETIC SURGERY     Injury Right face as a child  . VAGINAL DELIVERY     X 2   Social History   Socioeconomic History  . Marital status: Married    Spouse name: Not on file  . Number of children: 2  . Years of education: Not on file  . Highest education level: Not on file  Occupational History     Comment: "Just up and quit" at [employer]  . Financial resource strain: Not on file  . Food insecurity:    Worry: Not on file    Inability: Not on file  . Transportation needs:    Medical: Not on file    Non-medical: Not on file  Tobacco Use  . Smoking status: Former Smoker    Last attempt to quit: 03/15/1993    Years since quitting: 24.9  . Smokeless tobacco: Never Used  Substance and Sexual Activity  . Alcohol use: No    Alcohol/week: 0.0 standard drinks  . Drug use: No  . Sexual activity: Not on file  Lifestyle  . Physical activity:    Days per week: Not on file    Minutes per session: Not on file  . Stress: Not on file  Relationships  . Social connections:    Talks on phone: Not on file    Gets together: Not on file    Attends religious service: Not on file    Active member of club or organization: Not on file    Attends meetings of clubs or organizations: Not on file    Relationship status: Not on file  . Intimate partner violence:    Fear of current or ex partner: Not on file    Emotionally abused: Not on file    Physically abused: Not on file    Forced sexual activity: Not on file  Other Topics Concern  . Not on file  Social History Narrative   Has living will   Husband, then daughter [name], have health care POA   Would reluctantly accept resuscitation but no life prolonging measures   Current Outpatient Medications on File Prior to Visit  Medication Sig Dispense Refill  . ACCU-CHEK SOFTCLIX LANCETS lancets Use 1 lancet daily. Dx Code E11.49 100 each 3  . FLUoxetine (PROZAC) 20 MG capsule Take 1 capsule (20 mg total) by mouth daily. 90 capsule 3  . glucose blood (ACCU-CHEK AVIVA PLUS) test strip Use to check blood sugar once a day. Dx code E11.49, E11.65 100 each 12  . ibuprofen (ADVIL,MOTRIN) 200 MG tablet Take 200 mg by mouth every 6 (six) hours as needed.    . Insulin Syringe-Needle U-100 25G X 5/8" 1 ML MISC by Does not apply route. Use daily as  directed (may substitute for insurance)     . LANTUS 100 UNIT/ML injection Inject 0.3 mLs (30 Units total) into the skin 2 (two) times daily. Dx: E11.41 3 vial 3  . liraglutide (VICTOZA) 18 MG/3ML SOPN Inject under skin 1.2 mg daily in am 9 mL 3  . metFORMIN (GLUCOPHAGE) 1000 MG tablet TAKE 1 TABLET BY MOUTH 2 TIMES DAILY.  180 tablet 3  . naproxen sodium (ALEVE) 220 MG tablet Take 220 mg by mouth.    . venlafaxine XR (EFFEXOR-XR) 150 MG 24 hr capsule Take 1 capsule (150 mg total) by mouth 2 (two) times daily. 180 capsule 3   No current facility-administered medications on file prior to visit.    Allergies  Allergen Reactions  . Onglyza [Saxagliptin] Hives and Nausea And Vomiting  . Liraglutide Nausea Only    Can't eat, nausea, higher sugars   Family History  Problem Relation Age of Onset  . Stroke Father        CVA  . Heart disease Father        MI  . Cancer Sister        breast cancer  . Breast cancer Sister 75  . Heart disease Brother        MI  . Cancer Brother        Lung    PE: BP 108/70   Pulse 86   Ht 5\' 5"  (1.651 m) Comment: measured  Wt 209 lb (94.8 kg)   SpO2 98%   BMI 34.78 kg/m  Wt Readings from Last 3 Encounters:  02/01/18 209 lb (94.8 kg)  11/02/17 222 lb (100.7 kg)  11/01/17 222 lb 3.2 oz (100.8 kg)   Constitutional: overweight, in NAD Eyes: PERRLA, EOMI, no exophthalmos ENT: moist mucous membranes, no thyromegaly, no cervical lymphadenopathy Cardiovascular: Tachycardia at the time of the exam, RR, No MRG Respiratory: CTA B Gastrointestinal: abdomen soft, NT, ND, BS+ Musculoskeletal: no deformities, strength intact in all 4 Skin: moist, warm, no rashes Neurological: + tremor with outstretched hands, DTR normal in all 4  ASSESSMENT: 1. DM2, insulin-dependent, uncontrolled, with complications - DR - PN - mild CKD  2. HL  3. Obesity class II BMI Classification:  < 18.5 underweight   18.5-24.9 normal weight   25.0-29.9 overweight    30.0-34.9 class I obesity   35.0-39.9 class II obesity   ? 40.0 class III obesity   4.  Anxiety  PLAN:  1. Patient with long-standing, uncontrolled, type 2 diabetes, on oral antidiabetic regimen with metformin, but also basal insulin and GLP-1 receptor agonist.  At last visit her sugars were much improved after we started Victoza, even on the lowest dose and she also lost a significant amount of weight and she had more energy.  She did not have any low blood sugars then but sugars were still increasing in a stepwise fashion throughout the day.  We increase her dose of Victoza and she is currently taking 0.6 mg + 3 clicks daily.  She feels well on this dose, without GI side effects.   - At this visit, sugars have improved and she only has occasional hyperglycemic spikes.  We discussed about the possibility of increasing Victoza all the way up to 1.2 mg daily as tolerated.  In the meantime, I would like to reduce Lantus dose.  At next visit, I hope that we can pool the Lantus dose and only one dose. - I suggested to:  Patient Instructions  Please change: - Lantus to 15 units in am and 25 units at night   Please increase: - Victoza to 1.2 mg daily in am  Please return in 4 months with your sugar log.   - today, HbA1c is 6.7% (improved) - continue checking sugars at different times of the day - check 1-2x a day, rotating checks - advised for yearly eye exams >> she is UTD -  pending annual labs by PCP in 02/2018 - refuses flu shot today, she would like to have it at next visit with PCP - Return to clinic in 4 mo with sugar log   2. HL - Reviewed latest lipid panel from 12/2016: LDL much higher, at 169, triglycerides also high and HDL low Lab Results  Component Value Date   CHOL 275 (H) 12/13/2016   HDL 35.00 (L) 12/13/2016   LDLCALC 76 09/25/2013   LDLDIRECT 169.0 12/13/2016   TRIG 400.0 (H) 12/13/2016   CHOLHDL 8 12/13/2016  - Continues the statin without side effects. -  Improving her diet and her diabetes will also help her lipid fractions.  At last visit we discussed about stopping ice cream. - She is due for another lipid panel -we will have this at next visit with PCP next month.  3. Obesity class II -She lost 12 pounds after we started Victoza, even at the lowest dose.  Since last visit, she increase Victoza dose slightly but she lost 13 more pounds.  She does mention that she changed her diet by limiting portions.  At this visit I advised her to try to increase Victoza even more.  4.  Anxiety -She mentions that she is more anxious.  She was tachycardic at today's exam, had tremors, and also lost 13 pounds since last visit.  Therefore, I would like to check a TSH today.  If abnormal, I will add a TSH and a free T4.  Office Visit on 02/01/2018  Component Date Value Ref Range Status  . TSH 02/01/2018 3.85  0.35 - 4.50 uIU/mL Final   TSH normal.  Philemon Kingdom, MD PhD [facility]

## 2018-02-01 NOTE — Patient Instructions (Addendum)
Please change: - Lantus to 15 units in am and 25 units at night   Please increase: - Victoza to 1.2 mg daily in am  Please return in 4 months with your sugar log.

## 2018-02-01 NOTE — Addendum Note (Signed)
Addended by: Cardell Peach I on: 02/01/2018 12:56 PM   Modules accepted: Orders

## 2018-02-15 ENCOUNTER — Ambulatory Visit (INDEPENDENT_AMBULATORY_CARE_PROVIDER_SITE_OTHER): Payer: Medicare HMO | Admitting: Internal Medicine

## 2018-02-15 ENCOUNTER — Encounter: Payer: Self-pay | Admitting: Internal Medicine

## 2018-02-15 VITALS — BP 110/82 | HR 99 | Temp 98.2°F | Ht 65.0 in | Wt 209.0 lb

## 2018-02-15 DIAGNOSIS — Z0000 Encounter for general adult medical examination without abnormal findings: Secondary | ICD-10-CM | POA: Diagnosis not present

## 2018-02-15 DIAGNOSIS — E1149 Type 2 diabetes mellitus with other diabetic neurological complication: Secondary | ICD-10-CM | POA: Diagnosis not present

## 2018-02-15 DIAGNOSIS — Z1211 Encounter for screening for malignant neoplasm of colon: Secondary | ICD-10-CM

## 2018-02-15 DIAGNOSIS — Z7189 Other specified counseling: Secondary | ICD-10-CM | POA: Diagnosis not present

## 2018-02-15 DIAGNOSIS — Z1239 Encounter for other screening for malignant neoplasm of breast: Secondary | ICD-10-CM

## 2018-02-15 DIAGNOSIS — F331 Major depressive disorder, recurrent, moderate: Secondary | ICD-10-CM

## 2018-02-15 DIAGNOSIS — E785 Hyperlipidemia, unspecified: Secondary | ICD-10-CM | POA: Diagnosis not present

## 2018-02-15 DIAGNOSIS — IMO0002 Reserved for concepts with insufficient information to code with codable children: Secondary | ICD-10-CM

## 2018-02-15 DIAGNOSIS — E1165 Type 2 diabetes mellitus with hyperglycemia: Secondary | ICD-10-CM

## 2018-02-15 LAB — LIPID PANEL
Cholesterol: 249 mg/dL — ABNORMAL HIGH (ref 0–200)
HDL: 33.9 mg/dL — ABNORMAL LOW (ref >39.00)
NonHDL: 215.21
Total CHOL/HDL Ratio: 7
Triglycerides: 324 mg/dL — ABNORMAL HIGH (ref 0.0–149.0)
VLDL: 64.8 mg/dL — ABNORMAL HIGH (ref 0.0–40.0)

## 2018-02-15 LAB — COMPREHENSIVE METABOLIC PANEL
ALK PHOS: 85 U/L (ref 39–117)
ALT: 19 U/L (ref 0–35)
AST: 28 U/L (ref 0–37)
Albumin: 4.4 g/dL (ref 3.5–5.2)
BUN: 9 mg/dL (ref 6–23)
CO2: 25 mEq/L (ref 19–32)
Calcium: 9.3 mg/dL (ref 8.4–10.5)
Chloride: 100 mEq/L (ref 96–112)
Creatinine, Ser: 1.13 mg/dL (ref 0.40–1.20)
GFR: 51.09 mL/min — ABNORMAL LOW (ref >60.00)
Glucose, Bld: 238 mg/dL — ABNORMAL HIGH (ref 70–99)
Potassium: 3.7 mEq/L (ref 3.5–5.1)
Sodium: 139 mEq/L (ref 135–145)
Total Bilirubin: 1.1 mg/dL (ref 0.2–1.2)
Total Protein: 7.1 g/dL (ref 6.0–8.3)

## 2018-02-15 LAB — CBC
HCT: 43.1 % (ref 36.0–46.0)
HEMOGLOBIN: 14.2 g/dL (ref 12.0–15.0)
MCHC: 32.9 g/dL (ref 30.0–36.0)
MCV: 93 fl (ref 78.0–100.0)
Platelets: 305 10*3/uL (ref 150.0–400.0)
RBC: 4.63 Mil/uL (ref 3.87–5.11)
RDW: 15.2 % (ref 11.5–15.5)
WBC: 10.4 10*3/uL (ref 4.0–10.5)

## 2018-02-15 LAB — T4, FREE: Free T4: 0.76 ng/dL (ref 0.60–1.60)

## 2018-02-15 LAB — LDL CHOLESTEROL, DIRECT: Direct LDL: 173 mg/dL

## 2018-02-15 MED ORDER — ATORVASTATIN CALCIUM 10 MG PO TABS: 10.0000 mg | ORAL_TABLET | Freq: Every day | ORAL | 3 refills | Status: DC

## 2018-02-15 NOTE — Assessment & Plan Note (Signed)
Will start statin

## 2018-02-15 NOTE — Progress Notes (Signed)
Subjective:    Patient ID: Jacqueline Cole, female    DOB: 18-Feb-1951, 67 y.o.   MRN: [id number]  HPI Here for Medicare wellness visit and follow up of chronic health conditions Reviewed form and advanced directives Reviewed other doctors No alcohol or tobacco No exercise---discussed [place] once--"my body shut down for a second". Hand injury Chronic depression Vision is okay---early cataracts Hearing is okay She and husband handle the instrumental ADLs Mild memory issues--is forgetful  "I don't feel bad, but I don't feel good" Chronic depression but not as bad as in past Anxiety spells---trouble getting herself to go out----panic attacks  Sugars are better Proud of A1c down to 6.7% Foot numbness is better--no sig pain  No chest pain Some thoracic back pain Easy DOE--discussed fitness Some dizziness upon arising--better quickly No edema  Current Outpatient Medications on File Prior to Visit  Medication Sig Dispense Refill  . ACCU-CHEK SOFTCLIX LANCETS lancets Use 1 lancet daily. Dx Code E11.49 100 each 3  . FLUoxetine (PROZAC) 20 MG capsule Take 1 capsule (20 mg total) by mouth daily. 90 capsule 3  . glucose blood (ACCU-CHEK AVIVA PLUS) test strip Use to check blood sugar once a day. Dx code E11.49, E11.65 100 each 12  . ibuprofen (ADVIL,MOTRIN) 200 MG tablet Take 200 mg by mouth every 6 (six) hours as needed.    . Insulin Syringe-Needle U-100 25G X 5/8" 1 ML MISC by Does not apply route. Use daily as directed (may substitute for insurance)     . LANTUS 100 UNIT/ML injection Inject 15 units in am and 25 units at bedtime  Under skin. Dx: E11.41 3 vial 3  . liraglutide (VICTOZA) 18 MG/3ML SOPN Inject under skin 1.2 mg daily in am 9 mL 3  . metFORMIN (GLUCOPHAGE) 1000 MG tablet TAKE 1 TABLET BY MOUTH 2 TIMES DAILY. 180 tablet 3  . naproxen sodium (ALEVE) 220 MG tablet Take 220 mg by mouth.    . venlafaxine XR (EFFEXOR-XR) 150 MG 24 hr capsule Take 1 capsule (150 mg total) by  mouth 2 (two) times daily. 180 capsule 3   No current facility-administered medications on file prior to visit.     Allergies  Allergen Reactions  . Onglyza [Saxagliptin] Hives and Nausea And Vomiting  . Liraglutide Nausea Only    Can't eat, nausea, higher sugars    Past Medical History:  Diagnosis Date  . Depression   . Diabetes mellitus type II   . Hyperlipemia   . Major depressive disorder, recurrent, in remission ([place]) 09/01/2006   Qualifier: Diagnosis of  By: Lelon Mast    . Obesity   . Osteopenia   . Syncope 1998   DM diagnosis    Past Surgical History:  Procedure Laterality Date  . ABDOMINAL HYSTERECTOMY  ~2005   and BSO  . CHOLECYSTECTOMY  1975  . COSMETIC SURGERY     Injury Right face as a child  . VAGINAL DELIVERY     X 2    Family History  Problem Relation Age of Onset  . Stroke Father        CVA  . Heart disease Father        MI  . Cancer Sister        breast cancer  . Breast cancer Sister 35  . Heart disease Brother        MI  . Cancer Brother        Lung    Social History  Socioeconomic History  . Marital status: Married    Spouse name: Not on file  . Number of children: 2  . Years of education: Not on file  . Highest education level: Not on file  Occupational History    Comment: "Just up and quit" at [employer]  . Financial resource strain: Not on file  . Food insecurity:    Worry: Not on file    Inability: Not on file  . Transportation needs:    Medical: Not on file    Non-medical: Not on file  Tobacco Use  . Smoking status: Former Smoker    Last attempt to quit: 03/15/1993    Years since quitting: 24.9  . Smokeless tobacco: Never Used  Substance and Sexual Activity  . Alcohol use: No    Alcohol/week: 0.0 standard drinks  . Drug use: No  . Sexual activity: Not on file  Lifestyle  . Physical activity:    Days per week: Not on file    Minutes per session: Not on file  . Stress: Not on file  Relationships    . Social connections:    Talks on phone: Not on file    Gets together: Not on file    Attends religious service: Not on file    Active member of club or organization: Not on file    Attends meetings of clubs or organizations: Not on file    Relationship status: Not on file  . Intimate partner violence:    Fear of current or ex partner: Not on file    Emotionally abused: Not on file    Physically abused: Not on file    Forced sexual activity: Not on file  Other Topics Concern  . Not on file  Social History Narrative   Has living will   Husband, then daughter [name], have health care POA   Would reluctantly accept resuscitation but no life prolonging measures   Review of Systems Appetite is fine Weight is stable Usually sleeps okay Wears seat belt Full dentures--no problems No current skin issues--doesn't see derm No heartburn or dysphagia No sig arthritis pain (other than upper back)    Objective:   Physical Exam  Constitutional: She is oriented to person, place, and time. She appears well-developed. No distress.  HENT:  Mouth/Throat: Oropharynx is clear and moist. No oropharyngeal exudate.  Neck: No thyromegaly present.  Cardiovascular: Normal rate, regular rhythm, normal heart sounds and intact distal pulses. Exam reveals no gallop.  No murmur heard. Respiratory: Effort normal and breath sounds normal. No respiratory distress. She has no wheezes. She has no rales.  GI: Soft. There is no tenderness.  Musculoskeletal: She exhibits no edema or tenderness.  Lymphadenopathy:    She has no cervical adenopathy.  Neurological: She is alert and oriented to person, place, and time.  President-- "Dwaine Deter, son and father..." [phone] something [id number] Recall 3/3  Decreased sensation in feet  Skin: No rash noted.  No foot lesions  Psychiatric: She has a normal mood and affect. Her behavior is normal.           Assessment & Plan:

## 2018-02-15 NOTE — Patient Instructions (Signed)
Ask your pharmacist about the shingrix vaccine when you get your flu shot.

## 2018-02-15 NOTE — Assessment & Plan Note (Signed)
Fair control of depression but now more panic Will set up with counselor

## 2018-02-15 NOTE — Assessment & Plan Note (Signed)
See social history 

## 2018-02-15 NOTE — Assessment & Plan Note (Signed)
I have personally reviewed the Medicare Annual Wellness questionnaire and have noted 1. The patient's medical and social history 2. Their use of alcohol, tobacco or illicit drugs 3. Their current medications and supplements 4. The patient's functional ability including ADL's, fall risks, home safety risks and hearing or visual             impairment. 5. Diet and physical activities 6. Evidence for depression or mood disorders  The patients weight, height, BMI and visual acuity have been recorded in the chart I have made referrals, counseling and provided education to the patient based review of the above and I have provided the pt with a written personalized care plan for preventive services.  I have provided you with a copy of your personalized plan for preventive services. Please take the time to review along with your updated medication list.  No pap due to hyster Due for mammogram FIT Wants to wait on the flu vaccine Discussed shingrix

## 2018-02-15 NOTE — Assessment & Plan Note (Signed)
Control is now very good! Neuropathy better--no meds Start statin

## 2018-02-15 NOTE — Progress Notes (Signed)
Hearing Screening   Method: Audiometry   125Hz  250Hz  500Hz  1000Hz  2000Hz  3000Hz  4000Hz  6000Hz  8000Hz   Right ear:   20 20 0  0    Left ear:   20 20 25   0    Vision Screening Comments: February 2019

## 2018-05-25 ENCOUNTER — Telehealth: Payer: Self-pay | Admitting: Internal Medicine

## 2018-05-25 MED ORDER — GLUCOSE BLOOD VI STRP: ORAL_STRIP | 12 refills | Status: DC

## 2018-05-25 MED ORDER — ONETOUCH ULTRASOFT LANCETS MISC: 12 refills | Status: DC

## 2018-05-25 MED ORDER — ONETOUCH VERIO FLEX SYSTEM W/DEVICE KIT: PACK | 0 refills | Status: AC

## 2018-05-25 NOTE — Telephone Encounter (Signed)
Rx's sent for Onetouch.

## 2018-05-25 NOTE — Telephone Encounter (Signed)
Patient's spouse Lanny Hurst called re: patient needs a new meter due to new insurance. [employer] covers Freestyle, Precision and One Touch and all supplies that go with new meter (e.g. strips). Pharmacy is [facility] on [address] in [place]

## 2018-06-06 ENCOUNTER — Other Ambulatory Visit: Payer: Self-pay

## 2018-06-07 ENCOUNTER — Encounter: Payer: Self-pay | Admitting: Internal Medicine

## 2018-06-07 ENCOUNTER — Other Ambulatory Visit: Payer: Self-pay

## 2018-06-07 ENCOUNTER — Ambulatory Visit (INDEPENDENT_AMBULATORY_CARE_PROVIDER_SITE_OTHER): Payer: PPO | Admitting: Internal Medicine

## 2018-06-07 VITALS — BP 120/70 | HR 90 | Temp 98.4°F | Ht 65.0 in | Wt 198.0 lb

## 2018-06-07 DIAGNOSIS — E669 Obesity, unspecified: Secondary | ICD-10-CM | POA: Diagnosis not present

## 2018-06-07 DIAGNOSIS — E1165 Type 2 diabetes mellitus with hyperglycemia: Secondary | ICD-10-CM

## 2018-06-07 DIAGNOSIS — E785 Hyperlipidemia, unspecified: Secondary | ICD-10-CM | POA: Diagnosis not present

## 2018-06-07 DIAGNOSIS — E1149 Type 2 diabetes mellitus with other diabetic neurological complication: Secondary | ICD-10-CM | POA: Diagnosis not present

## 2018-06-07 DIAGNOSIS — IMO0002 Reserved for concepts with insufficient information to code with codable children: Secondary | ICD-10-CM

## 2018-06-07 LAB — POCT GLYCOSYLATED HEMOGLOBIN (HGB A1C): Hemoglobin A1C: 6.9 % — AB (ref 4.0–5.6)

## 2018-06-07 MED ORDER — LIRAGLUTIDE 18 MG/3ML ~~LOC~~ SOPN: PEN_INJECTOR | SUBCUTANEOUS | 3 refills | Status: DC

## 2018-06-07 MED ORDER — LANTUS 100 UNIT/ML ~~LOC~~ SOLN: SUBCUTANEOUS | 3 refills | Status: DC

## 2018-06-07 MED ORDER — ROSUVASTATIN CALCIUM 10 MG PO TABS: 10.0000 mg | ORAL_TABLET | Freq: Every day | ORAL | 11 refills | Status: DC

## 2018-06-07 MED ORDER — METFORMIN HCL 1000 MG PO TABS: 1000.0000 mg | ORAL_TABLET | Freq: Two times a day (BID) | ORAL | 3 refills | Status: DC

## 2018-06-07 NOTE — Addendum Note (Signed)
Addended by: Cardell Peach I on: 06/07/2018 12:45 PM   Modules accepted: Orders

## 2018-06-07 NOTE — Progress Notes (Signed)
Patient ID: Jacqueline Cole, female   DOB: 13-Jun-1950, 68 y.o.   MRN: [id number]   HPI: Jacqueline Cole is a 68 y.o.-year-old female, returning for follow-up for DM2, dx in 1997, insulin-dependent since 2000, uncontrolled, with complications (diabetic retinopathy, mild CKD, peripheral neuropathy).  Last visit 4 months ago.  Last hemoglobin A1c was: Lab Results  Component Value Date   HGBA1C 6.7 (A) 02/01/2018   HGBA1C 7.5 (A) 11/01/2017   HGBA1C 8.3 07/28/2017   Pt is on a regimen of: - Metformin 1000 mg 2x a day, with meals - Lantus 60 units 2x a day >>  40 units 2x a day >> 30 units 2x a day >> 15 units in a.m. and 15 units at night - Victoza 0.6 mg daily - started 07/2017 >> increased to 0.6 mg daily + 3 clicks >> 1.2 mg daily She was previously on Onglyza-developed facial rash on left side of face.  Pt checks her sugars 1-2 times a day: - am:  108-131, 159 >> 64, 83-145 >> 87-128, 153, 182 - 2h after b'fast: 413 >> n/c >> 73-147, 168 - before lunch: 105, 184, 196 >> 79-127, 140 >> 87-106 - 2h after lunch: 377, 420, 500  >> n/c >> 164, 277 - before dinner: 92, 156-176, 220 >> 77, 147 >> n/c - 2h after dinner: 391 >> n/c >> 204 >> n/c - bedtime: n/c - nighttime: n/c Lowest sugar was 54 (on higher insulin dose) >> 92 >> 64 >> 87; she has hypoglycemia awareness in the 80s. Highest sugar was 380 >> 220 (icecream) >> 204 (HA) >> 277.  Glucometer: One Touch ultra >> Accu-Chek  Pt's meals are: - Breakfast: eggs, , sliced tomatoes - Lunch: sandwich, pretzels - Dinner: meat + 2 veggies - Snacks: fruit, almonds  -+ CKD, last BUN/creatinine:  Lab Results  Component Value Date   BUN 9 02/15/2018   BUN 10 12/13/2016   CREATININE 1.13 02/15/2018   CREATININE 1.18 12/13/2016   -+ HL; last set of lipids: Lab Results  Component Value Date   CHOL 249 (H) 02/15/2018   HDL 33.90 (L) 02/15/2018   LDLCALC 76 09/25/2013   LDLDIRECT 173.0 02/15/2018   TRIG 324.0 (H) 02/15/2018   CHOLHDL  7 02/15/2018  Started atorvastatin 10 >> started feeling nauseated >> stopped. - last eye exam 02/2018: + DR; + history of surgery and IO injections, but she had headaches from these.  She sees Dr. Rodolph Cole. - she has numbness and tingling in her feet.   Pt has no FH of DM.  She has a history of hysterectomy in 2001.  She was on LT4 as a teenager >> stopped a long time ago as her TFTs normalized.  At last visit, we recheck her TFTs and these were normal.  ROS: Constitutional: no weight gain/+ weight loss, no fatigue, no subjective hyperthermia, no subjective hypothermia Eyes: no blurry vision, no xerophthalmia ENT: no sore throat, no nodules palpated in neck, no dysphagia, no odynophagia, no hoarseness Cardiovascular: no CP/no SOB/no palpitations/no leg swelling Respiratory: no cough/no SOB/no wheezing Gastrointestinal: no N/no V/no D/no C/no acid reflux Musculoskeletal: no muscle aches/no joint aches Skin: no rashes, no hair loss Neurological: no tremors/+ numbness/+ tingling/no dizziness  I reviewed pt's medications, allergies, PMH, social hx, family hx, and changes were documented in the history of present illness. Otherwise, unchanged from my initial visit note.  Past Medical History:  Diagnosis Date  . Depression   . Diabetes mellitus type II   .  Hyperlipemia   . Major depressive disorder, recurrent, in remission ([place]) 09/01/2006   Qualifier: Diagnosis of  By: Lelon Mast    . Obesity   . Osteopenia   . Syncope 1998   DM diagnosis   Past Surgical History:  Procedure Laterality Date  . ABDOMINAL HYSTERECTOMY  ~2005   and BSO  . CHOLECYSTECTOMY  1975  . COSMETIC SURGERY     Injury Right face as a child  . VAGINAL DELIVERY     X 2   Social History   Socioeconomic History  . Marital status: Married    Spouse name: Not on file  . Number of children: 2  . Years of education: Not on file  . Highest education level: Not on file  Occupational History    Comment:  "Just up and quit" at [employer]  . Financial resource strain: Not on file  . Food insecurity:    Worry: Not on file    Inability: Not on file  . Transportation needs:    Medical: Not on file    Non-medical: Not on file  Tobacco Use  . Smoking status: Former Smoker    Last attempt to quit: 03/15/1993    Years since quitting: 25.2  . Smokeless tobacco: Never Used  Substance and Sexual Activity  . Alcohol use: No    Alcohol/week: 0.0 standard drinks  . Drug use: No  . Sexual activity: Not on file  Lifestyle  . Physical activity:    Days per week: Not on file    Minutes per session: Not on file  . Stress: Not on file  Relationships  . Social connections:    Talks on phone: Not on file    Gets together: Not on file    Attends religious service: Not on file    Active member of club or organization: Not on file    Attends meetings of clubs or organizations: Not on file    Relationship status: Not on file  . Intimate partner violence:    Fear of current or ex partner: Not on file    Emotionally abused: Not on file    Physically abused: Not on file    Forced sexual activity: Not on file  Other Topics Concern  . Not on file  Social History Narrative   Has living will   Husband, then daughter Jacqueline Cole, have health care POA   Would reluctantly accept resuscitation but no life prolonging measures   Current Outpatient Medications on File Prior to Visit  Medication Sig Dispense Refill  . atorvastatin (LIPITOR) 10 MG tablet Take 1 tablet (10 mg total) by mouth daily. 90 tablet 3  . Blood Glucose Monitoring Suppl ([place]) w/Device KIT Use to check blood sugar 2 times a day. 1 kit 0  . FLUoxetine (PROZAC) 20 MG capsule Take 1 capsule (20 mg total) by mouth daily. 90 capsule 3  . glucose blood (ONETOUCH VERIO) test strip Use as instructed to check blood sugar 2 times a day. 200 each 12  . ibuprofen (ADVIL,MOTRIN) 200 MG tablet Take 200 mg by mouth every 6  (six) hours as needed.    . Insulin Syringe-Needle U-100 25G X 5/8" 1 ML MISC by Does not apply route. Use daily as directed (may substitute for insurance)     . Lancets (ONETOUCH ULTRASOFT) lancets Use as instructed to check blood sugar 2 times a day. 200 each 12  . LANTUS 100 UNIT/ML injection Inject  15 units in am and 25 units at bedtime  Under skin. Dx: E11.41 3 vial 3  . liraglutide (VICTOZA) 18 MG/3ML SOPN Inject under skin 1.2 mg daily in am 9 mL 3  . metFORMIN (GLUCOPHAGE) 1000 MG tablet TAKE 1 TABLET BY MOUTH 2 TIMES DAILY. 180 tablet 3  . naproxen sodium (ALEVE) 220 MG tablet Take 220 mg by mouth.    . venlafaxine XR (EFFEXOR-XR) 150 MG 24 hr capsule Take 1 capsule (150 mg total) by mouth 2 (two) times daily. 180 capsule 3   No current facility-administered medications on file prior to visit.    Allergies  Allergen Reactions  . Onglyza [Saxagliptin] Hives and Nausea And Vomiting  . Liraglutide Nausea Only    Can't eat, nausea, higher sugars   Family History  Problem Relation Age of Onset  . Stroke Father        CVA  . Heart disease Father        MI  . Cancer Sister        breast cancer  . Breast cancer Sister 36  . Heart disease Brother        MI  . Cancer Brother        Lung    PE: BP 120/70   Pulse 90   Temp 98.4 F (36.9 C)   Ht '5\' 5"'$  (1.651 m)   Wt 198 lb (89.8 kg)   SpO2 98%   BMI 32.95 kg/m  Wt Readings from Last 3 Encounters:  06/07/18 198 lb (89.8 kg)  02/15/18 209 lb (94.8 kg)  02/01/18 209 lb (94.8 kg)   Constitutional: overweight, in NAD Eyes: PERRLA, EOMI, no exophthalmos ENT: moist mucous membranes, no thyromegaly, no cervical lymphadenopathy Cardiovascular: RRR, No MRG Respiratory: CTA B Gastrointestinal: abdomen soft, NT, ND, BS+ Musculoskeletal: no deformities, strength intact in all 4 Skin: moist, warm, no rashes Neurological: no tremor with outstretched hands, DTR normal in all 4  ASSESSMENT: 1. DM2, insulin-dependent,  uncontrolled, with complications - DR - PN - mild CKD  2. HL  3. Obesity class II BMI Classification:  < 18.5 underweight   18.5-24.9 normal weight   25.0-29.9 overweight   30.0-34.9 class I obesity   35.0-39.9 class II obesity   ? 40.0 class III obesity   PLAN:  1. Patient with longstanding, uncontrolled, type 2 diabetes, on oral antidiabetic regimen and daily GLP-1 receptor agonist.  Sugars improved significantly after starting Victoza even on the lower dose and she also lost a significant amount of weight.  She also started to change her diet.  At last visit, sugars were even better but she had occasional hyperglycemic spikes and we discussed about increasing Victoza to 1.2 mg daily.  We also reduced the Lantus dose. -At this visit, her sugars are slightly more fluctuating than before with few spikes in the hypoglycemic range.  We decided to move towards a daily (rather than 2x a day) Lantus dose and reduce the dose to 25 units at bedtime.  We will continue metformin and Victoza at the current doses.  I refilled all of her diabetes medications. - I suggested to:  Patient Instructions  Please continue: - Metformin 1000 mg 2x a day - Victoza 1.2 mg daily in am  Change: - Lantus to 25 units at bedtime  Please return in 4 months with your sugar log.   - today, HbA1c is 6.9% (higher) - continue checking sugars at different times of the day - check 1-2x a day,  rotating checks - advised for yearly eye exams >> she is UTD - Return to clinic in 4 mo with sugar log    2. HL - Reviewed latest lipid panel: LDL very high, higher than before, triglycerides also high, and HDL low Lab Results  Component Value Date   CHOL 249 (H) 02/15/2018   HDL 33.90 (L) 02/15/2018   LDLCALC 76 09/25/2013   LDLDIRECT 173.0 02/15/2018   TRIG 324.0 (H) 02/15/2018   CHOLHDL 7 02/15/2018  - started atorvastatin by PCP >> nausea >> stopped -I suggested to switch to rosuvastatin 10.  She agrees.   Send to her pharmacy.  3. Obesity class II -She initially lost 12 pounds after starting Victoza, even at the lowest dose.  Before last visit, she lost 13 more pounds.  She also changed her diet. - since last visit, lost 11 lbs more!  Philemon Kingdom, MD PhD [facility] Endocrinology

## 2018-06-07 NOTE — Patient Instructions (Addendum)
Please continue: - Metformin 1000 mg 2x a day - Victoza 1.2 mg daily in am  Change: - Lantus to 25 units at bedtime  Start: - Rosuvastatin 10 Mg at bedtime  Please return in 4 months with your sugar log.

## 2018-08-12 ENCOUNTER — Emergency Department (HOSPITAL_COMMUNITY)
Admission: EM | Admit: 2018-08-12 | Discharge: 2018-08-12 | Disposition: A | Discharge status: Home / Self Care | Payer: PPO | Attending: Emergency Medicine | Admitting: Emergency Medicine

## 2018-08-12 ENCOUNTER — Encounter (HOSPITAL_COMMUNITY): Payer: Self-pay | Admitting: Pharmacy Technician

## 2018-08-12 ENCOUNTER — Other Ambulatory Visit: Payer: Self-pay

## 2018-08-12 DIAGNOSIS — Z794 Long term (current) use of insulin: Secondary | ICD-10-CM | POA: Diagnosis not present

## 2018-08-12 DIAGNOSIS — E119 Type 2 diabetes mellitus without complications: Secondary | ICD-10-CM | POA: Insufficient documentation

## 2018-08-12 DIAGNOSIS — B029 Zoster without complications: Secondary | ICD-10-CM | POA: Diagnosis not present

## 2018-08-12 DIAGNOSIS — L03031 Cellulitis of right toe: Secondary | ICD-10-CM | POA: Insufficient documentation

## 2018-08-12 DIAGNOSIS — Z79899 Other long term (current) drug therapy: Secondary | ICD-10-CM | POA: Diagnosis not present

## 2018-08-12 DIAGNOSIS — Z87891 Personal history of nicotine dependence: Secondary | ICD-10-CM | POA: Diagnosis not present

## 2018-08-12 DIAGNOSIS — R5383 Other fatigue: Secondary | ICD-10-CM | POA: Diagnosis not present

## 2018-08-12 DIAGNOSIS — R21 Rash and other nonspecific skin eruption: Secondary | ICD-10-CM | POA: Diagnosis present

## 2018-08-12 MED ORDER — CEPHALEXIN 500 MG PO CAPS: 1000.0000 mg | ORAL_CAPSULE | Freq: Two times a day (BID) | ORAL | 0 refills | Status: DC

## 2018-08-12 MED ORDER — ACYCLOVIR 400 MG PO TABS: 400.0000 mg | ORAL_TABLET | Freq: Four times a day (QID) | ORAL | 0 refills | Status: DC

## 2018-08-12 MED ORDER — HYDROCODONE-ACETAMINOPHEN 5-325 MG PO TABS: 1.0000 | ORAL_TABLET | Freq: Four times a day (QID) | ORAL | 0 refills | Status: DC | PRN

## 2018-08-12 NOTE — ED Triage Notes (Signed)
Pt arrives intermittent fevers, headache, SOB and rash to R shoulder blade onset Wednesday. Pt speaking in complete sentences in NAD.

## 2018-08-12 NOTE — ED Notes (Signed)
Patient Alert and oriented to baseline. Stable and ambulatory to baseline. Patient verbalized understanding of the discharge instructions.  Patient belongings were taken by the patient.   

## 2018-08-12 NOTE — ED Provider Notes (Signed)
[place] EMERGENCY DEPARTMENT Provider Note   CSN: [id number] Arrival date & time: 08/12/18  1305    History   Chief Complaint Chief Complaint  Patient presents with  . Shortness of Breath  . Rash  . Headache    HPI Jacqueline Cole is a 68 y.o. female.     HPI Reports that she developed a painful area on her right back on Wednesday.  She did not really know what the problem was till she got her husband to take a picture of it yesterday.  She reports she is felt a little achy and not quite herself.  Some fatigue.  No documented fever subjectively is felt like she has been kind of hot.  Patient is worried about coronavirus.  She is also managing a red area on her great toe.  He is doing soaks and applying antibiotic ointment.  She reports she has something similar on the left toe that resolved by that management.  No vomiting.  She reports she chronically has some diarrhea after starting Victoza. Past Medical History:  Diagnosis Date  . Depression   . Diabetes mellitus type II   . Hyperlipemia   . Major depressive disorder, recurrent, in remission ([place]) 09/01/2006   Qualifier: Diagnosis of  By: Lelon Mast    . Obesity   . Osteopenia   . Syncope 1998   DM diagnosis    Patient Active Problem List   Diagnosis Date Noted  . MDD (major depressive disorder), recurrent episode, moderate ([place]) 08/09/2017  . Osteopenia   . Osteoarthritis of right knee 12/13/2016  . Advance directive discussed with patient 12/13/2016  . Type 2 diabetes mellitus with neurological manifestations, uncontrolled ([place]) 02/15/2012  . Routine general medical examination at a health care facility 05/03/2011  . Class II obesity 05/03/2011  . Hyperlipemia 09/01/2006    Past Surgical History:  Procedure Laterality Date  . ABDOMINAL HYSTERECTOMY  ~2005   and BSO  . CHOLECYSTECTOMY  1975  . COSMETIC SURGERY     Injury Right face as a child  . VAGINAL DELIVERY     X 2     OB  History   No obstetric history on file.      Home Medications    Prior to Admission medications   Medication Sig Start Date End Date Taking? Authorizing Provider  acyclovir (ZOVIRAX) 400 MG tablet Take 1 tablet (400 mg total) by mouth 4 (four) times daily. 08/12/18   Charlesetta Shanks, MD  Blood Glucose Monitoring Suppl ([place]) w/Device KIT Use to check blood sugar 2 times a day. 05/25/18   Philemon Kingdom, MD  cephALEXin (KEFLEX) 500 MG capsule Take 2 capsules (1,000 mg total) by mouth 2 (two) times daily. 08/12/18   Charlesetta Shanks, MD  FLUoxetine (PROZAC) 20 MG capsule Take 1 capsule (20 mg total) by mouth daily. 04/26/14   [name] I, MD  glucose blood (ONETOUCH VERIO) test strip Use as instructed to check blood sugar 2 times a day. 05/25/18   Philemon Kingdom, MD  HYDROcodone-acetaminophen (NORCO/VICODIN) 5-325 MG tablet Take 1-2 tablets by mouth every 6 (six) hours as needed for moderate pain or severe pain. 08/12/18   Charlesetta Shanks, MD  ibuprofen (ADVIL,MOTRIN) 200 MG tablet Take 200 mg by mouth every 6 (six) hours as needed.    [provider]  Insulin Syringe-Needle U-100 25G X 5/8" 1 ML MISC by Does not apply route. Use daily as directed (may substitute for insurance)  [provider]  Lancets [facility] ULTRASOFT) lancets Use as instructed to check blood sugar 2 times a day. 05/25/18   Philemon Kingdom, MD  LANTUS 100 UNIT/ML injection Inject 25 units at bedtime - under skin. Dx: E11.41 06/07/18   Philemon Kingdom, MD  liraglutide (VICTOZA) 18 MG/3ML SOPN Inject under skin 1.2 mg daily in am 06/07/18   Philemon Kingdom, MD  metFORMIN (GLUCOPHAGE) 1000 MG tablet Take 1 tablet (1,000 mg total) by mouth 2 (two) times daily. 06/07/18   Philemon Kingdom, MD  naproxen sodium (ALEVE) 220 MG tablet Take 220 mg by mouth.    [provider]  rosuvastatin (CRESTOR) 10 MG tablet Take 1 tablet (10 mg total) by mouth daily. 06/07/18   Philemon Kingdom, MD  venlafaxine XR (EFFEXOR-XR) 150 MG 24 hr capsule Take 1 capsule (150 mg total) by mouth 2 (two) times daily. 11/02/17   Venia Carbon, MD    Family History Family History  Problem Relation Age of Onset  . Stroke Father        CVA  . Heart disease Father        MI  . Cancer Sister        breast cancer  . Breast cancer Sister 85  . Heart disease Brother        MI  . Cancer Brother        Lung    Social History Social History   Tobacco Use  . Smoking status: Former Smoker    Last attempt to quit: 03/15/1993    Years since quitting: 25.4  . Smokeless tobacco: Never Used  Substance Use Topics  . Alcohol use: No    Alcohol/week: 0.0 standard drinks  . Drug use: No     Allergies   Onglyza [saxagliptin] and Liraglutide   Review of Systems Review of Systems 10 Systems reviewed and are negative for acute change except as noted in the HPI.  Physical Exam Updated Vital Signs BP (!) 143/65   Pulse 84   Temp 98 F (36.7 C) (Oral)   Resp (!) 21   SpO2 96%   Physical Exam Constitutional:      Appearance: Normal appearance. She is well-developed.     Comments: Patient is alert and nontoxic.  She is clinically well in appearance.  Pleasant and interactive.  HENT:     Head: Normocephalic and atraumatic.  Eyes:     Pupils: Pupils are equal, round, and reactive to light.  Neck:     Musculoskeletal: Neck supple.  Cardiovascular:     Rate and Rhythm: Normal rate and regular rhythm.     Heart sounds: Normal heart sounds.  Pulmonary:     Effort: Pulmonary effort is normal.     Breath sounds: Normal breath sounds.  Abdominal:     General: Bowel sounds are normal. There is no distension.     Palpations: Abdomen is soft.     Tenderness: There is no abdominal tenderness.  Musculoskeletal: Normal range of motion.  Skin:    General: Skin is warm and dry.     Comments: Zoster rash on the right shoulder.  See attached images  Neurological:     Mental Status:  She is alert and oriented to person, place, and time.     GCS: GCS eye subscore is 4. GCS verbal subscore is 5. GCS motor subscore is 6.     Coordination: Coordination normal.     Comments: Patient is alert and well in appearance.  Her mental status is clear.  Speech is clear and normal.  Movements are coordinated purposeful symmetric without difficulty.            ED Treatments / Results  Labs (all labs ordered are listed, but only abnormal results are displayed) Labs Reviewed - No data to display  EKG None  Radiology No results found.  Procedures Procedures (including critical care time)  Medications Ordered in ED Medications - No data to display   Initial Impression / Assessment and Plan / ED Course  I have reviewed the triage vital signs and the nursing notes.  Pertinent labs & imaging results that were available during my care of the patient were reviewed by me and considered in my medical decision making (see chart for details).       Presents with concern for coronavirus because she has developed a rash.  She has rash consistent with zoster.  It started at least 5 days ago.  Otherwise the patient is clinically well in appearance.  She has no distress.  Has a minor paronychia that she is managing with soaks and antibiotic ointment.  There is no appearance of needing to be drained or incised at this time.  Because the patient is diabetic I will place her on Keflex and have her continue soaks and topical antibiotics with close follow-up with her podiatrist or PCP.  Final Clinical Impressions(s) / ED Diagnoses   Final diagnoses:  Herpes zoster without complication  Paronychia of great toe, right    ED Discharge Orders         Ordered    cephALEXin (KEFLEX) 500 MG capsule  2 times daily     08/12/18 1513    acyclovir (ZOVIRAX) 400 MG tablet  4 times daily     08/12/18 1513    HYDROcodone-acetaminophen (NORCO/VICODIN) 5-325 MG tablet  Every 6 hours PRN      08/12/18 1514           Charlesetta Shanks, MD 08/12/18 1523

## 2018-08-14 MED ORDER — VALACYCLOVIR HCL 1 G PO TABS: 1000.0000 mg | ORAL_TABLET | Freq: Three times a day (TID) | ORAL | 0 refills | Status: DC

## 2018-08-18 ENCOUNTER — Ambulatory Visit: Payer: Medicare HMO | Admitting: Internal Medicine

## 2018-10-05 ENCOUNTER — Ambulatory Visit: Payer: PPO | Admitting: Internal Medicine

## 2018-10-05 ENCOUNTER — Ambulatory Visit: Payer: PPO | Admitting: Endocrinology

## 2018-10-05 ENCOUNTER — Other Ambulatory Visit: Payer: Self-pay

## 2018-10-05 ENCOUNTER — Encounter: Payer: Self-pay | Admitting: Internal Medicine

## 2018-10-05 VITALS — BP 120/82 | HR 82 | Ht 65.0 in | Wt 201.0 lb

## 2018-10-05 DIAGNOSIS — IMO0002 Reserved for concepts with insufficient information to code with codable children: Secondary | ICD-10-CM

## 2018-10-05 DIAGNOSIS — E1165 Type 2 diabetes mellitus with hyperglycemia: Secondary | ICD-10-CM

## 2018-10-05 DIAGNOSIS — E785 Hyperlipidemia, unspecified: Secondary | ICD-10-CM

## 2018-10-05 DIAGNOSIS — E669 Obesity, unspecified: Secondary | ICD-10-CM

## 2018-10-05 DIAGNOSIS — E1149 Type 2 diabetes mellitus with other diabetic neurological complication: Secondary | ICD-10-CM | POA: Diagnosis not present

## 2018-10-05 LAB — POCT GLYCOSYLATED HEMOGLOBIN (HGB A1C): Hemoglobin A1C: 6.9 % — AB (ref 4.0–5.6)

## 2018-10-05 NOTE — Progress Notes (Signed)
Patient ID: [id number] Cole, female   DOB: May 28, 1950, 68 y.o.   MRN: [id number]   HPI: Jacqueline Cole is a 68 y.o.-year-old female, returning for follow-up for DM2, dx in 1997, insulin-dependent since 2000, uncontrolled, with complications (diabetic retinopathy, mild CKD, peripheral neuropathy).  Last visit 4 months ago.  She recently had shingles on upper back >> was on steroids for 1 week >> sugars higher.  During that period,  She did not eat much so she decreased the dose of Lantus and Victoza.  Now back on the previous doses.  Reviewed HbA1c levels: Lab Results  Component Value Date   HGBA1C 6.9 (A) 06/07/2018   HGBA1C 6.7 (A) 02/01/2018   HGBA1C 7.5 (A) 11/01/2017   Pt is on a regimen of: - Metformin 1000 mg 2x a day, with meals - Lantus 60 units 2x a day >>  40 units 2x a day >> 30 units 2x a day >> 15 units in a.m. and 15 units at night >> 25 units at night  - Victoza 0.6 mg daily - started 07/2017 >> increased to 0.6 mg daily + 3 clicks >> 1.2 mg daily She was previously on Onglyza-developed facial rash on left side of face.  Pt checks her sugars 1-2 times a day: - am:  64, 83-145 >> 87-128, 153, 182 >> 83-148, 181 - 2h after b'fast: 413 >> n/c >> 73-147, 168 >> n/c - before lunch: 105, 184, 196 >> 79-127, 140 >> 87-106 >> n/c - 2h after lunch: 377, 420, 500  >> n/c >> 164, 277 >> n/c - before dinner: 92, 156-176, 220 >> 77, 147 >> n/c - 2h after dinner: 391 >> n/c >> 204 >> n/c - bedtime: n/c - nighttime: n/c Lowest sugar was 64 >> 87 >> 83; she has hypoglycemia awareness in the 80s. Highest sugar was 204 (HA) >> 277 >> 181.  Glucometer: One Touch ultra >> Accu-Chek  Pt's meals are: - Breakfast: eggs, , sliced tomatoes >> toast, Propel water - Lunch: sandwich, pretzels >> half a sandwich + half an apple - Dinner: meat + 2 veggies - Snacks: fruit, almonds  -+ CKD, last BUN/creatinine:  Lab Results  Component Value Date   BUN 9 02/15/2018   BUN 10 12/13/2016    CREATININE 1.13 02/15/2018   CREATININE 1.18 12/13/2016   -+ HL; last set of lipids: Lab Results  Component Value Date   CHOL 249 (H) 02/15/2018   HDL 33.90 (L) 02/15/2018   LDLCALC 76 09/25/2013   LDLDIRECT 173.0 02/15/2018   TRIG 324.0 (H) 02/15/2018   CHOLHDL 7 02/15/2018  Started atorvastatin 10 >> started feeling nauseated >> stopped.  At last visit we started Crestor 10, but she developed nausea with this, also, and she stopped after 3 weeks - last eye exam 02/2018: + DR; + history of surgery and IO injections, but she had headaches from these.  She sees Dr. Rodolph Bong. - + numbness and tingling in her feet.   Pt has no FH of DM.  She has a history of hysterectomy in 2001.  She was on LT4 as a teenager >> stopped a long time ago as her TFTs normalized.  At last visit, we recheck her TFTs and these were normal.  ROS: Constitutional: no weight gain/no weight loss, no fatigue, no subjective hyperthermia, no subjective hypothermia Eyes: no blurry vision, no xerophthalmia ENT: no sore throat, no nodules palpated in neck, no dysphagia, no odynophagia, no hoarseness Cardiovascular: no CP/no SOB/no palpitations/no  leg swelling Respiratory: no cough/no SOB/no wheezing Gastrointestinal: + N/no V/no D/no C/no acid reflux Musculoskeletal: no muscle aches/no joint aches Skin: + Resolved rash from shingles right upper shoulder , no hair loss Neurological: no tremors/+ numbness/+ tingling/no dizziness  I reviewed pt's medications, allergies, PMH, social hx, family hx, and changes were documented in the history of present illness. Otherwise, unchanged from my initial visit note.  Past Medical History:  Diagnosis Date  . Depression   . Diabetes mellitus type II   . Hyperlipemia   . Major depressive disorder, recurrent, in remission ([place]) 09/01/2006   Qualifier: Diagnosis of  By: Lelon Mast    . Obesity   . Osteopenia   . Syncope 1998   DM diagnosis   Past Surgical History:   Procedure Laterality Date  . ABDOMINAL HYSTERECTOMY  ~2005   and BSO  . CHOLECYSTECTOMY  1975  . COSMETIC SURGERY     Injury Right face as a child  . VAGINAL DELIVERY     X 2   Social History   Socioeconomic History  . Marital status: Married    Spouse name: Not on file  . Number of children: 2  . Years of education: Not on file  . Highest education level: Not on file  Occupational History    Comment: "Just up and quit" at [employer]  . Financial resource strain: Not on file  . Food insecurity    Worry: Not on file    Inability: Not on file  . Transportation needs    Medical: Not on file    Non-medical: Not on file  Tobacco Use  . Smoking status: Former Smoker    Quit date: 03/15/1993    Years since quitting: 25.5  . Smokeless tobacco: Never Used  Substance and Sexual Activity  . Alcohol use: No    Alcohol/week: 0.0 standard drinks  . Drug use: No  . Sexual activity: Not on file  Lifestyle  . Physical activity    Days per week: Not on file    Minutes per session: Not on file  . Stress: Not on file  Relationships  . Social Herbalist on phone: Not on file    Gets together: Not on file    Attends religious service: Not on file    Active member of club or organization: Not on file    Attends meetings of clubs or organizations: Not on file    Relationship status: Not on file  . Intimate partner violence    Fear of current or ex partner: Not on file    Emotionally abused: Not on file    Physically abused: Not on file    Forced sexual activity: Not on file  Other Topics Concern  . Not on file  Social History Narrative   Has living will   Husband, then daughter [name], have health care POA   Would reluctantly accept resuscitation but no life prolonging measures   Current Outpatient Medications on File Prior to Visit  Medication Sig Dispense Refill  . acyclovir (ZOVIRAX) 400 MG tablet Take 1 tablet (400 mg total) by mouth 4 (four) times  daily. 50 tablet 0  . Blood Glucose Monitoring Suppl ([place]) w/Device KIT Use to check blood sugar 2 times a day. 1 kit 0  . cephALEXin (KEFLEX) 500 MG capsule Take 2 capsules (1,000 mg total) by mouth 2 (two) times daily. 28 capsule 0  . FLUoxetine (PROZAC)  20 MG capsule Take 1 capsule (20 mg total) by mouth daily. 90 capsule 3  . glucose blood (ONETOUCH VERIO) test strip Use as instructed to check blood sugar 2 times a day. 200 each 12  . HYDROcodone-acetaminophen (NORCO/VICODIN) 5-325 MG tablet Take 1-2 tablets by mouth every 6 (six) hours as needed for moderate pain or severe pain. 20 tablet 0  . ibuprofen (ADVIL,MOTRIN) 200 MG tablet Take 200 mg by mouth every 6 (six) hours as needed.    . Insulin Syringe-Needle U-100 25G X 5/8" 1 ML MISC by Does not apply route. Use daily as directed (may substitute for insurance)     . Lancets (ONETOUCH ULTRASOFT) lancets Use as instructed to check blood sugar 2 times a day. 200 each 12  . LANTUS 100 UNIT/ML injection Inject 25 units at bedtime - under skin. Dx: E11.41 3 vial 3  . liraglutide (VICTOZA) 18 MG/3ML SOPN Inject under skin 1.2 mg daily in am 9 mL 3  . metFORMIN (GLUCOPHAGE) 1000 MG tablet Take 1 tablet (1,000 mg total) by mouth 2 (two) times daily. 180 tablet 3  . naproxen sodium (ALEVE) 220 MG tablet Take 220 mg by mouth.    . rosuvastatin (CRESTOR) 10 MG tablet Take 1 tablet (10 mg total) by mouth daily. 30 tablet 11  . valACYclovir (VALTREX) 1000 MG tablet Take 1 tablet (1,000 mg total) by mouth 3 (three) times daily. 21 tablet 0  . venlafaxine XR (EFFEXOR-XR) 150 MG 24 hr capsule Take 1 capsule (150 mg total) by mouth 2 (two) times daily. 180 capsule 3   No current facility-administered medications on file prior to visit.    Allergies  Allergen Reactions  . Onglyza [Saxagliptin] Hives and Nausea And Vomiting  . Liraglutide Nausea Only    Can't eat, nausea, higher sugars   Family History  Problem Relation Age of  Onset  . Stroke Father        CVA  . Heart disease Father        MI  . Cancer Sister        breast cancer  . Breast cancer Sister 58  . Heart disease Brother        MI  . Cancer Brother        Lung    PE: BP 120/82   Pulse 82   Ht 5' 5" (1.651 m)   Wt 201 lb (91.2 kg)   SpO2 98%   BMI 33.45 kg/m  Wt Readings from Last 3 Encounters:  10/05/18 201 lb (91.2 kg)  06/07/18 198 lb (89.8 kg)  02/15/18 209 lb (94.8 kg)   Constitutional: overweight, in NAD Eyes: PERRLA, EOMI, no exophthalmos ENT: moist mucous membranes, no thyromegaly, no cervical lymphadenopathy Cardiovascular: RRR, No MRG Respiratory: CTA B Gastrointestinal: abdomen soft, NT, ND, BS+ Musculoskeletal: no deformities, strength intact in all 4 Skin: moist, warm, no rashes Neurological: no tremor with outstretched hands, DTR normal in all 4  ASSESSMENT: 1. DM2, insulin-dependent, uncontrolled, with complications - DR - PN - mild CKD  2. HL  3. Obesity class II BMI Classification:  < 18.5 underweight   18.5-24.9 normal weight   25.0-29.9 overweight   30.0-34.9 class I obesity   35.0-39.9 class II obesity   ? 40.0 class III obesity   PLAN:  1. Patient with longstanding, uncontrolled, type 2 diabetes, on oral antidiabetic regimen, daily GLP-1 receptor agonist and long-acting insulin.  Her sugars improved significantly after adding Victoza and we were able to decrease  her Lantus dose.  She also lost a significant amount of weight.  At last visit sugars were slightly more fluctuating and she had several mildly hypoglycemic blood sugars.  We decided to move Lantus and took only 1 dose rather than 2 a day and reduce the dose to 25 units at bedtime. -At this visit sugars are at or close to goal, with few exceptions, however, she is not checking sugars later in the day and I advised her to start doing so, also.  Highest blood sugar is 181 but this was during her shingles episode when she had prednisone.   Also, at that time, she decreased the dose of Victoza and insulin.  However, she is now back on the previous doses and sugars are now back at goal.  We will not change her regimen. - I suggested to:  Patient Instructions  Please continue: - Metformin1000 mg 2x a day - Victoza 1.2 mg daily in am - Lantus 25 units daily at bedtime  Restart Crestor 10 mg once a week. Move it closer togther over the next few weeks as tolerated.  Please return in 4 months with your sugar log.   - we checked her HbA1c: 6.9% (stable) - advised to check sugars at different times of the day - 1x a day, rotating check times - advised for yearly eye exams >> she is UTD - return to clinic in 4 months    2. HL - Reviewed latest lipid panel from 02/2018: LDL was very high, as are her triglycerides.  HDL low.  At that time, I suggested to switch to rosuvastatin 10.  Of note, she had nausea with atorvastatin and developed this with Crestor, also. Lab Results  Component Value Date   CHOL 249 (H) 02/15/2018   HDL 33.90 (L) 02/15/2018   LDLCALC 76 09/25/2013   LDLDIRECT 173.0 02/15/2018   TRIG 324.0 (H) 02/15/2018   CHOLHDL 7 02/15/2018  - Continues off Crestor, but at this visit we discussed that she can also try to take it once a week and then try to move it closer together, as tolerated - she agrees to do so.  3. Obesity class II -She initially lost 12 pounds after starting Victoza, even at the lowest dose and also after changing her diet.  Afterwards, she changed 24 more pounds before last visit. - gained 3 lbs since last OV (steroids?)  Philemon Kingdom, MD PhD [facility] Endocr[facility]

## 2018-10-05 NOTE — Patient Instructions (Addendum)
Please continue: - Metformin1000 mg 2x a day - Victoza 1.2 mg daily in am - Lantus 25 units daily at bedtime  Restart Crestor 10 mg once a week. Move it closer togther over the next few weeks as tolerated.  Please return in 4 months with your sugar log.

## 2018-10-05 NOTE — Addendum Note (Signed)
Addended by: Cardell Peach I on: 10/05/2018 08:45 AM   Modules accepted: Orders

## 2018-12-08 ENCOUNTER — Other Ambulatory Visit: Payer: Self-pay | Admitting: Internal Medicine

## 2018-12-08 DIAGNOSIS — Z1231 Encounter for screening mammogram for malignant neoplasm of breast: Secondary | ICD-10-CM

## 2018-12-12 ENCOUNTER — Ambulatory Visit
Admission: RE | Admit: 2018-12-12 | Discharge: 2018-12-12 | Disposition: A | Discharge status: Home / Self Care | Payer: PPO | Source: Ambulatory Visit | Attending: Internal Medicine | Admitting: Internal Medicine

## 2018-12-12 DIAGNOSIS — Z1231 Encounter for screening mammogram for malignant neoplasm of breast: Secondary | ICD-10-CM | POA: Insufficient documentation

## 2018-12-13 ENCOUNTER — Encounter: Payer: Self-pay | Admitting: Internal Medicine

## 2018-12-13 ENCOUNTER — Other Ambulatory Visit: Payer: Self-pay | Admitting: Internal Medicine

## 2018-12-13 DIAGNOSIS — N6320 Unspecified lump in the left breast, unspecified quadrant: Secondary | ICD-10-CM

## 2018-12-13 DIAGNOSIS — R928 Other abnormal and inconclusive findings on diagnostic imaging of breast: Secondary | ICD-10-CM

## 2018-12-13 DIAGNOSIS — R921 Mammographic calcification found on diagnostic imaging of breast: Secondary | ICD-10-CM

## 2018-12-13 MED ORDER — VICTOZA 18 MG/3ML ~~LOC~~ SOPN: PEN_INJECTOR | SUBCUTANEOUS | 3 refills | Status: DC

## 2018-12-25 ENCOUNTER — Ambulatory Visit
Admission: RE | Admit: 2018-12-25 | Discharge: 2018-12-25 | Disposition: A | Discharge status: Home / Self Care | Payer: PPO | Source: Ambulatory Visit | Attending: Internal Medicine | Admitting: Internal Medicine

## 2018-12-25 ENCOUNTER — Other Ambulatory Visit: Payer: Self-pay | Admitting: Internal Medicine

## 2018-12-25 DIAGNOSIS — R921 Mammographic calcification found on diagnostic imaging of breast: Secondary | ICD-10-CM | POA: Diagnosis not present

## 2018-12-25 DIAGNOSIS — R928 Other abnormal and inconclusive findings on diagnostic imaging of breast: Secondary | ICD-10-CM

## 2018-12-25 DIAGNOSIS — N6489 Other specified disorders of breast: Secondary | ICD-10-CM | POA: Diagnosis not present

## 2018-12-25 DIAGNOSIS — N6320 Unspecified lump in the left breast, unspecified quadrant: Secondary | ICD-10-CM | POA: Insufficient documentation

## 2019-01-05 ENCOUNTER — Ambulatory Visit
Admission: RE | Admit: 2019-01-05 | Discharge: 2019-01-05 | Disposition: A | Discharge status: Home / Self Care | Payer: PPO | Source: Ambulatory Visit | Attending: Internal Medicine | Admitting: Internal Medicine

## 2019-01-05 ENCOUNTER — Other Ambulatory Visit: Payer: Self-pay

## 2019-01-05 DIAGNOSIS — N6081 Other benign mammary dysplasias of right breast: Secondary | ICD-10-CM | POA: Diagnosis not present

## 2019-01-05 DIAGNOSIS — R928 Other abnormal and inconclusive findings on diagnostic imaging of breast: Secondary | ICD-10-CM

## 2019-01-05 DIAGNOSIS — R921 Mammographic calcification found on diagnostic imaging of breast: Secondary | ICD-10-CM

## 2019-01-05 HISTORY — PX: BREAST BIOPSY: SHX20

## 2019-01-08 LAB — SURGICAL PATHOLOGY

## 2019-01-10 ENCOUNTER — Telehealth: Payer: Self-pay | Admitting: Internal Medicine

## 2019-01-10 DIAGNOSIS — N6039 Fibrosclerosis of unspecified breast: Secondary | ICD-10-CM

## 2019-01-10 NOTE — Telephone Encounter (Signed)
[facility] called in regards to the patient .  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  Vaughan Basta has tried to contact the patient several times and Patient will not call back She stated that at this time with her not being able to contact the patient she has been instructed to send this over to the PCP so if they get in contact with her we can send the surgical referral   Biopsy report is in epic if you would like to look at this.    Phone- [phone]

## 2019-01-10 NOTE — Telephone Encounter (Signed)
I tried calling and left a generic message Please try to get her again sometime  The breast biopsy did not show cancer fortunately--but did show some changes that make evaluation by a breast surgeon a good idea. I can go ahead with that, but wanted to touch base with her first (I would recommend Dr Bary Castilla in [place])

## 2019-01-15 NOTE — Addendum Note (Signed)
Addended by: [name] I on: 01/15/2019 04:27 PM   Modules accepted: Orders

## 2019-01-15 NOTE — Telephone Encounter (Signed)
Spoke to pt. She said she wants an appointment after the holidays.

## 2019-01-15 NOTE — Telephone Encounter (Signed)
Let her know that I put in for the referral and asked for them to hold off till after the holidays

## 2019-01-16 NOTE — Telephone Encounter (Signed)
Left message on vm per dpr 

## 2019-02-06 ENCOUNTER — Other Ambulatory Visit: Payer: Self-pay

## 2019-02-06 ENCOUNTER — Encounter: Payer: Self-pay | Admitting: Internal Medicine

## 2019-02-06 ENCOUNTER — Ambulatory Visit (INDEPENDENT_AMBULATORY_CARE_PROVIDER_SITE_OTHER): Payer: PPO | Admitting: Internal Medicine

## 2019-02-06 VITALS — BP 118/80 | HR 92 | Ht 65.0 in | Wt 205.0 lb

## 2019-02-06 DIAGNOSIS — E785 Hyperlipidemia, unspecified: Secondary | ICD-10-CM | POA: Diagnosis not present

## 2019-02-06 DIAGNOSIS — E669 Obesity, unspecified: Secondary | ICD-10-CM

## 2019-02-06 DIAGNOSIS — E1149 Type 2 diabetes mellitus with other diabetic neurological complication: Secondary | ICD-10-CM

## 2019-02-06 DIAGNOSIS — E1165 Type 2 diabetes mellitus with hyperglycemia: Secondary | ICD-10-CM

## 2019-02-06 DIAGNOSIS — IMO0002 Reserved for concepts with insufficient information to code with codable children: Secondary | ICD-10-CM

## 2019-02-06 LAB — POCT GLYCOSYLATED HEMOGLOBIN (HGB A1C): Hemoglobin A1C: 7.6 % — AB (ref 4.0–5.6)

## 2019-02-06 MED ORDER — ONETOUCH VERIO VI STRP: ORAL_STRIP | 12 refills | Status: DC

## 2019-02-06 MED ORDER — VICTOZA 18 MG/3ML ~~LOC~~ SOPN: PEN_INJECTOR | SUBCUTANEOUS | 3 refills | Status: DC

## 2019-02-06 NOTE — Addendum Note (Signed)
Addended by: Cardell Peach I on: 02/06/2019 09:34 AM   Modules accepted: Orders

## 2019-02-06 NOTE — Progress Notes (Signed)
Patient ID: Jacqueline Cole, female   DOB: [birth year], 68 y.o.   MRN: [id number]   This visit occurred during the SARS-CoV-2 public health emergency.  Safety protocols were in place, including screening questions prior to the visit, additional usage of staff PPE, and extensive cleaning of exam room while observing appropriate contact time as indicated for disinfecting solutions.   HPI: Jacqueline Cole is a 68 y.o.-year-old female, returning for follow-up for DM2, dx in 1997, insulin-dependent since 2000, uncontrolled, with complications (diabetic retinopathy, mild CKD, peripheral neuropathy).  Last visit 4 months ago.  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  She is under investigation for this.  She relaxed her sugars control 2/2 recent stress.  Reviewed HbA1c levels: Lab Results  Component Value Date   HGBA1C 6.9 (A) 10/05/2018   HGBA1C 6.9 (A) 06/07/2018   HGBA1C 6.7 (A) 02/01/2018   Pt is on a regimen of: - Metformin 1000 mg 2x a day, with meals - Lantus 60 units 2x a day >>  40 units 2x a day >> 30 units 2x a day >> 15 units in a.m. and 15 units at night >> 25 units at night - Victoza 0.6 mg daily - started 07/2017 >> increased to 0.6 mg daily + 3 clicks >> 1.2 mg daily She was previously on Onglyza-developed facial rash on left side of face.  Pt checks her sugars 1-2 times a day - am:  87-128, 153, 182 >> 83-148, 181 >> 87-150, 177, 197 - 2h after b'fast: 413 >> n/c >> 73-147, 168 >> n/c - before lunch: 105, 184, 196 >> 79-127, 140 >> 87-106 >> n/c - 2h after lunch: 377, 420, 500  >> n/c >> 164, 277 >> n/c - before dinner: 92, 156-176, 220 >> 77, 147 >> n/c - 2h after dinner: 391 >> n/c >> 204 >> n/c - bedtime: n/c - nighttime: n/c Lowest sugar was 64 >> 87 >> 83 >> 87; she has hypoglycemia awareness in the 80s. Highest sugar was  204 (HA) >> 277 >> 181 >> 197.  Glucometer: One Touch ultra >> Accu-Chek  Pt's meals are: - Breakfast: eggs, , sliced tomatoes >> toast, Propel water - Lunch: sandwich, pretzels >> half a sandwich + half an apple - Dinner: meat + 2 veggies - Snacks: fruit, almonds  -+ CKD, last BUN/creatinine:  Lab Results  Component Value Date   BUN 9 02/15/2018   BUN 10 12/13/2016   CREATININE 1.13 02/15/2018   CREATININE 1.18 12/13/2016   -+ HL; last set of lipids: Lab Results  Component Value Date   CHOL 249 (H) 02/15/2018   HDL 33.90 (L) 02/15/2018   LDLCALC 76 09/25/2013   LDLDIRECT 173.0 02/15/2018   TRIG 324.0 (H) 02/15/2018   CHOLHDL 7 02/15/2018  She had nausea with atorvastatin 10.  We restarted Crestor 10 at last visit. Continues to take this once a week. - last eye exam 02/2018: + DR; + history of surgery and IO injections, but she had headaches from these.  She sees Dr. Rodolph Cole. -She has numbness and tingling in her feet.   Pt has no FH of DM.  She has a history of hysterectomy in 2001.  She was on LT4 as a teenager >> stopped a long time ago as her TFTs normalized.  At last visit,  we recheck her TFTs and these were normal.  ROS: Constitutional: + weight gain/no weight loss, no fatigue, no subjective hyperthermia, no subjective hypothermia Eyes: no blurry vision, no xerophthalmia ENT: no sore throat, no nodules palpated in neck, no dysphagia, no odynophagia, no hoarseness Cardiovascular: no CP/no SOB/no palpitations/no leg swelling Respiratory: no cough/no SOB/no wheezing Gastrointestinal: no N/no V/no D/no C/no acid reflux Musculoskeletal: no muscle aches/no joint aches Skin: no rashes, no hair loss Neurological: no tremors/+ numbness/+ tingling/no dizziness  I reviewed pt's medications, allergies, PMH, social hx, family hx, and changes were documented in the history of present illness. Otherwise, unchanged from my initial visit note.  Past Medical History:  Diagnosis  Date  . Depression   . Diabetes mellitus type II   . Hyperlipemia   . Major depressive disorder, recurrent, in remission ([place]) 09/01/2006   Qualifier: Diagnosis of  By: Lelon Mast    . Obesity   . Osteopenia   . Syncope 1998   DM diagnosis   Past Surgical History:  Procedure Laterality Date  . ABDOMINAL HYSTERECTOMY  ~2005   and BSO  . BREAST BIOPSY Right 01/05/2019   rt stereo bx 2 areas 1st area ribbon clip  . BREAST BIOPSY Right 01/05/2019   rt stereo bx 2nd area coil clip  . CHOLECYSTECTOMY  1975  . COSMETIC SURGERY     Injury Right face as a child  . VAGINAL DELIVERY     X 2   Social History   Socioeconomic History  . Marital status: Married    Spouse name: Not on file  . Number of children: 2  . Years of education: Not on file  . Highest education level: Not on file  Occupational History    Comment: "Just up and quit" at [employer]  . Financial resource strain: Not on file  . Food insecurity    Worry: Not on file    Inability: Not on file  . Transportation needs    Medical: Not on file    Non-medical: Not on file  Tobacco Use  . Smoking status: Former Smoker    Quit date: 03/15/1993    Years since quitting: 25.9  . Smokeless tobacco: Never Used  Substance and Sexual Activity  . Alcohol use: No    Alcohol/week: 0.0 standard drinks  . Drug use: No  . Sexual activity: Not on file  Lifestyle  . Physical activity    Days per week: Not on file    Minutes per session: Not on file  . Stress: Not on file  Relationships  . Social Herbalist on phone: Not on file    Gets together: Not on file    Attends religious service: Not on file    Active member of club or organization: Not on file    Attends meetings of clubs or organizations: Not on file    Relationship status: Not on file  . Intimate partner violence    Fear of current or ex partner: Not on file    Emotionally abused: Not on file    Physically abused: Not on file     Forced sexual activity: Not on file  Other Topics Concern  . Not on file  Social History Narrative   Has living will   Husband, then daughter Jacqueline Cole, have health care POA   Would reluctantly accept resuscitation but no life prolonging measures   Current Outpatient Medications on File Prior to Visit  Medication  Sig Dispense Refill  . Blood Glucose Monitoring Suppl ([place]) w/Device KIT Use to check blood sugar 2 times a day. 1 kit 0  . FLUoxetine (PROZAC) 20 MG capsule Take 1 capsule (20 mg total) by mouth daily. 90 capsule 3  . ibuprofen (ADVIL,MOTRIN) 200 MG tablet Take 200 mg by mouth every 6 (six) hours as needed.    . Insulin Syringe-Needle U-100 25G X 5/8" 1 ML MISC by Does not apply route. Use daily as directed (may substitute for insurance)     . Lancets (ONETOUCH ULTRASOFT) lancets Use as instructed to check blood sugar 2 times a day. 200 each 12  . LANTUS 100 UNIT/ML injection Inject 25 units at bedtime - under skin. Dx: E11.41 3 vial 3  . liraglutide (VICTOZA) 18 MG/3ML SOPN Inject under skin 1.2 mg daily in am 9 mL 3  . metFORMIN (GLUCOPHAGE) 1000 MG tablet Take 1 tablet (1,000 mg total) by mouth 2 (two) times daily. 180 tablet 3  . naproxen sodium (ALEVE) 220 MG tablet Take 220 mg by mouth.    . rosuvastatin (CRESTOR) 10 MG tablet Take 1 tablet (10 mg total) by mouth daily. 30 tablet 11  . venlafaxine XR (EFFEXOR-XR) 150 MG 24 hr capsule Take 1 capsule (150 mg total) by mouth 2 (two) times daily. 180 capsule 3   No current facility-administered medications on file prior to visit.    Allergies  Allergen Reactions  . Onglyza [Saxagliptin] Hives and Nausea And Vomiting  . Liraglutide Nausea Only    Can't eat, nausea, higher sugars   Family History  Problem Relation Age of Onset  . Stroke Father        CVA  . Heart disease Father        MI  . Cancer Sister        breast cancer  . Breast cancer Sister 61  . Heart disease Brother        MI  . Cancer  Brother        Lung    PE: BP 118/80   Pulse 92   Ht _0  (1.651 m)   Wt 205 lb (93 kg)   SpO2 97%   BMI 34.11 kg/m  Wt Readings from Last 3 Encounters:  02/06/19 205 lb (93 kg)  10/05/18 201 lb (91.2 kg)  06/07/18 198 lb (89.8 kg)   Constitutional: overweight, in NAD Eyes: PERRLA, EOMI, no exophthalmos ENT: moist mucous membranes, no thyromegaly, no cervical lymphadenopathy Cardiovascular: RRR, No MRG Respiratory: CTA B Gastrointestinal: abdomen soft, NT, ND, BS+ Musculoskeletal: no deformities, strength intact in all 4 Skin: moist, warm, no rashes Neurological: no tremor with outstretched hands, DTR normal in all 4  ASSESSMENT: 1. DM2, insulin-dependent, uncontrolled, with complications - DR - PN - mild CKD  2. HL  3. Obesity class II BMI Classification:  < 18.5 underweight   18.5-24.9 normal weight   25.0-29.9 overweight   30.0-34.9 class I obesity   35.0-39.9 class II obesity   ? 40.0 class III obesity   PLAN:  1. Patient with longstanding, uncontrolled, type 2 diabetes, on oral antidiabetic regimen (metformin), daily GLP-1 receptor agonist and also daily long-acting insulin.  Her sugars improved significantly after adding the dose and we were able to decrease her Lantus dose.  She also lost a significant amount of weight but she started to gain this back.  At last visit, sugars were at or close to goal with few exceptions, however, she was  not checking sugars later in the day and I strongly advised her to start this.  She had a high blood sugars at 181 while on prednisone for shingles.  We did not change the regimen then.  HbA1c was stable, at 6.9%. -At this visit, sugars are mostly at goal in the morning with few exceptions due to dietary indiscretions.  Highest blood sugar was in the 190s.  No lows.  She still does not check sugars later in the day and I again advised her to do so.  However, since her  HbA1c: 7.6% - higher, I suspect that her sugars are  increasing throughout the day so we discussed about increasing Victoza to 1.8 mg daily.  She tolerates this well. - I suggested to:  Patient Instructions  Please continue: - Metformin 1000 mg 2x a day - Lantus 25 units daily at bedtime  Please increase: - Victoza to 1.8 mg daily in a.m. - Crestor to 10 mg on Sun and Wed  Check some sugars later in the day.  Please return in 4 months with your sugar log.  - advised to check sugars at different times of the day - 1-2x a day, rotating check times - advised for yearly eye exams >> she is UTD - return to clinic in 4 months   2. HL -Reviewed latest lipid panel from 02/2018: LDL was very high, as were the triglycerides.  HDL was low: Lab Results  Component Value Date   CHOL 249 (H) 02/15/2018   HDL 33.90 (L) 02/15/2018   LDLCALC 76 09/25/2013   LDLDIRECT 173.0 02/15/2018   TRIG 324.0 (H) 02/15/2018   CHOLHDL 7 02/15/2018  -At last visit, I advised her to start Crestor weekly and then try to move it closer if she tolerates it well.  Of note, she had nausea with atorvastatin and also with Crestor.  At this visit, she is on Crestor once a week and she tolerates it well.  I advised her to increase this to twice a week.  3. Obesity class II -Gained 4 pounds since last visit, and another 3 pounds since the previous visit -Continue Victoza which should also help with weight loss and we will increase the dose now  Philemon Kingdom, MD PhD [facility] End[facility]

## 2019-02-06 NOTE — Patient Instructions (Addendum)
Please continue: - Metformin 1000 mg 2x a day - Lantus 25 units daily at bedtime  Please increase: - Victoza to 1.8 mg daily in a.m. - Crestor to 10 mg on Sun and Wed  Check some sugars later in the day.  Please return in 4 months with your sugar log.

## 2019-02-12 ENCOUNTER — Other Ambulatory Visit: Payer: Self-pay | Admitting: Internal Medicine

## 2019-02-12 DIAGNOSIS — Z1211 Encounter for screening for malignant neoplasm of colon: Secondary | ICD-10-CM

## 2019-04-24 ENCOUNTER — Ambulatory Visit (INDEPENDENT_AMBULATORY_CARE_PROVIDER_SITE_OTHER): Payer: PPO | Admitting: Internal Medicine

## 2019-04-24 ENCOUNTER — Other Ambulatory Visit: Payer: Self-pay

## 2019-04-24 ENCOUNTER — Encounter: Payer: Self-pay | Admitting: Internal Medicine

## 2019-04-24 VITALS — BP 112/72 | HR 80 | Temp 96.4°F | Ht 65.0 in | Wt 203.0 lb

## 2019-04-24 DIAGNOSIS — E1165 Type 2 diabetes mellitus with hyperglycemia: Secondary | ICD-10-CM | POA: Diagnosis not present

## 2019-04-24 DIAGNOSIS — E1149 Type 2 diabetes mellitus with other diabetic neurological complication: Secondary | ICD-10-CM | POA: Diagnosis not present

## 2019-04-24 DIAGNOSIS — F331 Major depressive disorder, recurrent, moderate: Secondary | ICD-10-CM | POA: Diagnosis not present

## 2019-04-24 DIAGNOSIS — D241 Benign neoplasm of right breast: Secondary | ICD-10-CM

## 2019-04-24 DIAGNOSIS — IMO0002 Reserved for concepts with insufficient information to code with codable children: Secondary | ICD-10-CM

## 2019-04-24 DIAGNOSIS — D242 Benign neoplasm of left breast: Secondary | ICD-10-CM | POA: Diagnosis not present

## 2019-04-24 LAB — COMPREHENSIVE METABOLIC PANEL
ALT: 16 U/L (ref 0–35)
AST: 22 U/L (ref 0–37)
Albumin: 4.3 g/dL (ref 3.5–5.2)
Alkaline Phosphatase: 74 U/L (ref 39–117)
BUN: 16 mg/dL (ref 6–23)
CO2: 30 mEq/L (ref 19–32)
Calcium: 9.8 mg/dL (ref 8.4–10.5)
Chloride: 103 mEq/L (ref 96–112)
Creatinine, Ser: 1.1 mg/dL (ref 0.40–1.20)
GFR: 49.41 mL/min — ABNORMAL LOW (ref >60.00)
Glucose, Bld: 163 mg/dL — ABNORMAL HIGH (ref 70–99)
Potassium: 4.1 mEq/L (ref 3.5–5.1)
Sodium: 140 mEq/L (ref 135–145)
Total Bilirubin: 0.9 mg/dL (ref 0.2–1.2)
Total Protein: 6.9 g/dL (ref 6.0–8.3)

## 2019-04-24 LAB — CBC
HCT: 41.5 % (ref 36.0–46.0)
Hemoglobin: 13.8 g/dL (ref 12.0–15.0)
MCHC: 33.3 g/dL (ref 30.0–36.0)
MCV: 91.8 fl (ref 78.0–100.0)
Platelets: 276 10*3/uL (ref 150.0–400.0)
RBC: 4.52 Mil/uL (ref 3.87–5.11)
RDW: 13.8 % (ref 11.5–15.5)
WBC: 9.9 10*3/uL (ref 4.0–10.5)

## 2019-04-24 LAB — LIPID PANEL
Cholesterol: 257 mg/dL — ABNORMAL HIGH (ref 0–200)
HDL: 39.3 mg/dL (ref >39.00)
NonHDL: 217.37
Total CHOL/HDL Ratio: 7
Triglycerides: 264 mg/dL — ABNORMAL HIGH (ref 0.0–149.0)
VLDL: 52.8 mg/dL — ABNORMAL HIGH (ref 0.0–40.0)

## 2019-04-24 LAB — HM DIABETES FOOT EXAM

## 2019-04-24 LAB — LDL CHOLESTEROL, DIRECT: Direct LDL: 174 mg/dL

## 2019-04-24 MED ORDER — GABAPENTIN 100 MG PO CAPS: 100.0000 mg | ORAL_CAPSULE | Freq: Every day | ORAL | 3 refills | Status: DC

## 2019-04-24 MED ORDER — FLUOXETINE HCL 20 MG PO CAPS: 20.0000 mg | ORAL_CAPSULE | Freq: Every day | ORAL | 3 refills | Status: DC

## 2019-04-24 NOTE — Patient Instructions (Addendum)
Please restart the fluoxetine. Try the gabapentin at night for the foot nerve pain. Start with 1 capsule and increase every week up to 3 capsules if needed. Let me know if that doesn't work. You should stop the PM med and ibuprofen. Let me know if Dr Dwyane Luo office doesn't call about the breast issue. Return that fecal test kit!!

## 2019-04-24 NOTE — Progress Notes (Signed)
Subjective:    Patient ID: Jacqueline Cole, female    DOB: 15-Jan-1951, 69 y.o.   MRN: [id number]  HPI Here for follow up of depression, diabetes and other chronic health conditions This visit occurred during the SARS-CoV-2 public health emergency.  Safety protocols were in place, including screening questions prior to the visit, additional usage of staff PPE, and extensive cleaning of exam room while observing appropriate contact time as indicated for disinfecting solutions.   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 Blood Glucose Monitoring Suppl ([place]) w/Device KIT Use to check blood sugar 2 times a day. 1 kit 0  . FLUoxetine (PROZAC) 20 MG capsule Take 1 capsule (20 mg total) by mouth daily. 90 capsule 3  . glucose blood (ONETOUCH VERIO) test strip Use as instructed to check blood sugar 2 times a day. 200 each 12  . ibuprofen (ADVIL,MOTRIN) 200 MG tablet Take 200 mg by mouth every 6 (six) hours as needed.    . Insulin Syringe-Needle U-100 25G X 5/8" 1 ML MISC by Does not apply route. Use daily as directed (may substitute for insurance)     . Lancets (ONETOUCH ULTRASOFT) lancets Use as instructed to check blood sugar 2 times a day. 200 each 12  . LANTUS 100 UNIT/ML injection Inject 25 units at bedtime - under skin. Dx: E11.41 3 vial 3  . liraglutide (VICTOZA) 18 MG/3ML SOPN Inject under skin 1.8 mg daily in am 9 mL 3  . metFORMIN  (GLUCOPHAGE) 1000 MG tablet Take 1 tablet (1,000 mg total) by mouth 2 (two) times daily. 180 tablet 3  . naproxen sodium (ALEVE) 220 MG tablet Take 220 mg by mouth.    . rosuvastatin (CRESTOR) 10 MG tablet Take 1 tablet (10 mg total) by mouth daily. 30 tablet 11  . venlafaxine XR (EFFEXOR-XR) 150 MG 24 hr capsule Take 1 capsule (150 mg total) by mouth 2 (two) times daily. 180 capsule 3   No current facility-administered medications on file prior to visit.    Allergies  Allergen Reactions  . Onglyza [Saxagliptin] Hives and Nausea And Vomiting  . Liraglutide Nausea Only    Can't eat, nausea, higher sugars    Past Medical History:  Diagnosis Date  . Depression   . Diabetes mellitus type II   . Hyperlipemia   . Major depressive disorder, recurrent, in remission ([place]) 09/01/2006   Qualifier: Diagnosis of  By: Lelon Mast    . Obesity   . Osteopenia   . Syncope 1998   DM diagnosis    Past Surgical History:  Procedure Laterality Date  . ABDOMINAL HYSTERECTOMY  ~2005   and BSO  . BREAST BIOPSY Right 01/05/2019   rt stereo bx 2 areas 1st area ribbon clip  . BREAST BIOPSY Right 01/05/2019   rt stereo bx 2nd area coil clip  . CHOLECYSTECTOMY  1975  . COSMETIC SURGERY     Injury Right face as a child  . VAGINAL DELIVERY  X 2    Family History  Problem Relation Age of Onset  . Stroke Father        CVA  . Heart disease Father        MI  . Cancer Sister        breast cancer  . Breast cancer Sister 69  . Heart disease Brother        MI  . Cancer Brother        Lung    Social History   Socioeconomic History  . Marital status: Married    Spouse name: Not on file  . Number of children: 2  . Years of education: Not on file  . Highest education level: Not on file  Occupational History    Comment: "Just up and quit" at [employer] Use  . Smoking status: Former Smoker    Quit date: 03/15/1993    Years since quitting: 26.1  . Smokeless tobacco: Never Used    Substance and Sexual Activity  . Alcohol use: No    Alcohol/week: 0.0 standard drinks  . Drug use: No  . Sexual activity: Not on file  Other Topics Concern  . Not on file  Social History Narrative   Has living will   Husband, then daughter [name], have health care POA   Would reluctantly accept resuscitation but no life prolonging measures   Social Determinants of Health   Financial Resource Strain:   . Difficulty of Paying Living Expenses: Not on file  Food Insecurity:   . Worried About Charity fundraiser in the Last Year: Not on file  . Ran Out of Food in the Last Year: Not on file  Transportation Needs:   . Lack of Transportation (Medical): Not on file  . Lack of Transportation (Non-Medical): Not on file  Physical Activity:   . Days of Exercise per Week: Not on file  . Minutes of Exercise per Session: Not on file  Stress:   . Feeling of Stress : Not on file  Social Connections:   . Frequency of Communication with Friends and Family: Not on file  . Frequency of Social Gatherings with Friends and Family: Not on file  . Attends Religious Services: Not on file  . Active Member of Clubs or Organizations: Not on file  . Attends Archivist Meetings: Not on file  . Marital Status: Not on file  Intimate Partner Violence:   . Fear of Current or Ex-Partner: Not on file  . Emotionally Abused: Not on file  . Physically Abused: Not on file  . Sexually Abused: Not on file   Review of Systems Sleep is not that good---uses advil pm Appetite is so-so Weight is down from a year ago    Objective:   Physical Exam  Constitutional: She appears well-developed. No distress.  Neck: No thyromegaly present.  Cardiovascular: Normal rate, regular rhythm, normal heart sounds and intact distal pulses. Exam reveals no gallop.  No murmur heard. Respiratory: Effort normal and breath sounds normal. No respiratory distress. She has no wheezes. She has no rales.  GI: Soft. There is no  abdominal tenderness.  Lymphadenopathy:    She has no cervical adenopathy.  Neurological:  Decreased sensation in feet  Skin:  No foot lesions  Psychiatric:  No sig obvious depression today           Assessment & Plan:

## 2019-04-24 NOTE — Assessment & Plan Note (Signed)
Changes and stromal fibrosis Was supposed to see surgeon then confused about which one Will set back up with Dr Bary Castilla

## 2019-04-24 NOTE — Assessment & Plan Note (Signed)
Worse now Has been off the fluoxetine---will restart Continue the venlafaxine as well

## 2019-04-24 NOTE — Assessment & Plan Note (Signed)
Control better but still suboptimal liraglutide increased Will start low dose gabapentin for symptomatic neuropathy at night

## 2019-04-27 ENCOUNTER — Telehealth: Payer: Self-pay

## 2019-04-27 NOTE — Telephone Encounter (Signed)
Results released  Find out if she is taking the rosuvastatin. If not, even 1-2 days a week could reduce her risk of a heart attack

## 2019-05-01 NOTE — Telephone Encounter (Signed)
Spoke to pt. She said when she saw the results she went back on it. She said her mind doesn't function when she is on it. She will try every other day or twice a week.

## 2019-05-08 ENCOUNTER — Other Ambulatory Visit: Payer: Self-pay | Admitting: Internal Medicine

## 2019-05-15 DIAGNOSIS — R928 Other abnormal and inconclusive findings on diagnostic imaging of breast: Secondary | ICD-10-CM | POA: Diagnosis not present

## 2019-06-07 ENCOUNTER — Other Ambulatory Visit: Payer: Self-pay

## 2019-06-07 ENCOUNTER — Encounter: Payer: Self-pay | Admitting: Internal Medicine

## 2019-06-07 ENCOUNTER — Ambulatory Visit: Payer: PPO | Admitting: Internal Medicine

## 2019-06-07 VITALS — BP 130/70 | HR 90 | Ht 65.0 in | Wt 202.0 lb

## 2019-06-07 DIAGNOSIS — E66812 Obesity, class 2: Secondary | ICD-10-CM

## 2019-06-07 DIAGNOSIS — E1165 Type 2 diabetes mellitus with hyperglycemia: Secondary | ICD-10-CM | POA: Diagnosis not present

## 2019-06-07 DIAGNOSIS — E669 Obesity, unspecified: Secondary | ICD-10-CM | POA: Diagnosis not present

## 2019-06-07 DIAGNOSIS — E1149 Type 2 diabetes mellitus with other diabetic neurological complication: Secondary | ICD-10-CM

## 2019-06-07 DIAGNOSIS — IMO0002 Reserved for concepts with insufficient information to code with codable children: Secondary | ICD-10-CM

## 2019-06-07 DIAGNOSIS — E785 Hyperlipidemia, unspecified: Secondary | ICD-10-CM | POA: Diagnosis not present

## 2019-06-07 LAB — LIPID PANEL
Cholesterol: 120 mg/dL (ref 0–200)
HDL: 36.6 mg/dL — ABNORMAL LOW (ref >39.00)
LDL Cholesterol: 46 mg/dL (ref 0–99)
NonHDL: 83.51
Total CHOL/HDL Ratio: 3
Triglycerides: 190 mg/dL — ABNORMAL HIGH (ref 0.0–149.0)
VLDL: 38 mg/dL (ref 0.0–40.0)

## 2019-06-07 LAB — POCT GLYCOSYLATED HEMOGLOBIN (HGB A1C): Hemoglobin A1C: 6.6 % — AB (ref 4.0–5.6)

## 2019-06-07 MED ORDER — LANTUS 100 UNIT/ML ~~LOC~~ SOLN: SUBCUTANEOUS | 3 refills | Status: DC

## 2019-06-07 NOTE — Patient Instructions (Addendum)
Please continue: - Metformin 1000 mg 2x a day - Lantus 30 units daily at lunchtime - Victoza 1.8 mg daily in a.m.  Please return in 4 months with your sugar log.

## 2019-06-07 NOTE — Progress Notes (Signed)
Patient ID: Jacqueline Cole, female   DOB: 01-01-1951, 69 y.o.   MRN: [id number]   This visit occurred during the SARS-CoV-2 public health emergency.  Safety protocols were in place, including screening questions prior to the visit, additional usage of staff PPE, and extensive cleaning of exam room while observing appropriate contact time as indicated for disinfecting solutions.   HPI: Jacqueline Cole is a 69 y.o.-year-old female, returning for follow-up for DM2, dx in 1997, insulin-dependent since 2000, uncontrolled, with complications (diabetic retinopathy, mild CKD, peripheral neuropathy).  Last visit 4 months ago.  Reviewed HbA1c levels: Lab Results  Component Value Date   HGBA1C 7.6 (A) 02/06/2019   HGBA1C 6.9 (A) 10/05/2018   HGBA1C 6.9 (A) 06/07/2018   Pt is on a regimen of: - Metformin 1000 mg 2x a day, with meals - Lantus 60 units 2x a day >>  40 units 2x a day >> 30 units 2x a day >> 15 units in a.m. and 15 units at night >> 25 >> 30 units at night >> at lunch - Victoza 0.6 mg daily - started 07/2017 >> increased to 0.6 mg daily + 3 clicks >> 1.2 >> 1.8 mg daily She was previously on Onglyza-developed facial rash on left side of face.  Pt checks her sugars 1-2 times a day: - am:  87-128, 153, 182 >> 83-148, 181 >> 87-150, 177, 197 >> 75-125 - 2h after b'fast: 413 >> n/c >> 73-147, 168 >> n/c - before lunch: 105, 184, 196 >> 79-127, 140 >> 87-106 >> n/c >> 140s, 200 (snacked all am) - 2h after lunch: 377, 420, 500  >> n/c >> 164, 277 >> n/c - before dinner: 92, 156-176, 220 >> 77, 147 >> n/c - 2h after dinner: 391 >> n/c >> 204 >> n/c - bedtime: n/c - nighttime: n/c Lowest sugar was 64 >> 87 >> 83 >> 87 >> 75; she has hypoglycemia awareness in the 80s. Highest sugar was 204 (HA) >> 277 >> 181 >> 197 >> 200.  Glucometer: One Touch ultra >> Accu-Chek  Pt's meals are: - Breakfast: eggs, , sliced tomatoes >> toast, Propel water; rice crispies - Lunch: sandwich, pretzels >> half a  sandwich + half an apple - Dinner: meat + 2 veggies - Snacks: fruit, almonds  -+ Mild CKD, last BUN/creatinine:  Lab Results  Component Value Date   BUN 16 04/24/2019   BUN 9 02/15/2018   CREATININE 1.10 04/24/2019   CREATININE 1.13 02/15/2018   -+ HL; last set of lipids: Lab Results  Component Value Date   CHOL 257 (H) 04/24/2019   HDL 39.30 04/24/2019   LDLCALC 76 09/25/2013   LDLDIRECT 174.0 04/24/2019   TRIG 264.0 (H) 04/24/2019   CHOLHDL 7 04/24/2019  We restarted Crestor 10 and at last visit I advised her to move it from once a week to twice a week. Since last lipid panel >> PCP increased Crestor to daily.  In the past, she had nausea with atorvastatin 10 and Crestor.  She is now tolerating Crestor well. - last eye exam 03/2018: + DR + history of surgery and IO injections, but she had headaches from these.  She sees Dr. Rodolph Bong. Appt next week - + numbness and tingling in her feet. Started Neurontin 300 at bedtime.  Pt has no FH of DM.  She has a history of hysterectomy in 2001.  She was on LT4 as a teenager >> stopped a long time ago as her TFTs normalized.  Latest TSH levels were normal: Lab Results  Component Value Date   TSH 3.85 02/01/2018   TSH 2.54 09/08/2012   TSH 1.52 05/03/2011   TSH 1.93 12/30/2009   TSH 1.95 12/25/2008    ROS: Constitutional: no weight gain/no weight loss, no fatigue, no subjective hyperthermia, no subjective hypothermia Eyes: no blurry vision, no xerophthalmia ENT: no sore throat, no nodules palpated in neck, no dysphagia, no odynophagia, no hoarseness Cardiovascular: no CP/no SOB/no palpitations/no leg swelling Respiratory: no cough/no SOB/no wheezing Gastrointestinal: no N/no V/no D/no C/no acid reflux Musculoskeletal: no muscle aches/no joint aches Skin: no rashes, no hair loss Neurological: no tremors/+ numbness/+ tingling/no dizziness  I reviewed pt's medications, allergies, PMH, social hx, family hx, and changes were  documented in the history of present illness. Otherwise, unchanged from my initial visit note.  Past Medical History:  Diagnosis Date  . Depression   . Diabetes mellitus type II   . Hyperlipemia   . Major depressive disorder, recurrent, in remission ([place]) 09/01/2006   Qualifier: Diagnosis of  By: Lelon Mast    . Obesity   . Osteopenia   . Syncope 1998   DM diagnosis   Past Surgical History:  Procedure Laterality Date  . ABDOMINAL HYSTERECTOMY  ~2005   and BSO  . BREAST BIOPSY Right 01/05/2019   rt stereo bx 2 areas 1st area ribbon clip  . BREAST BIOPSY Right 01/05/2019   rt stereo bx 2nd area coil clip  . CHOLECYSTECTOMY  1975  . COSMETIC SURGERY     Injury Right face as a child  . VAGINAL DELIVERY     X 2   Social History   Socioeconomic History  . Marital status: Married    Spouse name: Not on file  . Number of children: 2  . Years of education: Not on file  . Highest education level: Not on file  Occupational History    Comment: "Just up and quit" at [employer]  . Smoking status: Former Smoker    Quit date: 03/15/1993    Years since quitting: 26.2  . Smokeless tobacco: Never Used  Substance and Sexual Activity  . Alcohol use: No    Alcohol/week: 0.0 standard drinks  . Drug use: No  . Sexual activity: Not on file  Other Topics Concern  . Not on file  Social History Narrative   Has living will   Husband, then daughter [name], have health care POA   Would reluctantly accept resuscitation but no life prolonging measures   Social Determinants of Health   Financial Resource Strain:   . Difficulty of Paying Living Expenses:   Food Insecurity:   . Worried About Charity fundraiser in the Last Year:   . Arboriculturist in the Last Year:   Transportation Needs:   . Film/video editor (Medical):   Marland Kitchen Lack of Transportation (Non-Medical):   Physical Activity:   . Days of Exercise per Week:   . Minutes of Exercise per Session:   Stress:   .  Feeling of Stress :   Social Connections:   . Frequency of Communication with Friends and Family:   . Frequency of Social Gatherings with Friends and Family:   . Attends Religious Services:   . Active Member of Clubs or Organizations:   . Attends Archivist Meetings:   Marland Kitchen Marital Status:   Intimate Partner Violence:   . Fear of Current or Ex-Partner:   . Emotionally  Abused:   Marland Kitchen Physically Abused:   . Sexually Abused:    Current Outpatient Medications on File Prior to Visit  Medication Sig Dispense Refill  . Blood Glucose Monitoring Suppl ([place]) w/Device KIT Use to check blood sugar 2 times a day. 1 kit 0  . FLUoxetine (PROZAC) 20 MG capsule Take 1 capsule (20 mg total) by mouth daily. 90 capsule 3  . gabapentin (NEURONTIN) 100 MG capsule Take 1-3 capsules (100-300 mg total) by mouth at bedtime. Increase weekly as needed 90 capsule 3  . glucose blood (ONETOUCH VERIO) test strip Use as instructed to check blood sugar 2 times a day. 200 each 12  . ibuprofen (ADVIL,MOTRIN) 200 MG tablet Take 200 mg by mouth every 6 (six) hours as needed.    . Insulin Syringe-Needle U-100 25G X 5/8" 1 ML MISC by Does not apply route. Use daily as directed (may substitute for insurance)     . Lancets (ONETOUCH ULTRASOFT) lancets Use as instructed to check blood sugar 2 times a day. 200 each 12  . LANTUS 100 UNIT/ML injection Inject 25 units at bedtime - under skin. Dx: E11.41 3 vial 3  . liraglutide (VICTOZA) 18 MG/3ML SOPN INJECT 1.2 MG  SUBCUTANEOUSLY IN THE MORNING 18 mL 0  . metFORMIN (GLUCOPHAGE) 1000 MG tablet Take 1 tablet (1,000 mg total) by mouth 2 (two) times daily. 180 tablet 3  . naproxen sodium (ALEVE) 220 MG tablet Take 220 mg by mouth.    . rosuvastatin (CRESTOR) 10 MG tablet Take 1 tablet (10 mg total) by mouth daily. 30 tablet 11  . venlafaxine XR (EFFEXOR-XR) 150 MG 24 hr capsule Take 1 capsule (150 mg total) by mouth 2 (two) times daily. 180 capsule 3   No  current facility-administered medications on file prior to visit.   Allergies  Allergen Reactions  . Onglyza [Saxagliptin] Hives and Nausea And Vomiting  . Liraglutide Nausea Only    Can't eat, nausea, higher sugars   Family History  Problem Relation Age of Onset  . Stroke Father        CVA  . Heart disease Father        MI  . Cancer Sister        breast cancer  . Breast cancer Sister 85  . Heart disease Brother        MI  . Cancer Brother        Lung    PE: BP 130/70   Pulse 90   Ht '5\' 5"'$  (1.651 m)   Wt 202 lb (91.6 kg)   SpO2 97%   BMI 33.61 kg/m  Wt Readings from Last 3 Encounters:  06/07/19 202 lb (91.6 kg)  04/24/19 203 lb (92.1 kg)  02/06/19 205 lb (93 kg)   Constitutional: overweight, in NAD Eyes: PERRLA, EOMI, no exophthalmos ENT: moist mucous membranes, no thyromegaly, no cervical lymphadenopathy Cardiovascular: RRR, No MRG Respiratory: CTA B Gastrointestinal: abdomen soft, NT, ND, BS+ Musculoskeletal: no deformities, strength intact in all 4 Skin: moist, warm, no rashes Neurological: no tremor with outstretched hands, DTR normal in all 4  ASSESSMENT: 1. DM2, insulin-dependent, uncontrolled, with complications - DR - PN - mild CKD  2. HL  3. Obesity class II BMI Classification:  < 18.5 underweight   18.5-24.9 normal weight   25.0-29.9 overweight   30.0-34.9 class I obesity   35.0-39.9 class II obesity   ? 40.0 class III obesity   PLAN:  1. Patient with longstanding,  uncontrolled, type 2 diabetes, on oral antidiabetic regimen with Metformin, also basal insulin and daily GLP-1 receptor agonist, increased at last visit.  At that time HbA1c was higher, at 7.6% and her sugars are mostly at goal in the morning with few exceptions due to dietary indiscretions.  No lows.  She did not check sugars later in the day and again I advised her to do so. -At this visit, sugars are better especially in the morning and they are slightly higher before  lunch but she snacks during the morning.  She did have one blood sugar in the 200s after a lot of snacking in a.m.  Overall, I do not feel she needs a change in the regimen, but she continues to work on her diet. - I suggested to:  Patient Instructions  Please continue: - Metformin 1000 mg 2x a day - Lantus 25 units daily at bedtime - Victoza 1.8 mg daily in a.m.  Check some sugars later in the day.  Please return in 4 months with your sugar log.   - we checked her HbA1c: 6.6% (improved) - advised to check sugars at different times of the day - 1-2x a day, rotating check times - advised for yearly eye exams >> she is not UTD but has an appointment scheduled - return to clinic in 4 months  2. HL -Reviewed latest lipid panel from 04/2019: Very high LDL, high triglycerides, HDL at goal Lab Results  Component Value Date   CHOL 257 (H) 04/24/2019   HDL 39.30 04/24/2019   LDLCALC 76 09/25/2013   LDLDIRECT 174.0 04/24/2019   TRIG 264.0 (H) 04/24/2019   CHOLHDL 7 04/24/2019  - At previous visits, I advised her to start Crestor weekly and then try to move it closer if she tolerated it well.  Of note, in the past she had nausea with atorvastatin and rosuvastatin.  At last visit she was on Crestor once a week and she tolerated it well.  We discussed about increasing it to twice a week.  She did so, but after the last lipid panel results, PCP advised her to increase it daily.  She has been doing this for the last month and a half. -We will recheck her lipid panel today, but not fasting  3. Obesity class II -Continue GLP-1 receptor agonist, which should also help with weight loss-we increased the dose at last visit -lost 3 ls since last [place] Visit on 06/07/2019  Component Date Value Ref Range Status  . Cholesterol 06/07/2019 120  0 - 200 mg/dL Final   ATP III Classification       Desirable:  < 200 mg/dL               Borderline High:  200 - 239 mg/dL          High:  > = 240 mg/dL  .  Triglycerides 06/07/2019 190.0* 0.0 - 149.0 mg/dL Final   Normal:  <150 mg/dLBorderline High:  150 - 199 mg/dL  . HDL 06/07/2019 36.60* >39.00 mg/dL Final  . VLDL 06/07/2019 38.0  0.0 - 40.0 mg/dL Final  . LDL Cholesterol 06/07/2019 46  0 - 99 mg/dL Final  . Total CHOL/HDL Ratio 06/07/2019 3   Final                  Men          Women1/2 Average Risk     3.4  3.3Average Risk          5.0          4.42X Average Risk          9.6          7.13X Average Risk          15.0          11.0                      . NonHDL 06/07/2019 83.51   Final   NOTE:  Non-HDL goal should be 30 mg/dL higher than patient's LDL goal (i.e. LDL goal of < 70 mg/dL, would have non-HDL goal of < 100 mg/dL)  . Hemoglobin A1C 06/07/2019 6.6* 4.0 - 5.6 % Final   Your lipid panel is much better!  Triglycerides slightly high, but nonfasting.  Philemon Kingdom, MD PhD [facility] Endocrinology

## 2019-06-20 ENCOUNTER — Other Ambulatory Visit: Payer: Self-pay | Admitting: General Surgery

## 2019-06-20 DIAGNOSIS — N6011 Diffuse cystic mastopathy of right breast: Secondary | ICD-10-CM

## 2019-07-06 ENCOUNTER — Other Ambulatory Visit: Payer: Self-pay | Admitting: Internal Medicine

## 2019-07-10 ENCOUNTER — Other Ambulatory Visit: Payer: PPO

## 2019-07-10 ENCOUNTER — Other Ambulatory Visit: Payer: Self-pay | Admitting: Internal Medicine

## 2019-07-13 ENCOUNTER — Telehealth: Payer: Self-pay | Admitting: Internal Medicine

## 2019-07-13 NOTE — Progress Notes (Signed)
  Chronic Care Management   Outreach Note  07/13/2019 Name: JANET GOELZ MRN: [id number] DOB: 1950/07/23  Referred by: Venia Carbon, MD Reason for referral : No chief complaint on file.   An unsuccessful telephone outreach was attempted today. The patient was referred to the pharmacist for assistance with care management and care coordination.    This note is not being shared with the patient for the following reason: To respect privacy (The patient or proxy has requested that the information not be shared).  Follow Up Plan:   Raynicia Dukes [employer] Scheduler

## 2019-07-15 ENCOUNTER — Other Ambulatory Visit: Payer: Self-pay | Admitting: Internal Medicine

## 2019-08-23 ENCOUNTER — Other Ambulatory Visit: Payer: Self-pay | Admitting: Internal Medicine

## 2019-09-07 ENCOUNTER — Other Ambulatory Visit: Payer: Self-pay | Admitting: Internal Medicine

## 2019-10-04 ENCOUNTER — Other Ambulatory Visit: Payer: Self-pay

## 2019-10-04 ENCOUNTER — Ambulatory Visit: Payer: PPO | Admitting: Internal Medicine

## 2019-10-04 ENCOUNTER — Encounter: Payer: Self-pay | Admitting: Internal Medicine

## 2019-10-04 VITALS — BP 120/88 | HR 85 | Ht 65.0 in | Wt 208.0 lb

## 2019-10-04 DIAGNOSIS — E1165 Type 2 diabetes mellitus with hyperglycemia: Secondary | ICD-10-CM | POA: Diagnosis not present

## 2019-10-04 DIAGNOSIS — IMO0002 Reserved for concepts with insufficient information to code with codable children: Secondary | ICD-10-CM

## 2019-10-04 DIAGNOSIS — E1149 Type 2 diabetes mellitus with other diabetic neurological complication: Secondary | ICD-10-CM | POA: Diagnosis not present

## 2019-10-04 DIAGNOSIS — E669 Obesity, unspecified: Secondary | ICD-10-CM | POA: Diagnosis not present

## 2019-10-04 DIAGNOSIS — E785 Hyperlipidemia, unspecified: Secondary | ICD-10-CM

## 2019-10-04 DIAGNOSIS — E66812 Obesity, class 2: Secondary | ICD-10-CM

## 2019-10-04 LAB — POCT GLYCOSYLATED HEMOGLOBIN (HGB A1C): Hemoglobin A1C: 8.3 % — AB (ref 4.0–5.6)

## 2019-10-04 MED ORDER — OZEMPIC (1 MG/DOSE) 4 MG/3ML ~~LOC~~ SOPN: 1.0000 mg | PEN_INJECTOR | SUBCUTANEOUS | 3 refills | Status: DC

## 2019-10-04 MED ORDER — LANTUS 100 UNIT/ML ~~LOC~~ SOLN: SUBCUTANEOUS | 3 refills | Status: DC

## 2019-10-04 NOTE — Addendum Note (Signed)
Addended by: Cardell Peach I on: 10/04/2019 09:52 AM   Modules accepted: Orders

## 2019-10-04 NOTE — Progress Notes (Signed)
Patient ID: Jacqueline Cole, female   DOB: Feb 26, 1951, 69 y.o.   MRN: [id number]   This visit occurred during the SARS-CoV-2 public health emergency.  Safety protocols were in place, including screening questions prior to the visit, additional usage of staff PPE, and extensive cleaning of exam room while observing appropriate contact time as indicated for disinfecting solutions.   HPI: Jacqueline Cole is a 69 y.o.-year-old female, returning for follow-up for DM2, dx in 1997, insulin-dependent since 2000, uncontrolled, with complications (diabetic retinopathy, mild CKD, peripheral neuropathy).  Last visit 4 months ago.  Reviewed HbA1c levels: Lab Results  Component Value Date   HGBA1C 6.6 (A) 06/07/2019   HGBA1C 7.6 (A) 02/06/2019   HGBA1C 6.9 (A) 10/05/2018   Pt is on a regimen of: - Metformin 1000 mg 2x a day, with meals - Lantus 60 units 2x a day >> ... >> 30 units at lunch >> 25 units 2x a day  -but stretching the supply 2/2 price -and using her friend's Lantus - Victoza 0.6 mg daily - started 07/2017 >> increased to 0.6 mg daily + 3 clicks >> 1.2 >> 1.8 mg daily - off for 1 mo b/c price She was previously on Onglyza-developed facial rash on left side of face.  Pt checks her sugars 1x a day: - am: 87-150, 177, 197 >> 75-125 >> 84-122, 213, 216 (before increasing Lantus to bid) - 2h after b'fast: 413 >> n/c >> 73-147, 168 >> n/c - before lunch: 87-106 >> n/c >> 140s, 200 (snacked all am) >> n/c - 2h after lunch: 377, 420, 500  >> n/c >> 164, 277 >> n/c - before dinner: 92, 156-176, 220 >> 77, 147 >> n/c - 2h after dinner: 391 >> n/c >> 204 >> n/c - bedtime: n/c - nighttime: n/c Lowest sugar was 75 >> 84; she has hypoglycemia awareness in the 80s. Highest sugar was 197 >> 200 >> 216.  Glucometer: One Touch ultra >> Accu-Chek  Pt's meals are: - Breakfast: eggs, , sliced tomatoes >> toast, Propel water; rice crispies - Lunch: sandwich, pretzels >> half a sandwich + half an apple -  Dinner: meat + 2 veggies - Snacks: fruit, almonds  -+ Mild CKD, last BUN/creatinine:  Lab Results  Component Value Date   BUN 16 04/24/2019   BUN 9 02/15/2018   CREATININE 1.10 04/24/2019   CREATININE 1.13 02/15/2018   -+ HL; last set of lipids: Lab Results  Component Value Date   CHOL 120 06/07/2019   HDL 36.60 (L) 06/07/2019   LDLCALC 46 06/07/2019   LDLDIRECT 174.0 04/24/2019   TRIG 190.0 (H) 06/07/2019   CHOLHDL 3 06/07/2019  On Crestor 10 daily - last eye exam 03/2018: + DR + history of surgery and IO injections, but she had headaches from these.  She sees Dr. Rodolph Bong. New exam coming up. - she has numbness and tingling in her feet.  Started Neurontin 300 mg at bedtime.  Pt has no FH of DM.  She has a history of hysterectomy in 2001.  She was on LT4 as a teenager >> stopped a long time ago as her TFTs normalized.  Latest TFTs were normal: Lab Results  Component Value Date   TSH 3.85 02/01/2018   TSH 2.54 09/08/2012   TSH 1.52 05/03/2011   TSH 1.93 12/30/2009   TSH 1.95 12/25/2008   ROS: Constitutional: no weight gain/no weight loss, no fatigue, no subjective hyperthermia, no subjective hypothermia Eyes: no blurry vision, no xerophthalmia ENT:  no sore throat, no nodules palpated in neck, no dysphagia, no odynophagia, no hoarseness Cardiovascular: no CP/no SOB/no palpitations/no leg swelling Respiratory: no cough/no SOB/no wheezing Gastrointestinal: no N/no V/no D/no C/no acid reflux Musculoskeletal: no muscle aches/+ joint aches (L foot) Skin: no rashes, no hair loss Neurological: no tremors/+ numbness/+ tingling/no dizziness  I reviewed pt's medications, allergies, PMH, social hx, family hx, and changes were documented in the history of present illness. Otherwise, unchanged from my initial visit note.  Past Medical History:  Diagnosis Date  . Depression   . Diabetes mellitus type II   . Hyperlipemia   . Major depressive disorder, recurrent, in remission  ([place]) 09/01/2006   Qualifier: Diagnosis of  By: Lelon Mast    . Obesity   . Osteopenia   . Syncope 1998   DM diagnosis   Past Surgical History:  Procedure Laterality Date  . ABDOMINAL HYSTERECTOMY  ~2005   and BSO  . BREAST BIOPSY Right 01/05/2019   rt stereo bx 2 areas 1st area ribbon clip  . BREAST BIOPSY Right 01/05/2019   rt stereo bx 2nd area coil clip  . CHOLECYSTECTOMY  1975  . COSMETIC SURGERY     Injury Right face as a child  . VAGINAL DELIVERY     X 2   Social History   Socioeconomic History  . Marital status: Married    Spouse name: Not on file  . Number of children: 2  . Years of education: Not on file  . Highest education level: Not on file  Occupational History    Comment: "Just up and quit" at [employer] Use  . Smoking status: Former Smoker    Quit date: 03/15/1993    Years since quitting: 26.5  . Smokeless tobacco: Never Used  Substance and Sexual Activity  . Alcohol use: No    Alcohol/week: 0.0 standard drinks  . Drug use: No  . Sexual activity: Not on file  Other Topics Concern  . Not on file  Social History Narrative   Has living will   Husband, then daughter [name], have health care POA   Would reluctantly accept resuscitation but no life prolonging measures   Social Determinants of Health   Financial Resource Strain:   . Difficulty of Paying Living Expenses:   Food Insecurity:   . Worried About Charity fundraiser in the Last Year:   . Arboriculturist in the Last Year:   Transportation Needs:   . Film/video editor (Medical):   Marland Kitchen Lack of Transportation (Non-Medical):   Physical Activity:   . Days of Exercise per Week:   . Minutes of Exercise per Session:   Stress:   . Feeling of Stress :   Social Connections:   . Frequency of Communication with Friends and Family:   . Frequency of Social Gatherings with Friends and Family:   . Attends Religious Services:   . Active Member of Clubs or Organizations:   . Attends English as a second language teacher Meetings:   Marland Kitchen Marital Status:   Intimate Partner Violence:   . Fear of Current or Ex-Partner:   . Emotionally Abused:   Marland Kitchen Physically Abused:   . Sexually Abused:    Current Outpatient Medications on File Prior to Visit  Medication Sig Dispense Refill  . Blood Glucose Monitoring Suppl ([place]) w/Device KIT Use to check blood sugar 2 times a day. 1 kit 0  . FLUoxetine (PROZAC) 20 MG capsule Take  1 capsule (20 mg total) by mouth daily. 90 capsule 3  . gabapentin (NEURONTIN) 100 MG capsule TAKE 1 TO 3 CAPSULES BY MOUTH AT BEDTIME (INCREASE  WEEKLY  AS  NEEDED) 90 capsule 5  . glucose blood (ONETOUCH VERIO) test strip Use as instructed to check blood sugar 2 times a day. 200 each 12  . ibuprofen (ADVIL,MOTRIN) 200 MG tablet Take 200 mg by mouth every 6 (six) hours as needed.    . Insulin Syringe-Needle U-100 25G X 5/8" 1 ML MISC by Does not apply route. Use daily as directed (may substitute for insurance)     . Lancets (ONETOUCH ULTRASOFT) lancets Use as instructed to check blood sugar 2 times a day. 200 each 12  . LANTUS 100 UNIT/ML injection INJECT 25 UNITS SUBCUTANEOUSLY ONCE DAILY AT BEDTIME 30 mL 0  . metFORMIN (GLUCOPHAGE) 1000 MG tablet Take 1 tablet by mouth twice daily 180 tablet 0  . naproxen sodium (ALEVE) 220 MG tablet Take 220 mg by mouth.    . rosuvastatin (CRESTOR) 10 MG tablet Take 1 tablet by mouth once daily 90 tablet 1  . venlafaxine XR (EFFEXOR-XR) 150 MG 24 hr capsule Take 1 capsule by mouth twice daily 180 capsule 3  . VICTOZA 18 MG/3ML SOPN INJECT 1.2 MG  SUBCUTANEOUSLY IN THE MORNING 18 mL 2   No current facility-administered medications on file prior to visit.   Allergies  Allergen Reactions  . Onglyza [Saxagliptin] Hives and Nausea And Vomiting  . Liraglutide Nausea Only    Can't eat, nausea, higher sugars   Family History  Problem Relation Age of Onset  . Stroke Father        CVA  . Heart disease Father        MI  .  Cancer Sister        breast cancer  . Breast cancer Sister 13  . Heart disease Brother        MI  . Cancer Brother        Lung    PE: BP 120/88   Pulse 85   Ht '5\' 5"'$  (1.651 m)   Wt 208 lb (94.3 kg)   SpO2 96%   BMI 34.61 kg/m  Wt Readings from Last 3 Encounters:  10/04/19 208 lb (94.3 kg)  06/07/19 202 lb (91.6 kg)  04/24/19 203 lb (92.1 kg)   Constitutional: overweight, in NAD Eyes: PERRLA, EOMI, no exophthalmos ENT: moist mucous membranes, no thyromegaly, no cervical lymphadenopathy Cardiovascular: RRR, No MRG Respiratory: CTA B Gastrointestinal: abdomen soft, NT, ND, BS+ Musculoskeletal: no deformities, strength intact in all 4 Skin: moist, warm, no rashes Neurological: no tremor with outstretched hands, DTR normal in all 4  ASSESSMENT: 1. DM2, insulin-dependent, uncontrolled, with complications - DR - PN - mild CKD  2. HL  3. Obesity class II BMI Classification:  < 18.5 underweight   18.5-24.9 normal weight   25.0-29.9 overweight   30.0-34.9 class I obesity   35.0-39.9 class II obesity   ? 40.0 class III obesity   PLAN:  1. Patient with longstanding, uncontrolled, type 2 diabetes, on oral antidiabetic regimen with Metformin and also daily GLP-1 receptor agonist and long-acting insulin.  HbA1c at last visit improved to 6.6%.  At that time, sugars are better especially in the morning and slightly higher before lunch, after a snack in the morning.  We did not change her regimen at that time but advised her to work on her diet and reduce snacks. -At this  visit, she describes that she ran out of Victoza and she had to increase her Lantus dose to twice a day to compensate for the high blood sugars.  Sugars did improve after increasing the Lantus, but she was stretching the supply since a new refill was very expensive.  She actually started to use her friend's Lantus.  Reviewing her glucometer downloads, her sugars are now at goal in the morning, improved,  however, she is not checking later in the day.  I strongly advised her to start checking later, also. -At this visit, we discussed about switching from Victoza to Ozempic 1 mg weekly.  I also sent a prescription for Lantus to the pharmacy mentioning 25 to 50 units daily just in case she needs to increase the dose again.  We will keep the same dose of Metformin for now. - I suggested to:  Patient Instructions  Please continue: - Metformin 1000 mg 2x a day  Decrease: - Lantus 25 units daily at bedtime  Stop Victoza and start - Ozempic 1 mg weekly  Please return in 3-4 months with your sugar log.   - we checked her HbA1c: 8.3% (higher) - advised to check sugars at different times of the day - 1x a day, rotating check times - advised for yearly eye exams >> she is not UTD but has an appointment scheduled - return to clinic in 4 months  2. HL -Reviewed latest lipid panel from 05/2019: LDL much improved, triglycerides slightly high, HDL slightly low Lab Results  Component Value Date   CHOL 120 06/07/2019   HDL 36.60 (L) 06/07/2019   LDLCALC 46 06/07/2019   LDLDIRECT 174.0 04/24/2019   TRIG 190.0 (H) 06/07/2019   CHOLHDL 3 06/07/2019  -She had nausea with atorvastatin and rosuvastatin but tolerated Crestor once a week well.  We increased the frequency to daily.  She is tolerating the dose well.  3. Obesity class II -Continue GLP-1 receptor agonist, which should also help with weight loss >>  Will switch to Ozempic, which is stronger than Victoza -She lost 3 pounds before last visit and gained 6 lbs since then  Philemon Kingdom, MD PhD [facility] Endocrinology

## 2019-10-04 NOTE — Patient Instructions (Signed)
Please continue: - Metformin 1000 mg 2x a day  Decrease: - Lantus 25 units daily at bedtime  Stop Victoza and start - Ozempic 1 mg weekly  Please return in 3-4 months with your sugar log.

## 2019-10-17 ENCOUNTER — Other Ambulatory Visit: Payer: Self-pay | Admitting: Internal Medicine

## 2020-01-22 ENCOUNTER — Other Ambulatory Visit: Payer: Self-pay | Admitting: Internal Medicine

## 2020-01-25 ENCOUNTER — Telehealth: Payer: Self-pay | Admitting: Internal Medicine

## 2020-01-25 NOTE — Chronic Care Management (AMB) (Signed)
  Chronic Care Management   Note  01/25/2020 Name: Jacqueline Cole MRN: [id number] DOB: 1950/10/24  Jacqueline Cole is a 69 y.o. year old female who is a primary care patient of Venia Carbon, MD. I reached out to Jacqueline Cole by phone today in response to a referral sent by Ms. Bellamarie T Furuya's PCP, Venia Carbon, MD.   Ms. Farler was given information about Chronic Care Management services today including:  1. CCM service includes personalized support from designated clinical staff supervised by her physician, including individualized plan of care and coordination with other care providers 2. 24/7 contact phone numbers for assistance for urgent and routine care needs. 3. Service will only be billed when office clinical staff spend 20 minutes or more in a month to coordinate care. 4. Only one practitioner may furnish and bill the service in a calendar month. 5. The patient may stop CCM services at any time (effective at the end of the month) by phone call to the office staff.   Patient agreed to services and verbal consent obtained.   Follow up plan:   [place]

## 2020-02-04 ENCOUNTER — Ambulatory Visit: Payer: PPO | Admitting: Internal Medicine

## 2020-02-04 ENCOUNTER — Encounter: Payer: Self-pay | Admitting: Internal Medicine

## 2020-02-04 ENCOUNTER — Other Ambulatory Visit: Payer: Self-pay

## 2020-02-04 VITALS — BP 130/82 | HR 80 | Ht 65.0 in | Wt 198.2 lb

## 2020-02-04 DIAGNOSIS — E1165 Type 2 diabetes mellitus with hyperglycemia: Secondary | ICD-10-CM | POA: Diagnosis not present

## 2020-02-04 DIAGNOSIS — E669 Obesity, unspecified: Secondary | ICD-10-CM

## 2020-02-04 DIAGNOSIS — IMO0002 Reserved for concepts with insufficient information to code with codable children: Secondary | ICD-10-CM

## 2020-02-04 DIAGNOSIS — E66812 Obesity, class 2: Secondary | ICD-10-CM

## 2020-02-04 DIAGNOSIS — E785 Hyperlipidemia, unspecified: Secondary | ICD-10-CM

## 2020-02-04 DIAGNOSIS — E1149 Type 2 diabetes mellitus with other diabetic neurological complication: Secondary | ICD-10-CM | POA: Diagnosis not present

## 2020-02-04 LAB — POCT GLYCOSYLATED HEMOGLOBIN (HGB A1C): Hemoglobin A1C: 6 % — AB (ref 4.0–5.6)

## 2020-02-04 NOTE — Progress Notes (Signed)
Patient ID: INFANTOF VILLAGOMEZ, female   DOB: 10-27-1950, 69 y.o.   MRN: [id number]   This visit occurred during the SARS-CoV-2 public health emergency.  Safety protocols were in place, including screening questions prior to the visit, additional usage of staff PPE, and extensive cleaning of exam room while observing appropriate contact time as indicated for disinfecting solutions.   HPI: ALLYSA GOVERNALE is a 69 y.o.-year-old female, returning for follow-up for DM2, dx in 1997, insulin-dependent since 2000, uncontrolled, with complications (diabetic retinopathy, mild CKD, peripheral neuropathy).  Last visit 4 months ago. She will see Dr. Andree Elk after Dr. Reatha Harps.  Reviewed HbA1c levels: Lab Results  Component Value Date   HGBA1C 8.3 (A) 10/04/2019   HGBA1C 6.6 (A) 06/07/2019   HGBA1C 7.6 (A) 02/06/2019   Pt is on a regimen of: - Metformin 1000 mg 2x a day, with meals - Lantus 60 units 2x a day >> ... >> 30 units at lunch >> 25 units 2x a day >> 25 units daily - Victoza 0.6 mg daily - started 07/2017 >> increased to 0.6 mg daily + 3 clicks >> 1.2 >> 1.8 mg daily >> Ozempic 1 mg weekly She was previously on Onglyza-developed facial rash on left side of face.  Pt checks her sugars once a day: - am: 87-150, 177, 197 >> 75-125 >> 84-122, 213, 216 >> 69-122, 133 - 2h after b'fast: 413 >> n/c >> 73-147, 168 >> n/c - before lunch: 87-106 >> n/c >> 140s, 200 >> n/c - 2h after lunch: 377, 420, 500  >> n/c >> 164, 277 >> n/c - before dinner: 92, 156-176, 220 >> 77, 147 >> n/c - 2h after dinner: 391 >> n/c >> 204 >> n/c >> 120-160, 200 x1 - bedtime: n/c - nighttime: n/c Lowest sugar was 75 >> 84 >> 69; she has hypoglycemia awareness in the 80s. Highest sugar was 197 >> 200 >> 216 >> 200.  Glucometer: One Touch ultra >> Accu-Chek  Pt's meals are: - Breakfast: eggs, , sliced tomatoes >> toast, Propel water; rice crispies - Lunch: sandwich, pretzels >> half a sandwich + half an apple - Dinner:  meat + 2 veggies - Snacks: fruit, almonds  -+ Mild CKD, last BUN/creatinine:  Lab Results  Component Value Date   BUN 16 04/24/2019   BUN 9 02/15/2018   CREATININE 1.10 04/24/2019   CREATININE 1.13 02/15/2018   -+ HL; last set of lipids: Lab Results  Component Value Date   CHOL 120 06/07/2019   HDL 36.60 (L) 06/07/2019   LDLCALC 46 06/07/2019   LDLDIRECT 174.0 04/24/2019   TRIG 190.0 (H) 06/07/2019   CHOLHDL 3 06/07/2019  On Crestor 10 daily. - last eye exam 2021: + DR reportedly, + cataracts, + history of surgery and IO injections, but she had headaches from these.  She sees Dr. Rodolph Bong.  - + numbness and tingling and also in her feet.  Started Neurontin 300 mg at bedtime. On B12.  Pt has no FH of DM.  She has a history of hysterectomy in 2001.  She was on LT4 as a teenager >> stopped a long time ago as her TFTs normalized.  Latest TFTs were reviewed and these were normal. Lab Results  Component Value Date   TSH 3.85 02/01/2018   TSH 2.54 09/08/2012   TSH 1.52 05/03/2011   TSH 1.93 12/30/2009   TSH 1.95 12/25/2008   ROS: Constitutional: no weight gain/+ weight loss, no fatigue, no subjective hyperthermia, no  subjective hypothermia Eyes: no blurry vision, no xerophthalmia ENT: no sore throat, no nodules palpated in neck, no dysphagia, no odynophagia, no hoarseness Cardiovascular: no CP/no SOB/no palpitations/no leg swelling Respiratory: no cough/no SOB/no wheezing Gastrointestinal: no N/no V/no D/no C/no acid reflux Musculoskeletal: no muscle aches/+ joint aches Skin: no rashes, no hair loss Neurological: no tremors/+ numbness/+ tingling/no dizziness  I reviewed pt's medications, allergies, PMH, social hx, family hx, and changes were documented in the history of present illness. Otherwise, unchanged from my initial visit note.  Past Medical History:  Diagnosis Date  . Depression   . Diabetes mellitus type II   . Hyperlipemia   . Major depressive disorder,  recurrent, in remission ([place]) 09/01/2006   Qualifier: Diagnosis of  By: Lelon Mast    . Obesity   . Osteopenia   . Syncope 1998   DM diagnosis   Past Surgical History:  Procedure Laterality Date  . ABDOMINAL HYSTERECTOMY  ~2005   and BSO  . BREAST BIOPSY Right 01/05/2019   rt stereo bx 2 areas 1st area ribbon clip  . BREAST BIOPSY Right 01/05/2019   rt stereo bx 2nd area coil clip  . CHOLECYSTECTOMY  1975  . COSMETIC SURGERY     Injury Right face as a child  . VAGINAL DELIVERY     X 2   Social History   Socioeconomic History  . Marital status: Married    Spouse name: Not on file  . Number of children: 2  . Years of education: Not on file  . Highest education level: Not on file  Occupational History    Comment: "Just up and quit" at [employer] Use  . Smoking status: Former Smoker    Quit date: 03/15/1993    Years since quitting: 26.9  . Smokeless tobacco: Never Used  Substance and Sexual Activity  . Alcohol use: No    Alcohol/week: 0.0 standard drinks  . Drug use: No  . Sexual activity: Not on file  Other Topics Concern  . Not on file  Social History Narrative   Has living will   Husband, then daughter [name], have health care POA   Would reluctantly accept resuscitation but no life prolonging measures   Social Determinants of Health   Financial Resource Strain:   . Difficulty of Paying Living Expenses: Not on file  Food Insecurity:   . Worried About Charity fundraiser in the Last Year: Not on file  . Ran Out of Food in the Last Year: Not on file  Transportation Needs:   . Lack of Transportation (Medical): Not on file  . Lack of Transportation (Non-Medical): Not on file  Physical Activity:   . Days of Exercise per Week: Not on file  . Minutes of Exercise per Session: Not on file  Stress:   . Feeling of Stress : Not on file  Social Connections:   . Frequency of Communication with Friends and Family: Not on file  . Frequency of Social Gatherings  with Friends and Family: Not on file  . Attends Religious Services: Not on file  . Active Member of Clubs or Organizations: Not on file  . Attends Archivist Meetings: Not on file  . Marital Status: Not on file  Intimate Partner Violence:   . Fear of Current or Ex-Partner: Not on file  . Emotionally Abused: Not on file  . Physically Abused: Not on file  . Sexually Abused: Not on file   Current Outpatient  Medications on File Prior to Visit  Medication Sig Dispense Refill  . Blood Glucose Monitoring Suppl ([place]) w/Device KIT Use to check blood sugar 2 times a day. 1 kit 0  . FLUoxetine (PROZAC) 20 MG capsule Take 1 capsule (20 mg total) by mouth daily. 90 capsule 3  . gabapentin (NEURONTIN) 100 MG capsule TAKE 1 TO 3 CAPSULES BY MOUTH AT BEDTIME (INCREASE  WEEKLY  AS  NEEDED) 90 capsule 5  . glucose blood (ONETOUCH VERIO) test strip Use as instructed to check blood sugar 2 times a day. 200 each 12  . ibuprofen (ADVIL,MOTRIN) 200 MG tablet Take 200 mg by mouth every 6 (six) hours as needed.    . Insulin Syringe-Needle U-100 25G X 5/8" 1 ML MISC by Does not apply route. Use daily as directed (may substitute for insurance)     . Lancets (ONETOUCH ULTRASOFT) lancets Use as instructed to check blood sugar 2 times a day. 200 each 12  . LANTUS 100 UNIT/ML injection INJECT 25-50 UNITS SUBCUTANEOUSLY ONCE DAILY AT BEDTIME 30 mL 3  . metFORMIN (GLUCOPHAGE) 1000 MG tablet Take 1 tablet by mouth twice daily 180 tablet 0  . naproxen sodium (ALEVE) 220 MG tablet Take 220 mg by mouth.    . rosuvastatin (CRESTOR) 10 MG tablet Take 1 tablet by mouth once daily 90 tablet 0  . Semaglutide, 1 MG/DOSE, (OZEMPIC, 1 MG/DOSE,) 4 MG/3ML SOPN Inject 0.75 mLs (1 mg total) into the skin once a week. 9 mL 3  . venlafaxine XR (EFFEXOR-XR) 150 MG 24 hr capsule Take 1 capsule by mouth twice daily 180 capsule 3   No current facility-administered medications on file prior to visit.    Allergies  Allergen Reactions  . Onglyza [Saxagliptin] Hives and Nausea And Vomiting  . Liraglutide Nausea Only    Can't eat, nausea, higher sugars   Family History  Problem Relation Age of Onset  . Stroke Father        CVA  . Heart disease Father        MI  . Cancer Sister        breast cancer  . Breast cancer Sister 27  . Heart disease Brother        MI  . Cancer Brother        Lung    PE: BP 130/82   Pulse 80   Ht $R'5\' 5"'jS$  (1.651 m)   Wt 198 lb 3.2 oz (89.9 kg)   SpO2 97%   BMI 32.98 kg/m  Wt Readings from Last 3 Encounters:  02/04/20 198 lb 3.2 oz (89.9 kg)  10/04/19 208 lb (94.3 kg)  06/07/19 202 lb (91.6 kg)   Constitutional: overweight, in NAD Eyes: PERRLA, EOMI, no exophthalmos ENT: moist mucous membranes, no thyromegaly, no cervical lymphadenopathy Cardiovascular: RRR, No MRG Respiratory: CTA B Gastrointestinal: abdomen soft, NT, ND, BS+ Musculoskeletal: no deformities, strength intact in all 4 Skin: moist, warm, no rashes Neurological: no tremor with outstretched hands, DTR normal in all 4  ASSESSMENT: 1. DM2, insulin-dependent, uncontrolled, with complications - DR - PN - mild CKD  2. HL  3. Obesity class II BMI Classification:  < 18.5 underweight   18.5-24.9 normal weight   25.0-29.9 overweight   30.0-34.9 class I obesity   35.0-39.9 class II obesity   ? 40.0 class III obesity   PLAN:  1. Patient with longstanding, uncontrolled, type 2 diabetes, on oral antidiabetic regimen with Metformin, long-acting insulin, and also weekly GLP-1  receptor agonist, changed daily GLP-1 receptor agonist at last visit.  At that time, sugars were higher after running out of Victoza and she had to increase her Lantus dose to twice a day to compensate for the high blood sugars.  Sugars did improve afterwards but she was changing the supply of her insulin and started to use her friends Lantus.  Sugars were at goal in the morning but she was not checking later  in the day.  I advised her to start checking at different times of the day.  We also switched from Victoza to [place].  We reduced the dose of Lantus to 25 units daily. -At this visit, sugars are at goal, with 2 exceptions: a slightly low CBG at 69 and a slightly high CBG at 133 before breakfast.  She is not usually checking later in the day.  She only had one high blood sugar at 200 after dinner.  The rest of her blood sugars are not well, whenever she is checking them. -We will not change her regimen today -She does complain of pain occasionally from neuropathy and I recommended to start on valproic acid.  She is already using Aspercreme, takes Neurontin, and also B12. - I suggested to:  Patient Instructions  Please continue: - Metformin 1000 mg 2x a day - Lantus 25 units daily at bedtime - Ozempic 1 mg weekly  Please start: - Alpha Lipoic Acid 600 mg 2x a day  Please return in 4 months with your sugar log.   - we checked her HbA1c: 6.0% (MUCH better) - advised to check sugars at different times of the day - 1x a day, rotating check times - advised for yearly eye exams >> she is UTD - return to clinic in 4 months  2. HL -Reviewed latest lipid panel from 05/2019: LDL much improved, triglycerides slightly high, HDL slightly low Lab Results  Component Value Date   CHOL 120 06/07/2019   HDL 36.60 (L) 06/07/2019   LDLCALC 46 06/07/2019   LDLDIRECT 174.0 04/24/2019   TRIG 190.0 (H) 06/07/2019   CHOLHDL 3 06/07/2019  -She was on atorvastatin and rosuvastatin in the past but had nausea with these.  We started Crestor once a week and we moved this daily.  She is now tolerating this well.  3. Obesity class II -At last visit we switched from Victoza to [place], which is stronger and conducive to more weight loss. -She gained 6 pounds before last visit and 10 more since then!!  Philemon Kingdom, MD PhD [facility] Endocrinology

## 2020-02-04 NOTE — Patient Instructions (Addendum)
Please continue: - Metformin 1000 mg 2x a day - Lantus 25 units daily at bedtime - Ozempic 1 mg weekly  Please start: - Alpha Lipoic Acid 600 mg 2x a day  Please return in 4 months with your sugar log.

## 2020-02-04 NOTE — Addendum Note (Signed)
Addended by: Lauralyn Primes on: 02/04/2020 10:08 AM   Modules accepted: Orders

## 2020-02-18 ENCOUNTER — Other Ambulatory Visit: Payer: Self-pay | Admitting: Internal Medicine

## 2020-02-23 ENCOUNTER — Other Ambulatory Visit: Payer: Self-pay | Admitting: Internal Medicine

## 2020-03-18 ENCOUNTER — Other Ambulatory Visit: Payer: Self-pay | Admitting: Internal Medicine

## 2020-03-19 ENCOUNTER — Telehealth: Payer: Self-pay

## 2020-03-19 NOTE — Chronic Care Management (AMB) (Addendum)
° ° °  Chronic Care Management Pharmacy Assistant   Name: SHARREN SCHNURR  MRN: [id number] DOB: [birth year]  Reason for Encounter: Initial Questions for CCM visit 03/20/2020  PCP : Venia Carbon, MD  Allergies:   Allergies  Allergen Reactions   Onglyza [Saxagliptin] Hives and Nausea And Vomiting   Liraglutide Nausea Only    Can't eat, nausea, higher sugars    Medications: Outpatient Encounter Medications as of 03/19/2020  Medication Sig   Blood Glucose Monitoring Suppl ([place]) w/Device KIT Use to check blood sugar 2 times a day.   FLUoxetine (PROZAC) 20 MG capsule Take 1 capsule (20 mg total) by mouth daily.   gabapentin (NEURONTIN) 100 MG capsule TAKE 1 TO 3 CAPSULES BY MOUTH AT BEDTIME (INCREASE  WEEKLY  AS  NEEDED)   glucose blood (ONETOUCH VERIO) test strip Use as instructed to check blood sugar 2 times a day.   ibuprofen (ADVIL,MOTRIN) 200 MG tablet Take 200 mg by mouth every 6 (six) hours as needed.   Insulin Syringe-Needle U-100 25G X 5/8" 1 ML MISC by Does not apply route. Use daily as directed (may substitute for insurance)    Lancets (ONETOUCH ULTRASOFT) lancets Use as instructed to check blood sugar 2 times a day.   LANTUS 100 UNIT/ML injection INJECT 25-50 UNITS SUBCUTANEOUSLY ONCE DAILY AT BEDTIME   metFORMIN (GLUCOPHAGE) 1000 MG tablet Take 1 tablet by mouth twice daily   naproxen sodium (ALEVE) 220 MG tablet Take 220 mg by mouth.   rosuvastatin (CRESTOR) 10 MG tablet Take 1 tablet by mouth once daily   Semaglutide, 1 MG/DOSE, (OZEMPIC, 1 MG/DOSE,) 4 MG/3ML SOPN Inject 0.75 mLs (1 mg total) into the skin once a week.   venlafaxine XR (EFFEXOR-XR) 150 MG 24 hr capsule Take 1 capsule by mouth twice daily   No facility-administered encounter medications on file as of 03/19/2020.    Current Diagnosis: Patient Active Problem List   Diagnosis Date Noted   Multiple fibroadenomata of both breasts 04/24/2019   MDD (major depressive disorder), recurrent  episode, moderate ([place]) 08/09/2017   Osteopenia    Osteoarthritis of right knee 12/13/2016   Advance directive discussed with patient 12/13/2016   Type 2 diabetes mellitus with neurological manifestations, uncontrolled ([place]) 02/15/2012   Routine general medical examination at a health care facility 05/03/2011   Class II obesity 05/03/2011   Hyperlipemia 09/01/2006   Have you seen any other providers since your last visit? Yes  02/04/20 [facility]   10/04/19 [facility]- [facility]  Any changes in your medications or health? Yes  7/22/ 21 Dr. Cruzita Lederer added Ozempic and increased Lantus from 25 units at bedtime to 25-50 units at bedtime.   Left message to remind patient to have all medications and supplements available for review with Debbora Dus, PharmD, at their telephone visit on 03/20/20  Follow-Up:  Pharmacist Review   Debbora Dus, CPP notified  Margaretmary Dys, [place] Pharmacy Assistant [phone]

## 2020-03-20 ENCOUNTER — Other Ambulatory Visit: Payer: Self-pay

## 2020-03-20 ENCOUNTER — Ambulatory Visit: Payer: PPO

## 2020-03-20 DIAGNOSIS — IMO0002 Reserved for concepts with insufficient information to code with codable children: Secondary | ICD-10-CM

## 2020-03-20 DIAGNOSIS — E1165 Type 2 diabetes mellitus with hyperglycemia: Secondary | ICD-10-CM

## 2020-03-20 DIAGNOSIS — E785 Hyperlipidemia, unspecified: Secondary | ICD-10-CM

## 2020-03-20 NOTE — Chronic Care Management (AMB) (Signed)
Chronic Care Management Pharmacy  Name: Jacqueline Cole  MRN: [id number] DOB: [birth year]  Chief Complaint/ HPI  Jacqueline Cole,  70 y.o. , female presents for their Initial CCM visit with the clinical pharmacist via telephone.  PCP : Venia Carbon, MD  Their chronic conditions include: DM, HLD, HTN  Patient concerns: Reports head sweats with fluoxetine, resolved once she stopped it 1 year ago. Reports mood is stable with just venlafaxine.   Office Visits: 04/24/19: Silvio Pate - Please restart the fluoxetine.Try the gabapentin at night for the foot nerve pain. Start with 1 capsule and increase every week up to 3 capsules if needed. Let me know if that doesn't work. You should stop the PM med and ibuprofen.  Consult Visit 02/04/20: Endo - cont current meds, recommend alpha lipoic acid, rtc 4 months  Medications: Outpatient Encounter Medications as of 03/20/2020  Medication Sig  . ibuprofen (ADVIL,MOTRIN) 200 MG tablet Take 200 mg by mouth every 6 (six) hours as needed.  . rosuvastatin (CRESTOR) 10 MG tablet Take 1 tablet by mouth once daily  . Semaglutide, 1 MG/DOSE, (OZEMPIC, 1 MG/DOSE,) 4 MG/3ML SOPN Inject 0.75 mLs (1 mg total) into the skin once a week.  . venlafaxine XR (EFFEXOR-XR) 150 MG 24 hr capsule Take 1 capsule by mouth twice daily  . [DISCONTINUED] gabapentin (NEURONTIN) 100 MG capsule TAKE 1 TO 3 CAPSULES BY MOUTH AT BEDTIME (INCREASE  WEEKLY  AS  NEEDED)  . [DISCONTINUED] glucose blood (ONETOUCH VERIO) test strip Use as instructed to check blood sugar 2 times a day.  . [DISCONTINUED] metFORMIN (GLUCOPHAGE) 1000 MG tablet Take 1 tablet by mouth twice daily  . Blood Glucose Monitoring Suppl ([place]) w/Device KIT Use to check blood sugar 2 times a day.  Marland Kitchen LANTUS 100 UNIT/ML injection INJECT 25-50 UNITS SUBCUTANEOUSLY ONCE DAILY AT BEDTIME  . naproxen sodium (ALEVE) 220 MG tablet Take 220 mg by mouth.  . [DISCONTINUED] FLUoxetine (PROZAC) 20 MG capsule Take  1 capsule (20 mg total) by mouth daily.  . [DISCONTINUED] Insulin Syringe-Needle U-100 25G X 5/8" 1 ML MISC by Does not apply route. Use daily as directed (may substitute for insurance)   . [DISCONTINUED] Lancets (ONETOUCH ULTRASOFT) lancets Use as instructed to check blood sugar 2 times a day.   No facility-administered encounter medications on file as of 03/20/2020.    Current Diagnosis/Assessment: SDOH Interventions   Flowsheet Row Most Recent Value  SDOH Interventions   Financial Strain Interventions Intervention Not Indicated  [Medications affordable]     Goals Addressed            This Visit's Progress   . Pharmacy Care Plan       CARE PLAN ENTRY (see longitudinal plan of care for additional care plan information)  Current Barriers:  . Chronic Disease Management support, education, and care coordination needs related to Diabetes and Neuropathy   Diabetes Lab Results  Component Value Date/Time   HGBA1C 6.0 (A) 02/04/2020 10:08 AM   HGBA1C 8.3 (A) 10/04/2019 09:52 AM   HGBA1C 8.2 (H) 10/13/2016 12:13 PM   HGBA1C 10.5 (H) 05/26/2016 08:56 AM   . Pharmacist Clinical Goal(s): o Over the next 3 months, patient will work with PharmD and providers to maintain A1c goal <7% . Current regimen:   Lantus 25 units daily  Metformin 1000 mg twice daily   Ozempic 1 mg weekly (Mondays) Interventions: o Reviewed home blood glucose log (average BG 95), no low blood glucose readings <  70 . Patient self care activities - Over the next 3 months, patient will: o Check blood sugar twice daily, document, and provide at future appointments o Contact provider with any episodes of hypoglycemia  Neuropathy  . Pharmacist Clinical Goal(s) o Over the next 30 days, patient will work with PharmD and providers to improve nerve pain . Current regimen:   Gabapentin 100 mg - 3 tablets at bedtime  Aspercreme - applies to feet every night  B12 daily   Alpha lipoic acid - 200 mg three times  daily  Vinegar soaks at bedtime  . Interventions: o Consult PCP to consider gabapentin dose increase o Increase OTC alpha lipoic acid to 600 mg twice daily  . Patient self care activities - Over the next 30 days, patient will: o Continue medications as prescribed until further contact from pharmacist  Medication management . Pharmacist Clinical Goal(s): o Over the next 3 months, patient will work with PharmD and providers to maintain optimal medication adherence . Current pharmacy: [facility] . Interventions o Comprehensive medication review performed. o Utilize [facility] pharmacy for medication synchronization, packaging and delivery . Patient self care activities - Over the next 3 months, patient will: o Take medications as prescribed o Report any questions or concerns to PharmD and/or provider(s)  Additional Recommendations: . Recommend switching Benadryl to Allegra or Claritin for watery eyes. . Try Tylenol instead of Advil for headaches.   Initial goal documentation      Hyperlipidemia   LDL goal < 70  Last lipids Lab Results  Component Value Date   CHOL 120 06/07/2019   HDL 36.60 (L) 06/07/2019   LDLCALC 46 06/07/2019   LDLDIRECT 174.0 04/24/2019   TRIG 190.0 (H) 06/07/2019   CHOLHDL 3 06/07/2019   Hepatic Function Latest Ref Rng & Units 04/24/2019 02/15/2018 12/13/2016  Total Protein 6.0 - 8.3 g/dL 6.9 7.1 6.7  Albumin 3.5 - 5.2 g/dL 4.3 4.4 4.1  AST 0 - 37 U/L _0 ALT 0 - 35 U/L _1 Alk Phosphatase 39 - 117 U/L 74 85 85  Total Bilirubin 0.2 - 1.2 mg/dL 0.9 1.1 0.7  Bilirubin, Direct 0.0 - 0.3 mg/dL - - -     The ASCVD Risk score (Streeter., et al., 2013) failed to calculate for the following reasons:   The valid total cholesterol range is 130 to 320 mg/dL   Patient has failed these meds in past: none  Patient is currently controlled on the following medications:  . Rosuvastatin 10 mg - 1 daily  We discussed:  Statin refills timely, LDL  within goal  Plan: Continue current medications  Diabetes   Recent Relevant Labs: Lab Results  Component Value Date/Time   HGBA1C 6.0 (A) 02/04/2020 10:08 AM   HGBA1C 8.3 (A) 10/04/2019 09:52 AM   HGBA1C 8.2 (H) 10/13/2016 12:13 PM   HGBA1C 10.5 (H) 05/26/2016 08:56 AM   MICROALBUR 4.0 (H) 11/26/2015 09:58 AM   MICROALBUR 0.8 09/08/2012 09:36 AM    Checking BG: 2x per Day  Recent FBG Readings: 95 (8:30 AM before breakfast) Recent HS BG readings: 95 (8 PM) eats supper at 5 :30 PM Reports BG is very stable, always right around 95 morning and night. Denies any s/s of hypoglycemia  Patient has failed these meds in past: Victoza, Onlgyza Patient is currently controlled on the following medications:   Lantus 25 units daily  Metformin 1000 mg BID  Ozempic 1 mg weekly (Mondays)  Lab Results  Component Value Date/Time   HMDIABEYEEXA Retinopathy (A) 04/25/2017 12:00 AM    Lab Results  Component Value Date/Time   HMDIABFOOTEX done 04/24/2019 12:00 AM    We discussed: A1c at goal, no hypoglycemia, continue current therapy. Consider decreasing insulin dose at next visit if home BG remains around 95.  Plan: Continue current medications   Neuropathy   Waking up at night due to neuropathy, shooting pain going up legs Patient has failed these meds in past: none reported Patient is currently uncontrolled on the following medications:   Gabapentin 100 mg - 3 tablets at bedtime  Aspercreme - applies to feet every night  Vitamin B12 daily   Alpha lipoic acid - 200 mg TID  Vinegar soaks at bedtime   We discussed:  Pt reports gabapentin worked wonders at first but now minimal efficacy. Denies next day drowsiness. Interested in gabapentin dose increase. Pt started alpha lipoic acid per endocrinology last visit but was only able to find 200 mg dose. She is taking 200 mg TID. Endo recommended 600 mg BID. Discussed [facility] pharmacy can order alpha lipoic acid at 600 mg dose for her.  She is interested.   Plan: Consult PCP to increase gabapentin dose.  Osteopenia   Last DEXA Scan: 2018  [place] Neck Right is 0.699 g/cm2 with a T-score of -2.4. 10-year Probability of Fracture Major Osteoporotic Fracture  11.7% 10-year Probability of Hip Fracture 2.3%  No results found for: VD25OH   Patient is not a candidate for pharmacologic treatment  Patient has failed these meds in past: none reported Patient is currently on the following medications:  Marland Kitchen Vitamin D 1000 IU - daily . No calcium  We discussed:  Recommend [phone] units of vitamin D daily. Recommend 1200 mg of calcium daily from dietary and supplemental sources.  Plan: Continue current medications; Recommend vitamin D level   Depression   Patient has failed these meds in past: Fluoxetine - head sweats  Patient is currently controlled on the following medications:   Venlafaxine XR 150 mg - 1 daily  We discussed: Patient reports mood is well controlled, avoids missed doses. Doing well despite self-discontinuing fluoxetine this time last year due to intolerable head sweating. Per refill history filled fluoxetine 12/2019 - ensured patient is no longer taking.  Plan: Continue current medications  Medication Management   OTCs:  Biotin 2500 mcg daily  B12 500 mcg daily  Benadryl every night for watery eyes  Advil - takes 2 PRN sinus headaches   Recommend switching Benadryl to Allegra or Claritin.  Try Tylenol instead of Advil for headaches due to kidney function.  Patient's preferred pharmacy is: [facility]  We discussed: Discussed benefits of medication synchronization, packaging and delivery as well as enhanced pharmacist oversight with [facility]. Pt reports she visits [facility] about 6 times a month and would like her meds synchronized.  Plan  Utilize [facility] pharmacy for medication synchronization, packaging and delivery  Follow up:  3 month phone visit  Debbora Dus, PharmD Clinical  Pharmacist [facility] at [facility] [phone]

## 2020-03-26 ENCOUNTER — Telehealth: Payer: Self-pay

## 2020-03-26 NOTE — Chronic Care Management (AMB) (Addendum)
° ° °  Chronic Care Management Pharmacy Assistant   Name: Jacqueline Cole  MRN: [id number] DOB: Aug 12, 1950  Reason for Encounter: Coordination of Medications, Med Sync, and Delivery  PCP : Venia Carbon, MD  Allergies:   Allergies  Allergen Reactions   Onglyza [Saxagliptin] Hives and Nausea And Vomiting   Liraglutide Nausea Only    Can't eat, nausea, higher sugars    Medications: Outpatient Encounter Medications as of 03/26/2020  Medication Sig   Blood Glucose Monitoring Suppl (ONETOUCH VERIO FLEX SYSTEM) w/Device KIT Use to check blood sugar 2 times a day.   gabapentin (NEURONTIN) 100 MG capsule TAKE 1 TO 3 CAPSULES BY MOUTH AT BEDTIME (INCREASE  WEEKLY  AS  NEEDED)   glucose blood (ONETOUCH VERIO) test strip Use as instructed to check blood sugar 2 times a day.   ibuprofen (ADVIL,MOTRIN) 200 MG tablet Take 200 mg by mouth every 6 (six) hours as needed.   Insulin Syringe-Needle U-100 25G X 5/8" 1 ML MISC by Does not apply route. Use daily as directed (may substitute for insurance)    Lancets (ONETOUCH ULTRASOFT) lancets Use as instructed to check blood sugar 2 times a day.   LANTUS 100 UNIT/ML injection INJECT 25-50 UNITS SUBCUTANEOUSLY ONCE DAILY AT BEDTIME   metFORMIN (GLUCOPHAGE) 1000 MG tablet Take 1 tablet by mouth twice daily   naproxen sodium (ALEVE) 220 MG tablet Take 220 mg by mouth.   rosuvastatin (CRESTOR) 10 MG tablet Take 1 tablet by mouth once daily   Semaglutide, 1 MG/DOSE, (OZEMPIC, 1 MG/DOSE,) 4 MG/3ML SOPN Inject 0.75 mLs (1 mg total) into the skin once a week.   venlafaxine XR (EFFEXOR-XR) 150 MG 24 hr capsule Take 1 capsule by mouth twice daily   No facility-administered encounter medications on file as of 03/26/2020.   Current Diagnosis: Patient Active Problem List   Diagnosis Date Noted   Multiple fibroadenomata of both breasts 04/24/2019   MDD (major depressive disorder), recurrent episode, moderate ([place]) 08/09/2017   Osteopenia    Osteoarthritis of  right knee 12/13/2016   Advance directive discussed with patient 12/13/2016   Type 2 diabetes mellitus with neurological manifestations, uncontrolled ([place]) 02/15/2012   Routine general medical examination at a health care facility 05/03/2011   Class II obesity 05/03/2011   Hyperlipemia 09/01/2006   Reviewed quantity on hand of medications to create med sync plan. Patient requested patient assistance applications be completed for Ozempic and Lantus. Forms have been completed and are awaiting patient signature. Patient will need to take forms to Dr. Cruzita Lederer for her to sign.   Follow-Up:  Comptroller, Patient Assistance Coordination and Pharmacist Review   Debbora Dus, CPP notified  Margaretmary Dys, [place] Assistant [phone]  I have reviewed the care management and care coordination activities outlined in this encounter and I am certifying that I agree with the content of this note. No further action required.  Debbora Dus, PharmD Clinical Pharmacist [facility] at [facility] [phone]

## 2020-04-01 ENCOUNTER — Telehealth: Payer: Self-pay | Admitting: Internal Medicine

## 2020-04-01 ENCOUNTER — Telehealth: Payer: Self-pay

## 2020-04-01 DIAGNOSIS — E1165 Type 2 diabetes mellitus with hyperglycemia: Secondary | ICD-10-CM

## 2020-04-01 DIAGNOSIS — IMO0002 Reserved for concepts with insufficient information to code with codable children: Secondary | ICD-10-CM

## 2020-04-01 NOTE — Telephone Encounter (Signed)
-----   Message from [place], [facility] sent at 04/01/2020 12:56 PM EST ----- Regarding: Refill Request Attempted to transfer gabapentin from [place] to coordinate patient medications through [facility]. They report there are no refills on file.   Would you mind sending a new prescription for gabapentin to [facility]?  Thank you,  Debbora Dus, PharmD Clinical Pharmacist [facility] at [facility] [phone]

## 2020-04-01 NOTE — Telephone Encounter (Signed)
Jacqueline Cole with [facility] called to request all RX's from Dr. Cruzita Lederer be sent to [facility] from [facility] requests the following  RX's:  metFORMIN (GLUCOPHAGE) 1000 MG tablet glucose blood (ONETOUCH VERIO) test strip  Lancets (ONETOUCH ULTRASOFT) lancets  AND Insulin Syringe-Needle U-100 25G X 5/8" 1 ML MISC  Be sent to [facility] Fax# [phone]

## 2020-04-02 MED ORDER — GABAPENTIN 300 MG PO CAPS: 600.0000 mg | ORAL_CAPSULE | Freq: Every day | ORAL | 3 refills | Status: DC

## 2020-04-02 MED ORDER — "INSULIN SYRINGE-NEEDLE U-100 25G X 5/8"" 1 ML MISC": 2 refills | Status: DC

## 2020-04-02 MED ORDER — ONETOUCH VERIO VI STRP: ORAL_STRIP | 12 refills | Status: DC

## 2020-04-02 MED ORDER — ONETOUCH ULTRASOFT LANCETS MISC: 12 refills | Status: AC

## 2020-04-02 MED ORDER — METFORMIN HCL 1000 MG PO TABS: 1000.0000 mg | ORAL_TABLET | Freq: Two times a day (BID) | ORAL | 0 refills | Status: DC

## 2020-04-02 NOTE — Telephone Encounter (Signed)
Rx have been sent to pt's new pharmacy.

## 2020-04-02 NOTE — Telephone Encounter (Signed)
My CMA, Ria Comment, will call to provide patient instructions. Thanks.

## 2020-04-02 NOTE — Telephone Encounter (Signed)
Okay thanks I sent the new prescription to start 600mg . Let her know to use the last of her 100mg  capsules to slowly go up---ie: 400mg  at bedtime for 2-3 days, then 500mg , then try the 600mg 

## 2020-04-02 NOTE — Telephone Encounter (Signed)
Dr. Silvio Pate,   During CCM visit, patient reported taking gabapentin 3 tablets every night at bedtime for neuropathy. She reports it worked very well at first but doesn't notice as much benefit now. She started alpha lipoic acid 600 mg daily last month and still having shooting pain waking her up at night. She will be out of the gabapentin soon and needs a refill. Would you agree to increase gabapentin to 600 mg at bedtime? Please send prescription to [facility] for your preferred dose. Larene [place] informed me Fraser Din is due for annual visit with you in February. Patient will call to schedule.     Thanks,  Debbora Dus, PharmD Clinical Pharmacist [facility] at [facility] [phone]

## 2020-04-03 NOTE — Patient Instructions (Addendum)
Dear Jacqueline Cole,  It was a pleasure meeting you during our initial appointment on 03/20/2020. Below is a summary of the goals we discussed and components of chronic care management. Please contact me anytime with questions or concerns.   Visit Information  Goals Addressed            This Visit's Progress   . Pharmacy Care Plan       CARE PLAN ENTRY (see longitudinal plan of care for additional care plan information)  Current Barriers:  . Chronic Disease Management support, education, and care coordination needs related to Diabetes and Neuropathy   Diabetes Lab Results  Component Value Date/Time   HGBA1C 6.0 (A) 02/04/2020 10:08 AM   HGBA1C 8.3 (A) 10/04/2019 09:52 AM   HGBA1C 8.2 (H) 10/13/2016 12:13 PM   HGBA1C 10.5 (H) 05/26/2016 08:56 AM   . Pharmacist Clinical Goal(s): o Over the next 3 months, patient will work with PharmD and providers to maintain A1c goal <7% . Current regimen:   Lantus 25 units daily  Metformin 1000 mg twice daily   Ozempic 1 mg weekly (Mondays) Interventions: o Reviewed home blood glucose log (average BG 95), no low blood glucose readings < 70 . Patient self care activities - Over the next 3 months, patient will: o Check blood sugar twice daily, document, and provide at future appointments o Contact provider with any episodes of hypoglycemia  Neuropathy  . Pharmacist Clinical Goal(s) o Over the next 30 days, patient will work with PharmD and providers to improve nerve pain . Current regimen:   Gabapentin 100 mg - 3 tablets at bedtime  Aspercreme - applies to feet every night  B12 daily   Alpha lipoic acid - 200 mg three times daily  Vinegar soaks at bedtime  . Interventions: o Consult PCP to consider gabapentin dose increase o Increase OTC alpha lipoic acid to 600 mg twice daily  . Patient self care activities - Over the next 30 days, patient will: o Continue medications as prescribed until further contact from  pharmacist  Medication management . Pharmacist Clinical Goal(s): o Over the next 3 months, patient will work with PharmD and providers to maintain optimal medication adherence . Current pharmacy: [facility] . Interventions o Comprehensive medication review performed. o Utilize [facility] for medication synchronization, packaging and delivery . Patient self care activities - Over the next 3 months, patient will: o Take medications as prescribed o Report any questions or concerns to PharmD and/or provider(s)  Additional Recommendations: . Recommend switching Benadryl to Allegra or Claritin for watery eyes. . Try Tylenol instead of Advil for headaches.   Initial goal documentation       Jacqueline Cole was given information about Chronic Care Management services today including:  1. CCM service includes personalized support from designated clinical staff supervised by her physician, including individualized plan of care and coordination with other care providers 2. 24/7 contact phone numbers for assistance for urgent and routine care needs. 3. Standard insurance, coinsurance, copays and deductibles apply for chronic care management only during months in which we provide at least 20 minutes of these services. Most insurances cover these services at 100%, however patients may be responsible for any copay, coinsurance and/or deductible if applicable. This service may help you avoid the need for more expensive face-to-face services. 4. Only one practitioner may furnish and bill the service in a calendar month. 5. The patient may stop CCM services at any time (effective at the end of  the month) by phone call to the office staff.  Patient agreed to services and verbal consent obtained.   Patient verbalizes understanding of instructions provided today.   Verbal consent obtained for [facility] enhanced pharmacy services (medication synchronization, adherence packaging, delivery  coordination). A medication sync plan was created to allow patient to get all medications delivered once every 30 to 90 days per patient preference. Patient understands they have freedom to choose pharmacy and clinical pharmacist will coordinate care between all prescribers and [facility].  Telephone follow up appointment with pharmacy team member scheduled for: June 25, 2020 at 8:30 AM (PHONE VISIT)  Debbora Dus, PharmD Clinical Pharmacist [facility] at [facility] [phone]   Basics of Medicine Management Taking your medicines correctly is an important part of managing or preventing medical problems. Make sure you know what disease or condition your medicine is treating, and how and when to take it. If you do not take your medicine correctly, it may not work well and may cause unpleasant side effects, including serious health problems. What should I do when I am taking medicines?  Read all the labels and inserts that come with your medicines. Review the information often.  Talk with your pharmacist if you get a refill and notice a change in the size, color, or shape of your medicines.  Know the potential side effects for each medicine that you take.  Try to get all your medicines from the same pharmacy. The pharmacist will have all your information and will understand how your medicines will affect each other (interact).  Tell your health care provider about all your medicines, including over-the-counter medicines, vitamins, and herbal or dietary supplements. He or she will make sure that nothing will interact with any of your prescribed medicines.   How can I take my medicines safely?  Take medicines only as told by your health care provider. ? Do not take more of your medicine than instructed. ? Do not take anyone else's medicines. ? Do not share your medicines with others. ? Do not stop taking your medicines unless your health care provider tells you to do  so. ? You may need to avoid alcohol or certain foods or liquids when taking certain medicines. Follow your health care provider's instructions.  Do not split, mash, or chew your medicines unless your health care provider tells you to do so. Tell your health care provider if you have trouble swallowing your medicines.  For liquid medicine, use the dosing container that was provided. How should I organize my medicines? Know your medicines  Know what each of your medicines looks like. This includes size, color, and shape. Tell your health care provider if you are having trouble recognizing all the medicines that you are taking.  If you cannot tell your medicines apart because they look similar, keep them in original bottles.  If you cannot read the labels on the bottles, tell your pharmacist to put your medicines in containers with large print.  Review your medicines and your schedule with family members, a friend, or a caregiver. Use a pill organizer  Use a tool to organize your medicine schedule. Tools include a weekly pillbox, a written chart, a notebook, or a calendar.  Your tool should help you remember the following things about each medicine: ? The name of the medicine. ? The amount (dose) to take. ? The schedule. This is the day and time the medicine should be taken. ? The appearance. This includes color,  shape, size, and stamp. ? How to take your medicines. This includes instructions to take them with food, without food, with fluids, or with other medicines.  Create reminders for taking your medicines. Use sticky notes, or alarms on your watch, mobile device, or phone calendar.  You may choose to use a more advanced management system. These systems have storage, alarms, and visual and audio prompts.  Some medicines can be taken on an "as-needed" basis. These include medicines for nausea or pain. If you take an as-needed medicine, write down the name and dose, as well as the date  and time that you took it.   How should I plan for travel?  Take your pillbox, medicines, and organization system with you when traveling.  Have your medicines refilled before you travel. This will ensure that you do not run out of your medicines while you are away from home.  Always carry an updated list of your medicines with you. If there is an emergency, a first responder can quickly see what medicines you are taking.  Do not pack your medicines in checked luggage in case your luggage is lost or delayed.  If any of your medicines is considered a controlled substance, make sure you bring a letter from your health care provider with you. How should I store and discard my medicines? For safe storage:  Store medicines in a cool, dry area away from light, or as directed by your health care provider. Do not store medicines in the bathroom. Heat and humidity will affect them.  Do not store your medicines with other chemicals, or with medicines for pets or other household members.  Keep medicines away from children and pets. Do not leave them on counters or bedside tables. Store them in high cabinets or on high shelves. For safe disposal:  Check expiration dates regularly. Do not take expired medicines. Discard medicines that are older than the expiration date.  Learn a safe way to dispose of your medicines. You may: ? Use a local government, hospital, or pharmacy medicine-take-back program. ? Mix the medicines with inedible substances, put them in a sealed bag or empty container, and throw them in the trash. What should I remember?  Tell your health care provider if you: ? Experience side effects. ? Have new symptoms. ? Have other concerns about taking your medicines.  Review your medicines regularly with your health care provider. Other medicines, diet, medical conditions, weight changes, and daily habits can all affect how medicines work. Ask if you need to continue taking each  medicine, and discuss how well each one is working.  Refill your medicines early to avoid running out of them.  In case of an accidental overdose, call your local [place] at [phone] or visit your local emergency department immediately. This is important. Summary  Taking your medicines correctly is an important part of managing or preventing medical problems.  You need to make sure that you understand what you are taking a medicine for, as well as how and when you need to take it.  Know your medicines and use a pill organizer to help you take your medicines correctly.  In case of an accidental overdose, call your local [place] at [phone] or visit your local emergency department immediately. This is important. This information is not intended to replace advice given to you by your health care provider. Make sure you discuss any questions you have with your health care provider. Document Revised: 02/24/2017 Document  Reviewed: 02/24/2017 Elsevier Patient Education  [place].

## 2020-04-15 ENCOUNTER — Telehealth: Payer: Self-pay | Admitting: Internal Medicine

## 2020-04-15 ENCOUNTER — Telehealth: Payer: Self-pay

## 2020-04-15 NOTE — Telephone Encounter (Signed)
Pt dropped off patient assistance paper work to be completed and faxed to PCP at [facility] office. Forms completed and faxed to [phone].

## 2020-04-15 NOTE — Telephone Encounter (Signed)
Patient's husband walked into clinic and stated he has some paperwork he would like to discuss with Jacqueline Cole or Jacqueline Cole. Pt left his annual income form and paperwork as well. Placed in [place].

## 2020-04-17 NOTE — Chronic Care Management (AMB) (Addendum)
Per telephone note - Contacted Ms. Creekmore regarding questions that she or her husband had about the patient assistance process (Ozempic and Lantus). Patient stated they had no questions. They dropped off the form and income documentation for the assistance application at the office. I advised patient that their information had been sent to [facility].   Follow-Up:  Patient Assistance Coordination and Pharmacist Review  Debbora Dus, CPP notified  Margaretmary Dys, [place] Assistant [phone]  I have reviewed the care management and care coordination activities outlined in this encounter and I am certifying that I agree with the content of this note. No further action required.  Debbora Dus, PharmD Clinical Pharmacist [facility] at [facility] [phone]

## 2020-04-30 ENCOUNTER — Telehealth: Payer: Self-pay | Admitting: Internal Medicine

## 2020-04-30 ENCOUNTER — Other Ambulatory Visit: Payer: Self-pay

## 2020-04-30 ENCOUNTER — Telehealth: Payer: PPO | Admitting: Internal Medicine

## 2020-04-30 ENCOUNTER — Telehealth: Payer: Self-pay

## 2020-04-30 NOTE — Telephone Encounter (Signed)
I left a detailed message for patient to call back and schedule AWV.  There is an opening on 05/16/20 at 9:30. Patient needs to come in to the office for the appointment.

## 2020-04-30 NOTE — Chronic Care Management (AMB) (Addendum)
Chronic Care Management Pharmacy Assistant   Name: Jacqueline Cole  MRN: [id number] DOB: Aug 23, 1950  Reason for Encounter: Medication Review- Medication Adherence and Delivery Coordination  Patient Question:  1.  Have you seen any other providers since your last CCM visit? No  PCP : Jacqueline Carbon, MD  Allergies:   Allergies  Allergen Reactions   Onglyza [Saxagliptin] Hives and Nausea And Vomiting   Liraglutide Nausea Only    Can't eat, nausea, higher sugars    Medications: Outpatient Encounter Medications as of 04/30/2020  Medication Sig   Blood Glucose Monitoring Suppl (ONETOUCH VERIO FLEX SYSTEM) w/Device KIT Use to check blood sugar 2 times a day.   gabapentin (NEURONTIN) 300 MG capsule Take 2 capsules (600 mg total) by mouth at bedtime.   glucose blood (ONETOUCH VERIO) test strip Use as instructed to check blood sugar 2 times a day.   ibuprofen (ADVIL,MOTRIN) 200 MG tablet Take 200 mg by mouth every 6 (six) hours as needed.   Insulin Syringe-Needle U-100 25G X 5/8" 1 ML MISC Use daily as directed   Lancets (ONETOUCH ULTRASOFT) lancets Use as instructed to check blood sugar 2 times a day.   LANTUS 100 UNIT/ML injection INJECT 25-50 UNITS SUBCUTANEOUSLY ONCE DAILY AT BEDTIME   metFORMIN (GLUCOPHAGE) 1000 MG tablet Take 1 tablet (1,000 mg total) by mouth 2 (two) times daily.   naproxen sodium (ALEVE) 220 MG tablet Take 220 mg by mouth.   rosuvastatin (CRESTOR) 10 MG tablet Take 1 tablet by mouth once daily   Semaglutide, 1 MG/DOSE, (OZEMPIC, 1 MG/DOSE,) 4 MG/3ML SOPN Inject 0.75 mLs (1 mg total) into the skin once a week.   venlafaxine XR (EFFEXOR-XR) 150 MG 24 hr capsule Take 1 capsule by mouth twice daily   No facility-administered encounter medications on file as of 04/30/2020.    Current Diagnosis: Patient Active Problem List   Diagnosis Date Noted   Multiple fibroadenomata of both breasts 04/24/2019   MDD (major depressive disorder), recurrent episode, moderate  (Jacqueline Cole) 08/09/2017   Osteopenia    Osteoarthritis of right knee 12/13/2016   Advance directive discussed with patient 12/13/2016   Type 2 diabetes mellitus with neurological manifestations, uncontrolled ([name]) 02/15/2012   Routine general medical examination at a health care facility 05/03/2011   Class II obesity 05/03/2011   Hyperlipemia 09/01/2006    Reviewed chart for medication changes ahead of medication coordination call.  No OVs, Consults, or hospital visits since last care coordination call / Pharmacist visit. No medication changes indicated  BP Readings from Last 3 Encounters:  02/04/20 130/82  10/04/19 120/88  06/07/19 130/70    Lab Results  Component Value Date   HGBA1C 6.0 (A) 02/04/2020    04/30/20 Left message to return call. Reached patient on 05/01/20.  Patient obtains medications through Adherence Packaging  30 Days   Patient is due for first adherence delivery on: 05/06/20  Contacted patient and reviewed medications and coordinated delivery.  This delivery to include: Adherence Packaging  30 Days  Crestor 10 mg- 1 tablet daily (1 breakfast) Gabapentin 300 mg - 2 capsules at bedtime  (2 Bedtime) Metformin 1000 mg- 1 tablet twice daily (1 Breakfast, 1 Evening meal) Venlafaxine ER 150 mg - 1 tablet twice daily (1 Breakfast, 1 Bedtime) OTC Alpha Lipoic Acid 600 mg - 1 tablet twice daily (1 Breakfast, 1 Evening Meal) OTC Biotin 2500 mcg- 1 tablet daily (1 Breakfast) OTC Vitamin B12 500 mcg- 1 tablet daily (1 Breakfast) OTC Vitamin D  1000 IU- 1 tablet daily - (1 Breakfast)  PRN/VIAL medications: NONE  Patient declined the following medications this month: Insulin Syringe-Needle U-100 25g X5/8" 1 mL-  PAP Applied for: Lantus U-100 Insulin 100 unit/ mL- 20-25 units daily Onetouch Ultrasoft lancets - Use to check BG twice daily Onetouch Verio Test Strips - Use to check BG twice daily PAP Applied for: Ozempic $RemoveBefo'1mg'YibyxwcALmt$ /dose - Inject 1 mL once weekly (Mondays)- States she  has unopened box of 3 that will last until May 19, 2020.   Patient does not need refills on any medication  Confirmed delivery date of 05/06/20, advised patient that pharmacy will contact her the morning of delivery.  Recent blood glucose readings are as follows: (Only has reading for today available) 05/01/20- 85  Patient states she has unopened box of 3 Ozempic that will last until May 19, 2020. She is hopeful that she will be approved for patient assistance before then. Will call patient back 05/14/20 to get update. Contacted [employer] for update on Ozempic. They state they have not received the provider portion of the application. Coordinated with Dr. Cruzita Cole to have Ozempic and Lantus applications faxed to manufacturer.  Follow-Up:  Comptroller, Patient Assistance Coordination and Pharmacist Review  Jacqueline Cole, CPP notified  Jacqueline Cole, [place] Assistant [phone]  I have reviewed the care management and care coordination activities outlined in this encounter and I am certifying that I agree with the content of this note. No further action required.  Jacqueline Cole, PharmD Clinical Pharmacist [name] [phone]

## 2020-05-01 NOTE — Telephone Encounter (Signed)
Patient portion of paperwork faxed to [place] on 04/22/2020. Still awaiting provider portion from Dr. Cruzita Lederer.  Debbora Dus, PharmD Clinical Pharmacist [facility] at [facility] [phone]

## 2020-05-01 NOTE — Telephone Encounter (Addendum)
If you still have these documents, can you refax the [employer] 4-5 directly to [employer] [phone]) and Lantus pages 4-5 to [place] [phone]). Faxes were received, but signatures illeligible.   Debbora Dus, PharmD Clinical Pharmacist [facility] at [facility] [phone]

## 2020-05-01 NOTE — Progress Notes (Signed)
Appointment canceled.

## 2020-05-01 NOTE — Assessment & Plan Note (Signed)
appt canceled

## 2020-05-05 NOTE — Telephone Encounter (Signed)
Ria Comment with [employer] called regarding patient assistance paperwork. Please see below message from Debbora Dus:

## 2020-05-05 NOTE — Telephone Encounter (Signed)
Application pages re faxed.

## 2020-05-05 NOTE — Chronic Care Management (AMB) (Addendum)
Contacted Dr. Arman Filter office to inform that provider signature was illegible on patient assistance applications. Requested Dr.Gherge resign and fax both Ozempic and Lantus patient assistance forms directly to manufacturer.   Follow-Up:  Care Coordination with Outside Provider, Patient Assistance Coordination and Pharmacist Review  Debbora Dus, CPP notified  Margaretmary Dys, [place] Pharm[facility] Assistant [phone]

## 2020-05-13 ENCOUNTER — Telehealth: Payer: Self-pay

## 2020-05-13 NOTE — Chronic Care Management (AMB) (Addendum)
    Chronic Care Management Pharmacy Assistant   Name: Jacqueline Cole  MRN: [id number] DOB: Feb 13, 1951  Reason for Encounter: Medication Delivery Coordination/PAP   PCP : Venia Carbon, MD  Allergies:   Allergies  Allergen Reactions   Onglyza [Saxagliptin] Hives and Nausea And Vomiting   Liraglutide Nausea Only    Can't eat, nausea, higher sugars    Medications: Outpatient Encounter Medications as of 05/13/2020  Medication Sig   Blood Glucose Monitoring Suppl (ONETOUCH VERIO FLEX SYSTEM) w/Device KIT Use to check blood sugar 2 times a day.   gabapentin (NEURONTIN) 300 MG capsule Take 2 capsules (600 mg total) by mouth at bedtime.   glucose blood (ONETOUCH VERIO) test strip Use as instructed to check blood sugar 2 times a day.   ibuprofen (ADVIL,MOTRIN) 200 MG tablet Take 200 mg by mouth every 6 (six) hours as needed.   Insulin Syringe-Needle U-100 25G X 5/8" 1 ML MISC Use daily as directed   Lancets (ONETOUCH ULTRASOFT) lancets Use as instructed to check blood sugar 2 times a day.   LANTUS 100 UNIT/ML injection INJECT 25-50 UNITS SUBCUTANEOUSLY ONCE DAILY AT BEDTIME   metFORMIN (GLUCOPHAGE) 1000 MG tablet Take 1 tablet (1,000 mg total) by mouth 2 (two) times daily.   naproxen sodium (ALEVE) 220 MG tablet Take 220 mg by mouth.   rosuvastatin (CRESTOR) 10 MG tablet Take 1 tablet by mouth once daily   Semaglutide, 1 MG/DOSE, (OZEMPIC, 1 MG/DOSE,) 4 MG/3ML SOPN Inject 0.75 mLs (1 mg total) into the skin once a week.   venlafaxine XR (EFFEXOR-XR) 150 MG 24 hr capsule Take 1 capsule by mouth twice daily   No facility-administered encounter medications on file as of 05/13/2020.    Current Diagnosis: Patient Active Problem List   Diagnosis Date Noted   Multiple fibroadenomata of both breasts 04/24/2019   MDD (major depressive disorder), recurrent episode, moderate ([place]) 08/09/2017   Osteopenia    Osteoarthritis of right knee 12/13/2016   Advance directive discussed with patient  12/13/2016   Type 2 diabetes mellitus with neurological manifestations, uncontrolled ([place]) 02/15/2012   Routine general medical examination at a health care facility 05/03/2011   Class II obesity 05/03/2011   Hyperlipemia 09/01/2006   Contacted [employer] regariding PAP update for Ozempic and Lantus. Ozempic will be needed from [facility] due to the medication not arriving until 15 business days from 05/12/20. Delivery is set up for 05/14/20.   Follow-Up:  Coordination of Enhanced Pharmacy Services and Pharmacist Review  Debbora Dus, CPP notified  Margaretmary Dys, [place] [phone]  Total time spent for month: 14

## 2020-05-13 NOTE — Telephone Encounter (Addendum)
--  Ozempic PAP approved 05/12/20. Should arrive in 15 business days at patient's home. --Lantus PAP application received. [employer] is Furniture conservator/restorer which can take 3-5 business days. CMA follow up in 7 days.  Patient has enough Lantus to last until 06/14/20. She needs a delivery of Ozempic as she will run out on 05/19/20. Coordinate with [facility].  Debbora Dus, PharmD Clinical Pharmacist [facility] at [facility] [phone]  Total time for month: 24 minutes

## 2020-05-14 NOTE — Chronic Care Management (AMB) (Addendum)
Patient called in to return call regarding Ozempic and Lantus PAPs. Patient states she was called from Dr. Alla German office and was told Lantus was there for her. I explained that she should receive Ozempic in the mail in about 3 weeks. Patient asked about cost, explained that since she was approved through patient assistance there is no cost to her for Ozempic or Lantus. Patient very happy about that and verbalized understanding.   Follow-Up:  Patient Assistance Coordination and Pharmacist Review  Debbora Dus, CPP notified  Margaretmary Dys, [place] [phone]  Total time spent for month: 40

## 2020-05-14 NOTE — Telephone Encounter (Signed)
Called and advised pt: Patient assistance Insulin has been delivered (Lantus 4 boxes) and is ready for pick up.

## 2020-05-14 NOTE — Telephone Encounter (Signed)
Pt picked up medication.

## 2020-05-21 NOTE — Telephone Encounter (Signed)
Inbound fax from [employer] 05/13/2020 advising pt has been approved for assistance through February 11, 2021 as long as eligibility guidelines are maintained.  Medication shipped and should be delivered within 10-14 business days.

## 2020-05-27 ENCOUNTER — Telehealth: Payer: Self-pay

## 2020-05-27 NOTE — Chronic Care Management (AMB) (Addendum)
Chronic Care Management Pharmacy Assistant   Name: Jacqueline Cole  MRN: [id number] DOB: 1950-05-11  Reason for Encounter: Medication Adherence and Delivery Coordination  Recent office visits:  04/30/20- Dr. Silvio Pate  Recent consult visits:  No recent consults  Hospital visits:  None in previous 6 months  Medications: Outpatient Encounter Medications as of 05/27/2020  Medication Sig   Blood Glucose Monitoring Suppl ([place]) w/Device KIT Use to check blood sugar 2 times a day.   gabapentin (NEURONTIN) 300 MG capsule Take 2 capsules (600 mg total) by mouth at bedtime.   glucose blood (ONETOUCH VERIO) test strip Use as instructed to check blood sugar 2 times a day.   ibuprofen (ADVIL,MOTRIN) 200 MG tablet Take 200 mg by mouth every 6 (six) hours as needed.   Insulin Syringe-Needle U-100 25G X 5/8" 1 ML MISC Use daily as directed   Lancets (ONETOUCH ULTRASOFT) lancets Use as instructed to check blood sugar 2 times a day.   LANTUS 100 UNIT/ML injection INJECT 25-50 UNITS SUBCUTANEOUSLY ONCE DAILY AT BEDTIME   metFORMIN (GLUCOPHAGE) 1000 MG tablet Take 1 tablet (1,000 mg total) by mouth 2 (two) times daily.   naproxen sodium (ALEVE) 220 MG tablet Take 220 mg by mouth.   rosuvastatin (CRESTOR) 10 MG tablet Take 1 tablet by mouth once daily   Semaglutide, 1 MG/DOSE, (OZEMPIC, 1 MG/DOSE,) 4 MG/3ML SOPN Inject 0.75 mLs (1 mg total) into the skin once a week.   venlafaxine XR (EFFEXOR-XR) 150 MG 24 hr capsule Take 1 capsule by mouth twice daily   No facility-administered encounter medications on file as of 05/27/2020.   BP Readings from Last 3 Encounters:  02/04/20 130/82  10/04/19 120/88  06/07/19 130/70    Lab Results  Component Value Date   HGBA1C 6.0 (A) 02/04/2020    Recent OV, Consult or Hospital visit: 04/30/20 Dr. Silvio Pate No medication changes indicated  Patient obtains medications through Adherence Packaging  30 Days   Last adherence delivery date:  05/06/20 Crestor 10 mg- 1 tablet daily (1 breakfast) Gabapentin 300 mg - 2 capsules at bedtime  (2 Bedtime) Metformin 1000 mg- 1 tablet twice daily (1 Breakfast, 1 Evening meal) Venlafaxine ER 150 mg - 1 tablet twice daily (1 Breakfast, 1 Bedtime) OTC Alpha Lipoic Acid 600 mg - 1 tablet twice daily (1 Breakfast, 1 Evening Meal) OTC Biotin 2500 mcg- 1 tablet daily (1 Breakfast) OTC Vitamin B12 500 mcg- 1 tablet daily (1 Breakfast) OTC Vitamin D 1000 IU- 1 tablet daily - (1 Breakfast)  Patient declined the following medications last delivery: Insulin Syringe-Needle U-100 25g X5/8" 1 mL-  Lantus U-100 Insulin 100 unit/ mL- 20-25 units daily gets from [facility] Onetouch Ultrasoft lancets - Use to check BG twice daily Onetouch Verio Test Strips - Use to check BG twice daily Ozempic $RemoveBefor'1mg'WCVarRCNoMEN$ /dose - Inject 1 mL once weekly (Mondays) gets from [facility]    Patient is due for next adherence delivery on: 06/04/20  Contacted patient and reviewed medications and coordinated delivery.  This delivery to include: Adherence Packaging  30 Days  Packs: Crestor 10 mg- 1 tablet daily (1 breakfast) Gabapentin 300 mg - 2 capsules at bedtime  (2 Bedtime) Metformin 1000 mg- 1 tablet twice daily (1 Breakfast, 1 Evening meal) Venlafaxine ER 150 mg - 1 tablet twice daily (1 Breakfast, 1 Bedtime) OTC Alpha Lipoic Acid 600 mg - 1 tablet twice daily (1 Breakfast, 1 Evening Meal) OTC Biotin 2500 mcg- 1 tablet daily (1 Breakfast)  OTC Vitamin B12 500 mcg- 1 tablet daily (1 Breakfast) OTC Vitamin D 1000 IU- 1 tablet daily - (1 Breakfast)   Patient declined the following medications this month: Lantus U-100 Insulin 100 unit/ mL- 20-25 units daily gets from [facility] Ozempic $RemoveBef'1mg'keMhJoipxE$ /dose - Inject 1 mL once weekly (Mondays) gets from [facility] Insulin Syringe-Needle U-100 25g X5/8" 1 mL- States she has enough on hand for about 1 month Onetouch Ultrasoft lancets - Use to check BG twice daily- States she has enough on hand for about 1 month Onetouch  Verio Test Strips - Use to check BG twice daily- States she has enough on hand for about 1 month  Patient does not need refills on any medications.  Confirmed delivery date of 06/04/20, advised patient that pharmacy will contact her the morning of delivery.  States she does not check her blood pressure.   Recent blood glucose readings are as follows:  States she checks BG everyday but does not keep a log. Asked her to start keeping track.   Fasting: states average is 90.  Before Meals: N/A  After Meals: N/A  Bedtime: N/A  Denies any blood sugar readings below 70.  Star Rating Drugs:  Medication:  Last Fill: Day Supply Rosuvastatin  05/05/20 30 Metformin  05/05/20 30  Follow-Up:  Coordination of Enhanced Pharmacy Services and Pharmacist Review  Debbora Dus, CPP notified  Margaretmary Dys, [place] [phone]  Total time spent for month: 40  I have reviewed the care management and care coordination activities outlined in this encounter and I am certifying that I agree with the content of this note. No further action required.  Debbora Dus, PharmD Clinical Pharmacist [facility] [phone]

## 2020-05-30 ENCOUNTER — Ambulatory Visit: Payer: PPO | Admitting: Internal Medicine

## 2020-05-30 ENCOUNTER — Encounter: Payer: Self-pay | Admitting: Internal Medicine

## 2020-05-30 ENCOUNTER — Other Ambulatory Visit: Payer: Self-pay

## 2020-05-30 VITALS — BP 120/78 | HR 93 | Ht 65.0 in | Wt 190.8 lb

## 2020-05-30 DIAGNOSIS — E1165 Type 2 diabetes mellitus with hyperglycemia: Secondary | ICD-10-CM

## 2020-05-30 DIAGNOSIS — E785 Hyperlipidemia, unspecified: Secondary | ICD-10-CM

## 2020-05-30 DIAGNOSIS — E669 Obesity, unspecified: Secondary | ICD-10-CM

## 2020-05-30 DIAGNOSIS — E1149 Type 2 diabetes mellitus with other diabetic neurological complication: Secondary | ICD-10-CM | POA: Diagnosis not present

## 2020-05-30 DIAGNOSIS — IMO0002 Reserved for concepts with insufficient information to code with codable children: Secondary | ICD-10-CM

## 2020-05-30 LAB — POCT GLYCOSYLATED HEMOGLOBIN (HGB A1C): Hemoglobin A1C: 5.9 % — AB (ref 4.0–5.6)

## 2020-05-30 NOTE — Progress Notes (Signed)
Patient ID: Jacqueline Cole, female   DOB: 10-08-1950, 70 y.o.   MRN: [id number]   This visit occurred during the SARS-CoV-2 public health emergency.  Safety protocols were in place, including screening questions prior to the visit, additional usage of staff PPE, and extensive cleaning of exam room while observing appropriate contact time as indicated for disinfecting solutions.   HPI: Jacqueline Cole is a 70 y.o.-year-old female, returning for follow-up for DM2, dx in 1997, insulin-dependent since 2000, uncontrolled, with complications (diabetic retinopathy, mild CKD, peripheral neuropathy).  Last visit 4 months ago.  Interim history: Since last OV, she has no complaints, other than neuropathis sxs and bilateral knee pain - she cannot walk too much anymore.  She has a walker with a seat when walking for longer distances. Sugars remain well controlled.  Reviewed HbA1c levels: Lab Results  Component Value Date   HGBA1C 6.0 (A) 02/04/2020   HGBA1C 8.3 (A) 10/04/2019   HGBA1C 6.6 (A) 06/07/2019   Pt is on a regimen of: - Metformin 1000 mg 2x a day, with meals - Lantus 60 units 2x a day >> ... >> 30 units at lunch >> 25 units 2x a day >> 25 units daily - Victoza 0.6 mg daily - started 07/2017 >> increased to 0.6 mg daily + 3 clicks >> 1.2 >> 1.8 mg daily >> Ozempic 1 mg weekly She was previously on Onglyza-developed facial rash on left side of face.  Pt checks her sugars once a day: - am:  84-122, 213, 216 >> 69-122, 133 >> 76-136, 160 - 2h after b'fast: 413 >> n/c >> 73-147, 168 >> n/c - before lunch: 87-106 >> n/c >> 140s, 200 >> n/c - 2h after lunch: 377, 420, 500  >> n/c >> 164, 277 >> n/c - before dinner: 92, 156-176, 220 >> 77, 147 >> n/c - 2h after dinner: 204 >> n/c >> 120-160, 200 x1 >> n/c - bedtime: n/c - nighttime: n/c Lowest sugar was 84 >> 69 >> 76; she has hypoglycemia awareness in the 80s. Highest sugar was 216 >> 200 >> 160.  Glucometer: One Touch ultra >>  Accu-Chek  Pt's meals are: - Breakfast: eggs, , sliced tomatoes >> toast, Propel water; rice crispies - Lunch: sandwich, pretzels >> half a sandwich + half an apple - Dinner: meat + 2 veggies - Snacks: fruit, almonds  -+ Mild CKD, last BUN/creatinine:  Lab Results  Component Value Date   BUN 16 04/24/2019   BUN 9 02/15/2018   CREATININE 1.10 04/24/2019   CREATININE 1.13 02/15/2018   -+ HL; last set of lipids: Lab Results  Component Value Date   CHOL 120 06/07/2019   HDL 36.60 (L) 06/07/2019   LDLCALC 46 06/07/2019   LDLDIRECT 174.0 04/24/2019   TRIG 190.0 (H) 06/07/2019   CHOLHDL 3 06/07/2019  On Crestor 10 now daily - last eye exam 03/2020: + DR reportedly, + cataracts, + history of surgery and IO injections, but she had headaches from these.  She sees Dr. Rodolph Bong.  - + numbness and tingling and also in her feet.  Started Neurontin 300 mg at bedtime. On B12.  At last visit I also suggested alpha-lipoic acid 600 mg twice a day -she takes this now.  Pt has no FH of DM.  She has a history of hysterectomy in 2001.  She was on LT4 as a teenager >> stopped a long time ago as her TFTs normalized.  Latest TFTs were reviewed and these were normal:  Lab Results  Component Value Date   TSH 3.85 02/01/2018   TSH 2.54 09/08/2012   TSH 1.52 05/03/2011   TSH 1.93 12/30/2009   TSH 1.95 12/25/2008   ROS: Constitutional: no weight gain/ no weight loss, no fatigue, no subjective hyperthermia, no subjective hypothermia Eyes: no blurry vision, no xerophthalmia ENT: no sore throat, no nodules palpated in neck, no dysphagia, no odynophagia, no hoarseness Cardiovascular: no CP/no SOB/no palpitations/no leg swelling Respiratory: no cough/no SOB/no wheezing Gastrointestinal: no N/no V/no D/no C/no acid reflux Musculoskeletal: no muscle aches/+ joint aches (knees) Skin: no rashes, no hair loss Neurological: no tremors/+ numbness/+ tingling/no dizziness  I reviewed pt's medications,  allergies, PMH, social hx, family hx, and changes were documented in the history of present illness. Otherwise, unchanged from my initial visit note.  Past Medical History:  Diagnosis Date  . Depression   . Diabetes mellitus type II   . Hyperlipemia   . Major depressive disorder, recurrent, in remission ([place]) 09/01/2006   Qualifier: Diagnosis of  By: Lelon Mast    . Obesity   . Osteopenia   . Syncope 1998   DM diagnosis   Past Surgical History:  Procedure Laterality Date  . ABDOMINAL HYSTERECTOMY  ~2005   and BSO  . BREAST BIOPSY Right 01/05/2019   rt stereo bx 2 areas 1st area ribbon clip  . BREAST BIOPSY Right 01/05/2019   rt stereo bx 2nd area coil clip  . CHOLECYSTECTOMY  1975  . COSMETIC SURGERY     Injury Right face as a child  . VAGINAL DELIVERY     X 2   Social History   Socioeconomic History  . Marital status: Married    Spouse name: Not on file  . Number of children: 2  . Years of education: Not on file  . Highest education level: Not on file  Occupational History    Comment: "Just up and quit" at [employer] Use  . Smoking status: Former Smoker    Quit date: 03/15/1993    Years since quitting: 27.2  . Smokeless tobacco: Never Used  Substance and Sexual Activity  . Alcohol use: No    Alcohol/week: 0.0 standard drinks  . Drug use: No  . Sexual activity: Not on file  Other Topics Concern  . Not on file  Social History Narrative   Has living will   Husband, then daughter [name], have health care POA   Would reluctantly accept resuscitation but no life prolonging measures   Social Determinants of Health   Financial Resource Strain: Low Risk   . Difficulty of Paying Living Expenses: Not very hard  Food Insecurity: Not on file  Transportation Needs: Not on file  Physical Activity: Not on file  Stress: Not on file  Social Connections: Not on file  Intimate Partner Violence: Not on file   Current Outpatient Medications on File Prior to Visit   Medication Sig Dispense Refill  . Blood Glucose Monitoring Suppl ([place]) w/Device KIT Use to check blood sugar 2 times a day. 1 kit 0  . gabapentin (NEURONTIN) 300 MG capsule Take 2 capsules (600 mg total) by mouth at bedtime. 180 capsule 3  . glucose blood (ONETOUCH VERIO) test strip Use as instructed to check blood sugar 2 times a day. 200 each 12  . ibuprofen (ADVIL,MOTRIN) 200 MG tablet Take 200 mg by mouth every 6 (six) hours as needed.    . Insulin Syringe-Needle U-100 25G X 5/8"  1 ML MISC Use daily as directed 100 each 2  . Lancets (ONETOUCH ULTRASOFT) lancets Use as instructed to check blood sugar 2 times a day. 200 each 12  . LANTUS 100 UNIT/ML injection INJECT 25-50 UNITS SUBCUTANEOUSLY ONCE DAILY AT BEDTIME 30 mL 3  . metFORMIN (GLUCOPHAGE) 1000 MG tablet Take 1 tablet (1,000 mg total) by mouth 2 (two) times daily. 180 tablet 0  . naproxen sodium (ALEVE) 220 MG tablet Take 220 mg by mouth.    . rosuvastatin (CRESTOR) 10 MG tablet Take 1 tablet by mouth once daily 90 tablet 1  . Semaglutide, 1 MG/DOSE, (OZEMPIC, 1 MG/DOSE,) 4 MG/3ML SOPN Inject 0.75 mLs (1 mg total) into the skin once a week. 9 mL 3  . venlafaxine XR (EFFEXOR-XR) 150 MG 24 hr capsule Take 1 capsule by mouth twice daily 180 capsule 3   No current facility-administered medications on file prior to visit.   Allergies  Allergen Reactions  . Onglyza [Saxagliptin] Hives and Nausea And Vomiting  . Liraglutide Nausea Only    Can't eat, nausea, higher sugars   Family History  Problem Relation Age of Onset  . Stroke Father        CVA  . Heart disease Father        MI  . Cancer Sister        breast cancer  . Breast cancer Sister 70  . Heart disease Brother        MI  . Cancer Brother        Lung    PE: BP 120/78 (BP Location: Right Arm, Patient Position: Sitting, Cuff Size: Normal)   Pulse 93   Ht $R'5\' 5"'WL$  (1.651 m)   Wt 190 lb 12.8 oz (86.5 kg)   SpO2 96%   BMI 31.75 kg/m  Wt Readings  from Last 3 Encounters:  05/30/20 190 lb 12.8 oz (86.5 kg)  02/04/20 198 lb 3.2 oz (89.9 kg)  10/04/19 208 lb (94.3 kg)   Constitutional: overweight, in NAD Eyes: PERRLA, EOMI, no exophthalmos ENT: moist mucous membranes, no thyromegaly, no cervical lymphadenopathy Cardiovascular: RRR, No MRG Respiratory: CTA B Gastrointestinal: abdomen soft, NT, ND, BS+ Musculoskeletal: no deformities, strength intact in all 4 Skin: moist, warm, no rashes Neurological: no tremor with outstretched hands, DTR normal in all 4  ASSESSMENT: 1. DM2, insulin-dependent, uncontrolled, with complications - DR - PN - mild CKD  2. HL  3. Obesity class II BMI Classification:  < 18.5 underweight   18.5-24.9 normal weight   25.0-29.9 overweight   30.0-34.9 class I obesity   35.0-39.9 class II obesity   ? 40.0 class III obesity   PLAN:  1. Patient with longstanding, uncontrolled, type 2 diabetes, on oral antidiabetic regimen with Metformin, also, long-acting insulin and weekly GLP-1 receptor agonist, with most sugars at goal at last visit.  At that time, HbA1c was excellent, at 6.0%.  We were able to change from Victoza to [place] and she gets this through the patient assistance program. -At today's visit, sugars remain well controlled except for a high blood sugar at 160 this morning.  She is only checking in the morning and I strongly advised her to also check some blood sugars later in the day.  However, otherwise, I do not feel we need to change her regimen.  She tolerates Ozempic well, without side effects. -At last visit, she complained of neuropathy and I recommended to start alpha-lipoic acid.  She is already using Aspercreme, and also Neurontin  and B12.  She did start the supplement but she did not notice a significant improvement. - I suggested to:  Patient Instructions  Please continue: - Metformin 1000 mg 2x a day - Lantus 25 units daily at bedtime - Ozempic 1 mg weekly  Also,  continue: - Alpha Lipoic Acid 600 mg 2x a day  Please return in 4-6 months with your sugar log.   - we checked her HbA1c: 5.9%  (excellent, lower) - advised to check sugars at different times of the day - 1x a day, rotating check times - advised for yearly eye exams >> she is UTD - return to clinic in 4-6 months  2. HL -Reviewed latest lipid panel from a year ago: LDL much improved on Crestor, now at goal, triglycerides high, HDL slightly low: Lab Results  Component Value Date   CHOL 120 06/07/2019   HDL 36.60 (L) 06/07/2019   LDLCALC 46 06/07/2019   LDLDIRECT 174.0 04/24/2019   TRIG 190.0 (H) 06/07/2019   CHOLHDL 3 06/07/2019  -She was on atorvastatin and rosuvastatin in the past but had nausea with these.  We initially started Crestor 10 milligrams once a week and moved it daily.  She tolerates it well.  3. Obesity class II -She continues on Ozempic which should also help with weight loss -She gained 10 pounds before last visit and 6 pounds before the previous visit -She lost 8 pounds since last visit  Philemon Kingdom, MD PhD [facility]

## 2020-05-30 NOTE — Patient Instructions (Signed)
Please continue: - Metformin 1000 mg 2x a day - Lantus 25 units daily at bedtime - Ozempic 1 mg weekly  Please continue: - Alpha Lipoic Acid 600 mg 2x a day  Please return in 4-6 months with your sugar log.

## 2020-05-30 NOTE — Addendum Note (Signed)
Addended by: Lauralyn Primes on: 05/30/2020 09:01 AM   Modules accepted: Orders

## 2020-06-04 NOTE — Telephone Encounter (Signed)
Called and advised pt assistance Ozempic has been delivered and is ready for pick up.

## 2020-06-25 ENCOUNTER — Telehealth: Payer: Self-pay

## 2020-06-25 ENCOUNTER — Telehealth: Payer: PPO

## 2020-06-25 NOTE — Progress Notes (Deleted)
Chronic Care Management Pharmacy Note  06/25/2020 Name:  Jacqueline Cole MRN:  [id number] DOB:  1951/01/25  Subjective: Jacqueline Cole is an 70 y.o. year old female who is a primary patient of Jacqueline Carbon, MD.  The CCM team was consulted for assistance with disease management and care coordination needs.    {CCMTELEPHONEFACETOFACE:21091510} for {CCMINITIALFOLLOWUPCHOICE:21091511} in response to provider referral for pharmacy case management and/or care coordination services.   Consent to Services:  {CCMCONSENTOPTIONS:25074}  Patient Care Team: Jacqueline Carbon, MD as PCP - Jacqueline Cole, [facility] as Pharmacist (Pharmacist)  Recent office visits: ***  Recent consult visits:   Hospital visits: {Hospital DC Yes/No:25215}  Objective:  Lab Results  Component Value Date   CREATININE 1.10 04/24/2019   BUN 16 04/24/2019   GFR 49.41 (L) 04/24/2019   GFRNONAA 53.55 12/30/2009   GFRAA 66 10/23/2007   NA 140 04/24/2019   K 4.1 04/24/2019   CALCIUM 9.8 04/24/2019   CO2 30 04/24/2019   GLUCOSE 163 (H) 04/24/2019    Lab Results  Component Value Date/Time   HGBA1C 5.9 (A) 05/30/2020 09:01 AM   HGBA1C 6.0 (A) 02/04/2020 10:08 AM   HGBA1C 8.2 (H) 10/13/2016 12:13 PM   HGBA1C 10.5 (H) 05/26/2016 08:56 AM   GFR 49.41 (L) 04/24/2019 09:30 AM   GFR 51.09 (L) 02/15/2018 10:02 AM   MICROALBUR 4.0 (H) 11/26/2015 09:58 AM   MICROALBUR 0.8 09/08/2012 09:36 AM    Last diabetic Eye exam:  Lab Results  Component Value Date/Time   HMDIABEYEEXA Retinopathy (A) 04/25/2017 12:00 AM    Last diabetic Foot exam:  Lab Results  Component Value Date/Time   HMDIABFOOTEX done 04/24/2019 12:00 AM     Lab Results  Component Value Date   CHOL 120 06/07/2019   HDL 36.60 (L) 06/07/2019   LDLCALC 46 06/07/2019   LDLDIRECT 174.0 04/24/2019   TRIG 190.0 (H) 06/07/2019   CHOLHDL 3 06/07/2019    Hepatic Function Latest Ref Rng & Units 04/24/2019 02/15/2018 12/13/2016  Total Protein  6.0 - 8.3 g/dL 6.9 7.1 6.7  Albumin 3.5 - 5.2 g/dL 4.3 4.4 4.1  AST 0 - 37 U/L $Remo'22 28 19  'tZLTY$ ALT 0 - 35 U/L $Remo'16 19 16  'NucSh$ Alk Phosphatase 39 - 117 U/L 74 85 85  Total Bilirubin 0.2 - 1.2 mg/dL 0.9 1.1 0.7  Bilirubin, Direct 0.0 - 0.3 mg/dL - - -    Lab Results  Component Value Date/Time   TSH 3.85 02/01/2018 08:51 AM   TSH 2.54 09/08/2012 09:36 AM   FREET4 0.76 02/15/2018 10:02 AM   FREET4 0.71 11/26/2015 09:58 AM    CBC Latest Ref Rng & Units 04/24/2019 02/15/2018 12/13/2016  WBC 4.0 - 10.5 K/uL 9.9 10.4 10.8(H)  Hemoglobin 12.0 - 15.0 g/dL 13.8 14.2 14.2  Hematocrit 36.0 - 46.0 % 41.5 43.1 42.9  Platelets 150.0 - 400.0 K/uL 276.0 305.0 284.0    No results found for: VD25OH  Clinical ASCVD: {YES/NO:21197} The ASCVD Risk score Mikey Bussing DC Jr., et al., 2013) failed to calculate for the following reasons:   The valid total cholesterol range is 130 to 320 mg/dL    No flowsheet data found.   ***Other: (CHADS2VASc if Afib, MMRC or CAT for COPD, ACT, DEXA)  Social History   Tobacco Use  Smoking Status Former Smoker  . Quit date: 03/15/1993  . Years since quitting: 27.2  Smokeless Tobacco Never Used   BP Readings from Last 3 Encounters:  05/30/20 120/78  02/04/20 130/82  10/04/19 120/88   Pulse Readings from Last 3 Encounters:  05/30/20 93  02/04/20 80  10/04/19 85   Wt Readings from Last 3 Encounters:  05/30/20 190 lb 12.8 oz (86.5 kg)  02/04/20 198 lb 3.2 oz (89.9 kg)  10/04/19 208 lb (94.3 kg)   BMI Readings from Last 3 Encounters:  05/30/20 31.75 kg/m  02/04/20 32.98 kg/m  10/04/19 34.61 kg/m    Assessment/Interventions: Review of patient past medical history, allergies, medications, health status, including review of consultants reports, laboratory and other test data, was performed as part of comprehensive evaluation and provision of chronic care management services.   SDOH:  (Social Determinants of Health) assessments and interventions performed:  {yes/no:20286}  SDOH Screenings   Alcohol Screen: Not on file  Depression (HOZ2-2): Not on file  Financial Resource Strain: Low Risk   . Difficulty of Paying Living Expenses: Not very hard  Food Insecurity: Not on file  Housing: Not on file  Physical Activity: Not on file  Social Connections: Not on file  Stress: Not on file  Tobacco Use: Medium Risk  . Smoking Tobacco Use: Former Smoker  . Smokeless Tobacco Use: Never Used  Transportation Needs: Not on file    CCM Care Plan  Allergies  Allergen Reactions  . Onglyza [Saxagliptin] Hives and Nausea And Vomiting  . Liraglutide Nausea Only    Can't eat, nausea, higher sugars    Medications Reviewed Today    Reviewed by Philemon Kingdom, MD (Physician) on 05/30/20 at [place] List Status: <None>  Medication Order Taking? Sig Documenting Provider Last Dose Status Informant  Blood Glucose Monitoring Suppl (ONETOUCH VERIO FLEX SYSTEM) w/Device KIT [id number] Yes Use to check blood sugar 2 times a day. Philemon Kingdom, MD Taking Active   gabapentin (NEURONTIN) 300 MG capsule [id number] Yes Take 2 capsules (600 mg total) by mouth at bedtime. Jacqueline Carbon, MD Taking Active   glucose blood [facility] VERIO) test strip [id number] Yes Use as instructed to check blood sugar 2 times a day. Philemon Kingdom, MD Taking Active   ibuprofen (ADVIL,MOTRIN) 200 MG tablet [id number] Yes Take 200 mg by mouth every 6 (six) hours as needed. [provider] Taking Active   Insulin Syringe-Needle U-100 25G X 5/8" 1 ML MISC [id number] Yes Use daily as directed Philemon Kingdom, MD Taking Active   Lancets ([place]) lancets [id number] Yes Use as instructed to check blood sugar 2 times a day. Philemon Kingdom, MD Taking Active   LANTUS 100 UNIT/ML injection [id number] Yes INJECT 25-50 UNITS SUBCUTANEOUSLY ONCE DAILY AT BEDTIME Philemon Kingdom, MD Taking Active   metFORMIN (GLUCOPHAGE) 1000 MG tablet [id number] Yes Take 1 tablet (1,000  mg total) by mouth 2 (two) times daily. Philemon Kingdom, MD Taking Active   naproxen sodium (ALEVE) 220 MG tablet [id number] Yes Take 220 mg by mouth. [provider] Taking Active   rosuvastatin (CRESTOR) 10 MG tablet [id number] Yes Take 1 tablet by mouth once daily Philemon Kingdom, MD Taking Active   Semaglutide, 1 MG/DOSE, (OZEMPIC, 1 MG/DOSE,) 4 MG/3ML SOPN [id number] Yes Inject 0.75 mLs (1 mg total) into the skin once a week. Philemon Kingdom, MD Taking Active   venlafaxine XR (EFFEXOR-XR) 150 MG 24 hr capsule [id number] Yes Take 1 capsule by mouth twice daily Jacqueline Carbon, MD Taking Active           Patient Active Problem List   Diagnosis Date Noted  . Multiple fibroadenomata of both breasts 04/24/2019  .  MDD (major depressive disorder), recurrent episode, moderate ([place]) 08/09/2017  . Osteopenia   . Osteoarthritis of right knee 12/13/2016  . Advance directive discussed with patient 12/13/2016  . Type 2 diabetes mellitus with neurological manifestations, uncontrolled ([place]) 02/15/2012  . Routine general medical examination at a health care facility 05/03/2011  . Class II obesity 05/03/2011  . Hyperlipemia 09/01/2006    Immunization History  Administered Date(s) Administered  . Influenza Split 01/14/2011  . Influenza Whole 12/25/2008, 12/30/2009  . Influenza-Unspecified 01/30/2014  . Pneumococcal Conjugate-13 12/13/2016  . Pneumococcal Polysaccharide-23 12/25/2008  . Td 12/01/2005, 11/26/2015    Conditions to be addressed/monitored:  {USCCMDZASSESSMENTOPTIONS:23563}  There are no care plans that you recently modified to display for this patient.   Medication Assistance: {MEDASSISTANCEINFO:25044}  Patient's preferred pharmacy is:  [facility] - [place], [place] - [address] [place] [address] Phone: [phone] Fax: [phone]  Uses pill box? {Yes or If no, why not?:20788} Pt endorses  ***% compliance  We discussed: {Pharmacy options:24294} Patient decided to: {US Pharmacy IHDT:91225}  Care Plan and Follow Up Patient Decision:  {FOLLOWUP:24991}  Plan: {CM FOLLOW UP YTMM:21947}  Debbora Dus, PharmD Clinical Pharmacist [facility] at [facility] [phone]

## 2020-06-27 ENCOUNTER — Telehealth: Payer: Self-pay

## 2020-06-27 NOTE — Chronic Care Management (AMB) (Addendum)
Chronic Care Management Pharmacy Assistant   Name: Jacqueline Cole  MRN: [id number] DOB: 03-22-1950   Reason for Encounter: Medication Review - Medication adherence and delivery coordination    Office Visits: 04/30/20- Dr. Alphonsus Sias - appt cancelled by patient  03/30/20 - Initial CCM visit   Consult Visit 05/30/2020  Dr. Ernest Haber, endocrinology - A1c 5.6%, continue current medications - RTC 4-6 months  02/04/20 Dr. Ernest Haber, endocrinology  - cont current meds, recommend alpha lipoic acid, rtc 4 months  Hospital visits:  None in previous 6 months  Medications: Outpatient Encounter Medications as of 06/27/2020  Medication Sig   Blood Glucose Monitoring Suppl (ONETOUCH VERIO FLEX SYSTEM) w/Device KIT Use to check blood sugar 2 times a day.   gabapentin (NEURONTIN) 300 MG capsule Take 2 capsules (600 mg total) by mouth at bedtime.   glucose blood (ONETOUCH VERIO) test strip Use as instructed to check blood sugar 2 times a day.   ibuprofen (ADVIL,MOTRIN) 200 MG tablet Take 200 mg by mouth every 6 (six) hours as needed.   Insulin Syringe-Needle U-100 25G X 5/8" 1 ML MISC Use daily as directed   Lancets (ONETOUCH ULTRASOFT) lancets Use as instructed to check blood sugar 2 times a day.   LANTUS 100 UNIT/ML injection INJECT 25-50 UNITS SUBCUTANEOUSLY ONCE DAILY AT BEDTIME   metFORMIN (GLUCOPHAGE) 1000 MG tablet Take 1 tablet (1,000 mg total) by mouth 2 (two) times daily.   naproxen sodium (ALEVE) 220 MG tablet Take 220 mg by mouth.   rosuvastatin (CRESTOR) 10 MG tablet Take 1 tablet by mouth once daily   Semaglutide, 1 MG/DOSE, (OZEMPIC, 1 MG/DOSE,) 4 MG/3ML SOPN Inject 0.75 mLs (1 mg total) into the skin once a week.   venlafaxine XR (EFFEXOR-XR) 150 MG 24 hr capsule Take 1 capsule by mouth twice daily   No facility-administered encounter medications on file as of 06/27/2020.   BP Readings from Last 3 Encounters:  05/30/20 120/78  02/04/20 130/82  10/04/19 120/88    Lab  Results  Component Value Date   HGBA1C 5.9 (A) 05/30/2020     Unable to reach patient to review medication adherence. Left Voicemail. Coordinated delivery based on fill dates.  Patient obtains medications through Adherence Packaging  30 Days   Last adherence delivery date:  06/04/2020, 30 DS  Patient is due for next adherence delivery on: 07/04/2020   This delivery to include: Adherence Packaging  30 Days   Crestor 10 mg- 1 tablet daily (1 breakfast)  Gabapentin 300 mg - 2 capsules at bedtime (2 Bedtime)  Metformin 1000 mg- 1 tablet twice daily (1 Breakfast, 1 Evening meal)  Venlafaxine ER 150 mg - 1 tablet twice daily (1 Breakfast, 1 Bedtime)  OTC Alpha Lipoic Acid 600 mg - 1 tablet twice daily (1 Breakfast, 1 Evening Meal)  OTC Biotin 2500 mcg- 1 tablet daily (1 Breakfast)  OTC Vitamin B12 500 mcg- 1 tablet daily (1 Breakfast)  OTC Vitamin D 1000 IU- 1 tablet daily - (1 Breakfast)  PRN/VIAL medications: None   Patient not due for the following medications this month: Insulin Syringe-Needle U-100 25g X5/8" 1 mL-  Lantus U-100 Insulin 100 unit/ mL- 20-25 units daily- Gets from PAP Onetouch Ultrasoft lancets - Use to check BG twice daily Onetouch Verio Test Strips - Use to check BG twice daily Ozempic 1mg /dose - Inject 1 mL once weekly (Mondays) gets from PAP  Patient needs refills on the following medications: None  Confirmed delivery date of  07/04/2020. Pharmacy will contact her the morning of delivery.  Follow-Up:  Coordination of Enhanced Pharmacy Services and Pharmacist Review  Debbora Dus, CPP notified  [name], [place] Assistant [phone]  I have reviewed the care management and care coordination activities outlined in this encounter and I am certifying that I agree with the content of this note. No further action required.  Debbora Dus, PharmD Clinical Pharmacist [facility] at [facility] [phone]

## 2020-07-04 NOTE — Telephone Encounter (Signed)
   Chronic Care Management Pharmacy Note  07/04/2020 Name:  Jacqueline Cole MRN:  [id number] DOB:  03/03/1951  Subjective: Jacqueline Cole is an 70 y.o. year old female who is a primary patient of Jacqueline Carbon, MD.  The CCM team was consulted for assistance with disease management and care coordination needs.    Attempted to reach patient by telephone for follow up visit in response to provider referral for pharmacy case management and/or care coordination services.   Consent to Services:  The patient was given information about Chronic Care Management services, agreed to services, and gave verbal consent prior to initiation of services.  Please see initial visit note for detailed documentation.   Patient Care Team: Jacqueline Carbon, MD as PCP - Jacqueline Cole, Jacqueline Cole as Pharmacist (Pharmacist)  Office Visits: 04/30/20- Dr. Silvio Cole - appt cancelled by patient  03/30/20 - Initial CCM visit  Consult Visit 05/30/2020  Dr. Benjiman Cole, endocrinology - A1c 5.6%, continue current medications - RTC 4-6 months  02/04/20 Dr. Benjiman Cole, endocrinology  - cont current meds, recommend alpha lipoic acid, rtc 4 months  Hospital visits: None in previous 6 months  Plan: -Patient missed AWV with Dr. Silvio Cole 04/2020. CMA will remind patient to contact office for scheduling. -Reschedule CCM telephone visit for July 2022   Jacqueline Cole, PharmD Clinical Pharmacist [facility] at [facility] [phone]

## 2020-07-24 ENCOUNTER — Telehealth: Payer: Self-pay

## 2020-07-24 NOTE — Telephone Encounter (Signed)
Called pt informed her that she has 4 vial of lantus ready for pickup

## 2020-07-28 ENCOUNTER — Telehealth: Payer: Self-pay

## 2020-07-28 NOTE — Chronic Care Management (AMB) (Addendum)
Chronic Care Management Pharmacy Assistant   Name: Jacqueline Cole  MRN: [id number] DOB: 1950-05-19   Reason for Encounter: Medication Adherence and Delivery Coordination   Recent office visits:  None since last CCM contact  Recent consult visits:  None since last CCM contact  Hospital visits:  None in previous 6 months  Medications: Outpatient Encounter Medications as of 07/28/2020  Medication Sig   Blood Glucose Monitoring Suppl ([place]) w/Device KIT Use to check blood sugar 2 times a day.   gabapentin (NEURONTIN) 300 MG capsule Take 2 capsules (600 mg total) by mouth at bedtime.   glucose blood (ONETOUCH VERIO) test strip Use as instructed to check blood sugar 2 times a day.   ibuprofen (ADVIL,MOTRIN) 200 MG tablet Take 200 mg by mouth every 6 (six) hours as needed.   Insulin Syringe-Needle U-100 25G X 5/8" 1 ML MISC Use daily as directed   Lancets (ONETOUCH ULTRASOFT) lancets Use as instructed to check blood sugar 2 times a day.   LANTUS 100 UNIT/ML injection INJECT 25-50 UNITS SUBCUTANEOUSLY ONCE DAILY AT BEDTIME   metFORMIN (GLUCOPHAGE) 1000 MG tablet Take 1 tablet (1,000 mg total) by mouth 2 (two) times daily.   naproxen sodium (ALEVE) 220 MG tablet Take 220 mg by mouth.   rosuvastatin (CRESTOR) 10 MG tablet Take 1 tablet by mouth once daily   Semaglutide, 1 MG/DOSE, (OZEMPIC, 1 MG/DOSE,) 4 MG/3ML SOPN Inject 0.75 mLs (1 mg total) into the skin once a week.   venlafaxine XR (EFFEXOR-XR) 150 MG 24 hr capsule Take 1 capsule by mouth twice daily   No facility-administered encounter medications on file as of 07/28/2020.   BP Readings from Last 3 Encounters:  05/30/20 120/78  02/04/20 130/82  10/04/19 120/88    Lab Results  Component Value Date   HGBA1C 5.9 (A) 05/30/2020    No OVs, Consults, or hospital visits since last care coordination call / Pharmacist visit.  Patient obtains medications through Adherence Packaging  30 Days   Last adherence  delivery date: 07/03/2020  Patient is due for next adherence delivery on: 08/05/2020  Unable to reach the patient, coordinated delivery based on fill dates  This delivery to include: Adherence Packaging  30 Days   Crestor 10 mg- 1 tablet daily (1 breakfast)             Gabapentin 300 mg - 2 capsules at bedtime (2 Bedtime)  Metformin 1000 mg- 1 tablet twice daily (1 Breakfast, 1 Evening meal)  Venlafaxine ER 150 mg - 1 tablet twice daily (1 Breakfast, 1 Bedtime)  OTC Alpha Lipoic Acid 600 mg - 1 tablet twice daily (1 Breakfast, 1 Evening Meal)  OTC Biotin 2500 mcg- 1 tablet daily (1 Breakfast)  OTC Vitamin B12 500 mcg- 1 tablet daily (1 Breakfast)  OTC Vitamin D 1000 IU- 1 tablet daily - (1 Breakfast)  PRN/VIAL medications: None  Patient declined the following medications this month: Insulin Syringe-Needle U-100 25g X5/8" 1 mL-  Lantus U-100 Insulin 100 unit/ mL- 20-25 units daily- Gets from PAP Onetouch Ultrasoft lancets - Use to check BG twice daily Onetouch Verio Test Strips - Use to check BG twice daily Ozempic 20m/dose - Inject 1 mL once weekly (Mondays) gets from PAP  Refills requested from PCP include:  Venlafaxine ER 150 1 tablet 2 times daily Metformin 10031m 1 tablet 2 times daily(requested from specialist)  Left a message that delivery will be on 08/05/2020 in the afternoon.  Follow-Up:  Comptroller and Pharmacist Review  Debbora Dus, CPP notified  [name], [place] Assistant [phone]  I have reviewed the care management and care coordination activities outlined in this encounter and I am certifying that I agree with the content of this note. No further action required.  Debbora Dus, PharmD Clinical Pharmacist [facility] at [facility] [phone]

## 2020-07-30 ENCOUNTER — Other Ambulatory Visit: Payer: Self-pay | Admitting: Internal Medicine

## 2020-07-30 DIAGNOSIS — IMO0002 Reserved for concepts with insufficient information to code with codable children: Secondary | ICD-10-CM

## 2020-07-30 DIAGNOSIS — E1165 Type 2 diabetes mellitus with hyperglycemia: Secondary | ICD-10-CM

## 2020-07-30 NOTE — Chronic Care Management (AMB) (Addendum)
Refill request for venlafaxine ER 150 mg was sent to Dr. Alla German CMA by staff message to coordinate patient medication sync and delivery.  Follow-Up:  Pharmacist Review  Debbora Dus, CPP notified  Margaretmary Dys, [place] Pharmacy Assistant [phone]

## 2020-07-31 ENCOUNTER — Telehealth: Payer: Self-pay

## 2020-07-31 MED ORDER — VENLAFAXINE HCL ER 150 MG PO CP24: 150.0000 mg | ORAL_CAPSULE | Freq: Two times a day (BID) | ORAL | 0 refills | Status: DC

## 2020-07-31 NOTE — Telephone Encounter (Signed)
Rx sent electronically.  

## 2020-07-31 NOTE — Telephone Encounter (Signed)
-----   Message from [place] sent at 07/30/2020 12:23 PM EDT ----- Regarding: Refill Patient needs refill on Venlafaxine ER 150 mg. Please send in to [facility] if refill appropriate.  Thank you,  Margaretmary Dys, [place] Pharmacy Assistant [phone]

## 2020-08-25 ENCOUNTER — Telehealth: Payer: Self-pay

## 2020-08-25 NOTE — Chronic Care Management (AMB) (Addendum)
Chronic Care Management Pharmacy Assistant   Name: Jacqueline Cole  MRN: [id number] DOB: November 02, 1950  Reason for Encounter:  Medication Adherence and Delivery Coordination  Recent office visits:  None since last CCM contact  Recent consult visits:  None since last CCM contact  Hospital visits:  None in previous 6 months  Medications: Outpatient Encounter Medications as of 08/25/2020  Medication Sig   Blood Glucose Monitoring Suppl ([place]) w/Device KIT Use to check blood sugar 2 times a day.   gabapentin (NEURONTIN) 300 MG capsule Take 2 capsules (600 mg total) by mouth at bedtime.   glucose blood (ONETOUCH VERIO) test strip Use as instructed to check blood sugar 2 times a day.   ibuprofen (ADVIL,MOTRIN) 200 MG tablet Take 200 mg by mouth every 6 (six) hours as needed.   Insulin Syringe-Needle U-100 25G X 5/8" 1 ML MISC Use daily as directed   Lancets (ONETOUCH ULTRASOFT) lancets Use as instructed to check blood sugar 2 times a day.   LANTUS 100 UNIT/ML injection INJECT 25-50 UNITS SUBCUTANEOUSLY ONCE DAILY AT BEDTIME   metFORMIN (GLUCOPHAGE) 1000 MG tablet TAKE ONE TABLET BY MOUTH TWICE DAILY   naproxen sodium (ALEVE) 220 MG tablet Take 220 mg by mouth.   rosuvastatin (CRESTOR) 10 MG tablet Take 1 tablet by mouth once daily   Semaglutide, 1 MG/DOSE, (OZEMPIC, 1 MG/DOSE,) 4 MG/3ML SOPN Inject 0.75 mLs (1 mg total) into the skin once a week.   venlafaxine XR (EFFEXOR-XR) 150 MG 24 hr capsule Take 1 capsule (150 mg total) by mouth 2 (two) times daily.   No facility-administered encounter medications on file as of 08/25/2020.   BP Readings from Last 3 Encounters:  05/30/20 120/78  02/04/20 130/82  10/04/19 120/88    Lab Results  Component Value Date   HGBA1C 5.9 (A) 05/30/2020     No OVs, Consults, or hospital visits since last care coordination call / Pharmacist visit.  Patient obtains medications through Adherence Packaging  30 Days   Patient is due  for next adherence delivery on: 09/03/2020  This delivery to include: Adherence Packaging  30 Days  Packs:   Crestor 10 mg- 1 tablet daily (1 breakfast)             Gabapentin 300 mg - 2 capsules at bedtime (2 Bedtime)             Metformin 1000 mg- 1 tablet twice daily (1 Breakfast, 1 Evening meal)             Venlafaxine ER 150 mg - 1 tablet twice daily (1 Breakfast, 1 Bedtime)             OTC Alpha Lipoic Acid 600 mg - 1 tablet twice daily (1 Breakfast, 1 Evening Meal)             OTC Biotin 1000 mcg- 2 tablet daily (2 Breakfast)             OTC Vitamin B12 500 mcg- 1 tablet daily (1 Breakfast)             OTC Vitamin D (25 mcg) 1000 IU- 1 tablet daily - (1 Breakfast)  Patient is not due for the following medications this month: Onetouch Ultrasoft lancets - Use to check BG twice daily Onetouch Verio Test Strips - Use to check BG twice daily Ozempic $RemoveBefor'1mg'kvgiomfCscmv$ /dose - Inject 1 mL once weekly (Mondays) gets from PAP Lantus U-100 Insulin 100 unit/ mL- 20-25 units daily-  Gets from PAP Insulin Syringe-Needle U-100 25g X5/8" 1 mL- Gets from PAP  No refill request needed from PCP.  Attempted contact with Dosia T Sattler 3 times on 08/25/20,08/26/20,08/27/20 Unsuccessful outreach. Will attempt contact next month.   Follow-Up:  Coordination of Enhanced Pharmacy Services and Pharmacist Review  Debbora Dus, CPP notified  [name], [place] Assistant [phone]  I have reviewed the care management and care coordination activities outlined in this encounter and I am certifying that I agree with the content of this note. No further action required.  Debbora Dus, PharmD Clinical Pharmacist [place] [facility] at [facility] [phone]

## 2020-08-27 NOTE — Telephone Encounter (Signed)
Called and unable to leave message for pt.Called pt's spouse and left a detailed message advising patient assistance Ozempic (9 boxes) has been delivered.

## 2020-08-29 NOTE — Telephone Encounter (Signed)
Lanny Hurst (husband) came and picked up medication on 08/29/2020 at 1:55pm

## 2020-09-09 ENCOUNTER — Ambulatory Visit (INDEPENDENT_AMBULATORY_CARE_PROVIDER_SITE_OTHER): Payer: PPO | Admitting: Internal Medicine

## 2020-09-09 ENCOUNTER — Other Ambulatory Visit: Payer: Self-pay

## 2020-09-09 ENCOUNTER — Encounter: Payer: Self-pay | Admitting: Internal Medicine

## 2020-09-09 VITALS — BP 108/68 | HR 90 | Temp 97.6°F | Ht 65.0 in | Wt 172.0 lb

## 2020-09-09 DIAGNOSIS — F331 Major depressive disorder, recurrent, moderate: Secondary | ICD-10-CM

## 2020-09-09 DIAGNOSIS — E785 Hyperlipidemia, unspecified: Secondary | ICD-10-CM

## 2020-09-09 DIAGNOSIS — R413 Other amnesia: Secondary | ICD-10-CM

## 2020-09-09 DIAGNOSIS — E1149 Type 2 diabetes mellitus with other diabetic neurological complication: Secondary | ICD-10-CM

## 2020-09-09 DIAGNOSIS — Z1211 Encounter for screening for malignant neoplasm of colon: Secondary | ICD-10-CM

## 2020-09-09 DIAGNOSIS — Z7189 Other specified counseling: Secondary | ICD-10-CM | POA: Diagnosis not present

## 2020-09-09 DIAGNOSIS — IMO0002 Reserved for concepts with insufficient information to code with codable children: Secondary | ICD-10-CM

## 2020-09-09 DIAGNOSIS — Z0000 Encounter for general adult medical examination without abnormal findings: Secondary | ICD-10-CM

## 2020-09-09 DIAGNOSIS — E1165 Type 2 diabetes mellitus with hyperglycemia: Secondary | ICD-10-CM

## 2020-09-09 LAB — COMPREHENSIVE METABOLIC PANEL
ALT: 20 U/L (ref 0–35)
AST: 28 U/L (ref 0–37)
Albumin: 4.5 g/dL (ref 3.5–5.2)
Alkaline Phosphatase: 82 U/L (ref 39–117)
BUN: 14 mg/dL (ref 6–23)
CO2: 28 mEq/L (ref 19–32)
Calcium: 10.4 mg/dL (ref 8.4–10.5)
Chloride: 100 mEq/L (ref 96–112)
Creatinine, Ser: 1.06 mg/dL (ref 0.40–1.20)
GFR: 53.68 mL/min — ABNORMAL LOW (ref >60.00)
Glucose, Bld: 92 mg/dL (ref 70–99)
Potassium: 3.8 mEq/L (ref 3.5–5.1)
Sodium: 143 mEq/L (ref 135–145)
Total Bilirubin: 1.2 mg/dL (ref 0.2–1.2)
Total Protein: 6.6 g/dL (ref 6.0–8.3)

## 2020-09-09 LAB — CBC
HCT: 43.3 % (ref 36.0–46.0)
Hemoglobin: 14.3 g/dL (ref 12.0–15.0)
MCHC: 33.1 g/dL (ref 30.0–36.0)
MCV: 86.8 fl (ref 78.0–100.0)
Platelets: 236 10*3/uL (ref 150.0–400.0)
RBC: 4.99 Mil/uL (ref 3.87–5.11)
RDW: 15.1 % (ref 11.5–15.5)
WBC: 11 10*3/uL — ABNORMAL HIGH (ref 4.0–10.5)

## 2020-09-09 LAB — SEDIMENTATION RATE: Sed Rate: 8 mm/hr (ref 0–30)

## 2020-09-09 LAB — T4, FREE: Free T4: 1.06 ng/dL (ref 0.60–1.60)

## 2020-09-09 LAB — LIPID PANEL
Cholesterol: 126 mg/dL (ref 0–200)
HDL: 27.2 mg/dL — ABNORMAL LOW (ref >39.00)
Total CHOL/HDL Ratio: 5
Triglycerides: 423 mg/dL — ABNORMAL HIGH (ref 0.0–149.0)

## 2020-09-09 LAB — VITAMIN B12: Vitamin B-12: 316 pg/mL (ref 211–911)

## 2020-09-09 LAB — HEMOGLOBIN A1C: Hgb A1c MFr Bld: 5.5 % (ref 4.6–6.5)

## 2020-09-09 LAB — HM DIABETES FOOT EXAM

## 2020-09-09 LAB — LDL CHOLESTEROL, DIRECT: Direct LDL: 42 mg/dL

## 2020-09-09 NOTE — Assessment & Plan Note (Signed)
No problems with statin 

## 2020-09-09 NOTE — Patient Instructions (Addendum)
Consider taking over the counter famotidine 20mg  every night to prevent heartburn.  You should consider using a bedside commode---so you don't fall on the way to the bathroom at night.  Please set up your screening mammogram.

## 2020-09-09 NOTE — Assessment & Plan Note (Signed)
See social history 

## 2020-09-09 NOTE — Progress Notes (Signed)
Subjective:    Patient ID: Jacqueline Cole, female    DOB: 09-14-1950, 70 y.o.   MRN: [id number]  HPI Here for Medicare wellness visit and follow up of chronic health conditions This visit occurred during the SARS-CoV-2 public health emergency.  Safety protocols were in place, including screening questions prior to the visit, additional usage of staff PPE, and extensive cleaning of exam room while observing appropriate contact time as indicated for disinfecting solutions.   Reviewed form and advanced directives Reviewed other doctors No alcohol or tobacco Not exercising--discussed Vision is stable--did go this year Hearing only slightly off Multiple falls---no major injuries, but cut hand and bruising Chronic depression Issues with ADLs and memory  Has lost about 70# on the semaglutide Does make her tired Tries to do leg and arm exercises when sitting "Gives out" if she tries to walk or do more  Checking sugars twice a day No hypoglycemic reactions Has been decreasing insulin Still on gabapentin at night---feet are "ice cold now" Balance is bad--uses cane/walker Now on assistance program for lantus and semaglutide (much relief)  Depression has been better Not gone--but improved Has been life long  Having some memory issues--forgets if she took meds (now using phone to track it) Has restarted cooking Doesn't drive Uses shower stool--can't stand up for long Husband helps her dressing/showering Will help slightly with housework--husband does most of it Will mix things up---like putting dirty clothes in the drier  No chest pain No palpitations Easy DOE--no SOB at rest No sig edema No dizziness or syncope--but occ vertigo  Current Outpatient Medications on File Prior to Visit  Medication Sig Dispense Refill   Blood Glucose Monitoring Suppl (ONETOUCH VERIO FLEX SYSTEM) w/Device KIT Use to check blood sugar 2 times a day. 1 kit 0   gabapentin (NEURONTIN) 300 MG capsule  Take 2 capsules (600 mg total) by mouth at bedtime. 180 capsule 3   glucose blood (ONETOUCH VERIO) test strip Use as instructed to check blood sugar 2 times a day. 200 each 12   ibuprofen (ADVIL,MOTRIN) 200 MG tablet Take 200 mg by mouth every 6 (six) hours as needed.     Insulin Syringe-Needle U-100 25G X 5/8" 1 ML MISC Use daily as directed 100 each 2   Lancets (ONETOUCH ULTRASOFT) lancets Use as instructed to check blood sugar 2 times a day. 200 each 12   LANTUS 100 UNIT/ML injection INJECT 25-50 UNITS SUBCUTANEOUSLY ONCE DAILY AT BEDTIME 30 mL 3   metFORMIN (GLUCOPHAGE) 1000 MG tablet TAKE ONE TABLET BY MOUTH TWICE DAILY 180 tablet 2   naproxen sodium (ALEVE) 220 MG tablet Take 220 mg by mouth.     rosuvastatin (CRESTOR) 10 MG tablet Take 1 tablet by mouth once daily 90 tablet 1   Semaglutide, 1 MG/DOSE, (OZEMPIC, 1 MG/DOSE,) 4 MG/3ML SOPN Inject 0.75 mLs (1 mg total) into the skin once a week. 9 mL 3   venlafaxine XR (EFFEXOR-XR) 150 MG 24 hr capsule Take 1 capsule (150 mg total) by mouth 2 (two) times daily. 180 capsule 0   No current facility-administered medications on file prior to visit.    Allergies  Allergen Reactions   Onglyza [Saxagliptin] Hives and Nausea And Vomiting   Liraglutide Nausea Only    Can't eat, nausea, higher sugars    Past Medical History:  Diagnosis Date   Depression    Diabetes mellitus type II    Hyperlipemia    Major depressive disorder, recurrent, in remission (HCC) 09/01/2006  Qualifier: Diagnosis of  By: Lelon Mast     Obesity    Osteopenia    Syncope 1998   DM diagnosis    Past Surgical History:  Procedure Laterality Date   ABDOMINAL HYSTERECTOMY  ~2005   and BSO   BREAST BIOPSY Right 01/05/2019   rt stereo bx 2 areas 1st area ribbon clip   BREAST BIOPSY Right 01/05/2019   rt stereo bx 2nd area coil clip   [place]     Injury Right face as a child   VAGINAL DELIVERY     X 2    Family History   Problem Relation Age of Onset   Stroke Father        CVA   Heart disease Father        MI   Cancer Sister        breast cancer   Breast cancer Sister 36   Heart disease Brother        MI   Cancer Brother        Lung    Social History   Socioeconomic History   Marital status: Married    Spouse name: Not on file   Number of children: 2   Years of education: Not on file   Highest education level: Not on file  Occupational History    Comment: "Just up and quit" at [employer] Use   Smoking status: Former    Pack years: 0.00    Types: Cigarettes    Quit date: 03/15/1993    Years since quitting: 27.5   Smokeless tobacco: Never  Substance and Sexual Activity   Alcohol use: No    Alcohol/week: 0.0 standard drinks   Drug use: No   Sexual activity: Not on file  Other Topics Concern   Not on file  Social History Narrative   Has living will   Husband, then daughter [name], have health care POA   Would reluctantly accept resuscitation but no life prolonging measures   Social Determinants of Health   Financial Resource Strain: Low Risk    Difficulty of Paying Living Expenses: Not very hard  Food Insecurity: Not on file  Transportation Needs: Not on file  Physical Activity: Not on file  Stress: Not on file  Social Connections: Not on file  Intimate Partner Violence: Not on file   Review of Systems Appetite is way down on semaglutide--tries to be careful Sleeps great Wears seat belt Full dentures--no dentist No suspicious skin lesions Heartburn with gabapentin (she thinks)----at night. No dysphagia. Uses pepcid prn No sig back or joint pains Bowels move well--no blood Voids well. Occasional urge incontinence at night    Objective:   Physical Exam Constitutional:      Appearance: Normal appearance.  HENT:     Mouth/Throat:     Comments: No lesions Eyes:     Conjunctiva/sclera: Conjunctivae normal.     Pupils: Pupils are equal, round, and reactive to  light.  Cardiovascular:     Rate and Rhythm: Normal rate and regular rhythm.     Pulses: Normal pulses.     Heart sounds: No murmur heard.   No gallop.  Pulmonary:     Effort: Pulmonary effort is normal.     Breath sounds: Normal breath sounds. No wheezing or rales.  Abdominal:     Palpations: Abdomen is soft.     Tenderness: There is no abdominal tenderness.  Musculoskeletal:  Cervical back: Neck supple.     Right lower leg: No edema.     Left lower leg: No edema.  Lymphadenopathy:     Cervical: No cervical adenopathy.  Skin:    General: Skin is warm.     Findings: No rash.     Comments: No foot lesions  Neurological:     Mental Status: She is alert and oriented to person, place, and time.     Comments: President--- "Biden, Trump, ?" 100-93- "I need pencil and paper"--84-? [id number] Recall 3/3  Decreased sensation in feet  Psychiatric:        Mood and Affect: Mood normal.        Behavior: Behavior normal.           Assessment & Plan:

## 2020-09-09 NOTE — Assessment & Plan Note (Signed)
Lifelong symptoms Partial remission on venlafaxine---should never stop

## 2020-09-09 NOTE — Assessment & Plan Note (Signed)
Now excellent control with weight loss on semaglutide Also metformin, lantus Gabapentin for neuropathy

## 2020-09-09 NOTE — Assessment & Plan Note (Signed)
I have personally reviewed the Medicare Annual Wellness questionnaire and have noted 1. The patient's medical and social history 2. Their use of alcohol, tobacco or illicit drugs 3. Their current medications and supplements 4. The patient's functional ability including ADL's, fall risks, home safety risks and hearing or visual             impairment. 5. Diet and physical activities 6. Evidence for depression or mood disorders  The patients weight, height, BMI and visual acuity have been recorded in the chart I have made referrals, counseling and provided education to the patient based review of the above and I have provided the pt with a written personalized care plan for preventive services.  I have provided you with a copy of your personalized plan for preventive services. Please take the time to review along with your updated medication list.  She agrees to do FIT Not happy after last mammogram---urged her to find another place Needs COVID second booster soon Flu vaccine in the fall Needs to do some exercise Consider shingrix at pharmacy

## 2020-09-09 NOTE — Assessment & Plan Note (Signed)
Will check labs Will have husband come in with her soon to reevaluate

## 2020-09-21 ENCOUNTER — Other Ambulatory Visit: Payer: Self-pay | Admitting: Internal Medicine

## 2020-09-24 ENCOUNTER — Telehealth: Payer: Self-pay

## 2020-09-24 NOTE — Chronic Care Management (AMB) (Addendum)
Chronic Care Management Pharmacy Assistant   Name: Jacqueline Cole  MRN: [id number] DOB: [birth year]  Reason for Encounter: Medication Adherence and Delivery Coordination   Recent office visits:  09/09/20 Arvilla Market, PCP - [facility], continue current medications   Recent consult visits:  None since last CCM contact  Hospital visits:  None in previous 6 months  Medications: Outpatient Encounter Medications as of 09/24/2020  Medication Sig   Blood Glucose Monitoring Suppl ([place]) w/Device KIT Use to check blood sugar 2 times a day.   gabapentin (NEURONTIN) 300 MG capsule Take 2 capsules (600 mg total) by mouth at bedtime.   glucose blood (ONETOUCH VERIO) test strip Use as instructed to check blood sugar 2 times a day.   ibuprofen (ADVIL,MOTRIN) 200 MG tablet Take 200 mg by mouth every 6 (six) hours as needed.   Insulin Syringe-Needle U-100 25G X 5/8" 1 ML MISC Use daily as directed   Lancets (ONETOUCH ULTRASOFT) lancets Use as instructed to check blood sugar 2 times a day.   LANTUS 100 UNIT/ML injection INJECT 25-50 UNITS SUBCUTANEOUSLY ONCE DAILY AT BEDTIME   metFORMIN (GLUCOPHAGE) 1000 MG tablet TAKE ONE TABLET BY MOUTH TWICE DAILY   naproxen sodium (ALEVE) 220 MG tablet Take 220 mg by mouth.   rosuvastatin (CRESTOR) 10 MG tablet TAKE ONE TABLET BY MOUTH EVERY MORNING   Semaglutide, 1 MG/DOSE, (OZEMPIC, 1 MG/DOSE,) 4 MG/3ML SOPN Inject 0.75 mLs (1 mg total) into the skin once a week.   venlafaxine XR (EFFEXOR-XR) 150 MG 24 hr capsule Take 1 capsule (150 mg total) by mouth 2 (two) times daily.   No facility-administered encounter medications on file as of 09/24/2020.    BP Readings from Last 3 Encounters:  09/09/20 108/68  05/30/20 120/78  02/04/20 130/82    Lab Results  Component Value Date   HGBA1C 5.5 09/09/2020    Patient obtains medications through Adherence Packaging  30 Days   Last adherence delivery date:  09/03/2020     Patient is due for next  adherence delivery on: 10/06/2020  Multiple attempts made to reach patient on 09/24/20,09/25/20,09/26/20. Unsuccessful outreach. Will refill based off of last adherence fill.   This delivery to include: Adherence Packaging  30 Days   Packs: Rosuvastatin 10mg  take 1 tablet breakfast Gabapentin 300mg  take 2 tablets bedtime  Metformin 1000mg  take 1 tablet breakfast 1 tablet evening meal  Venlafaxine ER 150mg   take 1 tablet breakfast and 1 tablet bedtime Biotin 1000mg  take 2 tablets breakfast Vitamin B12 541mcg take 1 tablet breakfast Vitamin D3 21mcg  take 1 tablet breakfast Alpha Lipoic Acid 600mg  take 1 tablet breakfast and 1 tablet evening meal  PRN/VIAL medications: None  Patient will not be sent the following medications this month: Onetouch Ultrasoft lancets - Use to check BG twice daily Onetouch Verio Test Strips - Use to check BG twice daily Ozempic 1mg /dose - Inject 1 mL once weekly (Mondays) gets from PAP Lantus U-100 Insulin 100 unit/ mL- 20-25 units daily- gets from PAP Insulin Syringe-Needle U-100 25g X5/8" 1 mL- gets from PAP  No refill request needed from PCP.  Confirmed delivery date of 10/06/2020, pharmacy will contact her the morning of delivery.  Follow-Up:  Coordination of Enhanced Pharmacy Services and Pharmacist Review  Debbora Dus, CPP notified  [name], [place] Assistant [phone]  I have reviewed the care management and care coordination activities outlined in this encounter and I am certifying that I agree with the content  of this note. Pt missed last CCM visit, rescheduled for August 2022.  Debbora Dus, PharmD Clinical Pharmacist [facility] at [facility] [phone]

## 2020-10-01 NOTE — Telephone Encounter (Signed)
Called and left message for pt's husband Lanny Hurst) advising 2 boxes of Lantus has been delivered here at the office and is ready for pick up.

## 2020-10-24 ENCOUNTER — Telehealth: Payer: Self-pay

## 2020-10-24 NOTE — Chronic Care Management (AMB) (Addendum)
Chronic Care Management Pharmacy Assistant   Name: Jacqueline Cole  MRN: [id number] DOB: 29-May-1950   Reason for Encounter: Medication Adherence and Delivery Coordination    Recent office visits:  None since the last CCM contact  Recent consult visits:  None since the last CCM contact  Hospital visits:  None in previous 6 months  Medications: Outpatient Encounter Medications as of 10/24/2020  Medication Sig   Blood Glucose Monitoring Suppl ([place]) w/Device KIT Use to check blood sugar 2 times a day.   gabapentin (NEURONTIN) 300 MG capsule Take 2 capsules (600 mg total) by mouth at bedtime.   glucose blood (ONETOUCH VERIO) test strip Use as instructed to check blood sugar 2 times a day.   ibuprofen (ADVIL,MOTRIN) 200 MG tablet Take 200 mg by mouth every 6 (six) hours as needed.   Insulin Syringe-Needle U-100 25G X 5/8" 1 ML MISC Use daily as directed   Lancets (ONETOUCH ULTRASOFT) lancets Use as instructed to check blood sugar 2 times a day.   LANTUS 100 UNIT/ML injection INJECT 25-50 UNITS SUBCUTANEOUSLY ONCE DAILY AT BEDTIME   metFORMIN (GLUCOPHAGE) 1000 MG tablet TAKE ONE TABLET BY MOUTH TWICE DAILY   naproxen sodium (ALEVE) 220 MG tablet Take 220 mg by mouth.   rosuvastatin (CRESTOR) 10 MG tablet TAKE ONE TABLET BY MOUTH EVERY MORNING   Semaglutide, 1 MG/DOSE, (OZEMPIC, 1 MG/DOSE,) 4 MG/3ML SOPN Inject 0.75 mLs (1 mg total) into the skin once a week.   venlafaxine XR (EFFEXOR-XR) 150 MG 24 hr capsule Take 1 capsule (150 mg total) by mouth 2 (two) times daily.   No facility-administered encounter medications on file as of 10/24/2020.   BP Readings from Last 3 Encounters:  09/09/20 108/68  05/30/20 120/78  02/04/20 130/82    Lab Results  Component Value Date   HGBA1C 5.5 09/09/2020      No OVs, Consults, or hospital visits since last care coordination call / Pharmacist visit. No medication changes indicated   Last adherence delivery  date:10/06/2020      Patient is due for next adherence delivery on: 11/06/2020  Multiple attempts made to reach patient on 10/24/20,10/27/20 am 10/27/20 pm. Unsuccessful outreach. Will refill based off of last adherence fill.   This delivery to include: Adherence Packaging  30 Days  Packs: Rosuvastatin 57m take 1 tablet breakfast Gabapentin 301mtake 2 tablets bedtime  Metformin 100013make 1 tablet breakfast 1 tablet evening meal  Venlafaxine ER 150m52make 1 tablet breakfast and 1 tablet bedtime Biotin 1000mg52me 2 tablets breakfast Vitamin B12 500mcg32me 1 tablet breakfast Vitamin D3 25mcg 26me 1 tablet breakfast Alpha Lipoic Acid 600mg ta22m tablet breakfast and 1 tablet evening meal  VIAL medications: none   Patient declined the following medications this month: Onetouch Ultrasoft lancets - Use to check BG twice daily Onetouch Verio Test Strips - Use to check BG twice daily Ozempic 1mg/dose58mInject 1 mL once weekly (Mondays) gets from PAP Lantus U-100 Insulin 100 unit/ mL- 20-25 units daily- gets from PAP Insulin Syringe-Needle U-100 25g X5/8" 1 mL- gets from PAP    Refills requested from PCP include: Venlafaxine ER   Delivery scheduled for 11/06/2020. Unable to speak with patient to confirm date.    Jacqueline Cole Dusified  Jacqueline Cole HAvel [place] [phone] the care management and care coordination activities outlined in this encounter and I am certifying that I agree with  the content of this note. No further action required.  Jacqueline Cole, PharmD Clinical Pharmacist [facility] at [facility] [phone]

## 2020-10-27 ENCOUNTER — Other Ambulatory Visit: Payer: Self-pay | Admitting: Internal Medicine

## 2020-10-27 ENCOUNTER — Telehealth: Payer: Self-pay

## 2020-10-27 NOTE — Chronic Care Management (AMB) (Addendum)
    Chronic Care Management Pharmacy Assistant   Name: Jacqueline Cole  MRN: [id number] DOB: [birth year]  Reason for Encounter: CCM Reminder Call   Conditions : DM type 2, Hyperlipidemia  Medications: Outpatient Encounter Medications as of 10/27/2020  Medication Sig   Blood Glucose Monitoring Suppl (ONETOUCH VERIO FLEX SYSTEM) w/Device KIT Use to check blood sugar 2 times a day.   gabapentin (NEURONTIN) 300 MG capsule Take 2 capsules (600 mg total) by mouth at bedtime.   glucose blood (ONETOUCH VERIO) test strip Use as instructed to check blood sugar 2 times a day.   ibuprofen (ADVIL,MOTRIN) 200 MG tablet Take 200 mg by mouth every 6 (six) hours as needed.   Insulin Syringe-Needle U-100 25G X 5/8" 1 ML MISC Use daily as directed   Lancets (ONETOUCH ULTRASOFT) lancets Use as instructed to check blood sugar 2 times a day.   LANTUS 100 UNIT/ML injection INJECT 25-50 UNITS SUBCUTANEOUSLY ONCE DAILY AT BEDTIME   metFORMIN (GLUCOPHAGE) 1000 MG tablet TAKE ONE TABLET BY MOUTH TWICE DAILY   naproxen sodium (ALEVE) 220 MG tablet Take 220 mg by mouth.   rosuvastatin (CRESTOR) 10 MG tablet TAKE ONE TABLET BY MOUTH EVERY MORNING   Semaglutide, 1 MG/DOSE, (OZEMPIC, 1 MG/DOSE,) 4 MG/3ML SOPN Inject 0.75 mLs (1 mg total) into the skin once a week.   venlafaxine XR (EFFEXOR-XR) 150 MG 24 hr capsule Take 1 capsule (150 mg total) by mouth 2 (two) times daily.   No facility-administered encounter medications on file as of 10/27/2020.     Recent Relevant Labs: Lab Results  Component Value Date/Time   HGBA1C 5.5 09/09/2020 10:06 AM   HGBA1C 5.9 (A) 05/30/2020 09:01 AM   HGBA1C 6.0 (A) 02/04/2020 10:08 AM   HGBA1C 8.2 (H) 10/13/2016 12:13 PM   MICROALBUR 4.0 (H) 11/26/2015 09:58 AM   MICROALBUR 0.8 09/08/2012 09:36 AM    Kidney Function Lab Results  Component Value Date/Time   CREATININE 1.06 09/09/2020 10:06 AM   CREATININE 1.10 04/24/2019 09:30 AM   GFR 53.68 (L) 09/09/2020 10:06 AM   GFRNONAA  53.55 12/30/2009 09:12 AM   GFRAA 66 10/23/2007 11:18 AM    Jacqueline Cole did not answer the telephone to remind her of her upcoming telephone visit with Debbora Dus on 11/03/2020 at 8:30am. Patient was reminded to have all medications, supplements and any blood glucose and blood pressure readings available for review at appointment. Detailed voicemail left for patient.  Star Rating Drugs: Medication:  Last Fill: Day Supply Rosuvastatin 10mg  09/29/20 30 Metformin 1000mg  09/29/20 30 Lantus   05/13/20  20  PAP Ozempic  05/20/20  28  PAP    Debbora Dus, CPP notified  [name], [place] Assistant [phone]  I have reviewed the care management and care coordination activities outlined in this encounter and I am certifying that I agree with the content of this note. No further action required.  Debbora Dus, PharmD Clinical Pharmacist [name] at [facility] [phone]

## 2020-10-30 ENCOUNTER — Telehealth: Payer: Self-pay

## 2020-10-30 ENCOUNTER — Emergency Department (HOSPITAL_COMMUNITY): Payer: PPO

## 2020-10-30 ENCOUNTER — Other Ambulatory Visit: Payer: Self-pay

## 2020-10-30 ENCOUNTER — Emergency Department (HOSPITAL_COMMUNITY)
Admission: EM | Admit: 2020-10-30 | Discharge: 2020-10-30 | Discharge status: Against Medical Advice | Payer: PPO | Attending: Student | Admitting: Student

## 2020-10-30 DIAGNOSIS — R112 Nausea with vomiting, unspecified: Secondary | ICD-10-CM | POA: Insufficient documentation

## 2020-10-30 DIAGNOSIS — R5383 Other fatigue: Secondary | ICD-10-CM | POA: Diagnosis not present

## 2020-10-30 DIAGNOSIS — E119 Type 2 diabetes mellitus without complications: Secondary | ICD-10-CM | POA: Diagnosis not present

## 2020-10-30 DIAGNOSIS — Z5321 Procedure and treatment not carried out due to patient leaving prior to being seen by health care provider: Secondary | ICD-10-CM | POA: Diagnosis not present

## 2020-10-30 LAB — CBC WITH DIFFERENTIAL/PLATELET
Abs Immature Granulocytes: 0.06 10*3/uL (ref 0.00–0.07)
Basophils Absolute: 0 10*3/uL (ref 0.0–0.1)
Basophils Relative: 0 %
Eosinophils Absolute: 0 10*3/uL (ref 0.0–0.5)
Eosinophils Relative: 0 %
HCT: 41.3 % (ref 36.0–46.0)
Hemoglobin: 14 g/dL (ref 12.0–15.0)
Immature Granulocytes: 1 %
Lymphocytes Relative: 20 %
Lymphs Abs: 2.1 10*3/uL (ref 0.7–4.0)
MCH: 29.5 pg (ref 26.0–34.0)
MCHC: 33.9 g/dL (ref 30.0–36.0)
MCV: 86.9 fL (ref 80.0–100.0)
Monocytes Absolute: 1 10*3/uL (ref 0.1–1.0)
Monocytes Relative: 10 %
Neutro Abs: 7.1 10*3/uL (ref 1.7–7.7)
Neutrophils Relative %: 69 %
Platelets: 243 10*3/uL (ref 150–400)
RBC: 4.75 MIL/uL (ref 3.87–5.11)
RDW: 14.5 % (ref 11.5–15.5)
WBC: 10.3 10*3/uL (ref 4.0–10.5)
nRBC: 0 % (ref 0.0–0.2)

## 2020-10-30 LAB — COMPREHENSIVE METABOLIC PANEL
ALT: 13 U/L (ref 0–44)
AST: 22 U/L (ref 15–41)
Albumin: 3.3 g/dL — ABNORMAL LOW (ref 3.5–5.0)
Alkaline Phosphatase: 69 U/L (ref 38–126)
Anion gap: 23 — ABNORMAL HIGH (ref 5–15)
BUN: 13 mg/dL (ref 8–23)
CO2: 23 mmol/L (ref 22–32)
Calcium: 9.3 mg/dL (ref 8.9–10.3)
Chloride: 91 mmol/L — ABNORMAL LOW (ref 98–111)
Creatinine, Ser: 1.51 mg/dL — ABNORMAL HIGH (ref 0.44–1.00)
GFR, Estimated: 37 mL/min — ABNORMAL LOW (ref >60)
Glucose, Bld: 211 mg/dL — ABNORMAL HIGH (ref 70–99)
Potassium: 2.6 mmol/L — CL (ref 3.5–5.1)
Sodium: 137 mmol/L (ref 135–145)
Total Bilirubin: 3.4 mg/dL — ABNORMAL HIGH (ref 0.3–1.2)
Total Protein: 5.8 g/dL — ABNORMAL LOW (ref 6.5–8.1)

## 2020-10-30 LAB — LIPASE, BLOOD: Lipase: 33 U/L (ref 11–51)

## 2020-10-30 NOTE — Telephone Encounter (Signed)
Please see note below access nurse note where have already spoke with pts husband.

## 2020-10-30 NOTE — ED Provider Notes (Signed)
Emergency Medicine Provider Triage Evaluation Note  Jacqueline Cole , a 70 y.o. female  was evaluated in triage.  Pt complains of nausea and vomiting x1 week.  Patient reports she is feeling weak, lethargic at home.  She sleeps most of the day.  She denies any aggravating factor.  She also has a conjunctivitis to treat with eyedrops, she is unable to name the medicine..  Review of Systems  Positive: Weakness, decreased appetite, nausea, vomiting Negative: Chest pain, shortness of breath  Physical Exam  There were no vitals taken for this visit. Gen:   Awake, no distress/patient has slow mentation unclear if this is baseline Resp:  Normal effort  MSK:   Moves extremities without difficulty  Other:    Medical Decision Making  Medically screening exam initiated at 11:18 AM.  Appropriate orders placed.  Alaney T Galasso was informed that the remainder of the evaluation will be completed by another provider, this initial triage assessment does not replace that evaluation, and the importance of remaining in the ED until their evaluation is complete.     Sherrill Raring, PA-C 10/30/20 1119    Valarie Merino, MD 11/01/20 [phone]

## 2020-10-30 NOTE — Telephone Encounter (Signed)
Jacqueline Carbon, MD to Pilar Grammes, CMA     10:49 AM  It definitely sounds like she needs immediate labs and likely at least some IV fluids.  Will await the report from her ER visit       I spoke with pts husband and I spoke with [place] ED; the wait time at [facility] ED now is 20 - 30 mins. Pt husband said the pt has gone to sleep and will wait and go in middle of night or tomorrow; pts husband notified of Dr Everardo Beals instruction and voiced understanding but said he knows his wife and she will not go until tomorrow. Sending note to DR Silvio Pate and Larene Beach CMA. Also sending teams to [place].

## 2020-10-30 NOTE — Telephone Encounter (Signed)
Pt husband called back they have left the

## 2020-10-30 NOTE — Telephone Encounter (Signed)
Pts husband (DPR signed) said that pt has not been able to eat in 3-4 days due to vomiting. Pt is drinking small amt of water.Pt is diabetic and unable to take meds, pt is weak and unable to walk due to fatigue and weakness; pt cannot walk from bedroom to living room. This morning FBS 135. Pt has had watery diarrhea for 1 wk; pt has dry mouth, abd pain "all over" that goes from dull to sharpe pain that is continuous. Pt is not sleeping at night. Pt is lightheaded. Pt has been losing wt also. Pt has not been able to take her depression meds also and pts husband said "pt can be incoherent at times". Mr Marsiglia declines 911 and said he can get his wife in car with a w/c. Pt is going to take pt to [facility] ED now. Sending note to Dr Silvio Pate who will be in office this afternoon. Sending note to [facility] CMA also.

## 2020-10-30 NOTE — Telephone Encounter (Signed)
Pt husband call back they have left the ED because of the wait time. They wanted to know what the next step was for treatment since the ED was to long

## 2020-10-30 NOTE — ED Triage Notes (Signed)
Pt with n/v and fatigue x 1 week. Denies abdominal pain. Hx DM2.

## 2020-10-30 NOTE — ED Notes (Signed)
Pt left, was advised to stay but did not

## 2020-10-30 NOTE — Telephone Encounter (Signed)
[place] Day - Client TELEPHONE ADVICE RECORD AccessNurse Patient Name: Jacqueline Cole Gender: Female DOB: Jul 26, 1950 Age: 70 Y 87 M 7 D Return Phone Number: [phone] (Primary), [phone] (Secondary) Address[place] [address] Client [place] Day - Client Client Site [place] Physician [name]- MD Contact Type Call Who Is Calling Patient / Member / Family / Caregiver Call Type Triage / Clinical Caller Name [name] Relationship To Patient Spouse Return Phone Number [phone] (Primary) Chief Complaint Swallowing Difficulty Reason for Call Symptomatic / Request for [place] with [name], has a Pt who needs to be triaged. States his wife has not eaten in 3-4 days weak, is very fatigued and unable to walk, she is a diabetic and has not been able to take her medication. Pts husband states his wife has not eaten in 3-4 days, when she tries to she vomits. She can not swallow her medication. Is also in pain at night and does not sleep well. Has an apt on the 28th or the 29th but feels she needs to be seen sooner. Blood sugar reading is 135. Translation No Disp. Time [name] Time) Disposition Final User 10/30/2020 8:30:18 AM Attempt made - message left Altamease Oiler RN, [name] 10/30/2020 8:44:51 AM Attempt made - message left Altamease Oiler RN, [name] 10/30/2020 8:56:08 AM FINAL ATTEMPT MADE - message left Yes Altamease Oiler, RN, [name]

## 2020-11-03 ENCOUNTER — Telehealth: Payer: PPO

## 2020-11-03 ENCOUNTER — Telehealth: Payer: Self-pay

## 2020-11-03 ENCOUNTER — Ambulatory Visit: Payer: Self-pay

## 2020-11-03 DIAGNOSIS — E1165 Type 2 diabetes mellitus with hyperglycemia: Secondary | ICD-10-CM

## 2020-11-03 DIAGNOSIS — IMO0002 Reserved for concepts with insufficient information to code with codable children: Secondary | ICD-10-CM

## 2020-11-03 DIAGNOSIS — F331 Major depressive disorder, recurrent, moderate: Secondary | ICD-10-CM

## 2020-11-03 NOTE — Telephone Encounter (Signed)
Left detailed message on pt's husband VM

## 2020-11-03 NOTE — Telephone Encounter (Signed)
Contacted patient for CCM visit today. Pt still not feeling well and unable to come to the phone. Spoke with her husband. She has not eaten since last office contact (see triage note 8/18). She has increased water intake. No vomiting today. Still very weak and has had several falls. Husband would like her to be seen by PCP. Will forward to triage.  Debbora Dus, PharmD Clinical Pharmacist [facility] at [facility] [phone]

## 2020-11-03 NOTE — Telephone Encounter (Signed)
Added this note to previous ongoing note about her symptoms.

## 2020-11-03 NOTE — Telephone Encounter (Signed)
Pt did go to ER as advised 10-30-20. Wants to know what Dr Silvio Pate wants to do. They would like to come see him if possible.

## 2020-11-03 NOTE — Telephone Encounter (Signed)
Debbora Dus, [facility]     8:39 AM Note Contacted patient for CCM visit today. Pt still not feeling well and unable to come to the phone. Spoke with her husband. She has not eaten since last office contact (see triage note 8/18). She has increased water intake. No vomiting today. Still very weak and has had several falls. Husband would like her to be seen by PCP. Will forward to triage.   Debbora Dus, PharmD Clinical Pharmacist [facility] at [facility] [phone]

## 2020-11-03 NOTE — Progress Notes (Signed)
   Chronic Care Management Outreach Note  11/03/2020 Name:  Jacqueline Cole MRN:  [id number] DOB:  Jan 16, 1951  Subjective: Jacqueline Cole is an 70 y.o. year old female who is a primary patient of Letvak, Theophilus Kinds, MD.  The CCM team was consulted for assistance with disease management and care coordination needs.    Engaged with patient by telephone for follow up visit in response to provider referral for pharmacy case management and/or care coordination services. Spoke with spouse. Patient still not feeling well and unable to complete appt today. See telephone note to triage. Rescheduled visit for Nov 2022.  Consent to Services:  The patient was given information about Chronic Care Management services, agreed to services, and gave verbal consent prior to initiation of services.  Please see initial visit note for detailed documentation.   Patient Care Team: Venia Carbon, MD as PCP - [name], [facility] as Pharmacist (Pharmacist)  Recent office visits: 09/09/20 - PCP - Pt presented for AWV. Consider famotidine OTC. Schedule mammogram.  Recent consult visits: None in previous 6 months  Hospital visits: 8/18 - ED - Patient presented for N/V. Left prior to evaluation.   Time spent in chart review prior to appt: 10 min. Follow Up: Nov 2022  Debbora Dus, PharmD Clinical Pharmacist [facility] at [facility] [phone]

## 2020-11-03 NOTE — Progress Notes (Deleted)
Chronic Care Management Pharmacy Note  11/03/2020 Name:  Jacqueline Cole MRN:  [id number] DOB:  1950-05-09  Summary: ***  Recommendations/Changes made from today's visit: ***  Plan: ***   Subjective: Jacqueline Cole is an 70 y.o. year old female who is a primary patient of Venia Carbon, MD.  The CCM team was consulted for assistance with disease management and care coordination needs.    Engaged with patient by telephone for follow up visit in response to provider referral for pharmacy case management and/or care coordination services.   Consent to Services:  The patient was given information about Chronic Care Management services, agreed to services, and gave verbal consent prior to initiation of services.  Please see initial visit note for detailed documentation.   Patient Care Team: Venia Carbon, MD as PCP - [name], [facility] as Pharmacist (Pharmacist)  Recent office visits: 09/09/20 - PCP - Pt presented for AWV. Consider famotidine OTC. Schedule mammogram.  Recent consult visits: None in previous 6 months  Hospital visits: None in previous 6 months   Objective:  Lab Results  Component Value Date   CREATININE 1.51 (H) 10/30/2020   BUN 13 10/30/2020   GFR 53.68 (L) 09/09/2020   GFRNONAA 37 (L) 10/30/2020   GFRAA 66 10/23/2007   NA 137 10/30/2020   K 2.6 (LL) 10/30/2020   CALCIUM 9.3 10/30/2020   CO2 23 10/30/2020   GLUCOSE 211 (H) 10/30/2020    Lab Results  Component Value Date/Time   HGBA1C 5.5 09/09/2020 10:06 AM   HGBA1C 5.9 (A) 05/30/2020 09:01 AM   HGBA1C 6.0 (A) 02/04/2020 10:08 AM   HGBA1C 8.2 (H) 10/13/2016 12:13 PM   GFR 53.68 (L) 09/09/2020 10:06 AM   GFR 49.41 (L) 04/24/2019 09:30 AM   MICROALBUR 4.0 (H) 11/26/2015 09:58 AM   MICROALBUR 0.8 09/08/2012 09:36 AM    Last diabetic Eye exam:  Lab Results  Component Value Date/Time   HMDIABEYEEXA Retinopathy (A) 04/25/2017 12:00 AM    Last diabetic Foot exam:  Lab  Results  Component Value Date/Time   HMDIABFOOTEX done 09/09/2020 12:00 AM     Lab Results  Component Value Date   CHOL 126 09/09/2020   HDL 27.20 (L) 09/09/2020   LDLCALC 46 06/07/2019   LDLDIRECT 42.0 09/09/2020   TRIG (H) 09/09/2020    423.0 Triglyceride is over 400; calculations on Lipids are invalid.   CHOLHDL 5 09/09/2020    Hepatic Function Latest Ref Rng & Units 10/30/2020 09/09/2020 04/24/2019  Total Protein 6.5 - 8.1 g/dL 5.8(L) 6.6 6.9  Albumin 3.5 - 5.0 g/dL 3.3(L) 4.5 4.3  AST 15 - 41 U/L _0 ALT 0 - 44 U/L _1 Alk Phosphatase 38 - 126 U/L 69 82 74  Total Bilirubin 0.3 - 1.2 mg/dL 3.4(H) 1.2 0.9  Bilirubin, Direct 0.0 - 0.3 mg/dL - - -    Lab Results  Component Value Date/Time   TSH 3.85 02/01/2018 08:51 AM   TSH 2.54 09/08/2012 09:36 AM   FREET4 1.06 09/09/2020 10:06 AM   FREET4 0.76 02/15/2018 10:02 AM    CBC Latest Ref Rng & Units 10/30/2020 09/09/2020 04/24/2019  WBC 4.0 - 10.5 K/uL 10.3 11.0(H) 9.9  Hemoglobin 12.0 - 15.0 g/dL 14.0 14.3 13.8  Hematocrit 36.0 - 46.0 % 41.3 43.3 41.5  Platelets 150 - 400 K/uL 243 236.0 276.0    No results found for: VD25OH  Clinical ASCVD: No  The ASCVD Risk  score Mikey Bussing DC Jr., et al., 2013) failed to calculate for the following reasons:   The valid total cholesterol range is 130 to 320 mg/dL     Social History   Tobacco Use  Smoking Status Former   Types: Cigarettes   Quit date: 03/15/1993   Years since quitting: 27.6  Smokeless Tobacco Never   BP Readings from Last 3 Encounters:  10/30/20 121/78  09/09/20 108/68  05/30/20 120/78   Pulse Readings from Last 3 Encounters:  10/30/20 94  09/09/20 90  05/30/20 93   Wt Readings from Last 3 Encounters:  09/09/20 172 lb (78 kg)  05/30/20 190 lb 12.8 oz (86.5 kg)  02/04/20 198 lb 3.2 oz (89.9 kg)   BMI Readings from Last 3 Encounters:  09/09/20 28.62 kg/m  05/30/20 31.75 kg/m  02/04/20 32.98 kg/m    Assessment/Interventions: Review of patient  past medical history, allergies, medications, health status, including review of consultants reports, laboratory and other test data, was performed as part of comprehensive evaluation and provision of chronic care management services.   SDOH:  (Social Determinants of Health) assessments and interventions performed: Yes  SDOH Screenings   Alcohol Screen: Not on file  Depression (PHQ2-9): Not on file  Financial Resource Strain: Low Risk    Difficulty of Paying Living Expenses: Not very hard  Food Insecurity: Not on file  Housing: Not on file  Physical Activity: Not on file  Social Connections: Not on file  Stress: Not on file  Tobacco Use: Medium Risk   Smoking Tobacco Use: Former   Smokeless Tobacco Use: Never  Transportation Needs: Not on file    [place]  Allergies  Allergen Reactions   Onglyza [Saxagliptin] Hives and Nausea And Vomiting   Liraglutide Nausea Only    Can't eat, nausea, higher sugars    Medications Reviewed Today     Reviewed by Venia Carbon, MD (Physician) on 09/09/20 at [place] List Status: <None>   Medication Order Taking? Sig Documenting Provider Last Dose Status Informant  Blood Glucose Monitoring Suppl (ONETOUCH VERIO FLEX SYSTEM) w/Device KIT [id number] Yes Use to check blood sugar 2 times a day. Philemon Kingdom, MD Taking Active   gabapentin (NEURONTIN) 300 MG capsule [id number] Yes Take 2 capsules (600 mg total) by mouth at bedtime. Venia Carbon, MD Taking Active   glucose blood [facility] VERIO) test strip [id number] Yes Use as instructed to check blood sugar 2 times a day. Philemon Kingdom, MD Taking Active   ibuprofen (ADVIL,MOTRIN) 200 MG tablet [id number] Yes Take 200 mg by mouth every 6 (six) hours as needed. [provider] Taking Active   Insulin Syringe-Needle U-100 25G X 5/8" 1 ML MISC [id number] Yes Use daily as directed Philemon Kingdom, MD Taking Active   Lancets ([place]) lancets [id number] Yes Use as  instructed to check blood sugar 2 times a day. Philemon Kingdom, MD Taking Active   LANTUS 100 UNIT/ML injection [id number] Yes INJECT 25-50 UNITS SUBCUTANEOUSLY ONCE DAILY AT BEDTIME Philemon Kingdom, MD Taking Active   metFORMIN (GLUCOPHAGE) 1000 MG tablet [id number] Yes TAKE ONE TABLET BY MOUTH TWICE DAILY Philemon Kingdom, MD Taking Active   naproxen sodium (ALEVE) 220 MG tablet [id number] Yes Take 220 mg by mouth. [provider] Taking Active   rosuvastatin (CRESTOR) 10 MG tablet [id number] Yes Take 1 tablet by mouth once daily Philemon Kingdom, MD Taking Active   Semaglutide, 1 MG/DOSE, (OZEMPIC, 1 MG/DOSE,) 4 MG/3ML SOPN [id number] Yes Inject 0.75 mLs (1  mg total) into the skin once a week. Philemon Kingdom, MD Taking Active   venlafaxine XR (EFFEXOR-XR) 150 MG 24 hr capsule [id number] Yes Take 1 capsule (150 mg total) by mouth 2 (two) times daily. Venia Carbon, MD Taking Active             Patient Active Problem List   Diagnosis Date Noted   Memory loss 09/09/2020   Multiple fibroadenomata of both breasts 04/24/2019   MDD (major depressive disorder), recurrent episode, moderate ([place]) 08/09/2017   Osteopenia    Osteoarthritis of right knee 12/13/2016   Advance directive discussed with patient 12/13/2016   Type 2 diabetes mellitus with neurological manifestations, uncontrolled ([place]) 02/15/2012   Routine general medical examination at a health care facility 05/03/2011   Class II obesity 05/03/2011   Hyperlipemia 09/01/2006    Immunization History  Administered Date(s) Administered   Influenza Split 01/14/2011   Influenza Whole 12/25/2008, 12/30/2009   Influenza-Unspecified 01/30/2014   Moderna Sars-Covid-2 Vaccination 07/20/2019, 08/17/2019, 02/26/2020   Pneumococcal Conjugate-13 12/13/2016   Pneumococcal Polysaccharide-23 12/25/2008   Td 12/01/2005, 11/26/2015    Conditions to be addressed/monitored:  {USCCMDZASSESSMENTOPTIONS:23563}  There are no care  plans that you recently modified to display for this patient.   Current Barriers:  {pharmacybarriers:24917}  Pharmacist Clinical Goal(s):  Patient will {PHARMACYGOALCHOICES:24921} through collaboration with PharmD and provider.   Interventions: 1:1 collaboration with Venia Carbon, MD regarding development and update of comprehensive plan of care as evidenced by provider attestation and co-signature Inter-disciplinary care team collaboration (see longitudinal plan of care) Comprehensive medication review performed; medication list updated in electronic medical record  Hyperlipidemia: (LDL goal < 100) -{US controlled/uncontrolled:25276} -Current treatment: *** -Medications previously tried: ***  -Current dietary patterns: *** -Current exercise habits: *** -Educated on {CCM HLD Counseling:25126} -{CCMPHARMDINTERVENTION:25122}  Diabetes (A1c goal <7%) -{US controlled/uncontrolled:25276} -Current medications: *** -Medications previously tried: ***  -Current home glucose readings fasting glucose: *** post prandial glucose: *** -{ACTIONS;DENIES/REPORTS:21021675::"Denies"} hypoglycemic/hyperglycemic symptoms -Current meal patterns:  breakfast: ***  lunch: ***  dinner: *** snacks: *** drinks: *** -Current exercise: *** -Educated on {CCM DM COUNSELING:25123} -Counseled to check feet daily and get yearly eye exams -{CCMPHARMDINTERVENTION:25122}  Patient Goals/Self-Care Activities Patient will:  - {pharmacypatientgoals:24919}  Follow Up Plan: {CM FOLLOW UP RJJO:84166}   Medication Assistance: {MEDASSISTANCEINFO:25044}  Compliance/Adherence/Medication fill history: Care Gaps: Colonoscopy Eye Exam  Star-Rating Drugs: ***  Patient's preferred pharmacy is:  [facility] - [place], [place] - [address] [place] [address] [place] [address] Phone: [phone] Fax: [phone]  Uses pill box? Yes - adherence packs Pt  endorses ***% compliance  Care Plan and Follow Up Patient Decision:  Patient agrees to Care Plan and Follow-up.   Debbora Dus, PharmD Clinical Pharmacist [facility] at [facility] [phone]

## 2020-11-04 ENCOUNTER — Telehealth: Payer: Self-pay

## 2020-11-04 NOTE — Telephone Encounter (Signed)
[place] Night - Client Nonclinical Telephone Record AccessNurse Client [name] Night - Client Client Site [place] Physician [name]- MD Contact Type Call Who Is Calling Patient / Member / Family / Caregiver Caller Name [name] Caller Phone Number [phone] Patient Name [name] Patient DOB 31-Dec-1950 Call Type Message Only Information Provided Reason for Call Request for General Office Information Initial Comment Caller states he is calling to confirm an appointment. Additional Comment Provided office hours to caller. Disp. Time Disposition Final User 11/04/2020 7:40:44 AM General Information Provided Yes [name], [name] Call Closed By: [name] Transaction Date/Time: 11/04/2020 7:37:21 AM (ET)

## 2020-11-04 NOTE — Telephone Encounter (Signed)
Made appt

## 2020-11-04 NOTE — Telephone Encounter (Signed)
Jacqueline Cole stated that he is okay with the azpt for Wednesday '@1230'$ 

## 2020-11-05 ENCOUNTER — Encounter: Payer: Self-pay | Admitting: Internal Medicine

## 2020-11-05 ENCOUNTER — Ambulatory Visit (INDEPENDENT_AMBULATORY_CARE_PROVIDER_SITE_OTHER): Payer: PPO | Admitting: Internal Medicine

## 2020-11-05 ENCOUNTER — Other Ambulatory Visit: Payer: Self-pay

## 2020-11-05 ENCOUNTER — Telehealth: Payer: Self-pay

## 2020-11-05 VITALS — BP 102/76 | HR 115 | Temp 97.1°F | Ht 65.0 in | Wt 151.0 lb

## 2020-11-05 DIAGNOSIS — E1142 Type 2 diabetes mellitus with diabetic polyneuropathy: Secondary | ICD-10-CM

## 2020-11-05 DIAGNOSIS — E860 Dehydration: Secondary | ICD-10-CM | POA: Diagnosis not present

## 2020-11-05 DIAGNOSIS — R112 Nausea with vomiting, unspecified: Secondary | ICD-10-CM | POA: Diagnosis not present

## 2020-11-05 DIAGNOSIS — E876 Hypokalemia: Secondary | ICD-10-CM

## 2020-11-05 DIAGNOSIS — N1832 Chronic kidney disease, stage 3b: Secondary | ICD-10-CM | POA: Diagnosis not present

## 2020-11-05 LAB — CBC
HCT: 44.7 % (ref 36.0–46.0)
Hemoglobin: 14.7 g/dL (ref 12.0–15.0)
MCHC: 32.9 g/dL (ref 30.0–36.0)
MCV: 89.3 fl (ref 78.0–100.0)
Platelets: 212 10*3/uL (ref 150.0–400.0)
RBC: 5.01 Mil/uL (ref 3.87–5.11)
RDW: 15 % (ref 11.5–15.5)
WBC: 12.3 10*3/uL — ABNORMAL HIGH (ref 4.0–10.5)

## 2020-11-05 LAB — RENAL FUNCTION PANEL
Albumin: 3.9 g/dL (ref 3.5–5.2)
BUN: 16 mg/dL (ref 6–23)
CO2: 29 mEq/L (ref 19–32)
Calcium: 10.3 mg/dL (ref 8.4–10.5)
Chloride: 85 mEq/L — ABNORMAL LOW (ref 96–112)
Creatinine, Ser: 1.18 mg/dL (ref 0.40–1.20)
GFR: 47.15 mL/min — ABNORMAL LOW (ref >60.00)
Glucose, Bld: 221 mg/dL — ABNORMAL HIGH (ref 70–99)
Phosphorus: 3.4 mg/dL (ref 2.3–4.6)
Potassium: 2.5 mEq/L — CL (ref 3.5–5.1)
Sodium: 139 mEq/L (ref 135–145)

## 2020-11-05 LAB — HEPATIC FUNCTION PANEL
ALT: 13 U/L (ref 0–35)
AST: 17 U/L (ref 0–37)
Albumin: 3.9 g/dL (ref 3.5–5.2)
Alkaline Phosphatase: 67 U/L (ref 39–117)
Bilirubin, Direct: 0.4 mg/dL — ABNORMAL HIGH (ref 0.0–0.3)
Total Bilirubin: 1.5 mg/dL — ABNORMAL HIGH (ref 0.2–1.2)
Total Protein: 6.1 g/dL (ref 6.0–8.3)

## 2020-11-05 MED ORDER — ONDANSETRON 4 MG PO TBDP: 4.0000 mg | ORAL_TABLET | Freq: Three times a day (TID) | ORAL | 0 refills | Status: DC | PRN

## 2020-11-05 MED ORDER — POTASSIUM CHLORIDE CRYS ER 20 MEQ PO TBCR: 20.0000 meq | EXTENDED_RELEASE_TABLET | Freq: Two times a day (BID) | ORAL | 0 refills | Status: DC

## 2020-11-05 NOTE — Patient Instructions (Signed)
Please try oral rehydration with sips of pedialyte every 5-10 minutes and slowly increase the amount if you have no vomiting.

## 2020-11-05 NOTE — Progress Notes (Signed)
 Subjective:    Patient ID: Jacqueline Cole, female    DOB: 02/21/1951, 70 y.o.   MRN: [id number]  HPI Here due to nausea and weight loss With husband This visit occurred during the SARS-CoV-2 public health emergency.  Safety protocols were in place, including screening questions prior to the visit, additional usage of staff PPE, and extensive cleaning of exam room while observing appropriate contact time as indicated for disinfecting solutions.   Hasn't eaten much of anything for the past 2 weeks Hadn't been drinking either--but now can keep water down since yesterday Would vomit with anything she would take No abdominal pain May have had a little blood in the vomitus No diarrhea now---but has it with the ozempic  Took last dose of ozempic 2 weeks ago Diarrhea went away but still vomiting everything till yesterday  Went to ER 8/18-- sat in waiting room for 4 hours and still waiting She felt more exhausted then  Stopped all meds except venlafaxine AM sugar today 155  Current Outpatient Medications on File Prior to Visit  Medication Sig Dispense Refill   Blood Glucose Monitoring Suppl (ONETOUCH VERIO FLEX SYSTEM) w/Device KIT Use to check blood sugar 2 times a day. 1 kit 0   gabapentin (NEURONTIN) 300 MG capsule Take 2 capsules (600 mg total) by mouth at bedtime. 180 capsule 3   glucose blood (ONETOUCH VERIO) test strip Use as instructed to check blood sugar 2 times a day. 200 each 12   ibuprofen (ADVIL,MOTRIN) 200 MG tablet Take 200 mg by mouth every 6 (six) hours as needed.     Insulin Syringe-Needle U-100 25G X 5/8" 1 ML MISC Use daily as directed 100 each 2   Lancets (ONETOUCH ULTRASOFT) lancets Use as instructed to check blood sugar 2 times a day. 200 each 12   LANTUS 100 UNIT/ML injection INJECT 25-50 UNITS SUBCUTANEOUSLY ONCE DAILY AT BEDTIME 30 mL 3   metFORMIN (GLUCOPHAGE) 1000 MG tablet TAKE ONE TABLET BY MOUTH TWICE DAILY 180 tablet 2   naproxen sodium (ALEVE) 220 MG  tablet Take 220 mg by mouth.     rosuvastatin (CRESTOR) 10 MG tablet TAKE ONE TABLET BY MOUTH EVERY MORNING 90 tablet 1   Semaglutide, 1 MG/DOSE, (OZEMPIC, 1 MG/DOSE,) 4 MG/3ML SOPN Inject 0.75 mLs (1 mg total) into the skin once a week. 9 mL 3   venlafaxine XR (EFFEXOR-XR) 150 MG 24 hr capsule TAKE ONE CAPSULE BY MOUTH EVERY MORNING and TAKE ONE TABLET BY MOUTH EVERYDAY AT BEDTIME 180 capsule 0   No current facility-administered medications on file prior to visit.    Allergies  Allergen Reactions   Onglyza [Saxagliptin] Hives and Nausea And Vomiting   Liraglutide Nausea Only    Can't eat, nausea, higher sugars    Past Medical History:  Diagnosis Date   Depression    Diabetes mellitus type II    Hyperlipemia    Major depressive disorder, recurrent, in remission (HCC) 09/01/2006   Qualifier: Diagnosis of  By: Dorle, Patricia     Obesity    Osteopenia    Syncope 1998   DM diagnosis    Past Surgical History:  Procedure Laterality Date   ABDOMINAL HYSTERECTOMY  ~2005   and BSO   BREAST BIOPSY Right 01/05/2019   rt stereo bx 2 areas 1st area ribbon clip   BREAST BIOPSY Right 01/05/2019   rt stereo bx 2nd area coil clip   CHOLECYSTECTOMY  1975   COSMETIC SURGERY       Injury Right face as a child   VAGINAL DELIVERY     X 2    Family History  Problem Relation Age of Onset   Stroke Father        CVA   Heart disease Father        MI   Cancer Sister        breast cancer   Breast cancer Sister 74   Heart disease Brother        MI   Cancer Brother        Lung    Social History   Socioeconomic History   Marital status: Married    Spouse name: Not on file   Number of children: 2   Years of education: Not on file   Highest education level: Not on file  Occupational History    Comment: "Just up and quit" at [employer] Use   Smoking status: Former    Types: Cigarettes    Quit date: 03/15/1993    Years since quitting: 27.6   Smokeless tobacco: Never   Substance and Sexual Activity   Alcohol use: No    Alcohol/week: 0.0 standard drinks   Drug use: No   Sexual activity: Not on file  Other Topics Concern   Not on file  Social History Narrative   Has living will   Husband, then daughter [name], have health care POA   Would reluctantly accept resuscitation but no life prolonging measures   Social Determinants of Health   Financial Resource Strain: Low Risk    Difficulty of Paying Living Expenses: Not very hard  Food Insecurity: Not on file  Transportation Needs: Not on file  Physical Activity: Not on file  Stress: Not on file  Social Connections: Not on file  Intimate Partner Violence: Not on file   Review of Systems Has had some fever---"breaks easy" (mostly at night--has sweats) No cough Having trouble breathing--SOB if trying to walk or even sitting around Has had 6 falls this week    Objective:   Physical Exam Constitutional:      Comments: Appears ill/washed out  Cardiovascular:     Rate and Rhythm: Regular rhythm. Tachycardia present.     Heart sounds: No murmur heard.   No gallop.  Pulmonary:     Effort: Pulmonary effort is normal.     Breath sounds: Normal breath sounds. No wheezing or rales.  Abdominal:     Palpations: Abdomen is soft.     Tenderness: There is no abdominal tenderness.  Musculoskeletal:     Cervical back: Neck supple.     Right lower leg: No edema.     Left lower leg: No edema.  Lymphadenopathy:     Cervical: No cervical adenopathy.  Neurological:     Mental Status: She is alert.           Assessment & Plan:

## 2020-11-05 NOTE — Telephone Encounter (Signed)
Patient called and scheduled for labs on Monday.

## 2020-11-05 NOTE — Assessment & Plan Note (Signed)
Hopefully recent decline in renal function is reversible

## 2020-11-05 NOTE — Telephone Encounter (Signed)
Potassium replacement sent to pharmacy.  Please schedule rpt labs on Monday.

## 2020-11-05 NOTE — Assessment & Plan Note (Signed)
Has been going on for 2 weeks Most likely reason is the ozempic/hypoglycemia--but unconfirmed Last dose 2 weeks ago--but symptoms persist No clear GI pathology--but could be ulcer, etc (unlikely) Wonders about pneumonia--possible but less likely Will recheck renal function---concern for ongoing renal failure as cause

## 2020-11-05 NOTE — Telephone Encounter (Signed)
[facility] called critical results   Potassium 2.5

## 2020-11-05 NOTE — Assessment & Plan Note (Signed)
Lab Results  Component Value Date   HGBA1C 5.5 09/09/2020   Off meds for now Will review over time

## 2020-11-05 NOTE — Assessment & Plan Note (Signed)
Tachycardic and weight down 19# (though some of that might have predated the illness of 2 weeks) Would do best with IV hydration--but doesn't want to go to the ER Discussed oral hydration regimen with pedialyte Will recheck labs to be sure no renal failure

## 2020-11-05 NOTE — Telephone Encounter (Signed)
Called the patient and she did not answer, called the patient's husbands phone and she was asleep due to no DPR he woke her up and she was given some recommendations and the patient chose to do the pills because she stated that the ER completely wiped her out. Please advise.

## 2020-11-05 NOTE — Telephone Encounter (Signed)
Please call patient - kidney function has improved some however potassium level returned very low which could contribute to her feeling poorly.  If having ongoing nausea/vomiting, I'm hesitant to try to replace in outpatient setting by mouth as this could cause more GI upset.  Probably safest option is to return to ER for quicker potassium replacement there. If she agrees, we could call charge nurse to notify she's on her way.  If declines, would send in K-dur 46mq to take 1 tab BID x 5 days and then recheck levels.  If any irregular heart beat, will need ER eval.

## 2020-11-06 ENCOUNTER — Telehealth: Payer: Self-pay

## 2020-11-06 ENCOUNTER — Ambulatory Visit: Payer: PRIVATE HEALTH INSURANCE | Admitting: Internal Medicine

## 2020-11-06 NOTE — Telephone Encounter (Signed)
Noted She canceled today's visit so I am trying to get her to come in on Monday and see me and I can do the labs then

## 2020-11-06 NOTE — Telephone Encounter (Signed)
Left detailed messages on pt's phone and husband's phone that Dr Silvio Pate wants her to come in and see him Monday the 29th instead of her coming in just for labs. She can be put in the 4pm slot or make an appt at 130.

## 2020-11-06 NOTE — Telephone Encounter (Signed)
She is scheduled for 145 on 8-29.

## 2020-11-06 NOTE — Telephone Encounter (Signed)
Called and spoke to pt's husband about the appt that was canceled today. He says that since her appt here yesterday, she has been in bed too weak to move. He cannot get her to come in the office today. He did go get the potassium and she has taken that.

## 2020-11-10 ENCOUNTER — Other Ambulatory Visit: Payer: PRIVATE HEALTH INSURANCE

## 2020-11-10 ENCOUNTER — Ambulatory Visit: Payer: PRIVATE HEALTH INSURANCE | Admitting: Internal Medicine

## 2020-11-13 ENCOUNTER — Encounter: Payer: Self-pay | Admitting: Internal Medicine

## 2020-11-13 ENCOUNTER — Other Ambulatory Visit: Payer: Self-pay

## 2020-11-13 ENCOUNTER — Ambulatory Visit (INDEPENDENT_AMBULATORY_CARE_PROVIDER_SITE_OTHER): Payer: PPO | Admitting: Internal Medicine

## 2020-11-13 ENCOUNTER — Telehealth: Payer: Self-pay

## 2020-11-13 VITALS — BP 98/64 | HR 119 | Temp 97.8°F | Ht 65.0 in | Wt 149.0 lb

## 2020-11-13 DIAGNOSIS — R112 Nausea with vomiting, unspecified: Secondary | ICD-10-CM

## 2020-11-13 DIAGNOSIS — E1142 Type 2 diabetes mellitus with diabetic polyneuropathy: Secondary | ICD-10-CM | POA: Diagnosis not present

## 2020-11-13 DIAGNOSIS — E441 Mild protein-calorie malnutrition: Secondary | ICD-10-CM | POA: Diagnosis not present

## 2020-11-13 DIAGNOSIS — E871 Hypo-osmolality and hyponatremia: Secondary | ICD-10-CM | POA: Insufficient documentation

## 2020-11-13 DIAGNOSIS — E876 Hypokalemia: Secondary | ICD-10-CM | POA: Diagnosis not present

## 2020-11-13 NOTE — Assessment & Plan Note (Signed)
This is better--but still with anorexia Ondansetron helps but is sedating--hopefully she won't need it going forward

## 2020-11-13 NOTE — Telephone Encounter (Signed)
Called and advised pt assistance Ozempic ( 9 boxes) has been delivered and is ready for pick up.

## 2020-11-13 NOTE — Progress Notes (Signed)
Subjective:    Patient ID: Jacqueline Cole, female    DOB: [birth year], 70 y.o.   MRN: [id number]  HPI Here for follow up of nausea and weight loss This visit occurred during the SARS-CoV-2 public health emergency.  Safety protocols were in place, including screening questions prior to the visit, additional usage of staff PPE, and extensive cleaning of exam room while observing appropriate contact time as indicated for disinfecting solutions.   Still not eating---"I can't" Ondansetron helps nausea--but puts her to sleep Drinking fluids okay No abdominal pain  Checking sugars  145 yesterday---155 the day before No neuropathy pain off the gabapentin  Still with dizziness Unable to walk  Current Outpatient Medications on File Prior to Visit  Medication Sig Dispense Refill   Blood Glucose Monitoring Suppl ([place]) w/Device KIT Use to check blood sugar 2 times a day. 1 kit 0   glucose blood (ONETOUCH VERIO) test strip Use as instructed to check blood sugar 2 times a day. 200 each 12   ibuprofen (ADVIL,MOTRIN) 200 MG tablet Take 200 mg by mouth every 6 (six) hours as needed.     Lancets (ONETOUCH ULTRASOFT) lancets Use as instructed to check blood sugar 2 times a day. 200 each 12   ondansetron (ZOFRAN-ODT) 4 MG disintegrating tablet Take 1 tablet (4 mg total) by mouth every 8 (eight) hours as needed for nausea or vomiting. 30 tablet 0   potassium chloride SA (KLOR-CON) 20 MEQ tablet Take 1 tablet (20 mEq total) by mouth 2 (two) times daily. 10 tablet 0   venlafaxine XR (EFFEXOR-XR) 150 MG 24 hr capsule TAKE ONE CAPSULE BY MOUTH EVERY MORNING and TAKE ONE TABLET BY MOUTH EVERYDAY AT BEDTIME 180 capsule 0   No current facility-administered medications on file prior to visit.    Allergies  Allergen Reactions   Onglyza [Saxagliptin] Hives and Nausea And Vomiting   Liraglutide Nausea Only    Can't eat, nausea, higher sugars    Past Medical History:  Diagnosis Date    Depression    Diabetes mellitus type II    Hyperlipemia    Major depressive disorder, recurrent, in remission ([place]) 09/01/2006   Qualifier: Diagnosis of  By: Lelon Mast     Obesity    Osteopenia    Syncope 1998   DM diagnosis    Past Surgical History:  Procedure Laterality Date   ABDOMINAL HYSTERECTOMY  ~2005   and BSO   BREAST BIOPSY Right 01/05/2019   rt stereo bx 2 areas 1st area ribbon clip   BREAST BIOPSY Right 01/05/2019   rt stereo bx 2nd area coil clip   CHOLECYSTECTOMY  1975   COSMETIC SURGERY     Injury Right face as a child   VAGINAL DELIVERY     X 2    Family History  Problem Relation Age of Onset   Stroke Father        CVA   Heart disease Father        MI   Cancer Sister        breast cancer   Breast cancer Sister 61   Heart disease Brother        MI   Cancer Brother        Lung    Social History   Socioeconomic History   Marital status: Married    Spouse name: Not on file   Number of children: 2   Years of education: Not on file  Highest education level: Not on file  Occupational History    Comment: "Just up and quit" at [employer] Use   Smoking status: Former    Types: Cigarettes    Quit date: 03/15/1993    Years since quitting: 27.6   Smokeless tobacco: Never  Substance and Sexual Activity   Alcohol use: No    Alcohol/week: 0.0 standard drinks   Drug use: No   Sexual activity: Not on file  Other Topics Concern   Not on file  Social History Narrative   Has living will   Husband, then daughter [name], have health care POA   Would reluctantly accept resuscitation but no life prolonging measures   Social Determinants of Health   Financial Resource Strain: Low Risk    Difficulty of Paying Living Expenses: Not very hard  Food Insecurity: Not on file  Transportation Needs: Not on file  Physical Activity: Not on file  Stress: Not on file  Social Connections: Not on file  Intimate Partner Violence: Not on file   Review of  Systems Using advil for pain---asked her to switch to tylenol Not moving bowels ----no diarrhea since the beginning    Objective:   Physical Exam Constitutional:      Comments: Appears washed out In wheelchair  Cardiovascular:     Rate and Rhythm: Normal rate and regular rhythm.     Heart sounds: No murmur heard.   No gallop.     Comments: HR in 90's Pulmonary:     Effort: Pulmonary effort is normal.     Breath sounds: Normal breath sounds. No wheezing or rales.  Abdominal:     Palpations: Abdomen is soft.     Tenderness: There is no abdominal tenderness. There is no guarding or rebound.  Musculoskeletal:     Cervical back: Neck supple.     Right lower leg: No edema.     Left lower leg: No edema.  Lymphadenopathy:     Cervical: No cervical adenopathy.  Neurological:     Mental Status: She is alert.           Assessment & Plan:

## 2020-11-13 NOTE — Assessment & Plan Note (Addendum)
Sugars still okay without Rx Off the insulin, semaglutide and metformin for now May need to restart soon---would probably start with the insulin

## 2020-11-13 NOTE — Assessment & Plan Note (Signed)
Has lost considerable weight Not eating---discussed taking 2,3 or even 4 ensure/boost a day until she is eating regular food again

## 2020-11-13 NOTE — Assessment & Plan Note (Signed)
Severe and likely from the vomiting Now on supplements Will recheck

## 2020-11-14 ENCOUNTER — Telehealth: Payer: Self-pay

## 2020-11-14 LAB — RENAL FUNCTION PANEL
Albumin: 3.7 g/dL (ref 3.5–5.2)
BUN: 16 mg/dL (ref 6–23)
CO2: 34 mEq/L — ABNORMAL HIGH (ref 19–32)
Calcium: 9.8 mg/dL (ref 8.4–10.5)
Chloride: 85 mEq/L — ABNORMAL LOW (ref 96–112)
Creatinine, Ser: 0.93 mg/dL (ref 0.40–1.20)
GFR: 62.73 mL/min (ref >60.00)
Glucose, Bld: 213 mg/dL — ABNORMAL HIGH (ref 70–99)
Phosphorus: 3.4 mg/dL (ref 2.3–4.6)
Potassium: 2.5 mEq/L — CL (ref 3.5–5.1)
Sodium: 136 mEq/L (ref 135–145)

## 2020-11-14 NOTE — Telephone Encounter (Signed)
Patient called and informed of her lab results. Patient stated understanding and acknowledgement to double her dosage per Dr. Alla German recommendations.

## 2020-11-14 NOTE — Telephone Encounter (Signed)
[facility] called critical results @ 1130  Potassium 2.5

## 2020-11-16 ENCOUNTER — Inpatient Hospital Stay: Payer: PPO

## 2020-11-16 ENCOUNTER — Emergency Department: Payer: PPO

## 2020-11-16 ENCOUNTER — Emergency Department: Payer: Self-pay

## 2020-11-16 ENCOUNTER — Other Ambulatory Visit: Payer: Self-pay

## 2020-11-16 ENCOUNTER — Inpatient Hospital Stay
Admission: EM | Admit: 2020-11-16 | Discharge: 2020-11-18 | DRG: 871 | Discharge status: Against Medical Advice | Payer: PPO | Attending: Pulmonary Disease | Admitting: Pulmonary Disease

## 2020-11-16 DIAGNOSIS — Z823 Family history of stroke: Secondary | ICD-10-CM

## 2020-11-16 DIAGNOSIS — Z803 Family history of malignant neoplasm of breast: Secondary | ICD-10-CM | POA: Diagnosis not present

## 2020-11-16 DIAGNOSIS — E872 Acidosis: Secondary | ICD-10-CM | POA: Diagnosis not present

## 2020-11-16 DIAGNOSIS — Z9049 Acquired absence of other specified parts of digestive tract: Secondary | ICD-10-CM

## 2020-11-16 DIAGNOSIS — N170 Acute kidney failure with tubular necrosis: Secondary | ICD-10-CM | POA: Diagnosis not present

## 2020-11-16 DIAGNOSIS — D1802 Hemangioma of intracranial structures: Secondary | ICD-10-CM | POA: Diagnosis not present

## 2020-11-16 DIAGNOSIS — Z90710 Acquired absence of both cervix and uterus: Secondary | ICD-10-CM

## 2020-11-16 DIAGNOSIS — N1832 Chronic kidney disease, stage 3b: Secondary | ICD-10-CM | POA: Diagnosis not present

## 2020-11-16 DIAGNOSIS — I129 Hypertensive chronic kidney disease with stage 1 through stage 4 chronic kidney disease, or unspecified chronic kidney disease: Secondary | ICD-10-CM | POA: Diagnosis present

## 2020-11-16 DIAGNOSIS — K222 Esophageal obstruction: Secondary | ICD-10-CM | POA: Diagnosis present

## 2020-11-16 DIAGNOSIS — E1143 Type 2 diabetes mellitus with diabetic autonomic (poly)neuropathy: Secondary | ICD-10-CM | POA: Diagnosis not present

## 2020-11-16 DIAGNOSIS — Z90722 Acquired absence of ovaries, bilateral: Secondary | ICD-10-CM

## 2020-11-16 DIAGNOSIS — R571 Hypovolemic shock: Secondary | ICD-10-CM | POA: Diagnosis not present

## 2020-11-16 DIAGNOSIS — A084 Viral intestinal infection, unspecified: Secondary | ICD-10-CM | POA: Diagnosis present

## 2020-11-16 DIAGNOSIS — G9349 Other encephalopathy: Secondary | ICD-10-CM | POA: Diagnosis not present

## 2020-11-16 DIAGNOSIS — Z5329 Procedure and treatment not carried out because of patient's decision for other reasons: Secondary | ICD-10-CM | POA: Diagnosis not present

## 2020-11-16 DIAGNOSIS — R4781 Slurred speech: Secondary | ICD-10-CM | POA: Diagnosis not present

## 2020-11-16 DIAGNOSIS — E876 Hypokalemia: Secondary | ICD-10-CM | POA: Diagnosis not present

## 2020-11-16 DIAGNOSIS — E785 Hyperlipidemia, unspecified: Secondary | ICD-10-CM | POA: Diagnosis present

## 2020-11-16 DIAGNOSIS — R1111 Vomiting without nausea: Secondary | ICD-10-CM | POA: Diagnosis not present

## 2020-11-16 DIAGNOSIS — R000 Tachycardia, unspecified: Secondary | ICD-10-CM | POA: Diagnosis present

## 2020-11-16 DIAGNOSIS — Z87891 Personal history of nicotine dependence: Secondary | ICD-10-CM | POA: Diagnosis not present

## 2020-11-16 DIAGNOSIS — E1165 Type 2 diabetes mellitus with hyperglycemia: Secondary | ICD-10-CM | POA: Diagnosis present

## 2020-11-16 DIAGNOSIS — Z888 Allergy status to other drugs, medicaments and biological substances status: Secondary | ICD-10-CM | POA: Diagnosis not present

## 2020-11-16 DIAGNOSIS — R531 Weakness: Secondary | ICD-10-CM | POA: Diagnosis not present

## 2020-11-16 DIAGNOSIS — K2970 Gastritis, unspecified, without bleeding: Secondary | ICD-10-CM | POA: Diagnosis present

## 2020-11-16 DIAGNOSIS — K5730 Diverticulosis of large intestine without perforation or abscess without bleeding: Secondary | ICD-10-CM | POA: Diagnosis present

## 2020-11-16 DIAGNOSIS — R159 Full incontinence of feces: Secondary | ICD-10-CM | POA: Diagnosis not present

## 2020-11-16 DIAGNOSIS — D329 Benign neoplasm of meninges, unspecified: Secondary | ICD-10-CM | POA: Diagnosis not present

## 2020-11-16 DIAGNOSIS — R220 Localized swelling, mass and lump, head: Secondary | ICD-10-CM | POA: Diagnosis not present

## 2020-11-16 DIAGNOSIS — K3184 Gastroparesis: Secondary | ICD-10-CM | POA: Diagnosis present

## 2020-11-16 DIAGNOSIS — R4182 Altered mental status, unspecified: Secondary | ICD-10-CM

## 2020-11-16 DIAGNOSIS — Z8249 Family history of ischemic heart disease and other diseases of the circulatory system: Secondary | ICD-10-CM | POA: Diagnosis not present

## 2020-11-16 DIAGNOSIS — Z20822 Contact with and (suspected) exposure to covid-19: Secondary | ICD-10-CM | POA: Diagnosis present

## 2020-11-16 DIAGNOSIS — I616 Nontraumatic intracerebral hemorrhage, multiple localized: Secondary | ICD-10-CM | POA: Diagnosis not present

## 2020-11-16 DIAGNOSIS — Z79899 Other long term (current) drug therapy: Secondary | ICD-10-CM

## 2020-11-16 DIAGNOSIS — B9681 Helicobacter pylori [H. pylori] as the cause of diseases classified elsewhere: Secondary | ICD-10-CM | POA: Diagnosis present

## 2020-11-16 DIAGNOSIS — E1122 Type 2 diabetes mellitus with diabetic chronic kidney disease: Secondary | ICD-10-CM | POA: Diagnosis not present

## 2020-11-16 DIAGNOSIS — R739 Hyperglycemia, unspecified: Secondary | ICD-10-CM | POA: Diagnosis not present

## 2020-11-16 DIAGNOSIS — R112 Nausea with vomiting, unspecified: Secondary | ICD-10-CM

## 2020-11-16 DIAGNOSIS — R404 Transient alteration of awareness: Secondary | ICD-10-CM | POA: Diagnosis not present

## 2020-11-16 DIAGNOSIS — I6782 Cerebral ischemia: Secondary | ICD-10-CM | POA: Diagnosis not present

## 2020-11-16 DIAGNOSIS — R4020 Unspecified coma: Secondary | ICD-10-CM | POA: Diagnosis not present

## 2020-11-16 DIAGNOSIS — R0902 Hypoxemia: Secondary | ICD-10-CM | POA: Diagnosis not present

## 2020-11-16 LAB — CBC
HCT: 36 % (ref 36.0–46.0)
Hemoglobin: 12.5 g/dL (ref 12.0–15.0)
MCH: 30.4 pg (ref 26.0–34.0)
MCHC: 34.7 g/dL (ref 30.0–36.0)
MCV: 87.6 fL (ref 80.0–100.0)
Platelets: 218 10*3/uL (ref 150–400)
RBC: 4.11 MIL/uL (ref 3.87–5.11)
RDW: 14.1 % (ref 11.5–15.5)
WBC: 20.5 10*3/uL — ABNORMAL HIGH (ref 4.0–10.5)
nRBC: 0 % (ref 0.0–0.2)

## 2020-11-16 LAB — COMPREHENSIVE METABOLIC PANEL
ALT: 12 U/L (ref 0–44)
AST: 43 U/L — ABNORMAL HIGH (ref 15–41)
Albumin: 3.2 g/dL — ABNORMAL LOW (ref 3.5–5.0)
Alkaline Phosphatase: 66 U/L (ref 38–126)
Anion gap: 22 — ABNORMAL HIGH (ref 5–15)
BUN: 17 mg/dL (ref 8–23)
CO2: 26 mmol/L (ref 22–32)
Calcium: 9.3 mg/dL (ref 8.9–10.3)
Chloride: 90 mmol/L — ABNORMAL LOW (ref 98–111)
Creatinine, Ser: 1.26 mg/dL — ABNORMAL HIGH (ref 0.44–1.00)
GFR, Estimated: 46 mL/min — ABNORMAL LOW (ref >60)
Glucose, Bld: 352 mg/dL — ABNORMAL HIGH (ref 70–99)
Potassium: 2.4 mmol/L — CL (ref 3.5–5.1)
Sodium: 138 mmol/L (ref 135–145)
Total Bilirubin: 1.8 mg/dL — ABNORMAL HIGH (ref 0.3–1.2)
Total Protein: 5.5 g/dL — ABNORMAL LOW (ref 6.5–8.1)

## 2020-11-16 LAB — TROPONIN I (HIGH SENSITIVITY)
Troponin I (High Sensitivity): 29 ng/L — ABNORMAL HIGH (ref <18)
Troponin I (High Sensitivity): 39 ng/L — ABNORMAL HIGH (ref <18)
Troponin I (High Sensitivity): 101 ng/L (ref <18)
Troponin I (High Sensitivity): 194 ng/L (ref <18)

## 2020-11-16 LAB — CBG MONITORING, ED
Glucose-Capillary: 233 mg/dL — ABNORMAL HIGH (ref 70–99)
Glucose-Capillary: 66 mg/dL — ABNORMAL LOW (ref 70–99)
Glucose-Capillary: 68 mg/dL — ABNORMAL LOW (ref 70–99)
Glucose-Capillary: 77 mg/dL (ref 70–99)

## 2020-11-16 LAB — RESP PANEL BY RT-PCR (FLU A&B, COVID) ARPGX2
Influenza A by PCR: NEGATIVE
Influenza B by PCR: NEGATIVE
SARS Coronavirus 2 by RT PCR: NEGATIVE

## 2020-11-16 LAB — CBC WITH DIFFERENTIAL/PLATELET
Abs Immature Granulocytes: 0.11 10*3/uL — ABNORMAL HIGH (ref 0.00–0.07)
Basophils Absolute: 0 10*3/uL (ref 0.0–0.1)
Basophils Relative: 0 %
Eosinophils Absolute: 0.3 10*3/uL (ref 0.0–0.5)
Eosinophils Relative: 2 %
HCT: 39.7 % (ref 36.0–46.0)
Hemoglobin: 13.6 g/dL (ref 12.0–15.0)
Immature Granulocytes: 1 %
Lymphocytes Relative: 32 %
Lymphs Abs: 4.1 10*3/uL — ABNORMAL HIGH (ref 0.7–4.0)
MCH: 30.4 pg (ref 26.0–34.0)
MCHC: 34.3 g/dL (ref 30.0–36.0)
MCV: 88.8 fL (ref 80.0–100.0)
Monocytes Absolute: 1.1 10*3/uL — ABNORMAL HIGH (ref 0.1–1.0)
Monocytes Relative: 8 %
Neutro Abs: 7.4 10*3/uL (ref 1.7–7.7)
Neutrophils Relative %: 57 %
Platelets: 243 10*3/uL (ref 150–400)
RBC: 4.47 MIL/uL (ref 3.87–5.11)
RDW: 14.1 % (ref 11.5–15.5)
WBC: 13 10*3/uL — ABNORMAL HIGH (ref 4.0–10.5)
nRBC: 0.2 % (ref 0.0–0.2)

## 2020-11-16 LAB — PROTIME-INR
INR: 1 (ref 0.8–1.2)
Prothrombin Time: 13.2 seconds (ref 11.4–15.2)

## 2020-11-16 LAB — PROCALCITONIN: Procalcitonin: <0.1 ng/mL

## 2020-11-16 LAB — BLOOD GAS, VENOUS
Acid-Base Excess: 2.9 mmol/L — ABNORMAL HIGH (ref 0.0–2.0)
Bicarbonate: 30.4 mmol/L — ABNORMAL HIGH (ref 20.0–28.0)
O2 Saturation: 57.6 %
Patient temperature: 37
pCO2, Ven: 59 mmHg (ref 44.0–60.0)
pH, Ven: 7.32 (ref 7.250–7.430)
pO2, Ven: 33 mmHg (ref 32.0–45.0)

## 2020-11-16 LAB — MAGNESIUM
Magnesium: 1.8 mg/dL (ref 1.7–2.4)
Magnesium: 1.8 mg/dL (ref 1.7–2.4)

## 2020-11-16 LAB — BETA-HYDROXYBUTYRIC ACID: Beta-Hydroxybutyric Acid: 2.45 mmol/L — ABNORMAL HIGH (ref 0.05–0.27)

## 2020-11-16 LAB — CREATININE, SERUM
Creatinine, Ser: 1.14 mg/dL — ABNORMAL HIGH (ref 0.44–1.00)
GFR, Estimated: 52 mL/min — ABNORMAL LOW (ref >60)

## 2020-11-16 LAB — LACTIC ACID, PLASMA
Lactic Acid, Venous: 9.5 mmol/L (ref 0.5–1.9)
Lactic Acid, Venous: 5.1 mmol/L (ref 0.5–1.9)
Lactic Acid, Venous: 1.7 mmol/L (ref 0.5–1.9)

## 2020-11-16 LAB — TSH: TSH: 3.73 u[IU]/mL (ref 0.350–4.500)

## 2020-11-16 LAB — POTASSIUM: Potassium: 3.1 mmol/L — ABNORMAL LOW (ref 3.5–5.1)

## 2020-11-16 LAB — PHOSPHORUS: Phosphorus: 3.4 mg/dL (ref 2.5–4.6)

## 2020-11-16 LAB — HIV ANTIBODY (ROUTINE TESTING W REFLEX): HIV Screen 4th Generation wRfx: NONREACTIVE

## 2020-11-16 LAB — CORTISOL: Cortisol, Plasma: 30.8 ug/dL

## 2020-11-16 LAB — T4, FREE: Free T4: 1.55 ng/dL — ABNORMAL HIGH (ref 0.61–1.12)

## 2020-11-16 MED ORDER — INSULIN ASPART 100 UNIT/ML IJ SOLN
0.0000 [IU] | INTRAMUSCULAR | Status: DC
Start: 2020-11-16 — End: 2020-11-18
  Administered 2020-11-16: 3 [IU] via SUBCUTANEOUS
  Administered 2020-11-17: 2 [IU] via SUBCUTANEOUS
  Administered 2020-11-17: 1 [IU] via SUBCUTANEOUS
  Administered 2020-11-18: 2 [IU] via SUBCUTANEOUS
  Filled 2020-11-16 (×4): qty 1

## 2020-11-16 MED ORDER — POTASSIUM CHLORIDE 2 MEQ/ML IV SOLN
INTRAVENOUS | Status: DC
  Filled 2020-11-16 (×8): qty 1000

## 2020-11-16 MED ORDER — LACTATED RINGERS IV BOLUS
1000.0000 mL | Freq: Once | INTRAVENOUS | Status: AC
  Administered 2020-11-16: 1000 mL via INTRAVENOUS

## 2020-11-16 MED ORDER — ONDANSETRON HCL 4 MG/2ML IJ SOLN: 4.0000 mg | Freq: Once | INTRAMUSCULAR | Status: AC

## 2020-11-16 MED ORDER — ONDANSETRON HCL 4 MG/2ML IJ SOLN: 4.0000 mg | Freq: Four times a day (QID) | INTRAMUSCULAR | Status: DC | PRN

## 2020-11-16 MED ORDER — DOCUSATE SODIUM 100 MG PO CAPS: 100.0000 mg | ORAL_CAPSULE | Freq: Two times a day (BID) | ORAL | Status: DC | PRN

## 2020-11-16 MED ORDER — POLYETHYLENE GLYCOL 3350 17 G PO PACK: 17.0000 g | PACK | Freq: Every day | ORAL | Status: DC | PRN

## 2020-11-16 MED ORDER — MAGNESIUM SULFATE 2 GM/50ML IV SOLN
2.0000 g | Freq: Once | INTRAVENOUS | Status: AC
  Administered 2020-11-16: 2 g via INTRAVENOUS
  Filled 2020-11-16: qty 50

## 2020-11-16 MED ORDER — ENOXAPARIN SODIUM 40 MG/0.4ML IJ SOSY
40.0000 mg | PREFILLED_SYRINGE | INTRAMUSCULAR | Status: DC
  Administered 2020-11-16 – 2020-11-17 (×2): 40 mg via SUBCUTANEOUS
  Filled 2020-11-16 (×2): qty 0.4

## 2020-11-16 MED ORDER — SODIUM CHLORIDE 0.9 % IV SOLN: 250.0000 mL | INTRAVENOUS | Status: DC

## 2020-11-16 MED ORDER — NOREPINEPHRINE 4 MG/250ML-% IV SOLN: 0.0000 ug/min | INTRAVENOUS | Status: DC

## 2020-11-16 MED ORDER — HYDROCORTISONE 1 % EX CREA
1.0000 "application " | TOPICAL_CREAM | Freq: Every day | CUTANEOUS | Status: DC | PRN
  Filled 2020-11-16: qty 28

## 2020-11-16 MED ORDER — ONDANSETRON HCL 4 MG/2ML IJ SOLN
INTRAMUSCULAR | Status: AC
  Administered 2020-11-16: 4 mg via INTRAVENOUS
  Filled 2020-11-16: qty 2

## 2020-11-16 MED ORDER — NOREPINEPHRINE 4 MG/250ML-% IV SOLN
2.0000 ug/min | INTRAVENOUS | Status: DC
  Administered 2020-11-16: 2 ug/min via INTRAVENOUS
  Administered 2020-11-17: 5 ug/min via INTRAVENOUS
  Filled 2020-11-16: qty 250

## 2020-11-16 MED ORDER — PANTOPRAZOLE SODIUM 40 MG IV SOLR
40.0000 mg | Freq: Two times a day (BID) | INTRAVENOUS | Status: DC
  Administered 2020-11-16 – 2020-11-18 (×5): 40 mg via INTRAVENOUS
  Filled 2020-11-16 (×5): qty 40

## 2020-11-16 MED ORDER — POTASSIUM CHLORIDE CRYS ER 20 MEQ PO TBCR
40.0000 meq | EXTENDED_RELEASE_TABLET | Freq: Once | ORAL | Status: AC
  Administered 2020-11-16: 40 meq via ORAL
  Filled 2020-11-16: qty 2

## 2020-11-16 MED ORDER — NOREPINEPHRINE 4 MG/250ML-% IV SOLN
INTRAVENOUS | Status: AC
  Filled 2020-11-16: qty 250

## 2020-11-16 MED ORDER — POTASSIUM CHLORIDE 10 MEQ/100ML IV SOLN
10.0000 meq | INTRAVENOUS | Status: AC
  Administered 2020-11-16 (×4): 10 meq via INTRAVENOUS
  Filled 2020-11-16 (×4): qty 100

## 2020-11-16 MED ORDER — GADOBUTROL 1 MMOL/ML IV SOLN
7.0000 mL | Freq: Once | INTRAVENOUS | Status: AC | PRN
  Administered 2020-11-16: 7 mL via INTRAVENOUS
  Filled 2020-11-16: qty 7.5

## 2020-11-16 MED ORDER — THIAMINE HCL 100 MG/ML IJ SOLN
100.0000 mg | Freq: Every day | INTRAMUSCULAR | Status: DC
  Administered 2020-11-16 – 2020-11-17 (×2): 100 mg via INTRAVENOUS
  Filled 2020-11-16 (×2): qty 2

## 2020-11-16 NOTE — H&P (Signed)
NAME:  Jacqueline Cole, MRN:  [id number], DOB:  1950-10-29, LOS: 0 ADMISSION DATE:  11/16/2020, CONSULTATION DATE: 11/16/2020 REFERRING MD: Dr. Charna Archer, CHIEF COMPLAINT: Nausea/Vomiting    History of Present Illness:  This is a 70 yo female with a PMH of Type II Diabetes Mellitus who presented to [facility] ER on 09/4 via EMS with unresponsiveness, last known well time 06:30 am on 09/4.  Per EMS run sheet upon arrival at pts home she was found slumped down in her wheelchair unresponsive, but they were able to palpate a weak carotid pulse.  EMS EKG revealed sinus tachycardia, but no ST-elevation.  EMS unable to obtain an automatic or manual bp initially, therefore iv fluid bolus administered.  En route to the ER pt became alert and responsive with garbled speech.    ED course Upon arrival to the ER pt minimally responsive, but able to open her eyes and follow commands with no other neurological deficits.  CT Head negative for acute intracranial abnormality, however concerning for meningioma recommendation MRI Brain with/ without iv contrast for further characterization.  Pt has had continued nausea/vomiting for over a month.  She has been unable to take her po medications including her metformin.  She was seen by her PCP on 09/1 due to poor po intake and severe hypokalemia.  In the ER labs revealed K+ 2.4, chloride 90, glucose 352, creatinine 1.26, anion gap 22, albumin 3.2, AST 43, wbc 13.0, troponin 39, lactic acid 9.5, and pct <0.10.  Pt continued to have severe  nausea/vomiting and became hypotensive.  She received 2L LR bolus, but remained hypotensive requiring peripheral levophed gtt.  PCCM contacted for ICU admission for additional workup and treatment.    Pertinent  Medical History  Depression  Type II Diabetes Mellitus  HLD Obesity  Osteopenia  Syncope   Significant Hospital Events: Including procedures, antibiotic start and stop dates in addition to other pertinent events   09/4: Pt admitted to  ICU with hypovolemic shock due to severe  nausea/vomiting requiring peripheral levophed gtt 09/4: CT Head: revealed Extra-axial 3.8 x 2.3 x 2.2 cm soft tissue mass in the anterior right frontal region with mild local mass effect on the right frontal lobe, suspicious for a meningioma. MRI brain without and with IV contrast is recommended for further characterization. Generalized cerebral volume loss and mild-to-moderate chronic small vessel ischemic changes in the cerebral white matter. No evidence of acute intracranial abnormality. 09/4: CT Abd Pelvis: No acute findings in the abdomen or pelvis. Diverticulosis of the sigmoid colon without evidence of acute diverticulitis.  09/4 MR Brain:    Interim History / Subjective:  Pt resting in bed no complaints asking can she drink water.  States her nausea is a lot better.  She has not had a BM in 3 weeks, but had a BM today.   Objective   Blood pressure 116/63, pulse 90, temperature 99.2 F (37.3 C), temperature source Rectal, resp. rate 15, height 5' 6" (1.676 m), weight 70.3 kg, SpO2 95 %.        Intake/Output Summary (Last 24 hours) at 11/16/2020 1043 Last data filed at 11/16/2020 0937 Gross per 24 hour  Intake 1100 ml  Output --  Net 1100 ml   Filed Weights   11/16/20 0807  Weight: 70.3 kg    Examination: General: acutely ill appearing female, resting in bed NAD  HENT: supple, no JVD  Lungs: clear throughout, even, non labored  Cardiovascular: nsr, rrr, no R/G, 2+ radial/1+  distal pulses, no edema  Abdomen: +BS x4, obese, soft, non tender, non distended  Extremities: normal bulk and tone, moves all extremities  Neuro: alert and oriented, follows commands  GU: voiding urine   Resolved Hospital Problem list     Assessment & Plan:   Hypovolemic shock secondary to vomiting  - Continuous telemetry monitoring  - Aggressive fluid resuscitation and prn levophed gtt to maintain map >65 - TSH, Free T4, and cortisol level pending   Acute  kidney injury secondary to ATN  Hypokalemia and hypomagnesia  Lactic acidosis: 9.5~5.1 - Trend BMP  - Replace electrolytes as indicated  - Monitor UOP - Avoid nephrotoxic medications  - LR with 20 meq KCL _0  ml/hr   Nausea/Vomiting suspected secondary to gastroparesis although CT Head concerning for possible meningioma  Gastroparesis  - Sips of water only  - Prn zofran for nausea/vomiting - Scheduled protonix  - GI consulted appreciate input  - Gastric emptying study pending   Type II Diabetes Mellitus  - Hemoglobin A1c pending - CBG's q4hrs - SSI    Acute encephalopathy likely secondary to hypotension  Suspected meningioma per CT Head 09/4 - MR Brain with/without contrast  - Frequent reorientation  - Avoid sedating medications when able   Best Practice (right click and "Reselect all SmartList Selections" daily)   Diet/type: Sips of water only  DVT prophylaxis: LMWH GI prophylaxis: PPI Lines: PICC line placement pending  Foley:  N/A Code Status:  full code Last date of multidisciplinary goals of care discussion [N/A]  Updated pt and pts husband regarding plan of care and all questions were answered 11/16/20 Labs   CBC: Recent Labs  Lab 11/16/20 0810  WBC 13.0*  NEUTROABS 7.4  HGB 13.6  HCT 39.7  MCV 88.8  PLT 765    Basic Metabolic Panel: Recent Labs  Lab 11/13/20 1510 11/16/20 0805 11/16/20 0810  NA 136  --  138  K 2.5*  --  2.4*  CL 85*  --  90*  CO2 34*  --  26  GLUCOSE 213*  --  352*  BUN 16  --  17  CREATININE 0.93  --  1.26*  CALCIUM 9.8  --  9.3  MG  --  1.8  --   PHOS 3.4  --   --    GFR: Estimated Creatinine Clearance: 39.4 mL/min (A) (by C-G formula based on SCr of 1.26 mg/dL (H)). Recent Labs  Lab 11/16/20 0805 11/16/20 0810  PROCALCITON <0.10  --   WBC  --  13.0*  LATICACIDVEN 9.5*  --     Liver Function Tests: Recent Labs  Lab 11/13/20 1510 11/16/20 0810  AST  --  43*  ALT  --  12  ALKPHOS  --  66  BILITOT  --  1.8*   PROT  --  5.5*  ALBUMIN 3.7 3.2*   No results for input(s): LIPASE, AMYLASE in the last 168 hours. No results for input(s): AMMONIA in the last 168 hours.  ABG    Component Value Date/Time   HCO3 30.4 (H) 11/16/2020 0946   O2SAT 57.6 11/16/2020 0946     Coagulation Profile: Recent Labs  Lab 11/16/20 0810  INR 1.0    Cardiac Enzymes: No results for input(s): CKTOTAL, CKMB, CKMBINDEX, TROPONINI in the last 168 hours.  HbA1C: Hemoglobin A1C  Date/Time Value Ref Range Status  05/30/2020 09:01 AM 5.9 (A) 4.0 - 5.6 % Final  02/04/2020 10:08 AM 6.0 (A) 4.0 - 5.6 % Corrected  Hgb A1c MFr Bld  Date/Time Value Ref Range Status  09/09/2020 10:06 AM 5.5 4.6 - 6.5 % Final    Comment:    Glycemic Control Guidelines for People with Diabetes:Non Diabetic:  <6%Goal of Therapy: <7%Additional Action Suggested:  >8%   10/13/2016 12:13 PM 8.2 (H) 4.6 - 6.5 % Final    Comment:    Glycemic Control Guidelines for People with Diabetes:Non Diabetic:  <6%Goal of Therapy: <7%Additional Action Suggested:  >8%     CBG: No results for input(s): GLUCAP in the last 168 hours.  Review of Systems: Positives in BOLD   Gen: Denies fever, chills, weight change, fatigue, night sweats HEENT: Denies blurred vision, double vision, hearing loss, tinnitus, sinus congestion, rhinorrhea, sore throat, neck stiffness, dysphagia PULM: Denies shortness of breath, cough, sputum production, hemoptysis, wheezing CV: Denies chest pain, edema, orthopnea, paroxysmal nocturnal dyspnea, palpitations GI: abdominal pain, nausea, vomiting, diarrhea, hematochezia, melena, constipation, change in bowel habits GU: Denies dysuria, hematuria, polyuria, oliguria, urethral discharge Endocrine: Denies hot or cold intolerance, polyuria, polyphagia or appetite change Derm: Denies rash, dry skin, scaling or peeling skin change Heme: Denies easy bruising, bleeding, bleeding gums Neuro: headache, numbness, weakness, slurred speech,  loss of memory or consciousness  Past Medical History:  She,  has a past medical history of Depression, Diabetes mellitus type II, Hyperlipemia, Major depressive disorder, recurrent, in remission ([place]) (09/01/2006), Obesity, Osteopenia, and Syncope (1998).   Surgical History:   Past Surgical History:  Procedure Laterality Date   ABDOMINAL HYSTERECTOMY  ~2005   and BSO   BREAST BIOPSY Right 01/05/2019   rt stereo bx 2 areas 1st area ribbon clip   BREAST BIOPSY Right 01/05/2019   rt stereo bx 2nd area coil clip   CHOLECYSTECTOMY  1975   COSMETIC SURGERY     Injury Right face as a child   VAGINAL DELIVERY     X 2     Social History:   reports that she quit smoking about 27 years ago. Her smoking use included cigarettes. She has never used smokeless tobacco. She reports that she does not drink alcohol and does not use drugs.   Family History:  Her family history includes Breast cancer (age of onset: 37) in her sister; Cancer in her brother and sister; Heart disease in her brother and father; Stroke in her father.   Allergies Allergies  Allergen Reactions   Onglyza [Saxagliptin] Hives and Nausea And Vomiting   Liraglutide Nausea Only    Can't eat, nausea, higher sugars     Home Medications  Prior to Admission medications   Medication Sig Start Date End Date Taking? Authorizing Provider  Blood Glucose Monitoring Suppl ([place]) w/Device KIT Use to check blood sugar 2 times a day. 05/25/18   Philemon Kingdom, MD  glucose blood (ONETOUCH VERIO) test strip Use as instructed to check blood sugar 2 times a day. 04/02/20   Philemon Kingdom, MD  ibuprofen (ADVIL,MOTRIN) 200 MG tablet Take 200 mg by mouth every 6 (six) hours as needed.    [provider]  Lancets [facility] ULTRASOFT) lancets Use as instructed to check blood sugar 2 times a day. 04/02/20   Philemon Kingdom, MD  ondansetron (ZOFRAN-ODT) 4 MG disintegrating tablet Take 1 tablet (4 mg total) by  mouth every 8 (eight) hours as needed for nausea or vomiting. 11/05/20   Venia Carbon, MD  potassium chloride SA (KLOR-CON) 20 MEQ tablet Take 1 tablet (20 mEq total) by mouth 2 (two)  times daily. 11/05/20   Ria Bush, MD  venlafaxine XR (EFFEXOR-XR) 150 MG 24 hr capsule TAKE ONE CAPSULE BY MOUTH EVERY MORNING and TAKE ONE TABLET BY MOUTH EVERYDAY AT BEDTIME 10/27/20   Venia Carbon, MD     Critical care time: 51 minutes     Rosilyn Mings, [place] Pager [phone] (please enter 7 digits) PCCM Consult Pager [phone] (please enter 7 digits)

## 2020-11-16 NOTE — Progress Notes (Signed)
Secure chat with Dr Patsey Berthold, states the pt is likely to need TNA.  Plan on PICC placement 11/17/20.

## 2020-11-16 NOTE — ED Provider Notes (Signed)
[facility] Emergency Department Provider Note   ____________________________________________   Event Date/Time   First MD Initiated Contact with Patient 11/16/20 [phone]     (approximate)  I have reviewed the triage vital signs and the nursing notes.   HISTORY  Chief Complaint Altered Mental Status    HPI ARMANDA FORAND is a 70 y.o. female with past medical history of hyperlipidemia, diabetes, CKD, depression who presents to the ED for altered mental status.  History is limited as patient is unresponsive on arrival.  Per EMS, she was awake and alert with husband around 630 this morning.  About an hour later, she was then found by husband to be completely unresponsive.  When EMS arrived, patient noted to have dilated pupils and unresponsive to voice or pain.  Patient noted to be tachycardic with EMS and they had a difficult time obtaining blood pressure.  IO line was placed and patient was given small IV fluid bolus with slight improvement in her mental status.  Patient opens eyes to voice and able to follow simple commands on arrival to the ED.        Past Medical History:  Diagnosis Date   Depression    Diabetes mellitus type II    Hyperlipemia    Major depressive disorder, recurrent, in remission ([place]) 09/01/2006   Qualifier: Diagnosis of  By: Lelon Mast     Obesity    Osteopenia    Syncope 1998   DM diagnosis    Patient Active Problem List   Diagnosis Date Noted   Malnutrition of mild degree ([place]) 11/13/2020   Hypokalemia 11/13/2020   Nausea and vomiting 11/05/2020   Stage 3b chronic kidney disease ([place]) 11/05/2020   Memory loss 09/09/2020   Multiple fibroadenomata of both breasts 04/24/2019   MDD (major depressive disorder), recurrent episode, moderate ([place]) 08/09/2017   Osteopenia    Osteoarthritis of right knee 12/13/2016   Advance directive discussed with patient 12/13/2016   Type 2 diabetes, controlled, with peripheral neuropathy  ([place]) 02/15/2012   Routine general medical examination at a health care facility 05/03/2011   Class II obesity 05/03/2011   Hyperlipemia 09/01/2006    Past Surgical History:  Procedure Laterality Date   ABDOMINAL HYSTERECTOMY  ~2005   and BSO   BREAST BIOPSY Right 01/05/2019   rt stereo bx 2 areas 1st area ribbon clip   BREAST BIOPSY Right 01/05/2019   rt stereo bx 2nd area coil clip   CHOLECYSTECTOMY  1975   COSMETIC SURGERY     Injury Right face as a child   VAGINAL DELIVERY     X 2    Prior to Admission medications   Medication Sig Start Date End Date Taking? Authorizing Provider  Blood Glucose Monitoring Suppl ([place]) w/Device KIT Use to check blood sugar 2 times a day. 05/25/18   Philemon Kingdom, MD  glucose blood (ONETOUCH VERIO) test strip Use as instructed to check blood sugar 2 times a day. 04/02/20   Philemon Kingdom, MD  ibuprofen (ADVIL,MOTRIN) 200 MG tablet Take 200 mg by mouth every 6 (six) hours as needed.    [provider]  Lancets [facility] ULTRASOFT) lancets Use as instructed to check blood sugar 2 times a day. 04/02/20   Philemon Kingdom, MD  ondansetron (ZOFRAN-ODT) 4 MG disintegrating tablet Take 1 tablet (4 mg total) by mouth every 8 (eight) hours as needed for nausea or vomiting. 11/05/20   Venia Carbon, MD  potassium  chloride SA (KLOR-CON) 20 MEQ tablet Take 1 tablet (20 mEq total) by mouth 2 (two) times daily. 11/05/20   Ria Bush, MD  venlafaxine XR (EFFEXOR-XR) 150 MG 24 hr capsule TAKE ONE CAPSULE BY MOUTH EVERY MORNING and TAKE ONE TABLET BY MOUTH EVERYDAY AT BEDTIME 10/27/20   Venia Carbon, MD    Allergies Onglyza [saxagliptin] and Liraglutide  Family History  Problem Relation Age of Onset   Stroke Father        CVA   Heart disease Father        MI   Cancer Sister        breast cancer   Breast cancer Sister 83   Heart disease Brother        MI   Cancer Brother        Lung    Social  History Social History   Tobacco Use   Smoking status: Former    Types: Cigarettes    Quit date: 03/15/1993    Years since quitting: 27.6   Smokeless tobacco: Never  Substance Use Topics   Alcohol use: No    Alcohol/week: 0.0 standard drinks   Drug use: No    Review of Systems Unable to obtain secondary to altered mental status.  ____________________________________________   PHYSICAL EXAM:  VITAL SIGNS: ED Triage Vitals  Enc Vitals Group     BP 11/16/20 0806 (!) 56/34     Pulse Rate 11/16/20 0806 (!) 37     Resp 11/16/20 0806 (!) 27     Temp 11/16/20 0810 99.2 F (37.3 C)     Temp Source 11/16/20 0810 Rectal     SpO2 11/16/20 0806 94 %     Weight 11/16/20 0807 155 lb (70.3 kg)     Height 11/16/20 0807 $RemoveBefor'5\' 6"'[id number]$  (1.676 m)     Head Circumference --      Peak Flow --      Pain Score 11/16/20 0807 0     Pain Loc --      Pain Edu? --      Excl. in [place]? --     Constitutional: Somnolent, opens eyes to voice and follows simple commands. Eyes: Conjunctivae are normal.  Pupils 5 mm and reactive bilaterally. Head: Atraumatic. Nose: No congestion/rhinnorhea. Mouth/Throat: Mucous membranes are moist. Neck: Normal ROM Cardiovascular: Tachycardic, regular rhythm. Grossly normal heart sounds.  1+ radial and DP pulses bilaterally. Respiratory: Normal respiratory effort.  No retractions. Lungs CTAB. Gastrointestinal: Soft and nontender. No distention. Genitourinary: deferred Musculoskeletal: No lower extremity tenderness nor edema. Neurologic: Withdraws from pain in all 4 extremities, no spontaneous movements noted. Skin:  Skin is warm, dry and intact. No rash noted. Psychiatric: Unable to assess.  ____________________________________________   LABS (all labs ordered are listed, but only abnormal results are displayed)  Labs Reviewed  COMPREHENSIVE METABOLIC PANEL - Abnormal; Notable for the following components:      Result Value   Potassium 2.4 (*)    Chloride 90 (*)     Glucose, Bld 352 (*)    Creatinine, Ser 1.26 (*)    Total Protein 5.5 (*)    Albumin 3.2 (*)    AST 43 (*)    Total Bilirubin 1.8 (*)    GFR, Estimated 46 (*)    Anion gap 22 (*)    All other components within normal limits  LACTIC ACID, PLASMA - Abnormal; Notable for the following components:   Lactic Acid, Venous 9.5 (*)    All other  components within normal limits  CBC WITH DIFFERENTIAL/PLATELET - Abnormal; Notable for the following components:   WBC 13.0 (*)    Lymphs Abs 4.1 (*)    Monocytes Absolute 1.1 (*)    Abs Immature Granulocytes 0.11 (*)    All other components within normal limits  TROPONIN I (HIGH SENSITIVITY) - Abnormal; Notable for the following components:   Troponin I (High Sensitivity) 29 (*)    All other components within normal limits  RESP PANEL BY RT-PCR (FLU A&B, COVID) ARPGX2  CULTURE, BLOOD (ROUTINE X 2)  CULTURE, BLOOD (ROUTINE X 2)  PROTIME-INR  PROCALCITONIN  MAGNESIUM  LACTIC ACID, PLASMA  URINALYSIS, COMPLETE (UACMP) WITH MICROSCOPIC  BETA-HYDROXYBUTYRIC ACID  BLOOD GAS, VENOUS  TROPONIN I (HIGH SENSITIVITY)   ____________________________________________  EKG  ED ECG REPORT I, Blake Divine, the attending physician, personally viewed and interpreted this ECG.   Date: 11/16/2020  EKG Time: 8:08  Rate: 119  Rhythm: sinus tachycardia  Axis: LAD  Intervals:none  ST&T Change: None   PROCEDURES  Procedure(s) performed (including Critical Care):  .Critical Care  Date/Time: 11/16/2020 8:35 AM Performed by: Blake Divine, MD Authorized by: Blake Divine, MD   Critical care provider statement:    Critical care time (minutes):  45   Critical care time was exclusive of:  Separately billable procedures and treating other patients and teaching time   Critical care was necessary to treat or prevent imminent or life-threatening deterioration of the following conditions:  Shock   Critical care was time spent personally by me on the  following activities:  Discussions with consultants, evaluation of patient's response to treatment, examination of patient, ordering and performing treatments and interventions, ordering and review of laboratory studies, ordering and review of radiographic studies, pulse oximetry, re-evaluation of patient's condition, obtaining history from patient or surrogate and review of old charts   I assumed direction of critical care for this patient from another provider in my specialty: no     Care discussed with: admitting provider     ____________________________________________   INITIAL IMPRESSION / ASSESSMENT AND PLAN / ED COURSE      70 year old female with past medical history of hyperlipidemia, diabetes, CKD, depression who presents to the ED for acute altered mental status with unresponsiveness, after her husband had reportedly seen her normal at 630 this morning.  On arrival, patient remains minimally responsive but is able to open her eyes and follow simple commands.  She has equal smile bilaterally with no facial droop, withdraws from pain in all 4 extremities.  Low suspicion for stroke or other intracranial process given nonfocal neurologic exam.  I am more concerned for metabolic etiology, especially given she was seen by her PCP 3 days ago for poor p.o. intake and severe hypokalemia.  IV fluid resuscitation was initiated with 2 L of lactated Ringer's, patient's mental status rapidly improving since then and she is now able to answer all questions appropriately and move all 4 extremities equally.  Patient initially severely hypertensive on arrival, but this is improving with her mental status following IV fluids.  We will further assess for infectious process, EKG shows no evidence of ischemia and I have low suspicion for cardiac etiology.  Patient reports vomiting over the past couple of days and we will start potassium supplementation given her recent severe hypokalemia.  Patient remains awake  and alert following IV fluid administration, however MAP fluctuating downwards into the 50s and we will start on Levophed via her intraosseous line.  Labs confirm severe hypokalemia, we will replete via IV and attempt to give oral dose of potassium, also give supplemental magnesium.  Labs also remarkable for anion gap but no significant acidosis, low suspicion for DKA but we will add on beta hydroxybutyrate.  Lactate significantly elevated, likely related to patient's hypovolemic shock and there is no evidence of infectious process at this time.  Chest x-ray reviewed by me and shows no infiltrate, edema, or effusion, CT head is negative for acute process.  Given ongoing shock, case discussed with ICU provider for admission.      ____________________________________________   FINAL CLINICAL IMPRESSION(S) / ED DIAGNOSES  Final diagnoses:  Hypovolemic shock (HCC)  Hypokalemia  Non-intractable vomiting with nausea, unspecified vomiting type  Altered mental status, unspecified altered mental status type     ED Discharge Orders     None        Note:  This document was prepared using Dragon voice recognition software and may include unintentional dictation errors.    Blake Divine, MD 11/16/20 1025

## 2020-11-16 NOTE — ED Notes (Signed)
CBG 294

## 2020-11-16 NOTE — ED Notes (Signed)
Pt taken to CT. Will obtain second set of cultures when pt returns.

## 2020-11-16 NOTE — ED Notes (Signed)
Pt lying in bed asleep. Pt husband decided to leave and would like to be updated if anything changes. Pt levo stopped at this time with blood pressure maintaining at normal levels. Pt other vitals stable as well.

## 2020-11-16 NOTE — ED Notes (Signed)
Lab at bedside

## 2020-11-16 NOTE — ED Notes (Signed)
Husband stated to this RN pt has had trouble swallowing. Potassium pills crushed and attempted to take with applesauce. Pt was able to swallow but a minute later threw it back up.

## 2020-11-16 NOTE — ED Notes (Signed)
Critical lab values reported to Dr Charna Archer face to face:  Lactic 9.5 Potassium 2.4

## 2020-11-16 NOTE — ED Notes (Signed)
Pt arrived with stool on her. Cut pt clothes off with permission and threw away.

## 2020-11-16 NOTE — ED Notes (Addendum)
This RN attempted twice more to obtain second set of blood cultures. Unable to at this time. Called lab to draw cultures.

## 2020-11-16 NOTE — Progress Notes (Signed)
[place] for Electrolyte Monitoring and Replacement   Recent Labs: Potassium (mmol/L)  Date Value  11/16/2020 2.4 (LL)   Magnesium (mg/dL)  Date Value  11/16/2020 1.8   Calcium (mg/dL)  Date Value  11/16/2020 9.3   Albumin (g/dL)  Date Value  11/16/2020 3.2 (L)   Phosphorus (mg/dL)  Date Value  11/13/2020 3.4   Sodium (mmol/L)  Date Value  11/16/2020 138     Assessment: 70 y.o. female w/ PMH of HLD, DM, CKD who presents w/ AMS. Pharmacy has been asked to follow and replace electrolytes while in the CCU.  Goal of Therapy:  Electrolytes WNL  Plan:  2 grams IV magnesium sulfate x 1 per ED physician 10 mEq IV KCl x 4 per ED physician Lactated ringers MIVF w/ 20 mEq KCl/L at 125 mL/hr per NP Re-check electrolytes in am  Dallie Piles ,PharmD Clinical Pharmacist 11/16/2020 11:00 AM

## 2020-11-16 NOTE — ED Notes (Signed)
X-ray at bedside

## 2020-11-16 NOTE — ED Triage Notes (Signed)
Pt arrives emergency traffic via [facility]. EMS reports pt was alert at 6:30 with husband, between 7 and 7:30 pt went [place]. was in wheelchiar slumped over. when EMS arrived no radial pulses felt but felt carotid pulses. ST in route in 120's with EMS. recently treated for low potassium. clammy upon arrival. hx of DM. CBG 292 for EMS. EMS reports fixed pupils of 8-9 initially. states then decreased to 5 or 6. opening eyes upon arrival. isn't talking but nodded head when asked name. arrives with IO from EMS. Last EMS BP 111/80.

## 2020-11-16 NOTE — Progress Notes (Signed)
Secure chat with Anderson Malta RN re PICC order.  States has 2 PIV's and IO, the IO to be removed.  Notified the PICC will not be placed today.  If more IV access needed, please consult IV Team for PIV or midline vs placing a CVC if central access needed.  Levo currently charted at 46mg/min.

## 2020-11-17 ENCOUNTER — Other Ambulatory Visit: Payer: Self-pay

## 2020-11-17 DIAGNOSIS — R112 Nausea with vomiting, unspecified: Secondary | ICD-10-CM | POA: Diagnosis not present

## 2020-11-17 DIAGNOSIS — D1802 Hemangioma of intracranial structures: Secondary | ICD-10-CM | POA: Diagnosis not present

## 2020-11-17 LAB — HEPATIC FUNCTION PANEL
ALT: 13 U/L (ref 0–44)
AST: 32 U/L (ref 15–41)
Albumin: 2.5 g/dL — ABNORMAL LOW (ref 3.5–5.0)
Alkaline Phosphatase: 48 U/L (ref 38–126)
Bilirubin, Direct: 0.2 mg/dL (ref 0.0–0.2)
Indirect Bilirubin: 1.2 mg/dL — ABNORMAL HIGH (ref 0.3–0.9)
Total Bilirubin: 1.4 mg/dL — ABNORMAL HIGH (ref 0.3–1.2)
Total Protein: 4.2 g/dL — ABNORMAL LOW (ref 6.5–8.1)

## 2020-11-17 LAB — CBG MONITORING, ED
Glucose-Capillary: 116 mg/dL — ABNORMAL HIGH (ref 70–99)
Glucose-Capillary: 126 mg/dL — ABNORMAL HIGH (ref 70–99)
Glucose-Capillary: 138 mg/dL — ABNORMAL HIGH (ref 70–99)
Glucose-Capillary: 150 mg/dL — ABNORMAL HIGH (ref 70–99)
Glucose-Capillary: 185 mg/dL — ABNORMAL HIGH (ref 70–99)
Glucose-Capillary: 59 mg/dL — ABNORMAL LOW (ref 70–99)
Glucose-Capillary: 81 mg/dL (ref 70–99)
Glucose-Capillary: 94 mg/dL (ref 70–99)

## 2020-11-17 LAB — CORTISOL-AM, BLOOD: Cortisol - AM: 14 ug/dL (ref 6.7–22.6)

## 2020-11-17 LAB — URINALYSIS, COMPLETE (UACMP) WITH MICROSCOPIC
Glucose, UA: NEGATIVE mg/dL
Ketones, ur: 15 mg/dL — AB
Nitrite: POSITIVE — AB
Protein, ur: 30 mg/dL — AB
Specific Gravity, Urine: 1.02 (ref 1.005–1.030)
WBC, UA: >50 WBC/hpf (ref 0–5)
pH: 5.5 (ref 5.0–8.0)

## 2020-11-17 LAB — PROCALCITONIN: Procalcitonin: 7.45 ng/mL

## 2020-11-17 LAB — CBC
HCT: 28.3 % — ABNORMAL LOW (ref 36.0–46.0)
Hemoglobin: 10 g/dL — ABNORMAL LOW (ref 12.0–15.0)
MCH: 31 pg (ref 26.0–34.0)
MCHC: 35.3 g/dL (ref 30.0–36.0)
MCV: 87.6 fL (ref 80.0–100.0)
Platelets: 173 10*3/uL (ref 150–400)
RBC: 3.23 MIL/uL — ABNORMAL LOW (ref 3.87–5.11)
RDW: 14.4 % (ref 11.5–15.5)
WBC: 9 10*3/uL (ref 4.0–10.5)
nRBC: 0 % (ref 0.0–0.2)

## 2020-11-17 LAB — MAGNESIUM: Magnesium: 1.8 mg/dL (ref 1.7–2.4)

## 2020-11-17 LAB — BASIC METABOLIC PANEL
Anion gap: 7 (ref 5–15)
BUN: 14 mg/dL (ref 8–23)
CO2: 31 mmol/L (ref 22–32)
Calcium: 8.4 mg/dL — ABNORMAL LOW (ref 8.9–10.3)
Chloride: 97 mmol/L — ABNORMAL LOW (ref 98–111)
Creatinine, Ser: 0.95 mg/dL (ref 0.44–1.00)
GFR, Estimated: >60 mL/min (ref >60)
Glucose, Bld: 93 mg/dL (ref 70–99)
Potassium: 3.2 mmol/L — ABNORMAL LOW (ref 3.5–5.1)
Sodium: 135 mmol/L (ref 135–145)

## 2020-11-17 LAB — PHOSPHORUS: Phosphorus: 1.2 mg/dL — ABNORMAL LOW (ref 2.5–4.6)

## 2020-11-17 LAB — LIPASE, BLOOD: Lipase: 23 U/L (ref 11–51)

## 2020-11-17 MED ORDER — VENLAFAXINE HCL ER 150 MG PO CP24
150.0000 mg | ORAL_CAPSULE | Freq: Every day | ORAL | Status: DC
  Filled 2020-11-17: qty 1

## 2020-11-17 MED ORDER — DEXAMETHASONE SODIUM PHOSPHATE 10 MG/ML IJ SOLN
4.0000 mg | Freq: Three times a day (TID) | INTRAMUSCULAR | Status: DC
  Administered 2020-11-17 – 2020-11-18 (×3): 4 mg via INTRAVENOUS
  Filled 2020-11-17 (×3): qty 1

## 2020-11-17 MED ORDER — MAGNESIUM SULFATE 2 GM/50ML IV SOLN
2.0000 g | Freq: Once | INTRAVENOUS | Status: AC
  Administered 2020-11-17: 2 g via INTRAVENOUS
  Filled 2020-11-17: qty 50

## 2020-11-17 MED ORDER — DEXTROSE 50 % IV SOLN: 25.0000 mL | Freq: Once | INTRAVENOUS | Status: AC

## 2020-11-17 MED ORDER — POTASSIUM PHOSPHATES 15 MMOLE/5ML IV SOLN
30.0000 mmol | Freq: Once | INTRAVENOUS | Status: AC
  Administered 2020-11-17: 30 mmol via INTRAVENOUS
  Filled 2020-11-17: qty 10

## 2020-11-17 MED ORDER — POTASSIUM CHLORIDE CRYS ER 20 MEQ PO TBCR: 20.0000 meq | EXTENDED_RELEASE_TABLET | Freq: Once | ORAL | Status: DC

## 2020-11-17 MED ORDER — TRAVASOL 10 % IV SOLN
INTRAVENOUS | Status: DC
  Filled 2020-11-17: qty 282

## 2020-11-17 MED ORDER — DEXTROSE 50 % IV SOLN
INTRAVENOUS | Status: AC
  Administered 2020-11-17: 25 mL via INTRAVENOUS
  Filled 2020-11-17: qty 50

## 2020-11-17 MED ORDER — VENLAFAXINE HCL 37.5 MG PO TABS
75.0000 mg | ORAL_TABLET | Freq: Once | ORAL | Status: AC
  Administered 2020-11-18: 75 mg via ORAL
  Filled 2020-11-17: qty 2

## 2020-11-17 NOTE — ED Notes (Signed)
Pt Blood sugar 59 and pt refused oral juice or meal. Pt will be given Dextrose Iv per protocol.

## 2020-11-17 NOTE — Consult Note (Signed)
Cephas Darby, MD [address]  [place]  [place], [place] [address]  Main: [phone]  Fax: [phone] Pager: [phone]   Consultation  Referring Provider:     No ref. provider found Primary Care Physician:  Venia Carbon, MD Primary Gastroenterologist: Althia Forts     Reason for Consultation:     Intractable nausea and vomiting  Date of Admission:  11/16/2020 Date of Consultation:  11/17/2020         HPI:   Jacqueline Cole is a 70 y.o. female with history of depression, type 2 diabetes, obesity is admitted due to altered mental status.  She was found unresponsive by EMS.  Work-up revealed hemangioma 2.5 x 3.8 x 4 cm overlying the right frontal convexity confirmed on MRI.  No evidence of edema or significant regional mass-effect.     Patient's husband is bedside who provided most of the history. He said his wife was c/o chronic headaches, nausea and vomiting for over a month and she was not able to take her medications.  She was found to have severe hypokalemia potassium 2.4, elevated blood sugars 352 and hypotensive.  There was no evidence of AKI.  She was also started on vasopressors along with IV fluids and admitted to ICU.  Patient's nausea and vomiting have somewhat improved and started on clear liquid diet.  GI is consulted for evaluation of intractable nausea and vomiting. Patient is lethargic and hardly answers my questions, she dozes off quickly  NSAIDs: None  Antiplts/Anticoagulants/Anti thrombotics: None  GI Procedures: None  Past Medical History:  Diagnosis Date   Depression    Diabetes mellitus type II    Hyperlipemia    Major depressive disorder, recurrent, in remission ([place]) 09/01/2006   Qualifier: Diagnosis of  By: Lelon Mast     Obesity    Osteopenia    Syncope 1998   DM diagnosis    Past Surgical History:  Procedure Laterality Date   ABDOMINAL HYSTERECTOMY  ~2005   and BSO   BREAST BIOPSY Right 01/05/2019   rt stereo bx 2 areas  1st area ribbon clip   BREAST BIOPSY Right 01/05/2019   rt stereo bx 2nd area coil clip   CHOLECYSTECTOMY  1975   COSMETIC SURGERY     Injury Right face as a child   VAGINAL DELIVERY     X 2    Prior to Admission medications   Medication Sig Start Date End Date Taking? Authorizing Provider  diphenhydrAMINE (BENADRYL) 25 MG tablet Take 25 mg by mouth every 6 (six) hours as needed.   Yes [provider]  diphenhydramine-acetaminophen (TYLENOL PM) 25-500 MG TABS tablet Take 1 tablet by mouth at bedtime as needed.   Yes [provider]  gabapentin (NEURONTIN) 300 MG capsule Take 300 mg by mouth 2 (two) times daily.   Yes [provider]  hydrocortisone cream 1 % Apply 1 application topically daily as needed for itching.   Yes [provider]  ibuprofen (ADVIL,MOTRIN) 200 MG tablet Take 200-800 mg by mouth every 6 (six) hours as needed.   Yes [provider]  metFORMIN (GLUCOPHAGE) 1000 MG tablet Take 1,000 mg by mouth 2 (two) times daily with a meal.   Yes [provider]  ondansetron (ZOFRAN-ODT) 4 MG disintegrating tablet Take 1 tablet (4 mg total) by mouth every 8 (eight) hours as needed for nausea or vomiting. 11/05/20  Yes Venia Carbon, MD  Propylene Glycol (SYSTANE COMPLETE OP)  Place 3 drops into both eyes 3 (three) times daily.   Yes [provider]  rosuvastatin (CRESTOR) 10 MG tablet Take 10 mg by mouth daily.   Yes [provider]  trolamine salicylate (ASPERCREME) 10 % cream Apply 1 application topically as needed for muscle pain. Apply to feet   Yes [provider]  venlafaxine XR (EFFEXOR-XR) 150 MG 24 hr capsule TAKE ONE CAPSULE BY MOUTH EVERY MORNING and TAKE ONE TABLET BY MOUTH EVERYDAY AT BEDTIME Patient taking differently: Take 150 mg by mouth in the morning and at bedtime. 10/27/20  Yes Venia Carbon, MD  vitamin B-12 (CYANOCOBALAMIN) 500 MCG tablet Take 500 mcg by mouth daily.   Yes  [provider]  Blood Glucose Monitoring Suppl ([place]) w/Device KIT Use to check blood sugar 2 times a day. 05/25/18   Philemon Kingdom, MD  glucose blood (ONETOUCH VERIO) test strip Use as instructed to check blood sugar 2 times a day. 04/02/20   Philemon Kingdom, MD  Lancets [facility] ULTRASOFT) lancets Use as instructed to check blood sugar 2 times a day. 04/02/20   Philemon Kingdom, MD  potassium chloride SA (KLOR-CON) 20 MEQ tablet Take 1 tablet (20 mEq total) by mouth 2 (two) times daily. Patient not taking: Reported on 11/16/2020 11/05/20   Ria Bush, MD    Current Facility-Administered Medications:    0.9 %  sodium chloride infusion, 250 mL, Intravenous, Continuous, Blake Divine, MD   dexamethasone (DECADRON) injection 4 mg, 4 mg, Intravenous, TID, Ottie Glazier, MD   docusate sodium (COLACE) capsule 100 mg, 100 mg, Oral, BID PRN, Graves, Raeford Razor, NP   enoxaparin (LOVENOX) injection 40 mg, 40 mg, Subcutaneous, Q24H, Graves, Dana E, NP, 40 mg at 11/16/20 2220   hydrocortisone cream 1 % 1 application, 1 application, Topical, Daily PRN, Graves, Raeford Razor, NP   insulin aspart (novoLOG) injection 0-9 Units, 0-9 Units, Subcutaneous, Q4H, Graves, Dana E, NP, 3 Units at 11/16/20 1524   lactated ringers 1,000 mL with potassium chloride 20 mEq infusion, , Intravenous, Continuous, Graves, Raeford Razor, NP, Last Rate: 125 mL/hr at 11/17/20 1155, New Bag at 11/17/20 1155   norepinephrine (LEVOPHED) 4mg  in 215mL premix infusion, 2-10 mcg/min, Intravenous, Titrated, Graves, Raeford Razor, NP, Last Rate: 15 mL/hr at 11/17/20 1124, 4 mcg/min at 11/17/20 1124   ondansetron (ZOFRAN) injection 4 mg, 4 mg, Intravenous, Q6H PRN, Graves, Raeford Razor, NP   pantoprazole (PROTONIX) injection 40 mg, 40 mg, Intravenous, Q12H, Graves, Dana E, NP, 40 mg at 11/17/20 1012   polyethylene glycol (MIRALAX / GLYCOLAX) packet 17 g, 17 g, Oral, Daily PRN, Graves, Dana E, NP   potassium PHOSPHATE 30 mmol in  dextrose 5 % 500 mL infusion, 30 mmol, Intravenous, Once, Noralee Space, RPH, Last Rate: 85 mL/hr at 11/17/20 1217, 30 mmol at 11/17/20 1217   TPN ADULT (ION), , Intravenous, Continuous TPN, Noralee Space, RPH  Current Outpatient Medications:    diphenhydrAMINE (BENADRYL) 25 MG tablet, Take 25 mg by mouth every 6 (six) hours as needed., Disp: , Rfl:    diphenhydramine-acetaminophen (TYLENOL PM) 25-500 MG TABS tablet, Take 1 tablet by mouth at bedtime as needed., Disp: , Rfl:    gabapentin (NEURONTIN) 300 MG capsule, Take 300 mg by mouth 2 (two) times daily., Disp: , Rfl:    hydrocortisone cream 1 %, Apply 1 application topically daily as needed for itching., Disp: , Rfl:    ibuprofen (ADVIL,MOTRIN) 200 MG tablet, Take 200-800 mg  by mouth every 6 (six) hours as needed., Disp: , Rfl:    metFORMIN (GLUCOPHAGE) 1000 MG tablet, Take 1,000 mg by mouth 2 (two) times daily with a meal., Disp: , Rfl:    ondansetron (ZOFRAN-ODT) 4 MG disintegrating tablet, Take 1 tablet (4 mg total) by mouth every 8 (eight) hours as needed for nausea or vomiting., Disp: 30 tablet, Rfl: 0   Propylene Glycol (SYSTANE COMPLETE OP), Place 3 drops into both eyes 3 (three) times daily., Disp: , Rfl:    rosuvastatin (CRESTOR) 10 MG tablet, Take 10 mg by mouth daily., Disp: , Rfl:    trolamine salicylate (ASPERCREME) 10 % cream, Apply 1 application topically as needed for muscle pain. Apply to feet, Disp: , Rfl:    venlafaxine XR (EFFEXOR-XR) 150 MG 24 hr capsule, TAKE ONE CAPSULE BY MOUTH EVERY MORNING and TAKE ONE TABLET BY MOUTH EVERYDAY AT BEDTIME (Patient taking differently: Take 150 mg by mouth in the morning and at bedtime.), Disp: 180 capsule, Rfl: 0   vitamin B-12 (CYANOCOBALAMIN) 500 MCG tablet, Take 500 mcg by mouth daily., Disp: , Rfl:    Blood Glucose Monitoring Suppl (ONETOUCH VERIO FLEX SYSTEM) w/Device KIT, Use to check blood sugar 2 times a day., Disp: 1 kit, Rfl: 0   glucose blood (ONETOUCH VERIO) test  strip, Use as instructed to check blood sugar 2 times a day., Disp: 200 each, Rfl: 12   Lancets (ONETOUCH ULTRASOFT) lancets, Use as instructed to check blood sugar 2 times a day., Disp: 200 each, Rfl: 12   potassium chloride SA (KLOR-CON) 20 MEQ tablet, Take 1 tablet (20 mEq total) by mouth 2 (two) times daily. (Patient not taking: Reported on 11/16/2020), Disp: 10 tablet, Rfl: 0  Family History  Problem Relation Age of Onset   Stroke Father        CVA   Heart disease Father        MI   Cancer Sister        breast cancer   Breast cancer Sister 48   Heart disease Brother        MI   Cancer Brother        Lung     Social History   Tobacco Use   Smoking status: Former    Types: Cigarettes    Quit date: 03/15/1993    Years since quitting: 27.6   Smokeless tobacco: Never  Substance Use Topics   Alcohol use: No    Alcohol/week: 0.0 standard drinks   Drug use: No    Allergies as of 11/16/2020 - Review Complete 11/16/2020  Allergen Reaction Noted   Onglyza [saxagliptin] Hives and Nausea And Vomiting 11/26/2015   Liraglutide Nausea Only 07/07/2016    Review of Systems:    All systems reviewed and negative except where noted in HPI.   Physical Exam:  Vital signs in last 24 hours: Temp:  [98.6 F (37 C)-98.9 F (37.2 C)] 98.6 F (37 C) (09/05 0400) Pulse Rate:  [84-100] 87 (09/05 1025) Resp:  [11-26] 13 (09/05 1200) BP: (81-175)/(45-97) 126/51 (09/05 1200) SpO2:  [92 %-100 %] 97 % (09/05 1025) Last BM Date: 11/17/20 General:  lethargic, cooperative in NAD Head:  Normocephalic and atraumatic. Eyes:   No icterus.   Conjunctiva pink. PERRLA. Ears:  Normal auditory acuity. Neck:  Supple; no masses or thyroidomegaly Lungs: Respirations even and unlabored. Lungs clear to auscultation bilaterally.   No wheezes, crackles, or rhonchi.  Heart:  Regular rate and rhythm;  Without murmur,  clicks, rubs or gallops Abdomen:  Soft, nondistended, nontender. Normal bowel sounds. No  appreciable masses or hepatomegaly.  No rebound or guarding.  Rectal:  Not performed. Msk:  Symmetrical without gross deformities.   Extremities:  Without edema, cyanosis or clubbing. Neurologic:  Alert and oriented x1;  Skin:  Intact without significant lesions or rashes.  LAB RESULTS: CBC Latest Ref Rng & Units 11/17/2020 11/16/2020 11/16/2020  WBC 4.0 - 10.5 K/uL 9.0 20.5(H) 13.0(H)  Hemoglobin 12.0 - 15.0 g/dL 10.0(L) 12.5 13.6  Hematocrit 36.0 - 46.0 % 28.3(L) 36.0 39.7  Platelets 150 - 400 K/uL 173 218 243    BMET BMP Latest Ref Rng & Units 11/17/2020 11/16/2020 11/16/2020  Glucose 70 - 99 mg/dL 93 - -  BUN 8 - 23 mg/dL 14 - -  Creatinine 0.44 - 1.00 mg/dL 0.95 - 1.14(H)  Sodium 135 - 145 mmol/L 135 - -  Potassium 3.5 - 5.1 mmol/L 3.2(L) 3.1(L) -  Chloride 98 - 111 mmol/L 97(L) - -  CO2 22 - 32 mmol/L 31 - -  Calcium 8.9 - 10.3 mg/dL 8.4(L) - -    LFT Hepatic Function Latest Ref Rng & Units 11/17/2020 11/16/2020 11/13/2020  Total Protein 6.5 - 8.1 g/dL 4.2(L) 5.5(L) -  Albumin 3.5 - 5.0 g/dL 2.5(L) 3.2(L) 3.7  AST 15 - 41 U/L 32 43(H) -  ALT 0 - 44 U/L 13 12 -  Alk Phosphatase 38 - 126 U/L 48 66 -  Total Bilirubin 0.3 - 1.2 mg/dL 1.4(H) 1.8(H) -  Bilirubin, Direct 0.0 - 0.2 mg/dL 0.2 - -     STUDIES: CT ABDOMEN PELVIS WO CONTRAST  Result Date: 11/16/2020 CLINICAL DATA:  Unresponsive, diminished mental status, incontinence of stool, nausea, vomiting and lactic acidosis. EXAM: CT ABDOMEN AND PELVIS WITHOUT CONTRAST TECHNIQUE: Multidetector CT imaging of the abdomen and pelvis was performed following the standard protocol without IV contrast. COMPARISON:  None. FINDINGS: Lower chest: No acute abnormality. Hepatobiliary: No focal liver abnormality is seen. Status post cholecystectomy. No biliary dilatation. Pancreas: Unremarkable. No pancreatic ductal dilatation or surrounding inflammatory changes. Spleen: Normal in size without focal abnormality. Adrenals/Urinary Tract: Adrenal glands are  unremarkable. Kidneys are normal, without renal calculi, focal lesion, or hydronephrosis. Bladder is unremarkable. Stomach/Bowel: Bowel shows diverticulosis of the sigmoid colon without evidence of acute diverticulitis. No evidence of bowel obstruction, ileus, inflammatory process or free intraperitoneal air. No obvious focal bowel lesions by unenhanced CT. The appendix is not discretely visualized. No evidence of free intraperitoneal air. Vascular/Lymphatic: Atherosclerosis of the abdominal aorta and iliac arteries without evidence of aneurysm. No lymphadenopathy identified. Reproductive: Status post hysterectomy. No adnexal masses. Other: No abdominal wall hernia or abnormality. No evidence of focal abscess or ascites. Musculoskeletal: No acute or significant osseous findings. IMPRESSION: No acute findings in the abdomen or pelvis. Diverticulosis of the sigmoid colon without evidence of acute diverticulitis. Electronically Signed   By: Aletta Edouard M.D.   On: 11/16/2020 11:38   CT HEAD WO CONTRAST (5MM)  Result Date: 11/16/2020 CLINICAL DATA:  Mental status change, unknown cause EXAM: CT HEAD WITHOUT CONTRAST TECHNIQUE: Contiguous axial images were obtained from the base of the skull through the vertex without intravenous contrast. COMPARISON:  None. FINDINGS: Brain: There is a 3.8 x 2.3 x 2.2 cm extra-axial soft tissue mass in anterior right frontal region (series 2/image 23) with mild local mass effect on the right frontal lobe. No evidence of parenchymal hemorrhage. No midline shift. No CT evidence of acute infarction. Generalized  cerebral volume loss. Nonspecific mild to moderate subcortical and periventricular white matter hypodensity, most in keeping with chronic small vessel ischemic change. No ventriculomegaly. Vascular: No acute abnormality. Skull: No evidence of calvarial fracture. Sinuses/Orbits: The visualized paranasal sinuses are essentially clear. Other:  The mastoid air cells are unopacified.  IMPRESSION: 1. Extra-axial 3.8 x 2.3 x 2.2 cm soft tissue mass in the anterior right frontal region with mild local mass effect on the right frontal lobe, suspicious for a meningioma. MRI brain without and with IV contrast is recommended for further characterization. 2. Generalized cerebral volume loss and mild-to-moderate chronic small vessel ischemic changes in the cerebral white matter. 3. No evidence of acute intracranial abnormality. Electronically Signed   By: Ilona Sorrel M.D.   On: 11/16/2020 09:04   MR BRAIN W WO CONTRAST  Result Date: 11/16/2020 CLINICAL DATA:  Initial evaluation for meningioma. EXAM: MRI HEAD WITHOUT AND WITH CONTRAST TECHNIQUE: Multiplanar, multiecho pulse sequences of the brain and surrounding structures were obtained without and with intravenous contrast. CONTRAST:  6mL GADAVIST GADOBUTROL 1 MMOL/ML IV SOLN COMPARISON:  Prior head CT from earlier the same day. FINDINGS: Brain: Cerebral volume within normal limits for age. Patchy T2/FLAIR hyperintensity involving the periventricular and deep white matter both cerebral hemispheres as well as the pons, most consistent with chronic small vessel ischemic disease, moderate in nature. No evidence for acute or subacute infarct. Gray-white matter differentiation maintained. No encephalomalacia to suggest chronic cortical infarction. No acute intracranial hemorrhage. Single punctate chronic microhemorrhage noted within the subcortical right parietal lobe, of doubtful significance in isolation. 2.5 x 3.8 x 4.0 cm (craniocaudad by AP by transverse) enhancing extra-axial mass overlies the right frontal convexity, most consistent with a meningioma. No significant regional mass effect or vasogenic edema within the adjacent right frontal lobe. No other mass lesion or abnormal enhancement. No hydrocephalus or extra-axial fluid collection. Pituitary gland suprasellar region within normal limits. Midline structures intact and normal. Vascular: Major  intracranial vascular flow voids are maintained. Skull and upper cervical spine: Craniocervical junction within normal limits. Bone marrow signal intensity normal. No scalp soft tissue abnormality. Sinuses/Orbits: Globes and orbital soft tissues within normal limits. Paranasal sinuses are clear. Trace left mastoid effusion noted, of doubtful significance. Other: None. IMPRESSION: 1. 2.5 x 3.8 x 4.0 cm meningioma overlying the right frontal convexity. No associated edema or significant regional mass effect. 2. Underlying moderate chronic microvascular ischemic disease. Electronically Signed   By: Jeannine Boga M.D.   On: 11/16/2020 21:36   DG Chest Portable 1 View  Result Date: 11/16/2020 CLINICAL DATA:  Weakness and unresponsive. EXAM: PORTABLE CHEST 1 VIEW COMPARISON:  10/30/2020 FINDINGS: The heart size and mediastinal contours are within normal limits. There is no evidence of pulmonary edema, consolidation, pneumothorax obvious mass or pleural fluid. The visualized skeletal structures are unremarkable. IMPRESSION: No active disease. Electronically Signed   By: Aletta Edouard M.D.   On: 11/16/2020 08:57   [place] EKG SITE RITE  Result Date: 11/16/2020 If Site Rite image not attached, placement could not be confirmed due to current cardiac rhythm.     Impression / Plan:   Jacqueline Cole is a 70 y.o. female with history of diabetes, obesity, depression who is admitted with unresponsiveness secondary to dehydration, intractable nausea and vomiting with severe hypokalemia, imaging revealed right frontal lobe hemangioma  Intractable nausea and vomiting: Unclear etiology, ?secondary to intracranial lesion or erosive esophagitis or H. pylori gastritis or less likely gastric outlet obstruction There is no  evidence of AKI.  Hypokalemia has been corrected Last hemoglobin A1c was 5.5 on 09/01/2020, very likely secondary to diabetic gastroparesis Agree with scheduled Zofran and Phenergan every 6  hours Start Reglan 5 mg before each meal and at bedtime if needed Continue liquid diet as tolerated Continue pantoprazole 40 mg IV twice daily Recommend trial of dexamethasone for raised ICP in setting of new diagnosis of frontal lobe hemangioma Recommend neurosurgical/neurology evaluation Recommend upper endoscopy once patient recovers from hypotension and off pressors  Thank you for involving me in the care of this patient.    LOS: 1 day   Sherri Sear, MD  11/17/2020, 12:21 PM    Note: This dictation was prepared with Dragon dictation along with smaller phrase technology. Any transcriptional errors that result from this process are unintentional.

## 2020-11-17 NOTE — Progress Notes (Signed)
Patient up to [facility] with x1 assist Patient oriented to self; disoriented to time, situation, and place. Patient reports some lightheadedness when standing, but maintains balance and remains stable sitting on BSC.

## 2020-11-17 NOTE — Progress Notes (Signed)
PHARMACY - TOTAL PARENTERAL NUTRITION CONSULT NOTE   Indication: ongoing nausea and vomiting for the previous two weeks. Unable to tolerate any POs, including liquids  Patient Measurements: Height: '5\' 6"'$  (167.6 cm) Weight: 70.3 kg (155 lb) IBW/kg (Calculated) : 59.3 TPN AdjBW (KG): 70.3 Body mass index is 25.02 kg/m. Usual Weight:    Assessment: Patient presents with severe volume depletion in the setting of intractable nausea and vomiting that the patient has had for over a months duration. CT head is abnormal suggesting possible meningioma.Eval for gatroparesis/ PMH of DM  Glucose / Insulin: BG 59-126   SSI q4h ordered Electrolytes:  K 3.2  Phos 1.2:   Will order Potassium Phosphate 30 mmol IV x 1. Patient on LR w/ KCL 20 meq/L at 125 ml/hr Mag 1.8 : Will order Magnesium sulfate 2 gm IV x1 Renal: Scr 0.95 Hepatic: 32/13  TB 1.4 Intake / Output; MIVF: LR w/ KCL 20 meq/L at 125 ml/hr GI Imaging:Diverticulosis of the sigmoid colon without evidence of acute diverticulitis GI Surgeries / Procedures:   Central access: PICC for 9/5 TPN start date: 9/5 if gets PICC  Nutritional Goals: Goal TPN rate is 75 mL/hr (provides 84.6 g of protein and 1796 kcals per day)  RD Assessment: Estimated Needs Total Energy Estimated Needs: 1600-1800 kcal/d Total Protein Estimated Needs: 80-90 g/d Total Fluid Estimated Needs: 1.7-1.9L/d  Current Nutrition:  Clear liquids  Plan:  Start TPN at 1/3 of goal rate:  43m/hr at 1800 (pt at risk for refeeding) -Nutritional components at goal rate Amino acids (Travasol 10%): 84.6 grams/day Lipids (20% SMOF): 54 grams/day Dextrose: 270 grams/day- 15% kCal: 1796 / 24h -Electrolytes in TPN: Na 530m/L, K 5030mL, Ca 5mE42m, Mg 5mEq59m and Phos 15mmo9m Cl:Ac 1:1 Add standard MVI and trace elements to TPN -add thiamine and folic acid to TPN x 3 days- start 9/5 -Initiate Sensitive q4h SSI and adjust as needed  -Reduce MIVF to      mL/hr at  1800 Monitor TPN labs on Mon/Thurs, x 3 days at start  Akari Defelice A 11/17/2020,11:40 AM
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
## 2020-11-17 NOTE — ED Notes (Signed)
Pt woke this morning and more confused than last night. Husband stated " this is what  we have been dealing with for weeks now". Pt slept well last night. Up with assist to [facility] multiple time to void and has BM also. T.MX 98.6. Husband  Been at bedside all night.

## 2020-11-17 NOTE — ED Notes (Signed)
Levo gtt restarted due pt unable to maintain BP parameter as ordered.

## 2020-11-17 NOTE — Progress Notes (Signed)
Spoke with Denice Paradise, RN (ED) who reports TPN has not been mixed and the plan is to hold off on the PICC for now. Providers will re-evaluate in the morning (9/6).

## 2020-11-17 NOTE — Progress Notes (Signed)
Initial Nutrition Assessment  DOCUMENTATION CODES:  Not applicable  INTERVENTION:  Continue current diet as tolerated, advance if able TPN per pharmacy (estimated needs below) Pt at high risk for refeeding, would recommend adding folic acid ('1mg'$ /d) and thiamine ('100mg'$ /d) x 3 days and monitor labs for signs of K, phosphorus, and Mg depletion. Standard MVI and trace elements in TPN Daily weights Consider adding nutrition supplement if pt able to tolerate PO  NUTRITION DIAGNOSIS:  Inadequate oral intake related to altered GI function as evidenced by  (nausea/vomiting, inability to eat).  GOAL:  Patient will meet greater than or equal to 90% of their needs  MONITOR:  PO intake, I & O's, Diet advancement, Labs  REASON FOR ASSESSMENT:  Consult New TPN/TNA  ASSESSMENT:  70 y.o. female with history of HLD, diabetes, CKD3, depression, and osteopenia who presents to the ED for AMS. Pt was found unresponsive by her husband. Pt recently seen by PCP for poor PO intake and hypokalemia in the setting of ongoing nausea and vomiting.  Pt remains in ED at the time of assessment, awaiting room availability in ICU. Confusion persisted overnight and pt continues to require low dose pressor to maintain BP.  Reviewed chart, seen initially at PCP 8/24 with complaints of ongoing nausea and vomiting for the previous two weeks. Unable to tolerate any POs, including liquids. K has been critically low (2.5-2.6) for the last two weeks at PCP office. MD recommends PICC placement for TPN administration as pt has been unable to tolerate PO for a significant amount of time. Diet advanced to a clear liquid diet this AM, but abnormal labs persist and pt vomited last night. Will provide recommendations for TPN below. Pt is at high risk for refeeding.  Reviewed weights, current weight appears stated and last weight from PCP office 9/1 significantly lower. Will base estimated needs on 9/1 weight until new measured is  obtained. Per chart, weight loss has been occurring for ~ 1 year, 21.8% weight loss noted over the last 6 months (3/18-9/1). Acute weight loss of 1.3% seen in the last week per PCP weights.   Intake/Output Summary (Last 24 hours) at 11/17/2020 0946 Last data filed at 11/17/2020 [id number] Gross per 24 hour  Intake 4704.89 ml  Output 200 ml  Net 4504.89 ml  Net IO Since Admission: 5,604.89 mL [11/17/20 0946]  Nutritionally Relevant Medications: Scheduled Meds:  insulin aspart  0-9 Units Subcutaneous Q4H   pantoprazole (PROTONIX) IV  40 mg Intravenous Q12H   potassium chloride  20 mEq Oral Once   thiamine injection  100 mg Intravenous Daily   Continuous Infusions:  magnesium sulfate bolus IVPB     norepinephrine (LEVOPHED) Adult infusion 5 mcg/min (11/17/20 0735)   potassium PHOSPHATE IVPB (in mmol)     PRN Meds:.docusate sodium, ondansetron, polyethylene glycol  Labs Reviewed: K 3.2 Phosphorus 1.2 Mg 1.8 SBG ranges from 59-233 mg/dL over the last 24 hours HgbA1c 5.5% (6/28)  NUTRITION - FOCUSED PHYSICAL EXAM: Defer to in-person assessment  Diet Order:   Diet Order             Diet clear liquid Room service appropriate? Yes; Fluid consistency: Thin  Diet effective now                   EDUCATION NEEDS:  No education needs have been identified at this time  Skin:  Skin Assessment:  (no assessment to review at this time)  Last BM:  9/5 - type 6  Height:  Ht Readings from Last 1 Encounters:  11/16/20 '5\' 6"'$  (1.676 m)    Weight:  Wt Readings from Last 1 Encounters:  11/16/20 70.3 kg    Ideal Body Weight:  59.1 kg  BMI:  Body mass index is 25.02 kg/m.  Estimated Nutritional Needs:  Kcal:  1600-1800 kcal/d Protein:  80-90 g/d Fluid:  1.7-1.9L/d   Ranell Patrick, RD, LDN Clinical Dietitian Pager on [place]

## 2020-11-17 NOTE — Progress Notes (Signed)
NAME:  Jacqueline Cole, MRN:  [id number], DOB:  1950/10/03, LOS: 1 ADMISSION DATE:  11/16/2020, CONSULTATION DATE: 11/16/2020 REFERRING MD: Dr. Larinda Buttery, CHIEF COMPLAINT: Nausea/Vomiting    History of Present Illness:  This is a 70 yo female with a PMH of Type II Diabetes Mellitus who presented to [facility] ER on 09/4 via EMS with unresponsiveness, last known well time 06:30 am on 09/4.  Per EMS run sheet upon arrival at pts home she was found slumped down in her wheelchair unresponsive, but they were able to palpate a weak carotid pulse.  EMS EKG revealed sinus tachycardia, but no ST-elevation.  EMS unable to obtain an automatic or manual bp initially, therefore iv fluid bolus administered.  En route to the ER pt became alert and responsive with garbled speech.    ED course Upon arrival to the ER pt minimally responsive, but able to open her eyes and follow commands with no other neurological deficits.  CT Head negative for acute intracranial abnormality, however concerning for meningioma recommendation MRI Brain with/ without iv contrast for further characterization.  Pt has had continued nausea/vomiting for over a month.  She has been unable to take her po medications including her metformin.  She was seen by her PCP on 09/1 due to poor po intake and severe hypokalemia.  In the ER labs revealed K+ 2.4, chloride 90, glucose 352, creatinine 1.26, anion gap 22, albumin 3.2, AST 43, wbc 13.0, troponin 39, lactic acid 9.5, and pct <0.10.  Pt continued to have severe  nausea/vomiting and became hypotensive.  She received 2L LR bolus, but remained hypotensive requiring peripheral levophed gtt.  PCCM contacted for ICU admission for additional workup and treatment.    11/17/20- met with husband at bedside to review findings and medical plan. Patient examined at bedside with improved sensorium.  Remains on low dose vasopressor support. Loose stools have slowed down, vomiting improved patient asking for meal and remorseful  for aggitated behavior overnight.    Pertinent  Medical History  Depression  Type II Diabetes Mellitus  HLD Obesity  Osteopenia  Syncope   Significant Hospital Events: Including procedures, antibiotic start and stop dates in addition to other pertinent events   09/4: Pt admitted to ICU with hypovolemic shock due to severe  nausea/vomiting requiring peripheral levophed gtt 09/4: CT Head: revealed Extra-axial 3.8 x 2.3 x 2.2 cm soft tissue mass in the anterior right frontal region with mild local mass effect on the right frontal lobe, suspicious for a meningioma. MRI brain without and with IV contrast is recommended for further characterization. Generalized cerebral volume loss and mild-to-moderate chronic small vessel ischemic changes in the cerebral white matter. No evidence of acute intracranial abnormality. 09/4: CT Abd Pelvis: No acute findings in the abdomen or pelvis. Diverticulosis of the sigmoid colon without evidence of acute diverticulitis.  09/4 MR Brain:    Interim History / Subjective:  Pt resting in bed no complaints asking can she drink water.  States her nausea is a lot better.  She has not had a BM in 3 weeks, but had a BM today.   Objective   Blood pressure (!) 117/56, pulse 94, temperature 98.6 F (37 C), temperature source Oral, resp. rate 15, height 5\' 6"  (1.676 m), weight 70.3 kg, SpO2 97 %.        Intake/Output Summary (Last 24 hours) at 11/17/2020 0746 Last data filed at 11/17/2020 0735 Gross per 24 hour  Intake 5804.89 ml  Output 200 ml  Net 5604.89 ml    Filed Weights   11/16/20 0807  Weight: 70.3 kg    Examination: General: acutely ill appearing female, resting in bed NAD  HENT: supple, no JVD  Lungs: clear throughout, even, non labored  Cardiovascular: nsr, rrr, no R/G, 2+ radial/1+ distal pulses, no edema  Abdomen: +BS x4, obese, soft, non tender, non distended  Extremities: normal bulk and tone, moves all extremities  Neuro: alert and oriented,  follows commands  GU: voiding urine   Resolved Hospital Problem list     Assessment & Plan:   Hypovolemic shock secondary to vomiting possible due to infectius etiology such as viral gastroenteritis           -stool PCR           - legionella            -CT abd reviewed - no acute pathology , +old diverticulosis without diverticulitis - Continuous telemetry monitoring  - Aggressive fluid resuscitation and prn levophed gtt to maintain map >65 - TSH, Free T4, and cortisol level pending   Acute kidney injury secondary to ATN  Hypokalemia and hypomagnesia  Lactic acidosis: 9.5~5.1 - Trend BMP  - Replace electrolytes as indicated  - Monitor UOP - Avoid nephrotoxic medications  - LR with 20 meq KCL $Remov'@125'tZJYnR$  ml/hr   Nausea/Vomiting suspected secondary to gastroparesis although CT Head concerning for possible meningioma  Gastroparesis  - Sips of water only  - Prn zofran for nausea/vomiting - Scheduled protonix  - GI consulted appreciate input  - Gastric emptying study pending   Type II Diabetes Mellitus  - Hemoglobin A1c pending - CBG's q4hrs - SSI    Acute encephalopathy likely secondary to hypotension  Suspected meningioma per CT Head 09/4 - MR Brain with/without contrast  - Frequent reorientation  - Avoid sedating medications when able   Best Practice (right click and "Reselect all SmartList Selections" daily)   Diet/type: Sips of water only  DVT prophylaxis: LMWH GI prophylaxis: PPI Lines: PICC line placement pending  Foley:  N/A Code Status:  full code Last date of multidisciplinary goals of care discussion [N/A]  Updated pt and pts husband regarding plan of care and all questions were answered 11/16/20 Labs   CBC: Recent Labs  Lab 11/16/20 0810 11/16/20 1148 11/17/20 0604  WBC 13.0* 20.5* 9.0  NEUTROABS 7.4  --   --   HGB 13.6 12.5 10.0*  HCT 39.7 36.0 28.3*  MCV 88.8 87.6 87.6  PLT 243 218 173     Basic Metabolic Panel: Recent Labs  Lab  11/13/20 1510 11/16/20 0805 11/16/20 0810 11/16/20 1148 11/16/20 1529 11/17/20 0604  NA 136  --  138  --   --  135  K 2.5*  --  2.4*  --  3.1* 3.2*  CL 85*  --  90*  --   --  97*  CO2 34*  --  26  --   --  31  GLUCOSE 213*  --  352*  --   --  93  BUN 16  --  17  --   --  14  CREATININE 0.93  --  1.26* 1.14*  --  0.95  CALCIUM 9.8  --  9.3  --   --  8.4*  MG  --  1.8  --   --  1.8 1.8  PHOS 3.4  --   --  3.4  --  1.2*    GFR: Estimated Creatinine Clearance: 52.3 mL/min (  by C-G formula based on SCr of 0.95 mg/dL). Recent Labs  Lab 11/16/20 0805 11/16/20 0810 11/16/20 1021 11/16/20 1148 11/16/20 1538 11/17/20 0604  PROCALCITON <0.10  --   --   --   --   --   WBC  --  13.0*  --  20.5*  --  9.0  LATICACIDVEN 9.5*  --  5.1*  --  1.7  --      Liver Function Tests: Recent Labs  Lab 11/13/20 1510 11/16/20 0810 11/17/20 0604  AST  --  43* 32  ALT  --  12 13  ALKPHOS  --  66 48  BILITOT  --  1.8* 1.4*  PROT  --  5.5* 4.2*  ALBUMIN 3.7 3.2* 2.5*    No results for input(s): LIPASE, AMYLASE in the last 168 hours. No results for input(s): AMMONIA in the last 168 hours.  ABG    Component Value Date/Time   HCO3 30.4 (H) 11/16/2020 0946   O2SAT 57.6 11/16/2020 0946      Coagulation Profile: Recent Labs  Lab 11/16/20 0810  INR 1.0     Cardiac Enzymes: No results for input(s): CKTOTAL, CKMB, CKMBINDEX, TROPONINI in the last 168 hours.  HbA1C: Hemoglobin A1C  Date/Time Value Ref Range Status  05/30/2020 09:01 AM 5.9 (A) 4.0 - 5.6 % Final  02/04/2020 10:08 AM 6.0 (A) 4.0 - 5.6 % Corrected   Hgb A1c MFr Bld  Date/Time Value Ref Range Status  09/09/2020 10:06 AM 5.5 4.6 - 6.5 % Final    Comment:    Glycemic Control Guidelines for People with Diabetes:Non Diabetic:  <6%Goal of Therapy: <7%Additional Action Suggested:  >8%   10/13/2016 12:13 PM 8.2 (H) 4.6 - 6.5 % Final    Comment:    Glycemic Control Guidelines for People with Diabetes:Non Diabetic:  <6%Goal  of Therapy: <7%Additional Action Suggested:  >8%     CBG: Recent Labs  Lab 11/16/20 2204 11/16/20 2242 11/17/20 0002 11/17/20 0427 11/17/20 0455  GLUCAP 68* 77 81 59* 126*    Review of Systems: Positives in BOLD   Gen: Denies fever, chills, weight change, fatigue, night sweats HEENT: Denies blurred vision, double vision, hearing loss, tinnitus, sinus congestion, rhinorrhea, sore throat, neck stiffness, dysphagia PULM: Denies shortness of breath, cough, sputum production, hemoptysis, wheezing CV: Denies chest pain, edema, orthopnea, paroxysmal nocturnal dyspnea, palpitations GI: abdominal pain, nausea, vomiting, diarrhea, hematochezia, melena, constipation, change in bowel habits GU: Denies dysuria, hematuria, polyuria, oliguria, urethral discharge Endocrine: Denies hot or cold intolerance, polyuria, polyphagia or appetite change Derm: Denies rash, dry skin, scaling or peeling skin change Heme: Denies easy bruising, bleeding, bleeding gums Neuro: headache, numbness, weakness, slurred speech, loss of memory or consciousness  Past Medical History:  She,  has a past medical history of Depression, Diabetes mellitus type II, Hyperlipemia, Major depressive disorder, recurrent, in remission ([place]) (09/01/2006), Obesity, Osteopenia, and Syncope (1998).   Surgical History:   Past Surgical History:  Procedure Laterality Date   ABDOMINAL HYSTERECTOMY  ~2005   and BSO   BREAST BIOPSY Right 01/05/2019   rt stereo bx 2 areas 1st area ribbon clip   BREAST BIOPSY Right 01/05/2019   rt stereo bx 2nd area coil clip   CHOLECYSTECTOMY  1975   COSMETIC SURGERY     Injury Right face as a child   VAGINAL DELIVERY     X 2     Social History:   reports that she quit smoking about 27 years ago. Her  smoking use included cigarettes. She has never used smokeless tobacco. She reports that she does not drink alcohol and does not use drugs.   Family History:  Her family history includes Breast cancer  (age of onset: 12) in her sister; Cancer in her brother and sister; Heart disease in her brother and father; Stroke in her father.   Allergies Allergies  Allergen Reactions   Onglyza [Saxagliptin] Hives and Nausea And Vomiting   Liraglutide Nausea Only    Can't eat, nausea, higher sugars     Home Medications  Prior to Admission medications   Medication Sig Start Date End Date Taking? Authorizing Provider  Blood Glucose Monitoring Suppl ([place]) w/Device KIT Use to check blood sugar 2 times a day. 05/25/18   Philemon Kingdom, MD  glucose blood (ONETOUCH VERIO) test strip Use as instructed to check blood sugar 2 times a day. 04/02/20   Philemon Kingdom, MD  ibuprofen (ADVIL,MOTRIN) 200 MG tablet Take 200 mg by mouth every 6 (six) hours as needed.    [provider]  Lancets [facility] ULTRASOFT) lancets Use as instructed to check blood sugar 2 times a day. 04/02/20   Philemon Kingdom, MD  ondansetron (ZOFRAN-ODT) 4 MG disintegrating tablet Take 1 tablet (4 mg total) by mouth every 8 (eight) hours as needed for nausea or vomiting. 11/05/20   Venia Carbon, MD  potassium chloride SA (KLOR-CON) 20 MEQ tablet Take 1 tablet (20 mEq total) by mouth 2 (two) times daily. 11/05/20   Ria Bush, MD  venlafaxine XR (EFFEXOR-XR) 150 MG 24 hr capsule TAKE ONE CAPSULE BY MOUTH EVERY MORNING and TAKE ONE TABLET BY MOUTH EVERYDAY AT BEDTIME 10/27/20   Venia Carbon, MD     Critical care provider statement:   Total critical care time: 33 minutes   Performed by: Lanney Gins MD   Critical care time was exclusive of separately billable procedures and treating other patients.   Critical care was necessary to treat or prevent imminent or life-threatening deterioration.   Critical care was time spent personally by me on the following activities: development of treatment plan with patient and/or surrogate as well as nursing, discussions with consultants, evaluation of  patient's response to treatment, examination of patient, obtaining history from patient or surrogate, ordering and performing treatments and interventions, ordering and review of laboratory studies, ordering and review of radiographic studies, pulse oximetry and re-evaluation of patient's condition.    Ottie Glazier, M.D.  Pulmonary & Critical Care Medicine

## 2020-11-18 ENCOUNTER — Inpatient Hospital Stay: Payer: PPO | Admitting: Anesthesiology

## 2020-11-18 ENCOUNTER — Encounter
Admission: EM | Discharge status: Against Medical Advice | Payer: Self-pay | Source: Home / Self Care | Attending: Pulmonary Disease

## 2020-11-18 ENCOUNTER — Encounter: Payer: Self-pay | Admitting: Pulmonary Disease

## 2020-11-18 DIAGNOSIS — R112 Nausea with vomiting, unspecified: Secondary | ICD-10-CM | POA: Diagnosis not present

## 2020-11-18 DIAGNOSIS — D320 Benign neoplasm of cerebral meninges: Secondary | ICD-10-CM

## 2020-11-18 HISTORY — PX: ESOPHAGOGASTRODUODENOSCOPY (EGD) WITH PROPOFOL: SHX5813

## 2020-11-18 LAB — COMPREHENSIVE METABOLIC PANEL
ALT: 13 U/L (ref 0–44)
AST: 23 U/L (ref 15–41)
Albumin: 2.8 g/dL — ABNORMAL LOW (ref 3.5–5.0)
Alkaline Phosphatase: 51 U/L (ref 38–126)
Anion gap: 7 (ref 5–15)
BUN: 13 mg/dL (ref 8–23)
CO2: 31 mmol/L (ref 22–32)
Calcium: 8.5 mg/dL — ABNORMAL LOW (ref 8.9–10.3)
Chloride: 99 mmol/L (ref 98–111)
Creatinine, Ser: 0.76 mg/dL (ref 0.44–1.00)
GFR, Estimated: >60 mL/min (ref >60)
Glucose, Bld: 151 mg/dL — ABNORMAL HIGH (ref 70–99)
Potassium: 4.4 mmol/L (ref 3.5–5.1)
Sodium: 137 mmol/L (ref 135–145)
Total Bilirubin: 1.4 mg/dL — ABNORMAL HIGH (ref 0.3–1.2)
Total Protein: 4.6 g/dL — ABNORMAL LOW (ref 6.5–8.1)

## 2020-11-18 LAB — CBC WITH DIFFERENTIAL/PLATELET
Abs Immature Granulocytes: 0.04 10*3/uL (ref 0.00–0.07)
Basophils Absolute: 0 10*3/uL (ref 0.0–0.1)
Basophils Relative: 0 %
Eosinophils Absolute: 0 10*3/uL (ref 0.0–0.5)
Eosinophils Relative: 0 %
HCT: 29.2 % — ABNORMAL LOW (ref 36.0–46.0)
Hemoglobin: 10.4 g/dL — ABNORMAL LOW (ref 12.0–15.0)
Immature Granulocytes: 1 %
Lymphocytes Relative: 9 %
Lymphs Abs: 0.5 10*3/uL — ABNORMAL LOW (ref 0.7–4.0)
MCH: 31.1 pg (ref 26.0–34.0)
MCHC: 35.6 g/dL (ref 30.0–36.0)
MCV: 87.4 fL (ref 80.0–100.0)
Monocytes Absolute: 0.2 10*3/uL (ref 0.1–1.0)
Monocytes Relative: 4 %
Neutro Abs: 5.3 10*3/uL (ref 1.7–7.7)
Neutrophils Relative %: 86 %
Platelets: 174 10*3/uL (ref 150–400)
RBC: 3.34 MIL/uL — ABNORMAL LOW (ref 3.87–5.11)
RDW: 14.9 % (ref 11.5–15.5)
WBC: 6.1 10*3/uL (ref 4.0–10.5)
nRBC: 0 % (ref 0.0–0.2)

## 2020-11-18 LAB — CBG MONITORING, ED
Glucose-Capillary: 294 mg/dL — ABNORMAL HIGH (ref 70–99)
Glucose-Capillary: 177 mg/dL — ABNORMAL HIGH (ref 70–99)
Glucose-Capillary: 122 mg/dL — ABNORMAL HIGH (ref 70–99)

## 2020-11-18 LAB — PHOSPHORUS: Phosphorus: 3.6 mg/dL (ref 2.5–4.6)

## 2020-11-18 LAB — HEMOGLOBIN A1C
Hgb A1c MFr Bld: 5.5 % (ref 4.8–5.6)
Mean Plasma Glucose: 111 mg/dL

## 2020-11-18 LAB — PROCALCITONIN: Procalcitonin: 3.2 ng/mL

## 2020-11-18 LAB — TRIGLYCERIDES: Triglycerides: 120 mg/dL (ref <150)

## 2020-11-18 LAB — MAGNESIUM: Magnesium: 2 mg/dL (ref 1.7–2.4)

## 2020-11-18 SURGERY — ESOPHAGOGASTRODUODENOSCOPY (EGD) WITH PROPOFOL
Anesthesia: General

## 2020-11-18 MED ORDER — PROPOFOL 500 MG/50ML IV EMUL
INTRAVENOUS | Status: DC | PRN
  Administered 2020-11-18: 120 ug/kg/min via INTRAVENOUS

## 2020-11-18 MED ORDER — LIDOCAINE 2% (20 MG/ML) 5 ML SYRINGE
INTRAMUSCULAR | Status: DC | PRN
  Administered 2020-11-18: 20 mg via INTRAVENOUS

## 2020-11-18 MED ORDER — SODIUM CHLORIDE 0.9 % IV SOLN: INTRAVENOUS | Status: DC

## 2020-11-18 MED ORDER — VENLAFAXINE HCL ER 75 MG PO CP24
75.0000 mg | ORAL_CAPSULE | Freq: Every day | ORAL | Status: DC
  Filled 2020-11-18: qty 1

## 2020-11-18 MED ORDER — PROPOFOL 10 MG/ML IV BOLUS
INTRAVENOUS | Status: DC | PRN
  Administered 2020-11-18: 50 mg via INTRAVENOUS

## 2020-11-18 MED ORDER — ALPRAZOLAM 0.25 MG PO TABS
0.2500 mg | ORAL_TABLET | Freq: Once | ORAL | Status: AC
  Administered 2020-11-18: 0.25 mg via ORAL
  Filled 2020-11-18: qty 1

## 2020-11-18 MED ORDER — LACTATED RINGERS IV SOLN: INTRAVENOUS | Status: DC

## 2020-11-18 NOTE — H&P (Signed)
Jonathon Bellows, MD [address], [place], [place], [place], [address] [address], [place], [place], [place], [address] Phone: [phone]  Fax: [phone]  Primary Care Physician:  Venia Carbon, MD   Pre-Procedure History & Physical: HPI:  Jacqueline Cole is a 70 y.o. female is here for an endoscopy    Past Medical History:  Diagnosis Date   Depression    Diabetes mellitus type II    Hyperlipemia    Major depressive disorder, recurrent, in remission ([place]) 09/01/2006   Qualifier: Diagnosis of  By: Lelon Mast     Obesity    Osteopenia    Syncope 1998   DM diagnosis    Past Surgical History:  Procedure Laterality Date   ABDOMINAL HYSTERECTOMY  ~2005   and BSO   BREAST BIOPSY Right 01/05/2019   rt stereo bx 2 areas 1st area ribbon clip   BREAST BIOPSY Right 01/05/2019   rt stereo bx 2nd area coil clip   [place]     Injury Right face as a child   VAGINAL DELIVERY     X 2    Prior to Admission medications   Medication Sig Start Date End Date Taking? Authorizing Provider  diphenhydrAMINE (BENADRYL) 25 MG tablet Take 25 mg by mouth every 6 (six) hours as needed.   Yes [provider]  diphenhydramine-acetaminophen (TYLENOL PM) 25-500 MG TABS tablet Take 1 tablet by mouth at bedtime as needed.   Yes [provider]  gabapentin (NEURONTIN) 300 MG capsule Take 300 mg by mouth 2 (two) times daily.   Yes [provider]  hydrocortisone cream 1 % Apply 1 application topically daily as needed for itching.   Yes [provider]  ibuprofen (ADVIL,MOTRIN) 200 MG tablet Take 200-800 mg by mouth every 6 (six) hours as needed.   Yes [provider]  metFORMIN (GLUCOPHAGE) 1000 MG tablet Take 1,000 mg by mouth 2 (two) times daily with a meal.   Yes [provider]  ondansetron (ZOFRAN-ODT) 4 MG disintegrating tablet Take 1 tablet (4 mg total) by mouth every 8 (eight) hours as needed  for nausea or vomiting. 11/05/20  Yes Venia Carbon, MD  Propylene Glycol (SYSTANE COMPLETE OP) Place 3 drops into both eyes 3 (three) times daily.   Yes [provider]  rosuvastatin (CRESTOR) 10 MG tablet Take 10 mg by mouth daily.   Yes [provider]  trolamine salicylate (ASPERCREME) 10 % cream Apply 1 application topically as needed for muscle pain. Apply to feet   Yes [provider]  venlafaxine XR (EFFEXOR-XR) 150 MG 24 hr capsule TAKE ONE CAPSULE BY MOUTH EVERY MORNING and TAKE ONE TABLET BY MOUTH EVERYDAY AT BEDTIME Patient taking differently: Take 150 mg by mouth in the morning and at bedtime. 10/27/20  Yes Venia Carbon, MD  vitamin B-12 (CYANOCOBALAMIN) 500 MCG tablet Take 500 mcg by mouth daily.   Yes [provider]  Blood Glucose Monitoring Suppl ([place]) w/Device KIT Use to check blood sugar 2 times a day. 05/25/18   Philemon Kingdom, MD  glucose blood (ONETOUCH VERIO) test strip Use as instructed to check blood sugar 2 times a day. 04/02/20   Philemon Kingdom, MD  Lancets [facility] ULTRASOFT) lancets Use as instructed to check blood sugar 2 times a day. 04/02/20   Philemon Kingdom, MD  potassium chloride SA (KLOR-CON) 20 MEQ tablet Take 1 tablet (20 mEq  total) by mouth 2 (two) times daily. Patient not taking: Reported on 11/16/2020 11/05/20   Ria Bush, MD    Allergies as of 11/16/2020 - Review Complete 11/16/2020  Allergen Reaction Noted   Onglyza [saxagliptin] Hives and Nausea And Vomiting 11/26/2015   Liraglutide Nausea Only 07/07/2016    Family History  Problem Relation Age of Onset   Stroke Father        CVA   Heart disease Father        MI   Cancer Sister        breast cancer   Breast cancer Sister 40   Heart disease Brother        MI   Cancer Brother        Lung    Social History   Socioeconomic History   Marital status: Married    Spouse name: Not on file   Number of children: 2    Years of education: Not on file   Highest education level: Not on file  Occupational History    Comment: "Just up and quit" at [employer] Use   Smoking status: Former    Types: Cigarettes    Quit date: 03/15/1993    Years since quitting: 27.6   Smokeless tobacco: Never  Substance and Sexual Activity   Alcohol use: No    Alcohol/week: 0.0 standard drinks   Drug use: No   Sexual activity: Not on file  Other Topics Concern   Not on file  Social History Narrative   Has living will   Husband, then daughter [name], have health care POA   Would reluctantly accept resuscitation but no life prolonging measures   Social Determinants of Health   Financial Resource Strain: Low Risk    Difficulty of Paying Living Expenses: Not very hard  Food Insecurity: Not on file  Transportation Needs: Not on file  Physical Activity: Not on file  Stress: Not on file  Social Connections: Not on file  Intimate Partner Violence: Not on file    Review of Systems: See HPI, otherwise negative ROS  Physical Exam: BP 94/63   Pulse 89   Temp 97.7 F (36.5 C) (Oral)   Resp 16   Ht $R'5\' 6"'um$  (1.676 m)   Wt 70.3 kg   SpO2 98%   BMI 25.02 kg/m  General:   Alert,  pleasant and cooperative in NAD Head:  Normocephalic and atraumatic. Neck:  Supple; no masses or thyromegaly. Lungs:  Clear throughout to auscultation, normal respiratory effort.    Heart:  +S1, +S2, Regular rate and rhythm, No edema. Abdomen:  Soft, nontender and nondistended. Normal bowel sounds, without guarding, and without rebound.   Neurologic:  Alert and  oriented x4;  grossly normal neurologically.  Impression/Plan: Jacqueline Cole is here for an endoscopy  to be performed for  evaluation of nausea and vomiting    Risks, benefits, limitations, and alternatives regarding endoscopy have been reviewed with the patient.  Questions have been answered.  All parties agreeable.   Jonathon Bellows, MD  11/18/2020, 10:26 AM

## 2020-11-18 NOTE — Progress Notes (Signed)
Patient refused CBS and insulin. States she just wants to be left alone to get some sleep. Education provided patient has the right to refuse treatment, and monitoring blood sugar is for her safety to ensure no dangerous drops while she is asleep or high blood sugars while receiving IV steroids. Patient states she understands why and refuses treatment for now. Erling Conte, RN

## 2020-11-18 NOTE — Progress Notes (Signed)
Jonathon Bellows , MD [address] [place], [place], [place], [address] [address], [place], [place], [place], [address] Phone: [phone]  Fax: [phone]   Jacqueline Cole is being followed for acute nausea and vomiting  Day 1 of follow up   Subjective: Doing much better, no nausea,vomiting or abdominal pain. No diarrhea. She is hungry wants to eat.    Objective: Vital signs in last 24 hours: Vitals:   11/18/20 0100 11/18/20 0500 11/18/20 0600 11/18/20 0727  BP:    (!) 119/54  Pulse: 81  86 86  Resp: '14 12 13 18  '$ Temp:    97.7 F (36.5 C)  TempSrc:    Oral  SpO2: 96%  100% 100%  Weight:      Height:       Weight change:   Intake/Output Summary (Last 24 hours) at 11/18/2020 0813 Last data filed at 11/18/2020 0600 Gross per 24 hour  Intake 1917.45 ml  Output 600 ml  Net 1317.45 ml     Exam: Heart:: Regular rate and rhythm, S1S2 present, or without murmur or extra heart sounds Lungs: normal Abdomen: soft, nontender, normal bowel sounds   Lab Results: '@LABTEST2'$ @ Micro Results: Recent Results (from the past 240 hour(s))  Culture, blood (Routine x 2)     Status: None (Preliminary result)   Collection Time: 11/16/20  8:05 AM   Specimen: BLOOD  Result Value Ref Range Status   Specimen Description BLOOD LEFT [facility]  Final   Special Requests   Final    BOTTLES DRAWN AEROBIC AND ANAEROBIC Blood Culture adequate volume   Culture   Final    NO GROWTH 2 DAYS Performed at [facility], [address], [place], [place] [address]    Report Status PENDING  Incomplete  Resp Panel by RT-PCR (Flu A&B, Covid) Nasopharyngeal Swab     Status: None   Collection Time: 11/16/20  8:13 AM   Specimen: Nasopharyngeal Swab; Nasopharyngeal(NP) swabs in vial transport medium  Result Value Ref Range Status   SARS Coronavirus 2 by RT PCR NEGATIVE NEGATIVE Final    Comment: (NOTE) SARS-CoV-2 target nucleic acids are NOT DETECTED.  The SARS-CoV-2 RNA is generally detectable  in upper respiratory specimens during the acute phase of infection. The lowest concentration of SARS-CoV-2 viral copies this assay can detect is 138 copies/mL. A negative result does not preclude SARS-Cov-2 infection and should not be used as the sole basis for treatment or other patient management decisions. A negative result may occur with  improper specimen collection/handling, submission of specimen other than nasopharyngeal swab, presence of viral mutation(s) within the areas targeted by this assay, and inadequate number of viral copies(<138 copies/mL). A negative result must be combined with clinical observations, patient history, and epidemiological information. The expected result is Negative.  Fact Sheet for Patients:  EntrepreneurPulse.com.au  Fact Sheet for Healthcare Providers:  IncredibleEmployment.be  This test is no t yet approved or cleared by the [place] FDA and  has been authorized for detection and/or diagnosis of SARS-CoV-2 by FDA under an Emergency Use Authorization (EUA). This EUA will remain  in effect (meaning this test can be used) for the duration of the COVID-19 declaration under Section 564(b)(1) of the Act, 21 U.S.C.section 360bbb-3(b)(1), unless the authorization is terminated  or revoked sooner.       Influenza A by PCR NEGATIVE NEGATIVE Final   Influenza B by PCR NEGATIVE NEGATIVE Final    Comment: (NOTE) The Xpert Xpress SARS-CoV-2/FLU/RSV  plus assay is intended as an aid in the diagnosis of influenza from Nasopharyngeal swab specimens and should not be used as a sole basis for treatment. Nasal washings and aspirates are unacceptable for Xpert Xpress SARS-CoV-2/FLU/RSV testing.  Fact Sheet for Patients: EntrepreneurPulse.com.au  Fact Sheet for Healthcare Providers: IncredibleEmployment.be  This test is not yet approved or cleared by the [place] FDA and has  been authorized for detection and/or diagnosis of SARS-CoV-2 by FDA under an Emergency Use Authorization (EUA). This EUA will remain in effect (meaning this test can be used) for the duration of the COVID-19 declaration under Section 564(b)(1) of the Act, 21 U.S.C. section 360bbb-3(b)(1), unless the authorization is terminated or revoked.  Performed at [facility], [address] [place], [place] [address]   Culture, blood (Routine x 2)     Status: None (Preliminary result)   Collection Time: 11/16/20  9:10 AM   Specimen: BLOOD  Result Value Ref Range Status   Specimen Description BLOOD RIGHT HAND  Final   Special Requests   Final    BOTTLES DRAWN AEROBIC AND ANAEROBIC Blood Culture adequate volume   Culture   Final    NO GROWTH 2 DAYS Performed at [facility], [address] [place], [place] [address]    Report Status PENDING  Incomplete   Studies/Results: CT ABDOMEN PELVIS WO CONTRAST  Result Date: 11/16/2020 CLINICAL DATA:  Unresponsive, diminished mental status, incontinence of stool, nausea, vomiting and lactic acidosis. EXAM: CT ABDOMEN AND PELVIS WITHOUT CONTRAST TECHNIQUE: Multidetector CT imaging of the abdomen and pelvis was performed following the standard protocol without IV contrast. COMPARISON:  None. FINDINGS: Lower chest: No acute abnormality. Hepatobiliary: No focal liver abnormality is seen. Status post cholecystectomy. No biliary dilatation. Pancreas: Unremarkable. No pancreatic ductal dilatation or surrounding inflammatory changes. Spleen: Normal in size without focal abnormality. Adrenals/Urinary Tract: Adrenal glands are unremarkable. Kidneys are normal, without renal calculi, focal lesion, or hydronephrosis. Bladder is unremarkable. Stomach/Bowel: Bowel shows diverticulosis of the sigmoid colon without evidence of acute diverticulitis. No evidence of bowel obstruction, ileus, inflammatory process or free intraperitoneal air. No obvious focal  bowel lesions by unenhanced CT. The appendix is not discretely visualized. No evidence of free intraperitoneal air. Vascular/Lymphatic: Atherosclerosis of the abdominal aorta and iliac arteries without evidence of aneurysm. No lymphadenopathy identified. Reproductive: Status post hysterectomy. No adnexal masses. Other: No abdominal wall hernia or abnormality. No evidence of focal abscess or ascites. Musculoskeletal: No acute or significant osseous findings. IMPRESSION: No acute findings in the abdomen or pelvis. Diverticulosis of the sigmoid colon without evidence of acute diverticulitis. Electronically Signed   By: Aletta Edouard M.D.   On: 11/16/2020 11:38   CT HEAD WO CONTRAST (5MM)  Result Date: 11/16/2020 CLINICAL DATA:  Mental status change, unknown cause EXAM: CT HEAD WITHOUT CONTRAST TECHNIQUE: Contiguous axial images were obtained from the base of the skull through the vertex without intravenous contrast. COMPARISON:  None. FINDINGS: Brain: There is a 3.8 x 2.3 x 2.2 cm extra-axial soft tissue mass in anterior right frontal region (series 2/image 23) with mild local mass effect on the right frontal lobe. No evidence of parenchymal hemorrhage. No midline shift. No CT evidence of acute infarction. Generalized cerebral volume loss. Nonspecific mild to moderate subcortical and periventricular white matter hypodensity, most in keeping with chronic small vessel ischemic change. No ventriculomegaly. Vascular: No acute abnormality. Skull: No evidence of calvarial fracture. Sinuses/Orbits: The visualized paranasal sinuses are essentially clear. Other:  The mastoid air  cells are unopacified. IMPRESSION: 1. Extra-axial 3.8 x 2.3 x 2.2 cm soft tissue mass in the anterior right frontal region with mild local mass effect on the right frontal lobe, suspicious for a meningioma. MRI brain without and with IV contrast is recommended for further characterization. 2. Generalized cerebral volume loss and mild-to-moderate  chronic small vessel ischemic changes in the cerebral white matter. 3. No evidence of acute intracranial abnormality. Electronically Signed   By: Ilona Sorrel M.D.   On: 11/16/2020 09:04   MR BRAIN W WO CONTRAST  Result Date: 11/16/2020 CLINICAL DATA:  Initial evaluation for meningioma. EXAM: MRI HEAD WITHOUT AND WITH CONTRAST TECHNIQUE: Multiplanar, multiecho pulse sequences of the brain and surrounding structures were obtained without and with intravenous contrast. CONTRAST:  42m GADAVIST GADOBUTROL 1 MMOL/ML IV SOLN COMPARISON:  Prior head CT from earlier the same day. FINDINGS: Brain: Cerebral volume within normal limits for age. Patchy T2/FLAIR hyperintensity involving the periventricular and deep white matter both cerebral hemispheres as well as the pons, most consistent with chronic small vessel ischemic disease, moderate in nature. No evidence for acute or subacute infarct. Gray-white matter differentiation maintained. No encephalomalacia to suggest chronic cortical infarction. No acute intracranial hemorrhage. Single punctate chronic microhemorrhage noted within the subcortical right parietal lobe, of doubtful significance in isolation. 2.5 x 3.8 x 4.0 cm (craniocaudad by AP by transverse) enhancing extra-axial mass overlies the right frontal convexity, most consistent with a meningioma. No significant regional mass effect or vasogenic edema within the adjacent right frontal lobe. No other mass lesion or abnormal enhancement. No hydrocephalus or extra-axial fluid collection. Pituitary gland suprasellar region within normal limits. Midline structures intact and normal. Vascular: Major intracranial vascular flow voids are maintained. Skull and upper cervical spine: Craniocervical junction within normal limits. Bone marrow signal intensity normal. No scalp soft tissue abnormality. Sinuses/Orbits: Globes and orbital soft tissues within normal limits. Paranasal sinuses are clear. Trace left mastoid effusion  noted, of doubtful significance. Other: None. IMPRESSION: 1. 2.5 x 3.8 x 4.0 cm meningioma overlying the right frontal convexity. No associated edema or significant regional mass effect. 2. Underlying moderate chronic microvascular ischemic disease. Electronically Signed   By: BJeannine BogaM.D.   On: 11/16/2020 21:36   DG Chest Portable 1 View  Result Date: 11/16/2020 CLINICAL DATA:  Weakness and unresponsive. EXAM: PORTABLE CHEST 1 VIEW COMPARISON:  10/30/2020 FINDINGS: The heart size and mediastinal contours are within normal limits. There is no evidence of pulmonary edema, consolidation, pneumothorax obvious mass or pleural fluid. The visualized skeletal structures are unremarkable. IMPRESSION: No active disease. Electronically Signed   By: GAletta EdouardM.D.   On: 11/16/2020 08:57   [place]KG SITE RITE  Result Date: 11/16/2020 If Site Rite image not attached, placement could not be confirmed due to current cardiac rhythm.  Medications: I have reviewed the patient's current medications. Scheduled Meds:  dexamethasone (DECADRON) injection  4 mg Intravenous TID   enoxaparin (LOVENOX) injection  40 mg Subcutaneous Q24H   insulin aspart  0-9 Units Subcutaneous Q4H   pantoprazole (PROTONIX) IV  40 mg Intravenous Q12H   venlafaxine XR  75 mg Oral Daily   Continuous Infusions:  sodium chloride     lactated ringers 75 mL/hr at 11/18/20 0732   norepinephrine (LEVOPHED) Adult infusion Stopped (11/17/20 1642)   TPN ADULT (ION)     PRN Meds:.docusate sodium, hydrocortisone cream, ondansetron (ZOFRAN) IV, polyethylene glycol Today's Vitals   11/18/20 0100 11/18/20 0500 11/18/20 0600 11/18/20 0727  BP:    (Marland Kitchen  119/54  Pulse: 81  86 86  Resp: '14 12 13 18  '$ Temp:    97.7 F (36.5 C)  TempSrc:    Oral  SpO2: 96%  100% 100%  Weight:      Height:      PainSc:       Body mass index is 25.02 kg/m.   Assessment: Active Problems:   Hypovolemic shock (HCC)  Venda T Pizzuto 70 y.o. female  admitted with severe nausea, vomiting imaging revealed right frontal lobe hemangioma. On dexamethasone, Reglan. CT abdomen showed no acute abnormality. Differentials include gastroparesis vs acute gastroenetritis   Plan:  EGD today , if normal no further GI input.  I have discussed alternative options, risks & benefits,  which include, but are not limited to, bleeding, infection, perforation,respiratory complication & drug reaction.  The patient agrees with this plan & written consent will be obtained.      LOS: 2 days   Jonathon Bellows, MD 11/18/2020, 8:13 AM

## 2020-11-18 NOTE — Op Note (Signed)
[facility] Gastroenterology Patient Name: Jacqueline Cole Procedure Date: 11/18/2020 10:49 AM MRN: [id number] Account #: [id number] Date of Birth: 06/06/1950 Admit Type: Outpatient Age: 70 Room: [facility] ENDO ROOM 2 Gender: Female Note Status: Finalized Instrument Name: Upper Endoscope [id number] Procedure:             Upper GI endoscopy Indications:           Nausea with vomiting Providers:             Jonathon Bellows MD, MD Referring MD:          Venia Carbon (Referring MD) Medicines:             Monitored Anesthesia Care Complications:         No immediate complications. Procedure:             Pre-Anesthesia Assessment:                        - Prior to the procedure, a History and Physical was                         performed, and patient medications, allergies and                         sensitivities were reviewed. The patient's tolerance                         of previous anesthesia was reviewed.                        - The risks and benefits of the procedure and the                         sedation options and risks were discussed with the                         patient. All questions were answered and informed                         consent was obtained.                        - ASA Grade Assessment: III - A patient with severe                         systemic disease.                        - After reviewing the risks and benefits, the patient                         was deemed in satisfactory condition to undergo the                         procedure.                        - ASA Grade Assessment: II - A patient with mild                         systemic  disease.                        After obtaining informed consent, the endoscope was                         passed under direct vision. Throughout the procedure,                         the patient's blood pressure, pulse, and oxygen                         saturations were monitored continuously. The  Endoscope                         was introduced through the mouth, and advanced to the                         third part of duodenum. The upper GI endoscopy was                         accomplished with ease. The patient tolerated the                         procedure well. Findings:      The esophagus was normal.      One benign-appearing, intrinsic mild stenosis was found in the lower       third of the esophagus. This stenosis measured 1.5 cm (inner diameter).       The stenosis was traversed.      Patchy moderate inflammation characterized by congestion (edema),       erythema, friability and granularity was found on the greater curvature       of the stomach, in the gastric antrum and in the prepyloric region of       the stomach. Biopsies were taken with a cold forceps for histology.      The cardia and gastric fundus were normal on retroflexion. Impression:            - Normal esophagus.                        - Benign-appearing esophageal stenosis.                        - Gastritis. Biopsied. Recommendation:        - Await pathology results.                        - Return patient to hospital ward for ongoing care.                        - Advance diet as tolerated.                        - Continue present medications.                        - Return to my office PRN. Procedure Code(s):     --- Professional ---  [id number], Esophagogastroduodenoscopy, flexible,                         transoral; with biopsy, single or multiple Diagnosis Code(s):     --- Professional ---                        K22.2, Esophageal obstruction                        K29.70, Gastritis, unspecified, without bleeding                        R11.2, Nausea with vomiting, unspecified CPT copyright 2019 American Medical Association. All rights reserved. The codes documented in this report are preliminary and upon coder review may  be revised to meet current compliance  requirements. Jonathon Bellows, MD Jonathon Bellows MD, MD 11/18/2020 11:00:38 AM This report has been signed electronically. Number of Addenda: 0 Note Initiated On: 11/18/2020 10:49 AM Estimated Blood Loss:  Estimated blood loss: none.      [facility]

## 2020-11-18 NOTE — OR Nursing (Signed)
Endoscopy procedure completed. Dr. Vicente Males in to speak with patient. Awaiting inpatient bed assignment. Expresses the desire to go home. Explained we are awaiting a bed assignment and need to stay for continued inpatient care and monitoring. Bed placement notified with bed placement expediting bed assignment. Meal tray ordered and received. Spoke with patient and spouse notifying them of the work being done on bed placement and risks of leaving the hospital against medical recommendation. Bed assigned. Patient and spouse were notified. Still insistent on going home with both stating an understanding of risks of leaving. AMA papers signed. Drs Vicente Males and Lanney Gins aware.

## 2020-11-18 NOTE — ED Notes (Signed)
Pt signed consent for EGD

## 2020-11-18 NOTE — Anesthesia Preprocedure Evaluation (Signed)
Anesthesia Evaluation  Patient identified by MRN, date of birth, ID band Patient awake    Reviewed: Allergy & Precautions, NPO status , Patient's Chart, lab work & pertinent test results  History of Anesthesia Complications (+) DIFFICULT IV STICK / SPECIAL LINE and history of anesthetic complications  Airway Mallampati: III  TM Distance: <3 FB Neck ROM: full    Dental  (+) Missing   Pulmonary neg pulmonary ROS, neg shortness of breath, former smoker,    Pulmonary exam normal        Cardiovascular Exercise Tolerance: Good (-) Past MI Normal cardiovascular exam     Neuro/Psych PSYCHIATRIC DISORDERS  Neuromuscular disease    GI/Hepatic negative GI ROS, Neg liver ROS,   Endo/Other  diabetes, Type 2  Renal/GU Renal disease  negative genitourinary   Musculoskeletal   Abdominal   Peds  Hematology negative hematology ROS (+)   Anesthesia Other Findings Patient is NPO appropriate and reports no nausea or vomiting today.  Past Medical History: No date: Depression No date: Diabetes mellitus type II No date: Hyperlipemia 09/01/2006: Major depressive disorder, recurrent, in remission ([place])     Comment:  Qualifier: Diagnosis of  By: Lelon Mast   No date: Obesity No date: Osteopenia 1998: Syncope     Comment:  DM diagnosis  Past Surgical History: ~2005: ABDOMINAL HYSTERECTOMY     Comment:  and BSO 01/05/2019: BREAST BIOPSY; Right     Comment:  rt stereo bx 2 areas 1st area ribbon clip 01/05/2019: BREAST BIOPSY; Right     Comment:  rt stereo bx 2nd area coil clip 1975: CHOLECYSTECTOMY No date: COSMETIC SURGERY     Comment:  Injury Right face as a child No date: VAGINAL DELIVERY     Comment:  X 2  BMI    Body Mass Index: 25.01 kg/m      Reproductive/Obstetrics negative OB ROS                             Anesthesia Physical Anesthesia Plan  ASA: 3  Anesthesia Plan: General    Post-op Pain Management:    Induction: Intravenous  PONV Risk Score and Plan: Propofol infusion and TIVA  Airway Management Planned: Natural Airway and Nasal Cannula  Additional Equipment:   Intra-op Plan:   Post-operative Plan:   Informed Consent: I have reviewed the patients History and Physical, chart, labs and discussed the procedure including the risks, benefits and alternatives for the proposed anesthesia with the patient or authorized representative who has indicated his/her understanding and acceptance.     Dental Advisory Given  Plan Discussed with: Anesthesiologist, CRNA and Surgeon  Anesthesia Plan Comments: (Patient and husband consented for risks of anesthesia including but not limited to:  - adverse reactions to medications - risk of airway placement if required - damage to eyes, teeth, lips or other oral mucosa - nerve damage due to positioning  - sore throat or hoarseness - Damage to heart, brain, nerves, lungs, other parts of body or loss of life  They voiced understanding.)        Anesthesia Quick Evaluation

## 2020-11-18 NOTE — Progress Notes (Signed)
Secure chat sent to RN and MD re: PICC order 9/5. RN will update team if PICC is still needed after patient's EGD. Will follow up.

## 2020-11-18 NOTE — ED Notes (Signed)
Endo at bedside to take pt for procedure

## 2020-11-18 NOTE — Transfer of Care (Signed)
Immediate Anesthesia Transfer of Care Note  Patient: Jacqueline Cole  Procedure(s) Performed: ESOPHAGOGASTRODUODENOSCOPY (EGD) WITH PROPOFOL  Patient Location: Endoscopy Unit  Anesthesia Type:General  Level of Consciousness: awake, alert  and oriented  Airway & Oxygen Therapy: Patient Spontanous Breathing  Post-op Assessment: Report given to RN and Post -op Vital signs reviewed and stable  Post vital signs: Reviewed  Last Vitals:  Vitals Value Taken Time  BP    Temp    Pulse    Resp    SpO2      Last Pain:  Vitals:   11/18/20 1033  TempSrc: Temporal  PainSc: 0-No pain         Complications: No notable events documented.

## 2020-11-18 NOTE — ED Notes (Signed)
MD made aware that nuc med reports gastric emptying procedure is only done outpt and on Fridays.

## 2020-11-18 NOTE — Progress Notes (Signed)
NAME:  Jacqueline Cole, MRN:  [id number], DOB:  11-20-1950, LOS: 2 ADMISSION DATE:  11/16/2020, CONSULTATION DATE: 11/16/2020 REFERRING MD: Dr. Larinda Buttery, CHIEF COMPLAINT: Nausea/Vomiting    History of Present Illness:  This is a 70 yo female with a PMH of Type II Diabetes Mellitus who presented to [facility] ER on 09/4 via EMS with unresponsiveness, last known well time 06:30 am on 09/4.  Per EMS run sheet upon arrival at pts home she was found slumped down in her wheelchair unresponsive, but they were able to palpate a weak carotid pulse.  EMS EKG revealed sinus tachycardia, but no ST-elevation.  EMS unable to obtain an automatic or manual bp initially, therefore iv fluid bolus administered.  En route to the ER pt became alert and responsive with garbled speech.    ED course Upon arrival to the ER pt minimally responsive, but able to open her eyes and follow commands with no other neurological deficits.  CT Head negative for acute intracranial abnormality, however concerning for meningioma recommendation MRI Brain with/ without iv contrast for further characterization.  Pt has had continued nausea/vomiting for over a month.  She has been unable to take her po medications including her metformin.  She was seen by her PCP on 09/1 due to poor po intake and severe hypokalemia.  In the ER labs revealed K+ 2.4, chloride 90, glucose 352, creatinine 1.26, anion gap 22, albumin 3.2, AST 43, wbc 13.0, troponin 39, lactic acid 9.5, and pct <0.10.  Pt continued to have severe  nausea/vomiting and became hypotensive.  She received 2L LR bolus, but remained hypotensive requiring peripheral levophed gtt.  PCCM contacted for ICU admission for additional workup and treatment.    11/17/20- met with husband at bedside to review findings and medical plan. Patient examined at bedside with improved sensorium.  Remains on low dose vasopressor support. Loose stools have slowed down, vomiting improved patient asking for meal and remorseful  for aggitated behavior overnight.    11/18/20- patient significantly improved with no requirement of any infusion therapy off pressors and fully lucid.  She asked to go home several times and threatened to leave against medical advice.  I met with husband and patient and encouraged her to please continue set medical plan.   Pertinent  Medical History  Depression  Type II Diabetes Mellitus  HLD Obesity  Osteopenia  Syncope   Significant Hospital Events: Including procedures, antibiotic start and stop dates in addition to other pertinent events   09/4: Pt admitted to ICU with hypovolemic shock due to severe  nausea/vomiting requiring peripheral levophed gtt 09/4: CT Head: revealed Extra-axial 3.8 x 2.3 x 2.2 cm soft tissue mass in the anterior right frontal region with mild local mass effect on the right frontal lobe, suspicious for a meningioma. MRI brain without and with IV contrast is recommended for further characterization. Generalized cerebral volume loss and mild-to-moderate chronic small vessel ischemic changes in the cerebral white matter. No evidence of acute intracranial abnormality. 09/4: CT Abd Pelvis: No acute findings in the abdomen or pelvis. Diverticulosis of the sigmoid colon without evidence of acute diverticulitis.  09/4 MR Brain:    Interim History / Subjective:  Pt resting in bed no complaints asking can she drink water.  States her nausea is a lot better.  She has not had a BM in 3 weeks, but had a BM today.   Objective   Blood pressure (!) 119/54, pulse 86, temperature 97.7 F (36.5 C), temperature source  Oral, resp. rate 18, height $RemoveBe'5\' 6"'RqwPwGRSM$  (1.676 m), weight 70.3 kg, SpO2 100 %.        Intake/Output Summary (Last 24 hours) at 11/18/2020 0759 Last data filed at 11/18/2020 0600 Gross per 24 hour  Intake 1917.45 ml  Output 600 ml  Net 1317.45 ml    Filed Weights   11/16/20 0807  Weight: 70.3 kg    Examination: General: acutely ill appearing female, resting in bed  NAD  HENT: supple, no JVD  Lungs: clear throughout, even, non labored  Cardiovascular: nsr, rrr, no R/G, 2+ radial/1+ distal pulses, no edema  Abdomen: +BS x4, obese, soft, non tender, non distended  Extremities: normal bulk and tone, moves all extremities  Neuro: alert and oriented, follows commands  GU: voiding urine   Resolved Hospital Problem list     Assessment & Plan:   Hypovolemic shock secondary to vomiting possible due to infectius etiology such as viral gastroenteritis           -stool PCR           - legionella            -CT abd reviewed - no acute pathology , +old diverticulosis without diverticulitis - Continuous telemetry monitoring  - Aggressive fluid resuscitation and prn levophed gtt to maintain map >65 - TSH, Free T4, and cortisol level pending   Acute kidney injury secondary to ATN  Hypokalemia and hypomagnesia  Lactic acidosis: 9.5~5.1 - Trend BMP  - Replace electrolytes as indicated  - Monitor UOP - Avoid nephrotoxic medications  - LR with 20 meq KCL $Remov'@125'KgYTHh$  ml/hr   Nausea/Vomiting suspected secondary to gastroparesis although CT Head concerning for possible meningioma  Gastroparesis  - Sips of water only  - Prn zofran for nausea/vomiting - Scheduled protonix  - GI consulted appreciate input  - Gastric emptying study pending   Type II Diabetes Mellitus  - Hemoglobin A1c pending - CBG's q4hrs - SSI    Acute encephalopathy likely secondary to hypotension  Suspected meningioma per CT Head 09/4 - MR Brain with/without contrast  - Frequent reorientation  - Avoid sedating medications when able   Best Practice (right click and "Reselect all SmartList Selections" daily)   Diet/type: Sips of water only  DVT prophylaxis: LMWH GI prophylaxis: PPI Lines: PICC line placement pending  Foley:  N/A Code Status:  full code Last date of multidisciplinary goals of care discussion [N/A]  Updated pt and pts husband regarding plan of care and all questions  were answered 11/16/20 Labs   CBC: Recent Labs  Lab 11/16/20 0810 11/16/20 1148 11/17/20 0604 11/18/20 0502  WBC 13.0* 20.5* 9.0 6.1  NEUTROABS 7.4  --   --  5.3  HGB 13.6 12.5 10.0* 10.4*  HCT 39.7 36.0 28.3* 29.2*  MCV 88.8 87.6 87.6 87.4  PLT 243 218 173 174     Basic Metabolic Panel: Recent Labs  Lab 11/13/20 1510 11/16/20 0805 11/16/20 0810 11/16/20 1148 11/16/20 1529 11/17/20 0604 11/18/20 0502  NA 136  --  138  --   --  135 137  K 2.5*  --  2.4*  --  3.1* 3.2* 4.4  CL 85*  --  90*  --   --  97* 99  CO2 34*  --  26  --   --  31 31  GLUCOSE 213*  --  352*  --   --  93 151*  BUN 16  --  17  --   --  14 13  CREATININE 0.93  --  1.26* 1.14*  --  0.95 0.76  CALCIUM 9.8  --  9.3  --   --  8.4* 8.5*  MG  --  1.8  --   --  1.8 1.8 2.0  PHOS 3.4  --   --  3.4  --  1.2* 3.6    GFR: Estimated Creatinine Clearance: 62.1 mL/min (by C-G formula based on SCr of 0.76 mg/dL). Recent Labs  Lab 11/16/20 0805 11/16/20 0810 11/16/20 1021 11/16/20 1148 11/16/20 1538 11/17/20 0604 11/18/20 0502  PROCALCITON <0.10  --   --   --   --  7.45 3.20  WBC  --  13.0*  --  20.5*  --  9.0 6.1  LATICACIDVEN 9.5*  --  5.1*  --  1.7  --   --      Liver Function Tests: Recent Labs  Lab 11/13/20 1510 11/16/20 0810 11/17/20 0604 11/18/20 0502  AST  --  43* 32 23  ALT  --  $R'12 13 13  'vq$ ALKPHOS  --  66 48 51  BILITOT  --  1.8* 1.4* 1.4*  PROT  --  5.5* 4.2* 4.6*  ALBUMIN 3.7 3.2* 2.5* 2.8*    Recent Labs  Lab 11/17/20 1119  LIPASE 23   No results for input(s): AMMONIA in the last 168 hours.  ABG    Component Value Date/Time   HCO3 30.4 (H) 11/16/2020 0946   O2SAT 57.6 11/16/2020 0946      Coagulation Profile: Recent Labs  Lab 11/16/20 0810  INR 1.0     Cardiac Enzymes: No results for input(s): CKTOTAL, CKMB, CKMBINDEX, TROPONINI in the last 168 hours.  HbA1C: Hemoglobin A1C  Date/Time Value Ref Range Status  05/30/2020 09:01 AM 5.9 (A) 4.0 - 5.6 % Final   02/04/2020 10:08 AM 6.0 (A) 4.0 - 5.6 % Corrected   Hgb A1c MFr Bld  Date/Time Value Ref Range Status  09/09/2020 10:06 AM 5.5 4.6 - 6.5 % Final    Comment:    Glycemic Control Guidelines for People with Diabetes:Non Diabetic:  <6%Goal of Therapy: <7%Additional Action Suggested:  >8%   10/13/2016 12:13 PM 8.2 (H) 4.6 - 6.5 % Final    Comment:    Glycemic Control Guidelines for People with Diabetes:Non Diabetic:  <6%Goal of Therapy: <7%Additional Action Suggested:  >8%     CBG: Recent Labs  Lab 11/17/20 1207 11/17/20 1621 11/17/20 2149 11/17/20 2310 11/18/20 0729  GLUCAP 116* 185* 150* 138* 177*     Review of Systems: Positives in BOLD   Gen: Denies fever, chills, weight change, fatigue, night sweats HEENT: Denies blurred vision, double vision, hearing loss, tinnitus, sinus congestion, rhinorrhea, sore throat, neck stiffness, dysphagia PULM: Denies shortness of breath, cough, sputum production, hemoptysis, wheezing CV: Denies chest pain, edema, orthopnea, paroxysmal nocturnal dyspnea, palpitations GI: abdominal pain, nausea, vomiting, diarrhea, hematochezia, melena, constipation, change in bowel habits GU: Denies dysuria, hematuria, polyuria, oliguria, urethral discharge Endocrine: Denies hot or cold intolerance, polyuria, polyphagia or appetite change Derm: Denies rash, dry skin, scaling or peeling skin change Heme: Denies easy bruising, bleeding, bleeding gums Neuro: headache, numbness, weakness, slurred speech, loss of memory or consciousness  Past Medical History:  She,  has a past medical history of Depression, Diabetes mellitus type II, Hyperlipemia, Major depressive disorder, recurrent, in remission ([place]) (09/01/2006), Obesity, Osteopenia, and Syncope (1998).   Surgical History:   Past Surgical History:  Procedure Laterality Date   ABDOMINAL HYSTERECTOMY  ~2005  and BSO   BREAST BIOPSY Right 01/05/2019   rt stereo bx 2 areas 1st area ribbon clip   BREAST BIOPSY  Right 01/05/2019   rt stereo bx 2nd area coil clip   CHOLECYSTECTOMY  1975   COSMETIC SURGERY     Injury Right face as a child   VAGINAL DELIVERY     X 2     Social History:   reports that she quit smoking about 27 years ago. Her smoking use included cigarettes. She has never used smokeless tobacco. She reports that she does not drink alcohol and does not use drugs.   Family History:  Her family history includes Breast cancer (age of onset: 12) in her sister; Cancer in her brother and sister; Heart disease in her brother and father; Stroke in her father.   Allergies Allergies  Allergen Reactions   Onglyza [Saxagliptin] Hives and Nausea And Vomiting   Liraglutide Nausea Only    Can't eat, nausea, higher sugars     Home Medications  Prior to Admission medications   Medication Sig Start Date End Date Taking? Authorizing Provider  Blood Glucose Monitoring Suppl ([place]) w/Device KIT Use to check blood sugar 2 times a day. 05/25/18   Philemon Kingdom, MD  glucose blood (ONETOUCH VERIO) test strip Use as instructed to check blood sugar 2 times a day. 04/02/20   Philemon Kingdom, MD  ibuprofen (ADVIL,MOTRIN) 200 MG tablet Take 200 mg by mouth every 6 (six) hours as needed.    [provider]  Lancets [facility] ULTRASOFT) lancets Use as instructed to check blood sugar 2 times a day. 04/02/20   Philemon Kingdom, MD  ondansetron (ZOFRAN-ODT) 4 MG disintegrating tablet Take 1 tablet (4 mg total) by mouth every 8 (eight) hours as needed for nausea or vomiting. 11/05/20   Venia Carbon, MD  potassium chloride SA (KLOR-CON) 20 MEQ tablet Take 1 tablet (20 mEq total) by mouth 2 (two) times daily. 11/05/20   Ria Bush, MD  venlafaxine XR (EFFEXOR-XR) 150 MG 24 hr capsule TAKE ONE CAPSULE BY MOUTH EVERY MORNING and TAKE ONE TABLET BY MOUTH EVERYDAY AT BEDTIME 10/27/20   Venia Carbon, MD     Critical care provider statement:   Total critical care time: 33  minutes   Performed by: Lanney Gins MD   Critical care time was exclusive of separately billable procedures and treating other patients.   Critical care was necessary to treat or prevent imminent or life-threatening deterioration.   Critical care was time spent personally by me on the following activities: development of treatment plan with patient and/or surrogate as well as nursing, discussions with consultants, evaluation of patient's response to treatment, examination of patient, obtaining history from patient or surrogate, ordering and performing treatments and interventions, ordering and review of laboratory studies, ordering and review of radiographic studies, pulse oximetry and re-evaluation of patient's condition.    Ottie Glazier, M.D.  Pulmonary & Critical Care Medicine

## 2020-11-18 NOTE — Consult Note (Signed)
Patient left AGAINST MEDICAL ADVICE per primary team's report to me prior to me being able to complete a full evaluation

## 2020-11-18 NOTE — Discharge Summary (Addendum)
  [place] (AMA)

## 2020-11-19 ENCOUNTER — Encounter: Payer: Self-pay | Admitting: Gastroenterology

## 2020-11-19 LAB — LEGIONELLA PNEUMOPHILA TOTAL AB: Legionella Pneumo Total Ab: <0.91 OD ratio (ref 0.00–0.90)

## 2020-11-19 NOTE — Anesthesia Postprocedure Evaluation (Signed)
Anesthesia Post Note  Patient: Onnika T Haring  Procedure(s) Performed: ESOPHAGOGASTRODUODENOSCOPY (EGD) WITH PROPOFOL  Patient location during evaluation: Endoscopy Anesthesia Type: General Level of consciousness: awake and alert Pain management: pain level controlled Vital Signs Assessment: post-procedure vital signs reviewed and stable Respiratory status: spontaneous breathing, nonlabored ventilation, respiratory function stable and patient connected to nasal cannula oxygen Cardiovascular status: blood pressure returned to baseline and stable Postop Assessment: no apparent nausea or vomiting Anesthetic complications: no   No notable events documented.   Last Vitals:  Vitals:   11/18/20 1151 11/18/20 1201  BP: (!) 181/163 (!) 170/90  Pulse: (!) 103 (!) 196  Resp: 20 20  Temp:    SpO2: 100% 100%    Last Pain:  Vitals:   11/18/20 1101  TempSrc: Temporal  PainSc: 0-No pain                 Precious Haws [place]

## 2020-11-20 ENCOUNTER — Ambulatory Visit (INDEPENDENT_AMBULATORY_CARE_PROVIDER_SITE_OTHER): Payer: PPO | Admitting: Internal Medicine

## 2020-11-20 ENCOUNTER — Other Ambulatory Visit: Payer: Self-pay

## 2020-11-20 ENCOUNTER — Encounter: Payer: Self-pay | Admitting: Internal Medicine

## 2020-11-20 DIAGNOSIS — E1142 Type 2 diabetes mellitus with diabetic polyneuropathy: Secondary | ICD-10-CM

## 2020-11-20 DIAGNOSIS — F331 Major depressive disorder, recurrent, moderate: Secondary | ICD-10-CM

## 2020-11-20 DIAGNOSIS — R112 Nausea with vomiting, unspecified: Secondary | ICD-10-CM | POA: Diagnosis not present

## 2020-11-20 DIAGNOSIS — R571 Hypovolemic shock: Secondary | ICD-10-CM

## 2020-11-20 LAB — SURGICAL PATHOLOGY

## 2020-11-20 NOTE — Assessment & Plan Note (Signed)
Sugars better now without Rx Will stay off meds for now---monitor fasting sugars Will probably try low dose lantus or glipizide if sugars go up

## 2020-11-20 NOTE — Assessment & Plan Note (Signed)
Doing much better since fluid resuscitation Feels better now Able to eat and drink okay

## 2020-11-20 NOTE — Assessment & Plan Note (Signed)
She is off fluoxetine---now just on venlafaxine

## 2020-11-20 NOTE — Progress Notes (Signed)
Subjective:    Patient ID: Jacqueline Cole, female    DOB: 1950-04-28, 70 y.o.   MRN: [id number]  HPI Here for hospital follow up This visit occurred during the SARS-CoV-2 public health emergency.  Safety protocols were in place, including screening questions prior to the visit, additional usage of staff PPE, and extensive cleaning of exam room while observing appropriate contact time as indicated for disinfecting solutions.   Passed out at home---EMS came Only woke up in ER Hypovolemic shock---got fluids/pressors/potassium/magnesium Everything "woke up"--bowels, mentation, etc  Stayed over 2 nights in ER EGD done---gastritis found  Weight now back up 13# Eating again--but small quantities Sugar was low --44--- this morning (after coke last night) Generally fasting 120-150 without meds  Bowels are moving --back to normal The urgency has resolved  Current Outpatient Medications on File Prior to Visit  Medication Sig Dispense Refill   venlafaxine XR (EFFEXOR-XR) 150 MG 24 hr capsule TAKE ONE CAPSULE BY MOUTH EVERY MORNING and TAKE ONE TABLET BY MOUTH EVERYDAY AT BEDTIME (Patient taking differently: Take 150 mg by mouth in the morning and at bedtime.) 180 capsule 0   Blood Glucose Monitoring Suppl ([place]) w/Device KIT Use to check blood sugar 2 times a day. (Patient not taking: Reported on 11/20/2020) 1 kit 0   gabapentin (NEURONTIN) 300 MG capsule Take 300 mg by mouth 2 (two) times daily. (Patient not taking: Reported on 11/20/2020)     glucose blood (ONETOUCH VERIO) test strip Use as instructed to check blood sugar 2 times a day. (Patient not taking: Reported on 11/20/2020) 200 each 12   hydrocortisone cream 1 % Apply 1 application topically daily as needed for itching. (Patient not taking: Reported on 11/20/2020)     ibuprofen (ADVIL,MOTRIN) 200 MG tablet Take 200-800 mg by mouth every 6 (six) hours as needed. (Patient not taking: Reported on 11/20/2020)     Lancets  (ONETOUCH ULTRASOFT) lancets Use as instructed to check blood sugar 2 times a day. (Patient not taking: Reported on 11/20/2020) 200 each 12   metFORMIN (GLUCOPHAGE) 1000 MG tablet Take 1,000 mg by mouth 2 (two) times daily with a meal. (Patient not taking: Reported on 11/20/2020)     ondansetron (ZOFRAN-ODT) 4 MG disintegrating tablet Take 1 tablet (4 mg total) by mouth every 8 (eight) hours as needed for nausea or vomiting. (Patient not taking: Reported on 11/20/2020) 30 tablet 0   potassium chloride SA (KLOR-CON) 20 MEQ tablet Take 1 tablet (20 mEq total) by mouth 2 (two) times daily. (Patient not taking: Reported on 11/20/2020) 10 tablet 0   Propylene Glycol (SYSTANE COMPLETE OP) Place 3 drops into both eyes 3 (three) times daily. (Patient not taking: Reported on 11/20/2020)     rosuvastatin (CRESTOR) 10 MG tablet Take 10 mg by mouth daily. (Patient not taking: Reported on 11/20/2020)     trolamine salicylate (ASPERCREME) 10 % cream Apply 1 application topically as needed for muscle pain. Apply to feet (Patient not taking: Reported on 11/20/2020)     vitamin B-12 (CYANOCOBALAMIN) 500 MCG tablet Take 500 mcg by mouth daily. (Patient not taking: Reported on 11/20/2020)     No current facility-administered medications on file prior to visit.    Allergies  Allergen Reactions   Onglyza [Saxagliptin] Hives and Nausea And Vomiting   Liraglutide Nausea Only    Can't eat, nausea, higher sugars    Past Medical History:  Diagnosis Date   Depression    Diabetes mellitus type II  Hyperlipemia    Major depressive disorder, recurrent, in remission ([place]) 09/01/2006   Qualifier: Diagnosis of  By: Lelon Mast     Obesity    Osteopenia    Syncope 1998   DM diagnosis    Past Surgical History:  Procedure Laterality Date   ABDOMINAL HYSTERECTOMY  ~2005   and BSO   BREAST BIOPSY Right 01/05/2019   rt stereo bx 2 areas 1st area ribbon clip   BREAST BIOPSY Right 01/05/2019   rt stereo bx 2nd area coil clip    CHOLECYSTECTOMY  1975   COSMETIC SURGERY     Injury Right face as a child   ESOPHAGOGASTRODUODENOSCOPY (EGD) WITH PROPOFOL N/A 11/18/2020   Procedure: ESOPHAGOGASTRODUODENOSCOPY (EGD) WITH PROPOFOL;  Surgeon: Jonathon Bellows, MD;  Location[facility] ENDOSCOPY;  Service: Gastroenterology;  Laterality: N/A;   VAGINAL DELIVERY     X 2    Family History  Problem Relation Age of Onset   Stroke Father        CVA   Heart disease Father        MI   Cancer Sister        breast cancer   Breast cancer Sister 24   Heart disease Brother        MI   Cancer Brother        Lung    Social History   Socioeconomic History   Marital status: Married    Spouse name: Not on file   Number of children: 2   Years of education: Not on file   Highest education level: Not on file  Occupational History    Comment: "Just up and quit" at [employer] Use   Smoking status: Former    Types: Cigarettes    Quit date: 03/15/1993    Years since quitting: 27.7   Smokeless tobacco: Never  Vaping Use   Vaping Use: Never used  Substance and Sexual Activity   Alcohol use: No    Alcohol/week: 0.0 standard drinks   Drug use: No   Sexual activity: Not on file  Other Topics Concern   Not on file  Social History Narrative   Has living will   Husband, then daughter [name], have health care POA   Would reluctantly accept resuscitation but no life prolonging measures   Social Determinants of Health   Financial Resource Strain: Low Risk    Difficulty of Paying Living Expenses: Not very hard  Food Insecurity: Not on file  Transportation Needs: Not on file  Physical Activity: Not on file  Stress: Not on file  Social Connections: Not on file  Intimate Partner Violence: Not on file    Review of Systems Slept all night  No recent problems with neuropathy---feet and hands just feel cold Still weak--but can walk short distances in the house    Objective:   Physical Exam Constitutional:      Appearance: Normal  appearance.     Comments: Looks much better  Cardiovascular:     Rate and Rhythm: Normal rate and regular rhythm.     Heart sounds: No murmur heard.   No gallop.     Comments: Rate in 90's Pulmonary:     Effort: Pulmonary effort is normal.     Breath sounds: Normal breath sounds. No wheezing or rales.  Abdominal:     Palpations: Abdomen is soft.     Tenderness: There is no abdominal tenderness.  Musculoskeletal:     Cervical back: Neck supple.  Right lower leg: No edema.     Left lower leg: No edema.  Lymphadenopathy:     Cervical: No cervical adenopathy.  Neurological:     Mental Status: She is alert.  Psychiatric:        Mood and Affect: Mood normal.        Behavior: Behavior normal.           Assessment & Plan:

## 2020-11-20 NOTE — Assessment & Plan Note (Signed)
Likely related to liraglutide and metformin Finally better after the fluids

## 2020-11-21 ENCOUNTER — Telehealth: Payer: Self-pay

## 2020-11-21 DIAGNOSIS — Z8619 Personal history of other infectious and parasitic diseases: Secondary | ICD-10-CM

## 2020-11-21 LAB — CULTURE, BLOOD (ROUTINE X 2)
Culture: NO GROWTH
Culture: NO GROWTH
Special Requests: ADEQUATE
Special Requests: ADEQUATE

## 2020-11-21 LAB — VITAMIN B1: Vitamin B1 (Thiamine): 54.6 nmol/L — ABNORMAL LOW (ref 66.5–200.0)

## 2020-11-21 MED ORDER — OMEPRAZOLE 20 MG PO CPDR: 20.0000 mg | DELAYED_RELEASE_CAPSULE | Freq: Two times a day (BID) | ORAL | 3 refills | Status: DC

## 2020-11-21 MED ORDER — AMOXICILLIN 500 MG PO CAPS: 500.0000 mg | ORAL_CAPSULE | Freq: Three times a day (TID) | ORAL | 0 refills | Status: AC

## 2020-11-21 MED ORDER — CLARITHROMYCIN 500 MG PO TABS: 500.0000 mg | ORAL_TABLET | Freq: Two times a day (BID) | ORAL | 0 refills | Status: AC

## 2020-11-21 NOTE — Telephone Encounter (Signed)
-----   Message from Jonathon Bellows, MD sent at 11/21/2020  8:20 AM EDT ----- Thank you Dr Silvio Pate for the update.  Salomon Ganser- inform patient biopsies show H pylori gastritis:   Suggest clarithromycin 500 mg PO BID, amoxicillin 1 gram TID, omeprazole 20 mg BID all for 14 days.  , check for if she has an allergy to penicillin and will need repeat H pylori stool antigen to check for eradication after .     Kiran  ----- Message ----- From: Venia Carbon, MD Sent: 11/20/2020   4:43 PM EDT To: Jonathon Bellows, MD  I am leaving treatment up to you. She was in my office today doing much better and with resolution of her GI symptoms

## 2020-11-21 NOTE — Telephone Encounter (Signed)
Called patient but had to leave her a detailed message letting her know the below information. Antibiotics were sent to her pharmacy and she was also informed to have her blood rechecked 6 weeks after she is done with her antibiotics.

## 2020-11-25 ENCOUNTER — Telehealth: Payer: Self-pay

## 2020-11-25 NOTE — Progress Notes (Addendum)
Chronic Care Management Pharmacy Assistant   Name: Jacqueline Cole  MRN: [id number] DOB: [birth year]  Reason for Encounter: Medication Adherence and Delivery Coordination   Recent office visits:  11/24/2020 - Tillman Abide, MD - Telephone - Patient calls in with swelling in her feet no bowel movement yet some blood in stool taking antibiotics. Patient wanted to know if there was reason for alarm. Per Dr. Alphonsus Sias - Due to the gastritis (infection in stomach) found on the endoscopy by Dr Anna---antibiotics and the acid blocker are indicated. Recommended patient tries Miralax or Senna-S for bowels.  11/20/2020 - Tillman Abide, MD - Patient presented for hypovolemic shock. Patient is to stay off of the following medications for now per Dr. Alphonsus Sias: diphenhydrAMINE (BENADRYL) 25 MG tablet, diphenhydramine-acetaminophen (TYLENOL PM) 25-500 MG TABS tablet, gabapentin (NEURONTIN) 300 MG capsule, ibuprofen (ADVIL,MOTRIN) 200 MG tablet, metFORMIN (GLUCOPHAGE) 1000 MG tablet, potassium chloride SA (KLOR-CON) 20 MEQ tablet, rosuvastatin (CRESTOR) 10 MG tablet. Will stay off meds for now---monitor fasting sugars. Will probably try low dose lantus or glipizide if sugars go up.Follow up 1 month. 11/14/2020 - Critical value reported: Changed: Double the dose of Potassium per Dr. Alphonsus Sias.  11/13/2020 - Tillman Abide, MD - Patient presented for vomiting with nausea. Labs: Renal function panel. Referral to [facility] Coordination. Stopped: gabapentin (NEURONTIN) 300 MG capsule, LANTUS 100 UNIT/ML injection, metFORMIN (GLUCOPHAGE) 1000 MG tablet, naproxen sodium (ALEVE) 220 MG tablet, naproxen sodium (ALEVE) 220 MG tablet, Semaglutide, 1 MG/DOSE, (OZEMPIC, 1 MG/DOSE,) 4 MG/3ML SOPN. Stopped all listed for now, May need to restart soon---would probably start with the insulin. 11/05/2020 - Critical value reported: Potassium 2.5 Started: K-dur to take 1 tab ,BID x 5 days. 11/05/2020 - Tillman Abide, MD - Patient  presented for vomiting with nausea. Labs: Renal function panel, CBC, Hepatic function panel. Started: ondansetron (ZOFRAN-ODT) 4 MG disintegrating tablet. Recommended IV treatment due to dehydration, but refuses to go to ER. Advised Pedialyte every 5-10 min.   Recent consult visits: 11/21/2020 - [facility] - Telephone - Started: Amoxicillin (AMOXIL) 500 MG capsule, Clarithromycin (BIAXIN) 500 MG tablet and Omeprazole (PRILOSEC) 20 MG capsule. All medication is to be started on 11/21/20 and finished on 12/05/20. 11/18/2020 -  Wyline Mood, MD - Patient presented for ESOPHAGOGASTRODUODENOSCOPY (EGD) WITH PROPOFOL. Patient was advised to stay for continued inpatient care and monitoring, but patient left the hospital against medical recommendation 11/03/2020 - Phil Dopp, [facility] - Patient unable to complete appt today due to not feeling well. Rescheduled for November 2022.  Hospital visits:  Medication Reconciliation was completed by comparing discharge summary, patient's EMR and Pharmacy list, and upon discussion with patient. 11/16/2020 due to Hypovolemic shock. Discharged from [facility] same day.  Patient left against medical advice. 10/30/2020 - [facility] - Patient presented for nausea and vomiting 1x this week. Labs: Comprehensive Metabolic Panel, CBC and DG Chest 2 views. Patient was advised to stay, but did not; patient left the hospital.   Medications: Outpatient Encounter Medications as of 11/25/2020  Medication Sig   amoxicillin (AMOXIL) 500 MG capsule Take 1 capsule (500 mg total) by mouth 3 (three) times daily for 14 days.   Blood Glucose Monitoring Suppl (ONETOUCH VERIO FLEX SYSTEM) w/Device KIT Use to check blood sugar 2 times a day. (Patient not taking: Reported on 11/20/2020)   clarithromycin (BIAXIN) 500 MG tablet Take 1 tablet (500 mg total) by mouth 2 (two) times daily for 14 days.   glucose blood (ONETOUCH  VERIO) test strip Use as instructed to check  blood sugar 2 times a day. (Patient not taking: Reported on 11/20/2020)   hydrocortisone cream 1 % Apply 1 application topically daily as needed for itching. (Patient not taking: Reported on 11/20/2020)   Lancets (ONETOUCH ULTRASOFT) lancets Use as instructed to check blood sugar 2 times a day. (Patient not taking: Reported on 11/20/2020)   omeprazole (PRILOSEC) 20 MG capsule Take 1 capsule (20 mg total) by mouth 2 (two) times daily before a meal for 14 days.   ondansetron (ZOFRAN-ODT) 4 MG disintegrating tablet Take 1 tablet (4 mg total) by mouth every 8 (eight) hours as needed for nausea or vomiting. (Patient not taking: Reported on 11/20/2020)   Propylene Glycol (SYSTANE COMPLETE OP) Place 3 drops into both eyes 3 (three) times daily. (Patient not taking: Reported on 05/21/7562)   trolamine salicylate (ASPERCREME) 10 % cream Apply 1 application topically as needed for muscle pain. Apply to feet (Patient not taking: Reported on 11/20/2020)   venlafaxine XR (EFFEXOR-XR) 150 MG 24 hr capsule TAKE ONE CAPSULE BY MOUTH EVERY MORNING and TAKE ONE TABLET BY MOUTH EVERYDAY AT BEDTIME (Patient taking differently: Take 150 mg by mouth in the morning and at bedtime.)   vitamin B-12 (CYANOCOBALAMIN) 500 MCG tablet Take 500 mcg by mouth daily. (Patient not taking: Reported on 11/20/2020)   No facility-administered encounter medications on file as of 11/25/2020.   BP Readings from Last 3 Encounters:  11/20/20 118/80  11/18/20 (!) 170/90  11/13/20 98/64    Lab Results  Component Value Date   HGBA1C 5.5 11/16/2020    Recent OV, Consult or Hospital visit:  Recent medication changes indicated:   Last adherence delivery date:11/06/2020      Patient is due for next adherence delivery on: 12/04/2020  Multiple attempts made to reach patient. Unsuccessful outreach. Will refill based off of last adherence fill.  Left Message 11/25/2020 Left Message 11/26/2020 Left Message 11/27/2020  Multiple medications on hold per  chart - metformin, gabapentin, potassium, rosuvastatin  This delivery to include: Adherence Packaging  30 Days  Will hold all delivery until contact from patient to confirm* Packs: Venlafaxine ER 150mg   take 1 tablet breakfast and 1 tablet bedtime Metformin 1000mg  take 1 tablet breakfast 1 tablet evening meal  Rosuvastatin 10mg  take 1 tablet breakfast Vitamin B12 535mcg take 1 tablet breakfast Vitamin D3 42mcg  take 1 tablet breakfast Alpha Lipoic Acid 600mg  take 1 tablet breakfast and 1 tablet evening meal Biotin 1000mg  take 2 tablets breakfast Gabapentin 300mg  take 2 tablets bedtime  No refill request needed.  Care Gaps: Annual wellness visit in last year? Yes 09/09/2020 Most Recent BP reading: 118/80 on 09/08/202  If Diabetic: Most recent A1C reading: 5.5 on 11/16/2020 Last eye exam / retinopathy screening: 03/2020 Last diabetic foot exam: 06/2020  PCP appointment on 12/10/2020  Debbora Dus, CPP notified  Marijean Niemann, [place] Assistant [phone]  I have reviewed the care management and care coordination activities outlined in this encounter and I am certifying that I agree with the content of this note. Will hold all delivery until contact from patient to confirm considering recent med changes/hospitalization.  Debbora Dus, PharmD Clinical Pharmacist [facility] at [facility] [phone]

## 2020-11-26 ENCOUNTER — Ambulatory Visit: Payer: PRIVATE HEALTH INSURANCE | Admitting: Internal Medicine

## 2020-11-26 ENCOUNTER — Telehealth: Payer: Self-pay

## 2020-11-26 NOTE — Progress Notes (Addendum)
    Chronic Care Management Pharmacy Assistant   Name: Jacqueline Cole  MRN: [id number] DOB: 19-Jan-1951   Reason for Encounter: Patient Assistance Update - Ozempic   Medications: Outpatient Encounter Medications as of 11/26/2020  Medication Sig   amoxicillin (AMOXIL) 500 MG capsule Take 1 capsule (500 mg total) by mouth 3 (three) times daily for 14 days.   Blood Glucose Monitoring Suppl ([place]) w/Device KIT Use to check blood sugar 2 times a day. (Patient not taking: Reported on 11/20/2020)   clarithromycin (BIAXIN) 500 MG tablet Take 1 tablet (500 mg total) by mouth 2 (two) times daily for 14 days.   glucose blood (ONETOUCH VERIO) test strip Use as instructed to check blood sugar 2 times a day. (Patient not taking: Reported on 11/20/2020)   hydrocortisone cream 1 % Apply 1 application topically daily as needed for itching. (Patient not taking: Reported on 11/20/2020)   Lancets (ONETOUCH ULTRASOFT) lancets Use as instructed to check blood sugar 2 times a day. (Patient not taking: Reported on 11/20/2020)   omeprazole (PRILOSEC) 20 MG capsule Take 1 capsule (20 mg total) by mouth 2 (two) times daily before a meal for 14 days.   ondansetron (ZOFRAN-ODT) 4 MG disintegrating tablet Take 1 tablet (4 mg total) by mouth every 8 (eight) hours as needed for nausea or vomiting. (Patient not taking: Reported on 11/20/2020)   Propylene Glycol (SYSTANE COMPLETE OP) Place 3 drops into both eyes 3 (three) times daily. (Patient not taking: Reported on 05/18/4381)   trolamine salicylate (ASPERCREME) 10 % cream Apply 1 application topically as needed for muscle pain. Apply to feet (Patient not taking: Reported on 11/20/2020)   venlafaxine XR (EFFEXOR-XR) 150 MG 24 hr capsule TAKE ONE CAPSULE BY MOUTH EVERY MORNING and TAKE ONE TABLET BY MOUTH EVERYDAY AT BEDTIME (Patient taking differently: Take 150 mg by mouth in the morning and at bedtime.)   vitamin B-12 (CYANOCOBALAMIN) 500 MCG tablet Take 500 mcg by  mouth daily. (Patient not taking: Reported on 11/20/2020)   No facility-administered encounter medications on file as of 11/26/2020.   Patient is enrolled in [employer] assistance program for Ozempic weekly. Reviewed address due to clinic change of location. Patient gets Ozempic delivered to Dr. Arman Filter office so no change needed.  Debbora Dus, CPP notified  Margaretmary Dys, [place] Pharmacy Assistant [phone]  I have reviewed the care management and care coordination activities outlined in this encounter and I am certifying that I agree with the content of this note. No further action required.  Debbora Dus, PharmD Clinical Pharmacist [facility] at [facility] [phone]

## 2020-11-27 ENCOUNTER — Telehealth: Payer: Self-pay | Admitting: *Deleted

## 2020-11-27 NOTE — Chronic Care Management (AMB) (Signed)
  Chronic Care Management   Note  11/27/2020 Name: DAEJAH KLEBBA MRN: [id number] DOB: 12/08/1950  Jacqueline Cole is a 70 y.o. year old female who is a primary care patient of Venia Carbon, MD. I reached out to Jacqueline Cole by phone today in response to a referral sent by Ms. Jonet T Hamrick's PCP Venia Carbon, MD     Ms. Yellowhair was given information about Chronic Care Management services today including:  CCM service includes personalized support from designated clinical staff supervised by her physician, including individualized plan of care and coordination with other care providers 24/7 contact phone numbers for assistance for urgent and routine care needs. Service will only be billed when office clinical staff spend 20 minutes or more in a month to coordinate care. Only one practitioner may furnish and bill the service in a calendar month. The patient may stop CCM services at any time (effective at the end of the month) by phone call to the office staff. The patient will be responsible for cost sharing (co-pay) of up to 20% of the service fee (after annual deductible is met).  Patient agreed to services and verbal consent obtained.   Follow up plan: Telephone appointment with care management team member scheduled for: 12/04/2020 and 12/18/2020  Julian Hy, [place] Management  Direct Dial: [phone]

## 2020-12-04 ENCOUNTER — Other Ambulatory Visit: Payer: Self-pay

## 2020-12-04 ENCOUNTER — Telehealth: Payer: Self-pay

## 2020-12-04 ENCOUNTER — Telehealth: Payer: PRIVATE HEALTH INSURANCE

## 2020-12-04 NOTE — Telephone Encounter (Signed)
  Care Management   Follow Up Note   12/04/2020 Name: Jacqueline Cole MRN: [id number] DOB: [birth year]   Referred by: Venia Carbon, MD Reason for referral : Chronic Care Management (Initial telephone assessment)   An unsuccessful telephone outreach was attempted today. The patient was referred to the case management team for assistance with care management and care coordination.   Follow Up Plan: A HIPPA compliant phone message was left for the patient providing contact information and requesting a return call.   Quinn Plowman RN,BSN,CCM RN Case Manager [place]  [phone]

## 2020-12-05 ENCOUNTER — Telehealth: Payer: Self-pay | Admitting: *Deleted

## 2020-12-05 ENCOUNTER — Ambulatory Visit: Payer: PPO | Admitting: Internal Medicine

## 2020-12-05 NOTE — Chronic Care Management (AMB) (Signed)
  Care Management   Note  12/05/2020 Name: Jacqueline Cole MRN: [id number] DOB: Oct 18, 1950  Jacqueline Cole is a 70 y.o. year old female who is a primary care patient of Venia Carbon, MD and is actively engaged with the care management team. I reached out to Jacqueline Cole by phone today to assist with re-scheduling an initial visit with the RN Case Manager  Follow up plan: Unsuccessful telephone outreach attempt made. A HIPAA compliant phone message was left for the patient providing contact information and requesting a return call.   Julian Hy, [place] Management  Direct Dial: [phone]

## 2020-12-10 ENCOUNTER — Encounter: Payer: Self-pay | Admitting: Internal Medicine

## 2020-12-10 ENCOUNTER — Ambulatory Visit (INDEPENDENT_AMBULATORY_CARE_PROVIDER_SITE_OTHER): Payer: PRIVATE HEALTH INSURANCE | Admitting: Internal Medicine

## 2020-12-10 ENCOUNTER — Other Ambulatory Visit: Payer: Self-pay

## 2020-12-10 DIAGNOSIS — E1142 Type 2 diabetes mellitus with diabetic polyneuropathy: Secondary | ICD-10-CM | POA: Diagnosis not present

## 2020-12-10 DIAGNOSIS — R600 Localized edema: Secondary | ICD-10-CM | POA: Diagnosis not present

## 2020-12-10 DIAGNOSIS — F331 Major depressive disorder, recurrent, moderate: Secondary | ICD-10-CM | POA: Diagnosis not present

## 2020-12-10 DIAGNOSIS — R609 Edema, unspecified: Secondary | ICD-10-CM | POA: Insufficient documentation

## 2020-12-10 NOTE — Progress Notes (Signed)
Subjective:    Patient ID: Jacqueline Cole, female    DOB: 1950-07-12, 70 y.o.   MRN: [id number]  HPI Here for follow up of hypovolemia, etc This visit occurred during the SARS-CoV-2 public health emergency.  Safety protocols were in place, including screening questions prior to the visit, additional usage of staff PPE, and extensive cleaning of exam room while observing appropriate contact time as indicated for disinfecting solutions.   Having trouble with swelling in legs Drinking lots of water Sugars generally 80-135. Only twice over 200 Usually ~100 fasting  "I cannot get enough to eat" Trying to do well with vegetables No SOB at rest---does have DOE Dizziness is resolving--improving as she starts doing more  Current Outpatient Medications on File Prior to Visit  Medication Sig Dispense Refill   Blood Glucose Monitoring Suppl ([place]) w/Device KIT Use to check blood sugar 2 times a day. 1 kit 0   glucose blood (ONETOUCH VERIO) test strip Use as instructed to check blood sugar 2 times a day. 200 each 12   hydrocortisone cream 1 % Apply 1 application topically daily as needed for itching.     Lancets (ONETOUCH ULTRASOFT) lancets Use as instructed to check blood sugar 2 times a day. 200 each 12   ondansetron (ZOFRAN-ODT) 4 MG disintegrating tablet Take 1 tablet (4 mg total) by mouth every 8 (eight) hours as needed for nausea or vomiting. 30 tablet 0   Propylene Glycol (SYSTANE COMPLETE OP) Place 3 drops into both eyes 3 (three) times daily.     trolamine salicylate (ASPERCREME) 10 % cream Apply 1 application topically as needed for muscle pain. Apply to feet     venlafaxine XR (EFFEXOR-XR) 150 MG 24 hr capsule TAKE ONE CAPSULE BY MOUTH EVERY MORNING and TAKE ONE TABLET BY MOUTH EVERYDAY AT BEDTIME (Patient taking differently: Take 150 mg by mouth in the morning and at bedtime.) 180 capsule 0   omeprazole (PRILOSEC) 20 MG capsule Take 1 capsule (20 mg total) by mouth  2 (two) times daily before a meal for 14 days. 90 capsule 3   vitamin B-12 (CYANOCOBALAMIN) 500 MCG tablet Take 500 mcg by mouth daily. (Patient not taking: Reported on 12/10/2020)     No current facility-administered medications on file prior to visit.    Allergies  Allergen Reactions   Onglyza [Saxagliptin] Hives and Nausea And Vomiting   Liraglutide Nausea Only    Can't eat, nausea, higher sugars    Past Medical History:  Diagnosis Date   Depression    Diabetes mellitus type II    Hyperlipemia    Major depressive disorder, recurrent, in remission ([place]) 09/01/2006   Qualifier: Diagnosis of  By: Lelon Mast     Obesity    Osteopenia    Syncope 1998   DM diagnosis    Past Surgical History:  Procedure Laterality Date   ABDOMINAL HYSTERECTOMY  ~2005   and BSO   BREAST BIOPSY Right 01/05/2019   rt stereo bx 2 areas 1st area ribbon clip   BREAST BIOPSY Right 01/05/2019   rt stereo bx 2nd area coil clip   CHOLECYSTECTOMY  1975   COSMETIC SURGERY     Injury Right face as a child   ESOPHAGOGASTRODUODENOSCOPY (EGD) WITH PROPOFOL N/A 11/18/2020   Procedure: ESOPHAGOGASTRODUODENOSCOPY (EGD) WITH PROPOFOL;  Surgeon: Jonathon Bellows, MD;  Location: [facility] ENDOSCOPY;  Service: Gastroenterology;  Laterality: N/A;   VAGINAL DELIVERY     X 2    Family  History  Problem Relation Age of Onset   Stroke Father        CVA   Heart disease Father        MI   Cancer Sister        breast cancer   Breast cancer Sister 30   Heart disease Brother        MI   Cancer Brother        Lung    Social History   Socioeconomic History   Marital status: Married    Spouse name: Not on file   Number of children: 2   Years of education: Not on file   Highest education level: Not on file  Occupational History    Comment: "Just up and quit" at [employer] Use   Smoking status: Former    Types: Cigarettes    Quit date: 03/15/1993    Years since quitting: 27.7   Smokeless tobacco: Never   Vaping Use   Vaping Use: Never used  Substance and Sexual Activity   Alcohol use: No    Alcohol/week: 0.0 standard drinks   Drug use: No   Sexual activity: Not on file  Other Topics Concern   Not on file  Social History Narrative   Has living will   Husband, then daughter [name], have health care POA   Would reluctantly accept resuscitation but no life prolonging measures   Social Determinants of Health   Financial Resource Strain: Low Risk    Difficulty of Paying Living Expenses: Not very hard  Food Insecurity: Not on file  Transportation Needs: Not on file  Physical Activity: Not on file  Stress: Not on file  Social Connections: Not on file  Intimate Partner Violence: Not on file   Review of Systems Sleeps flat and no PND Nocturia x 2-3 in general Has been putting salt on her food Some slow bowels---discussed miralax/senna    Objective:   Physical Exam Constitutional:      Appearance: Normal appearance.  Cardiovascular:     Rate and Rhythm: Normal rate and regular rhythm.     Heart sounds: No murmur heard.   No gallop.  Pulmonary:     Effort: Pulmonary effort is normal.     Breath sounds: Normal breath sounds. No wheezing or rales.  Musculoskeletal:     Cervical back: Neck supple.     Comments: 1+ edema in calves  Lymphadenopathy:     Cervical: No cervical adenopathy.  Neurological:     Mental Status: She is alert.           Assessment & Plan:

## 2020-12-10 NOTE — Assessment & Plan Note (Signed)
Mood is better now that she feels better Will continue the venlafaxine

## 2020-12-10 NOTE — Assessment & Plan Note (Signed)
Does not appear to be CHF and didn't have renal/hepatic failure Likely from salt and fluid intake Discussed cutting back Can use elevation/support hose If worsens, will consider low dose furosemide

## 2020-12-10 NOTE — Assessment & Plan Note (Signed)
Sugars still okay with just her weight loss They will continue to monitor daily ---mostly fasting Would consider restarting low dose insulin

## 2020-12-11 NOTE — Chronic Care Management (AMB) (Signed)
  Care Management   Note  12/11/2020 Name: Jacqueline Cole MRN: [id number] DOB: 1950-08-03  Jacqueline Cole is a 70 y.o. year old female who is a primary care patient of Venia Carbon, MD and is actively engaged with the care management team. I reached out to Jacqueline Cole by phone today to assist with re-scheduling an initial visit with the RN Case Manager  Follow up plan: 2nd attempt Unsuccessful telephone outreach attempt made. A HIPAA compliant phone message was left for the patient providing contact information and requesting a return call.   Julian Hy, [place] Management  Direct Dial: [phone]

## 2020-12-16 NOTE — Chronic Care Management (AMB) (Signed)
  Care Management   Note  12/16/2020 Name: Jacqueline Cole MRN: [id number] DOB: [birth year]  Jacqueline Cole is a 70 y.o. year old female who is a primary care patient of Venia Carbon, MD and is actively engaged with the care management team. I reached out to Jacqueline Cole by phone today to assist with re-scheduling an initial visit with the RN Case Manager  Follow up plan: We have been unable to make contact with the patient for follow up. The care management team is available to follow up with the patient after provider conversation with the patient regarding recommendation for care management engagement and subsequent re-referral to the care management team.   Julian Hy, [place] Management  Direct Dial: [phone]

## 2020-12-17 ENCOUNTER — Telehealth: Payer: PRIVATE HEALTH INSURANCE

## 2020-12-18 ENCOUNTER — Telehealth: Payer: PRIVATE HEALTH INSURANCE

## 2020-12-18 ENCOUNTER — Telehealth: Payer: Self-pay | Admitting: *Deleted

## 2020-12-18 NOTE — Telephone Encounter (Signed)
  Care Management   Follow Up Note   12/18/2020 Name: Jacqueline Cole MRN: [id number] DOB: 07-15-1950   Referred by: Venia Carbon, MD Reason for referral : No chief complaint on file.   An unsuccessful telephone outreach was attempted today. The patient was referred to the case management team for assistance with care management and care coordination.   Follow Up Plan: The care management team will reach out to the patient again over the next 7 days.   Eduard Clos MSW, LCSW Licensed Clinical Social Worker [place] [phone]

## 2020-12-19 NOTE — Chronic Care Management (AMB) (Addendum)
  Care Management   Note  12/19/2020 Name: EZABELLA TESKA MRN: [id number] DOB: 01/29/1951  Jacqueline Cole is a 70 y.o. year old female who is a primary care patient of Venia Carbon, MD and is actively engaged with the care management team. I reached out to Jacqueline Cole by phone today to assist with re-scheduling an initial visit with the Licensed Clinical Social Worker  Follow up plan: Telephone appointment with care management team member scheduled for: 01/01/2021  Julian Hy, [place], [place] Management  Direct Dial: [phone]

## 2020-12-23 ENCOUNTER — Telehealth: Payer: Self-pay

## 2020-12-23 NOTE — Progress Notes (Signed)
[place]   Chronic Care Management Pharmacy Assistant   Name: Jacqueline Cole  MRN: [id number] DOB: September 12, 1950  Reason for Encounter: Medication Adherence and Delivery Coordination   Recent office visits:  12/10/2020 - [name], MD - Patient presented for 3 month follow up for localized Edema, Diabetes and MDD. No medication changes.   Recent consult visits:  None since last CCM contact  Hospital visits:  None since last CCM contact  Medications: Outpatient Encounter Medications as of 12/23/2020  Medication Sig   Blood Glucose Monitoring Suppl (ONETOUCH VERIO FLEX SYSTEM) w/Device KIT Use to check blood sugar 2 times a day.   glucose blood (ONETOUCH VERIO) test strip Use as instructed to check blood sugar 2 times a day.   hydrocortisone cream 1 % Apply 1 application topically daily as needed for itching.   Lancets (ONETOUCH ULTRASOFT) lancets Use as instructed to check blood sugar 2 times a day.   omeprazole (PRILOSEC) 20 MG capsule Take 1 capsule (20 mg total) by mouth 2 (two) times daily before a meal for 14 days.   ondansetron (ZOFRAN-ODT) 4 MG disintegrating tablet Take 1 tablet (4 mg total) by mouth every 8 (eight) hours as needed for nausea or vomiting.   Propylene Glycol (SYSTANE COMPLETE OP) Place 3 drops into both eyes 3 (three) times daily.   trolamine salicylate (ASPERCREME) 10 % cream Apply 1 application topically as needed for muscle pain. Apply to feet   venlafaxine XR (EFFEXOR-XR) 150 MG 24 hr capsule TAKE ONE CAPSULE BY MOUTH EVERY MORNING and TAKE ONE TABLET BY MOUTH EVERYDAY AT BEDTIME (Patient taking differently: Take 150 mg by mouth in the morning and at bedtime.)   vitamin B-12 (CYANOCOBALAMIN) 500 MCG tablet Take 500 mcg by mouth daily. (Patient not taking: Reported on 12/10/2020)   No facility-administered encounter medications on file as of 12/23/2020.   BP Readings from Last 3 Encounters:  12/10/20 102/68  11/20/20 118/80  11/18/20 (!) 170/90    Lab  Results  Component Value Date   HGBA1C 5.5 11/16/2020    Recent OV, Consult or Hospital visit:  No medication changes indicated  Last adherence delivery date: 12/04/2020      Patient is due for next adherence delivery on: 01/02/2021  Multiple attempts made to reach patient. Unsuccessful outreach. Will refill based off of last adherence fill.   This delivery to include: Adherence Packaging  30 Days  Packs:  Venlafaxine ER 150mg   take 1 tablet breakfast and 1 tablet bedtime Metformin 1000mg  take 1 tablet breakfast 1 tablet evening meal  Rosuvastatin 10mg  take 1 tablet breakfast Vitamin B12 544mcg take 1 tablet breakfast Vitamin D3 54mcg  take 1 tablet breakfast Alpha Lipoic Acid 600mg  take 1 tablet breakfast and 1 tablet evening meal Biotin 1000mg  take 2 tablets breakfast Gabapentin 300mg  take 2 tablets bedtime  No refill request needed.  Delivery scheduled for 01/02/2021. Unable to speak with patient to confirm date.   Annual wellness visit in last year? No Most Recent BP reading: 102/68 on 12/10/2020  If Diabetic: Most recent A1C reading: 5.5 on 11/16/2020 Last eye exam / retinopathy screening: 04/25/2017 Last diabetic foot exam: Up to date  Debbora Dus, CPP notified  Marijean Niemann, [place] [phone]   Time Spent: 40 Minutes

## 2020-12-26 NOTE — Telephone Encounter (Addendum)
Patient never confirmed last delivery. She has not received any maintenance meds since 10/31/20. Unable to reach today despite multiple attempts. Mailbox is full. Will attempt one more month and then patient will be unenrolled from program.  Debbora Dus, PharmD Clinical Pharmacist [facility] at [facility] [phone]

## 2021-01-01 ENCOUNTER — Telehealth: Payer: PRIVATE HEALTH INSURANCE

## 2021-01-01 ENCOUNTER — Telehealth: Payer: Self-pay | Admitting: Internal Medicine

## 2021-01-01 NOTE — Telephone Encounter (Signed)
Pt returning a call from care managment

## 2021-01-06 ENCOUNTER — Telehealth: Payer: Self-pay

## 2021-01-06 NOTE — Progress Notes (Addendum)
Attempted to contact patient on 01/12/2021. No answer; left voicemail.  Attempted to contact patient on 01/07/2021. No answer, left voicemail.   Attempted to contact patient on 01/06/2021. No answer; left voicemail. Will attempt again tomorrow.   Debbora Dus, CPP notified  Marijean Niemann, [place] Clinical Pharmacy Assistant [phone]   Time Spent: 10 Minutes

## 2021-01-06 NOTE — Progress Notes (Signed)
    Chronic Care Management Pharmacy Assistant  Entered in Error

## 2021-01-07 ENCOUNTER — Other Ambulatory Visit: Payer: Self-pay

## 2021-01-07 ENCOUNTER — Encounter: Payer: Self-pay | Admitting: Internal Medicine

## 2021-01-07 ENCOUNTER — Ambulatory Visit (INDEPENDENT_AMBULATORY_CARE_PROVIDER_SITE_OTHER): Payer: PPO | Admitting: Internal Medicine

## 2021-01-07 VITALS — BP 118/80 | HR 71 | Temp 97.1°F | Ht 65.0 in | Wt 181.0 lb

## 2021-01-07 DIAGNOSIS — F331 Major depressive disorder, recurrent, moderate: Secondary | ICD-10-CM

## 2021-01-07 DIAGNOSIS — Z23 Encounter for immunization: Secondary | ICD-10-CM

## 2021-01-07 DIAGNOSIS — E1142 Type 2 diabetes mellitus with diabetic polyneuropathy: Secondary | ICD-10-CM

## 2021-01-07 MED ORDER — INSULIN GLARGINE 100 UNIT/ML ~~LOC~~ SOLN: 10.0000 [IU] | Freq: Every day | SUBCUTANEOUS | 5 refills | Status: DC

## 2021-01-07 NOTE — Addendum Note (Signed)
Addended by: Pilar Grammes on: 01/07/2021 10:52 AM   Modules accepted: Orders

## 2021-01-07 NOTE — Progress Notes (Signed)
Subjective:    Patient ID: Jacqueline Cole, female    DOB: August 26, 1950, 70 y.o.   MRN: [id number]  HPI Here with husband for follow up of diabetes and other medical conditions This visit occurred during the SARS-CoV-2 public health emergency.  Safety protocols were in place, including screening questions prior to the visit, additional usage of staff PPE, and extensive cleaning of exam room while observing appropriate contact time as indicated for disinfecting solutions.   Nausea and vomiting are all gone Appetite is back to normal--eating healthy Weight back up just slightly "I feel great"  Some feet swelling No SOB No chest pain Occasional dizzy feeling --relates to the gabapentin that she restarted  Checking sugars dailly Has had over 200 three times---mostly 130-160  Current Outpatient Medications on File Prior to Visit  Medication Sig Dispense Refill   Blood Glucose Monitoring Suppl (ONETOUCH VERIO FLEX SYSTEM) w/Device KIT Use to check blood sugar 2 times a day. 1 kit 0   gabapentin (NEURONTIN) 300 MG capsule Take 600 mg by mouth at bedtime.     glucose blood (ONETOUCH VERIO) test strip Use as instructed to check blood sugar 2 times a day. 200 each 12   hydrocortisone cream 1 % Apply 1 application topically daily as needed for itching.     Lancets (ONETOUCH ULTRASOFT) lancets Use as instructed to check blood sugar 2 times a day. 200 each 12   Propylene Glycol (SYSTANE COMPLETE OP) Place 3 drops into both eyes 3 (three) times daily.     trolamine salicylate (ASPERCREME) 10 % cream Apply 1 application topically as needed for muscle pain. Apply to feet     venlafaxine XR (EFFEXOR-XR) 150 MG 24 hr capsule TAKE ONE CAPSULE BY MOUTH EVERY MORNING and TAKE ONE TABLET BY MOUTH EVERYDAY AT BEDTIME (Patient taking differently: Take 150 mg by mouth in the morning and at bedtime.) 180 capsule 0   vitamin B-12 (CYANOCOBALAMIN) 500 MCG tablet Take 500 mcg by mouth daily.     omeprazole  (PRILOSEC) 20 MG capsule Take 1 capsule (20 mg total) by mouth 2 (two) times daily before a meal for 14 days. 90 capsule 3   No current facility-administered medications on file prior to visit.    Allergies  Allergen Reactions   Onglyza [Saxagliptin] Hives and Nausea And Vomiting   Liraglutide Nausea Only    Can't eat, nausea, higher sugars    Past Medical History:  Diagnosis Date   Depression    Diabetes mellitus type II    Hyperlipemia    Major depressive disorder, recurrent, in remission ([place]) 09/01/2006   Qualifier: Diagnosis of  By: Lelon Mast     Obesity    Osteopenia    Syncope 1998   DM diagnosis    Past Surgical History:  Procedure Laterality Date   ABDOMINAL HYSTERECTOMY  ~2005   and BSO   BREAST BIOPSY Right 01/05/2019   rt stereo bx 2 areas 1st area ribbon clip   BREAST BIOPSY Right 01/05/2019   rt stereo bx 2nd area coil clip   CHOLECYSTECTOMY  1975   COSMETIC SURGERY     Injury Right face as a child   ESOPHAGOGASTRODUODENOSCOPY (EGD) WITH PROPOFOL N/A 11/18/2020   Procedure: ESOPHAGOGASTRODUODENOSCOPY (EGD) WITH PROPOFOL;  Surgeon: Jonathon Bellows, MD;  [facility] ENDOSCOPY;  Service: Gastroenterology;  Laterality: N/A;   VAGINAL DELIVERY     X 2    Family History  Problem Relation Age of Onset   Stroke Father  CVA   Heart disease Father        MI   Cancer Sister        breast cancer   Breast cancer Sister 69   Heart disease Brother        MI   Cancer Brother        Lung    Social History   Socioeconomic History   Marital status: Married    Spouse name: Not on file   Number of children: 2   Years of education: Not on file   Highest education level: Not on file  Occupational History    Comment: "Just up and quit" at [employer] Use   Smoking status: Former    Types: Cigarettes    Quit date: 03/15/1993    Years since quitting: 27.8   Smokeless tobacco: Never  Vaping Use   Vaping Use: Never used  Substance and Sexual  Activity   Alcohol use: No    Alcohol/week: 0.0 standard drinks   Drug use: No   Sexual activity: Not on file  Other Topics Concern   Not on file  Social History Narrative   Has living will   Husband, then daughter [name], have health care POA   Would reluctantly accept resuscitation but no life prolonging measures   Social Determinants of Health   Financial Resource Strain: Low Risk    Difficulty of Paying Living Expenses: Not very hard  Food Insecurity: Not on file  Transportation Needs: Not on file  Physical Activity: Not on file  Stress: Not on file  Social Connections: Not on file  Intimate Partner Violence: Not on file  'Review of Systems Sleeping well No exercise---foot pain with walking Doing all personal care herself again    Objective:   Physical Exam Constitutional:      Appearance: Normal appearance.  Cardiovascular:     Rate and Rhythm: Normal rate and regular rhythm.     Heart sounds: No murmur heard.   No gallop.     Comments: Faint pedal pulses Pulmonary:     Effort: Pulmonary effort is normal.     Breath sounds: Normal breath sounds. No wheezing or rales.  Musculoskeletal:     Cervical back: Neck supple.     Comments: Slight fullness in calves but no true edema  Lymphadenopathy:     Cervical: No cervical adenopathy.  Neurological:     Mental Status: She is alert.  Psychiatric:        Mood and Affect: Mood normal.        Behavior: Behavior normal.           Assessment & Plan:

## 2021-01-07 NOTE — Assessment & Plan Note (Signed)
Mood has been good now---she is back to feeling good Will continue the venlafaxine

## 2021-01-07 NOTE — Patient Instructions (Signed)
Restart the lantus at 10 units in the evening. If your morning sugars stay over 130, you can increase slightly every few days as needed. If they run low, cut back and let me know

## 2021-01-07 NOTE — Assessment & Plan Note (Signed)
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

## 2021-01-09 ENCOUNTER — Telehealth: Payer: Self-pay

## 2021-01-09 NOTE — Progress Notes (Addendum)
Chronic Care Management Pharmacy Assistant   Name: Jacqueline Cole  MRN: [id number] DOB: March 15, 1951  Reason for Encounter: CCM (Medication Adherence and Delivery Coordination)   Recent office visits:  01/07/2021 - [name], MD - Patient presented for 1 month follow up for and head congestion/ear fullness. Start: insulin glargine (LANTUS) 100 UNIT/ML injection. Stop: ondansetron (ZOFRAN-ODT) 4 MG disintegrating tablet due to completed course. Administered: Fluad Quad (high dose 65+).  Recent consult visits:  None since last [facility] visits:  None since last CCM Contact  Medications: Outpatient Encounter Medications as of 01/09/2021  Medication Sig   Blood Glucose Monitoring Suppl (ONETOUCH VERIO FLEX SYSTEM) w/Device KIT Use to check blood sugar 2 times a day.   gabapentin (NEURONTIN) 300 MG capsule Take 600 mg by mouth at bedtime.   glucose blood (ONETOUCH VERIO) test strip Use as instructed to check blood sugar 2 times a day.   hydrocortisone cream 1 % Apply 1 application topically daily as needed for itching.   insulin glargine (LANTUS) 100 UNIT/ML injection Inject 0.1-0.2 mLs (10-20 Units total) into the skin at bedtime. INJECT 25-50 UNITS SUBCUTANEOUSLY ONCE DAILY AT BEDTIME   Lancets (ONETOUCH ULTRASOFT) lancets Use as instructed to check blood sugar 2 times a day.   omeprazole (PRILOSEC) 20 MG capsule Take 1 capsule (20 mg total) by mouth 2 (two) times daily before a meal for 14 days.   Propylene Glycol (SYSTANE COMPLETE OP) Place 3 drops into both eyes 3 (three) times daily.   trolamine salicylate (ASPERCREME) 10 % cream Apply 1 application topically as needed for muscle pain. Apply to feet   venlafaxine XR (EFFEXOR-XR) 150 MG 24 hr capsule TAKE ONE CAPSULE BY MOUTH EVERY MORNING and TAKE ONE TABLET BY MOUTH EVERYDAY AT BEDTIME (Patient taking differently: Take 150 mg by mouth in the morning and at bedtime.)   vitamin B-12 (CYANOCOBALAMIN) 500 MCG tablet Take  500 mcg by mouth daily.   No facility-administered encounter medications on file as of 01/09/2021.    BP Readings from Last 3 Encounters:  01/07/21 118/80  12/10/20 102/68  11/20/20 118/80    Lab Results  Component Value Date   HGBA1C 5.5 11/16/2020     Recent OV, Consult or Hospital visit:  Recent medication changes indicated:  Start: insulin glargine (LANTUS) 100 UNIT/ML injection. Stop: ondansetron (ZOFRAN-ODT) 4 MG disintegrating tablet due to completed course  Last adherence delivery date: 01/02/2021      Patient is due for next adherence delivery on: Unable to reach patient last 2 months. Fill to be based off last fill date.   Multiple attempts made to reach patient. Unsuccessful outreach. Will refill based off of last adherence fill.   This delivery to include: Adherence Packaging  30 Days  Packs: Venlafaxine ER 150mg   take 1 tablet breakfast and 1 tablet bedtime Metformin 1000mg  take 1 tablet breakfast 1 tablet evening meal  Rosuvastatin 10mg  take 1 tablet breakfast Vitamin B12 551mcg take 1 tablet breakfast Vitamin D3 45mcg  take 1 tablet breakfast Alpha Lipoic Acid 600mg  take 1 tablet breakfast and 1 tablet evening meal Biotin 1000mg  take 2 tablets breakfast Gabapentin 300mg  take 2 tablets bedtime  Delivery scheduled for Unable to reach patient last 2 months. Fill to be based off last fill. . Unable to speak with patient to confirm date.   Annual wellness visit in last year? No Most Recent BP reading: 118/80 on 01/07/2021  If Diabetic: Most recent A1C reading: 5.5 on 11/16/2020  Last eye exam / retinopathy screening:04/25/2017 Last diabetic foot exam: Up to date  Debbora Dus, CPP notified  Marijean Niemann, [place] Assistant [phone]  I have reviewed the care management and care coordination activities outlined in this encounter and I am certifying that I agree with the content of this note. No further action required.  Debbora Dus,  PharmD Clinical Pharmacist [facility] at [facility] [phone]

## 2021-01-15 ENCOUNTER — Telehealth: Payer: Self-pay

## 2021-01-15 NOTE — Chronic Care Management (AMB) (Signed)
    Chronic Care Management Pharmacy Assistant   Name: Jacqueline Cole  MRN: [id number] DOB: [birth year]   Reason for Encounter: Reminder Call   Conditions to be addressed/monitored: HLD, DMII, and CKD Stage 3b   Medications: Outpatient Encounter Medications as of 01/15/2021  Medication Sig   Blood Glucose Monitoring Suppl ([place]) w/Device KIT Use to check blood sugar 2 times a day.   gabapentin (NEURONTIN) 300 MG capsule Take 600 mg by mouth at bedtime.   glucose blood (ONETOUCH VERIO) test strip Use as instructed to check blood sugar 2 times a day.   hydrocortisone cream 1 % Apply 1 application topically daily as needed for itching.   insulin glargine (LANTUS) 100 UNIT/ML injection Inject 0.1-0.2 mLs (10-20 Units total) into the skin at bedtime. INJECT 25-50 UNITS SUBCUTANEOUSLY ONCE DAILY AT BEDTIME   Lancets (ONETOUCH ULTRASOFT) lancets Use as instructed to check blood sugar 2 times a day.   omeprazole (PRILOSEC) 20 MG capsule Take 1 capsule (20 mg total) by mouth 2 (two) times daily before a meal for 14 days.   Propylene Glycol (SYSTANE COMPLETE OP) Place 3 drops into both eyes 3 (three) times daily.   trolamine salicylate (ASPERCREME) 10 % cream Apply 1 application topically as needed for muscle pain. Apply to feet   venlafaxine XR (EFFEXOR-XR) 150 MG 24 hr capsule TAKE ONE CAPSULE BY MOUTH EVERY MORNING and TAKE ONE TABLET BY MOUTH EVERYDAY AT BEDTIME (Patient taking differently: Take 150 mg by mouth in the morning and at bedtime.)   vitamin B-12 (CYANOCOBALAMIN) 500 MCG tablet Take 500 mcg by mouth daily.   No facility-administered encounter medications on file as of 01/15/2021.    [name] did not answer the telephone to remind her of her upcoming telephone visit with Debbora Dus on 01/20/21 at 9:00am. Patient was reminded to have all medications, supplements and any blood glucose and blood pressure readings available for review at appointment. Detailed  message left for the patient.    Star Rating Drugs: Medication:  Last Fill: Day Supply Lantus   01/08/21 [place], CPP notified  [name], [place] Assistant [phone]  Total time spent for month CPA: 10 min

## 2021-01-15 NOTE — Progress Notes (Addendum)
    Chronic Care Management Pharmacy Assistant   Name: Jacqueline Cole  MRN: [id number] DOB: November 25, 1950   Reason for Encounter: Refill Request  Patient's husband called and left a message that patient needed refills. I tried calling him back on both numbers to specify which medications with no answer; left message.   Debbora Dus, CPP notified  Marijean Niemann, [place] Clinical Pharmacy Assistant [phone]   Time Spent: 10 Minutes

## 2021-01-16 ENCOUNTER — Telehealth: Payer: Self-pay

## 2021-01-16 NOTE — Progress Notes (Addendum)
Chronic Care Management Pharmacy Assistant   Name: Jacqueline Cole  MRN: [id number] DOB: 1950-04-30  Reason for Encounter: CCM (Medication Adherence and Delivery Coordination)   Recent office visits:  01/07/2021 - [name], MD - Patient presented for 1 month follow up for and head congestion/ear fullness. Start: insulin glargine (LANTUS) 100 UNIT/ML injection. Stop: ondansetron (ZOFRAN-ODT) 4 MG disintegrating tablet due to completed course. Administered: Fluad Quad (high dose 65+).  Recent consult visits:  None since last CCM contact  Hospital visits:  None since last CCM contact  Medications: Outpatient Encounter Medications as of 01/16/2021  Medication Sig   Blood Glucose Monitoring Suppl (ONETOUCH VERIO FLEX SYSTEM) w/Device KIT Use to check blood sugar 2 times a day.   gabapentin (NEURONTIN) 300 MG capsule Take 600 mg by mouth at bedtime.   glucose blood (ONETOUCH VERIO) test strip Use as instructed to check blood sugar 2 times a day.   hydrocortisone cream 1 % Apply 1 application topically daily as needed for itching.   insulin glargine (LANTUS) 100 UNIT/ML injection Inject 0.1-0.2 mLs (10-20 Units total) into the skin at bedtime. INJECT 25-50 UNITS SUBCUTANEOUSLY ONCE DAILY AT BEDTIME   Lancets (ONETOUCH ULTRASOFT) lancets Use as instructed to check blood sugar 2 times a day.   omeprazole (PRILOSEC) 20 MG capsule Take 1 capsule (20 mg total) by mouth 2 (two) times daily before a meal for 14 days.   Propylene Glycol (SYSTANE COMPLETE OP) Place 3 drops into both eyes 3 (three) times daily.   trolamine salicylate (ASPERCREME) 10 % cream Apply 1 application topically as needed for muscle pain. Apply to feet   venlafaxine XR (EFFEXOR-XR) 150 MG 24 hr capsule TAKE ONE CAPSULE BY MOUTH EVERY MORNING and TAKE ONE TABLET BY MOUTH EVERYDAY AT BEDTIME (Patient taking differently: Take 150 mg by mouth in the morning and at bedtime.)   vitamin B-12 (CYANOCOBALAMIN) 500 MCG tablet Take  500 mcg by mouth daily.   No facility-administered encounter medications on file as of 01/16/2021.   BP Readings from Last 3 Encounters:  01/07/21 118/80  12/10/20 102/68  11/20/20 118/80    Lab Results  Component Value Date   HGBA1C 5.5 11/16/2020   Recent OV, Consult or Hospital visit:  Recent medication changes indicated:  Patient's husband states patient is currently off all oral medications except for: Venlafaxine ER 150mg , Gabapentin 300mg , Vit B 12 and Vit D.  Last adherence delivery date: 12/04/2020   Patient is due for next adherence delivery on: 01/19/2021  Spoke with patient on 01/16/2021 reviewed medications and coordinated delivery.  This delivery to include: Adherence Packaging  30 Days  Packs: Venlafaxine ER 150mg   take 1 tablet breakfast and 1 tablet bedtime Vitamin B12 574mcg take 1 tablet breakfast Vitamin D3 21mcg  take 1 tablet breakfast Gabapentin 300mg  take 2 tablets bedtime  VIAL medications: None  Patient declined the following medications this month: Metformin 1000mg  take 1 tablet breakfast 1 tablet evening meal  Rosuvastatin 10mg  take 1 tablet breakfast Alpha Lipoic Acid 600mg  take 1 tablet breakfast and 1 tablet evening meal Biotin 1000mg  take 2 tablets breakfast  Any concerns about your medications? Yes Patient's husband states patient is currently out of all medications.   How often do you forget or accidentally miss a dose? Never  Do you use a pillbox? No  Is patient in packaging Yes  If yes  What is the date on your next pill pack? Patient does not have any packs remaining  Any concerns or issues with your packaging? No  No refill request needed.  Confirmed delivery date of 01/19/2021, advised patient that pharmacy will contact them the morning of delivery.  Recent blood pressure readings are as follows: Patient does not take home readings.   Recent blood glucose readings are as follows: Patient's husband takes her glucose readings  in the morning before patient eats. 11/4 - 87 11/3 - 103 11/2 - 168 (middle of afternoon)  Annual wellness visit in last year? Yes Most Recent BP reading: 118/80 on 01/07/2021  If Diabetic: Most recent A1C reading: 5.5 on 11/16/2020 Last eye exam / retinopathy screening: 04/25/2017 Last diabetic foot exam: Up to date  Debbora Dus, CPP notified  Marijean Niemann, [place] Assistant [phone]   I have reviewed the care management and care coordination activities outlined in this encounter and I am certifying that I agree with the content of this note. No further action required.  Debbora Dus, PharmD Clinical Pharmacist [facility] at [facility] [phone]

## 2021-01-17 IMAGING — MG DIGITAL DIAGNOSTIC BILAT W/ TOMO W/ CAD
8 of 11 series · 8 of 27 positions shown · non-contrast
Comparison: Previous exam(s).

CLINICAL DATA: Patient was called back from screening mammogram for
right breast calcifications and a possible mass in the left breast.

EXAM:
DIGITAL DIAGNOSTIC BILATERAL MAMMOGRAM WITH CAD AND TOMO
ULTRASOUND BILATERAL BREAST

[R CC (1 of 2)]
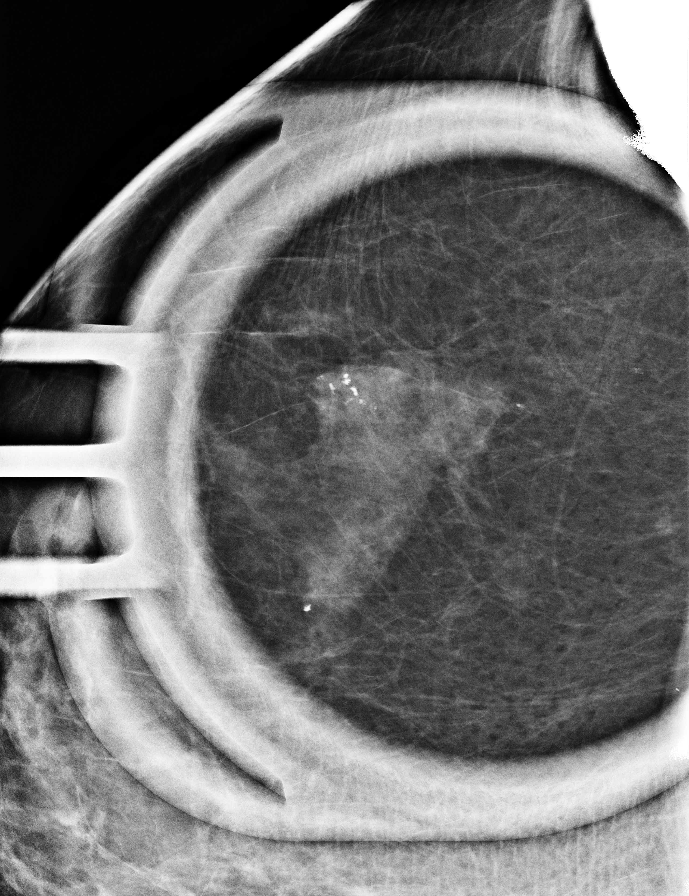

[R CC (2 of 2)]
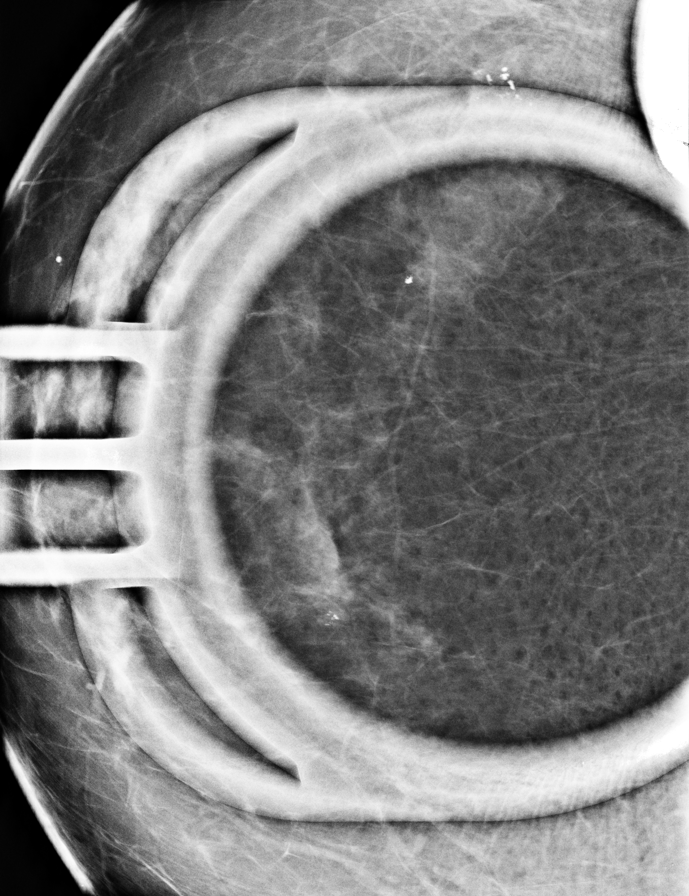

[R LM]
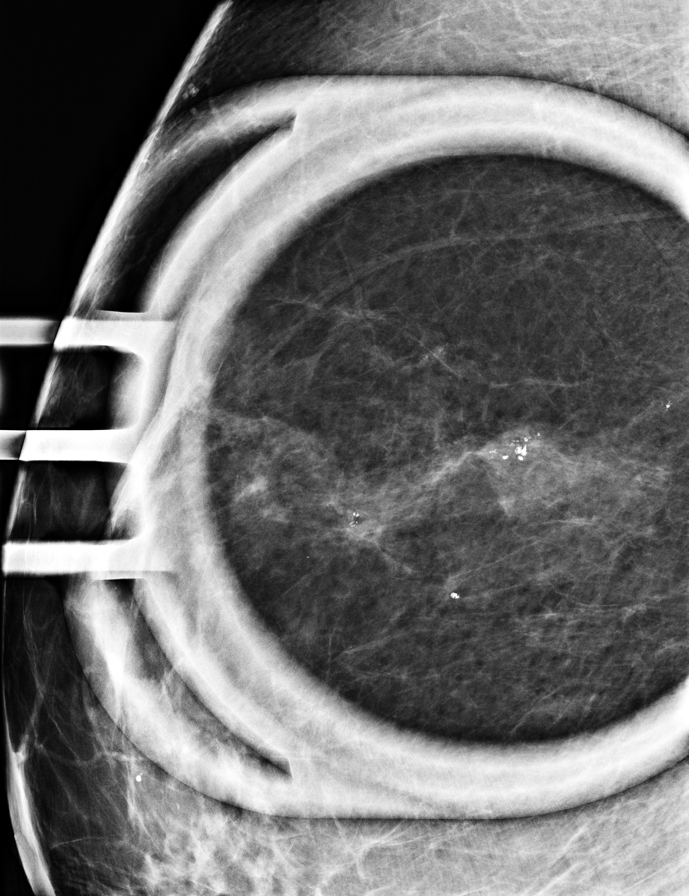

[L MLO synth-2D (1 of 2)]
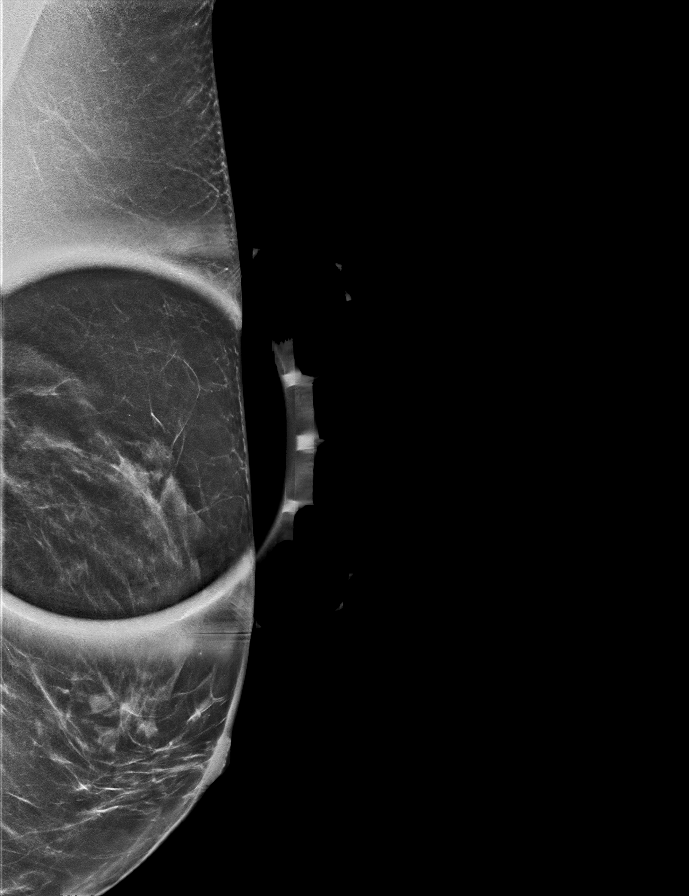

[L MLO synth-2D (2 of 2)]
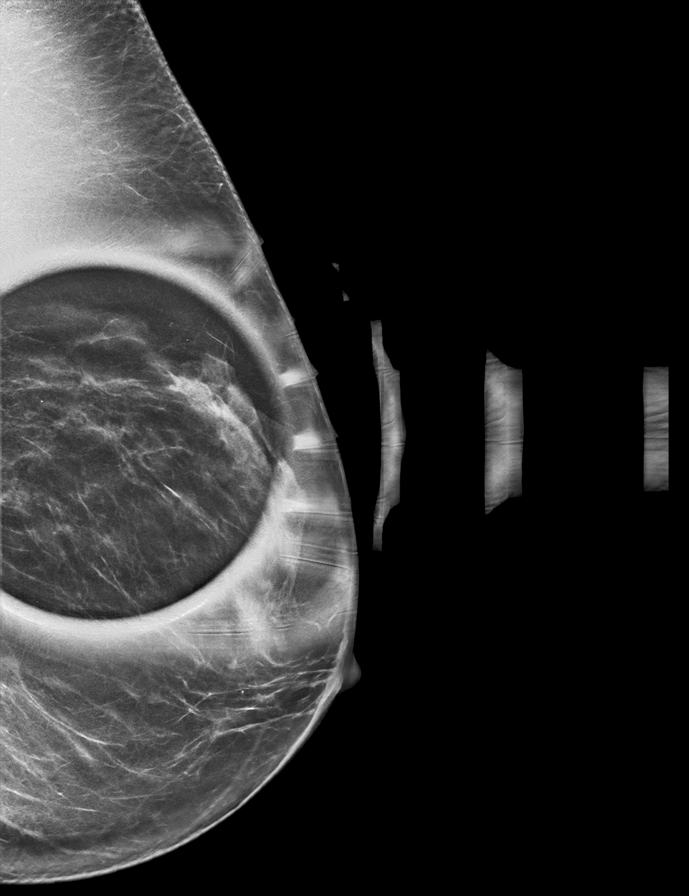

[R ML synth-2D]
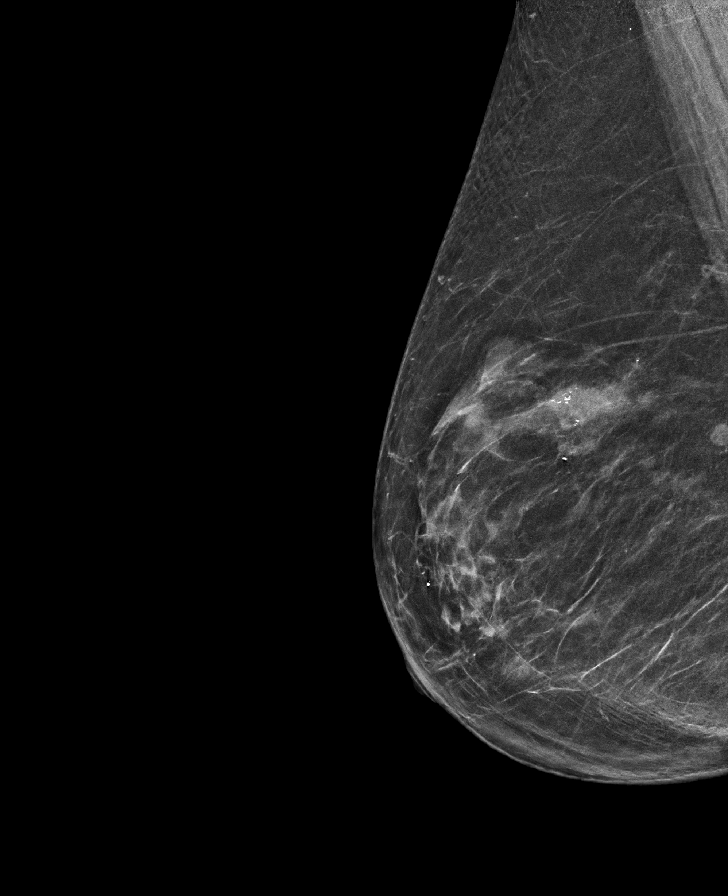

[L ML synth-2D]
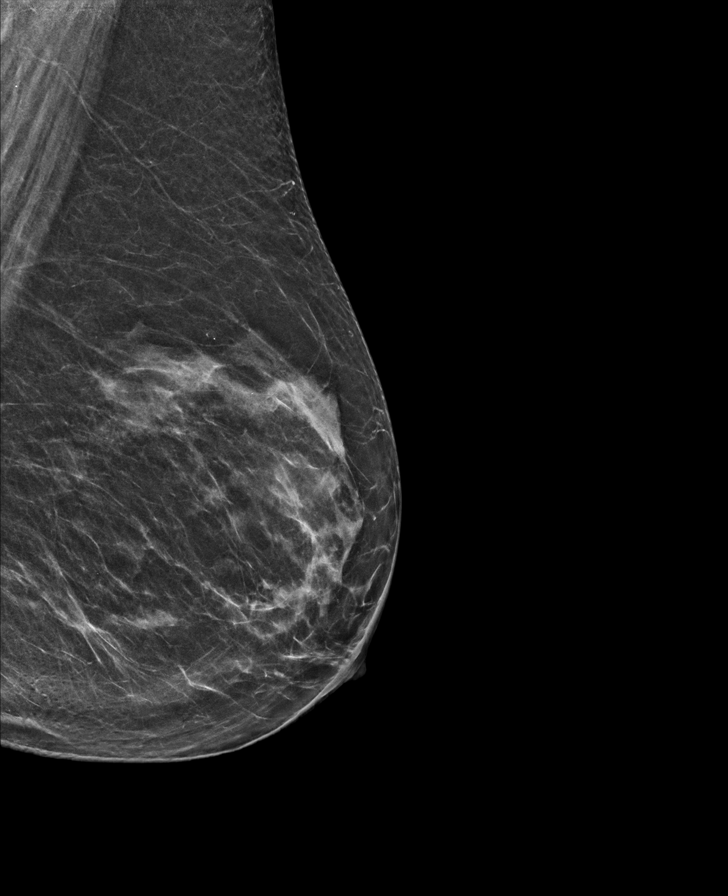

[L MLO tomo · tomo slice 31/60.0]
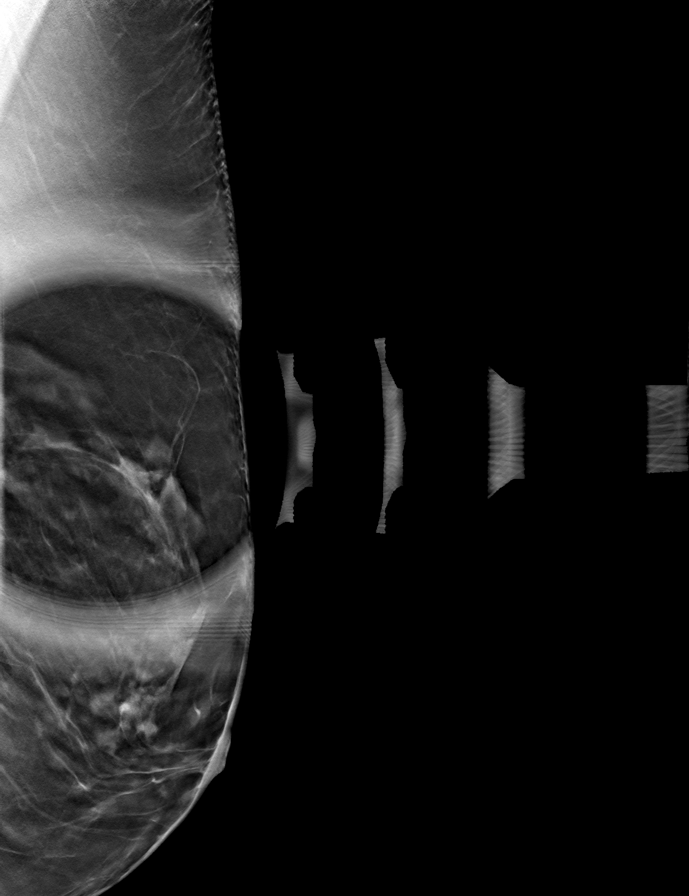

[8 of 27 positions shown; findings below may reference images not displayed]

ACR Breast Density Category b: There are scattered areas of
fibroglandular density.
FINDINGS: In the upper outer quadrant of the right breast there are developing
coarse heterogeneous calcifications spanning an area of 1 cm. 1.8 cm
posterior to this is a coarse calcifications measuring 0.14 mm. In
the 12 o'clock region of the breast there are developing similar
appearing coarse heterogeneous calcifications spanning an area of 3
mm.

Additional imaging of the left breast shows the fibroglandular
pattern is similar to prior exams. No suspicious mass or malignant
type microcalcifications identified.

Mammographic images were processed with CAD.

Targeted ultrasound is performed, showing normal tissue in the
upper-outer quadrant of the right breast. Sonographic evaluation the
right axilla does not show any enlarged adenopathy. Sonographic
evaluation of the superior aspect of the left breast shows normal
tissue. Sonographic evaluation the left axilla does not show any
enlarged adenopathy.
IMPRESSION: Indeterminate calcifications in the right breast.

RECOMMENDATION:
Stereotactic biopsies of the calcifications in the upper outer
quadrant and the 12 o'clock region of the right breast is
recommended. If the calcifications are benign short-term interval
follow-up right mammogram in 6 months of the additional
calcifications posteriorly in the upper outer quadrant is
recommended.

I have discussed the findings and recommendations with the patient.
If applicable, a reminder letter will be sent to the patient
regarding the next appointment.

BI-RADS CATEGORY  4: Suspicious.

## 2021-01-20 ENCOUNTER — Telehealth: Payer: PRIVATE HEALTH INSURANCE

## 2021-01-20 ENCOUNTER — Telehealth: Payer: Self-pay

## 2021-01-20 NOTE — Progress Notes (Deleted)
   Chronic Care Management Outreach Note  01/20/2021 Name:  SHERLY BRODBECK MRN:  [id number] DOB:  1950-12-24  Subjective: [name] is an 70 y.o. year old female who is a primary patient of Letvak, Theophilus Kinds, MD.  The CCM team was consulted for assistance with disease management and care coordination needs.    Engaged with patient by telephone for follow up visit in response to provider referral for pharmacy case management and/or care coordination services. Spoke with spouse. Patient still not feeling well and unable to complete appt today. See telephone note to triage. Rescheduled visit for Nov 2022.  Consent to Services:  The patient was given information about Chronic Care Management services, agreed to services, and gave verbal consent prior to initiation of services.  Please see initial visit note for detailed documentation.   Patient Care Team: Venia Carbon, MD as PCP - [name], [facility] as Pharmacist (Pharmacist) Deirdre Peer, LCSW as Social Worker Dannielle Karvonen, RN as [place] [facility]  Recent office visits: 09/09/20 - PCP - Pt presented for AWV. Consider famotidine OTC. Schedule mammogram.  Recent consult visits: None in previous 6 months  Hospital visits: 8/18 - ED - Patient presented for N/V. Left prior to evaluation.   Time spent in chart review prior to appt: 10 min. Follow Up: Nov 2022  Debbora Dus, PharmD Clinical Pharmacist [facility] at [facility] [phone]

## 2021-01-20 NOTE — Telephone Encounter (Signed)
  Chronic Care Management   Outreach Note  01/20/2021 Name: Jacqueline Cole MRN: [id number] DOB: September 20, 1950  Referred by: Venia Carbon, MD Reason for referral : CCM  An unsuccessful telephone outreach was attempted today. The patient was referred to the pharmacist for assistance with care management and care coordination.   Attempted to reach both contacts, left VM.  Follow Up Plan: CCM team reach out for rescheduling  Debbora Dus, PharmD Clinical Pharmacist [facility] [facility] at [facility] [phone]

## 2021-01-20 NOTE — Chronic Care Management (AMB) (Signed)
Patient was rescheduled to 02/16/21 with Debbora Dus for televisit.    Debbora Dus, CPP notified  Margaretmary Dys, [place] Pharmacy Assistant [phone]

## 2021-01-22 ENCOUNTER — Telehealth: Payer: Self-pay | Admitting: *Deleted

## 2021-01-22 NOTE — Telephone Encounter (Signed)
  Care Management   Follow Up Note   01/22/2021 Name: Jacqueline Cole MRN: [id number] DOB: June 08, 1950   Referred by: Venia Carbon, MD Reason for referral : No chief complaint on file.   A second unsuccessful telephone outreach was attempted today. The patient was referred to the case management team for assistance with care management and care coordination.   Follow Up Plan: The care management team will reach out to the patient again over the next 10 days.   Eduard Clos MSW, LCSW Licensed Clinical Social Worker [place] [phone]

## 2021-01-23 ENCOUNTER — Telehealth: Payer: Self-pay | Admitting: *Deleted

## 2021-01-23 NOTE — Chronic Care Management (AMB) (Signed)
  Care Management   Note  01/23/2021 Name: Jacqueline Cole MRN: [id number] DOB: January 17, 1951  Jacqueline Cole is a 70 y.o. year old female who is a primary care patient of Venia Carbon, MD and is actively engaged with the care management team. I reached out to Jacqueline Cole by phone today to assist with re-scheduling an initial visit with the Licensed Clinical Social Worker  Follow up plan: Unsuccessful telephone outreach attempt made. A HIPAA compliant phone message was left for the patient providing contact information and requesting a return call.  The care management team will reach out to the patient again over the next 7 days.  If patient returns call to provider office, please advise to call [place]  at [place] [facility]  Direct Dial: [phone]

## 2021-01-26 NOTE — Chronic Care Management (AMB) (Signed)
  Care Management   Note  01/26/2021 Name: [name] MRN: [id number] DOB: 10-Mar-1951  Jacqueline Cole is a 70 y.o. year old female who is a primary care patient of Venia Carbon, MD and is actively engaged with the care management team. I reached out to Jacqueline Cole by phone today to assist with re-scheduling an initial visit with the Licensed Clinical Social Worker  Follow up plan: A second unsuccessful telephone outreach attempt made. A HIPAA compliant phone message was left for the patient providing contact information and requesting a return call. The care management team will reach out to the patient again over the next 7 days. If patient returns call to provider office, please advise to call [place] at [phone].  [place] Management  Direct Dial: [phone]

## 2021-01-29 NOTE — Chronic Care Management (AMB) (Signed)
  Care Management   Note  01/29/2021 Name: NEVELYN MELLOTT MRN: [id number] DOB: 09/19/1950  [name] is a 70 y.o. year old female who is a primary care patient of Venia Carbon, MD and is actively engaged with the care management team. I reached out to [name] by phone today to assist with re-scheduling an initial visit with the Licensed Clinical Social Worker  Follow up plan: A third unsuccessful telephone outreach attempt made. A HIPAA compliant phone message was left for the patient providing contact information and requesting a return call. Unable to make contact on outreach attempts x 3. PCP Dr. Silvio Pate notified via routed documentation in medical record. We have been unable to make contact with the patient for follow up. The care management team is available to follow up with the patient after provider conversation with the patient regarding recommendation for care management engagement and subsequent re-referral to the care management team.   [place] Management  Direct Dial: [phone]

## 2021-01-30 ENCOUNTER — Telehealth: Payer: Self-pay | Admitting: *Deleted

## 2021-01-30 NOTE — Chronic Care Management (AMB) (Signed)
  Care Management   Note  01/30/2021 Name: [name] MRN: [id number] DOB: 1950/12/21  Jacqueline Cole is a 70 y.o. year old female who is a primary care patient of Venia Carbon, MD and is actively engaged with the care management team. I reached out to Jacqueline Cole by phone today to assist with re-scheduling an initial visit with the Licensed Clinical Social Worker  Follow up plan: Telephone appointment with care management team member scheduled for:02/26/21  [place] Management  Direct Dial: [phone]

## 2021-02-03 ENCOUNTER — Telehealth: Payer: Self-pay

## 2021-02-03 NOTE — Chronic Care Management (AMB) (Addendum)
    Chronic Care Management Pharmacy Assistant   Name: Jacqueline Cole  MRN: [id number] DOB: 1950/10/28   Reason for Encounter: Medication Adherence and Delivery Coordination   Recent office visits:  None sine last CCM contact  Recent consult visits:  None since last CCM contact  Hospital visits:  None in previous 6 months  Medications: Outpatient Encounter Medications as of 02/03/2021  Medication Sig   Blood Glucose Monitoring Suppl ([place]) w/Device KIT Use to check blood sugar 2 times a day.   gabapentin (NEURONTIN) 300 MG capsule Take 600 mg by mouth at bedtime.   glucose blood (ONETOUCH VERIO) test strip Use as instructed to check blood sugar 2 times a day.   hydrocortisone cream 1 % Apply 1 application topically daily as needed for itching.   insulin glargine (LANTUS) 100 UNIT/ML injection Inject 0.1-0.2 mLs (10-20 Units total) into the skin at bedtime. INJECT 25-50 UNITS SUBCUTANEOUSLY ONCE DAILY AT BEDTIME   Lancets (ONETOUCH ULTRASOFT) lancets Use as instructed to check blood sugar 2 times a day.   omeprazole (PRILOSEC) 20 MG capsule Take 1 capsule (20 mg total) by mouth 2 (two) times daily before a meal for 14 days.   Propylene Glycol (SYSTANE COMPLETE OP) Place 3 drops into both eyes 3 (three) times daily.   trolamine salicylate (ASPERCREME) 10 % cream Apply 1 application topically as needed for muscle pain. Apply to feet   venlafaxine XR (EFFEXOR-XR) 150 MG 24 hr capsule TAKE ONE CAPSULE BY MOUTH EVERY MORNING and TAKE ONE TABLET BY MOUTH EVERYDAY AT BEDTIME (Patient taking differently: Take 150 mg by mouth in the morning and at bedtime.)   vitamin B-12 (CYANOCOBALAMIN) 500 MCG tablet Take 500 mcg by mouth daily.   No facility-administered encounter medications on file as of 02/03/2021.   BP Readings from Last 3 Encounters:  01/07/21 118/80  12/10/20 102/68  11/20/20 118/80    Lab Results  Component Value Date   HGBA1C 5.5 11/16/2020      No  OVs, Consults, or hospital visits since last care coordination call / Pharmacist visit. No medication changes indicated   Last adherence delivery date:01/19/21      Patient is due for next adherence delivery on: 02/16/21  Multiple attempts made to reach patient. Unsuccessful outreach. Will refill based off of last adherence fill.   This delivery to include: Adherence Packaging  30 Days  Packs: Venlafaxine ER 150mg   take 1 tablet breakfast and 1 tablet bedtime Vitamin B12 532mcg take 1 tablet breakfast Vitamin D3 64mcg  take 1 tablet breakfast Gabapentin 300mg  take 2 tablets bedtime  VIAL medications: Lantus 100unit/ml -inject 25-50 units at bedtime  Annual wellness visit in last year? No Most Recent BP reading: 118/80  01/07/21  If Diabetic: Most recent A1C reading:5.5  11/16/20 Last eye exam / retinopathy screening: 2019 Last diabetic foot exam: up to date  Debbora Dus, CPP notified  [name], [place] Assistant [phone]  I have reviewed the care management and care coordination activities outlined in this encounter and I am certifying that I agree with the content of this note. No further action required.  Debbora Dus, PharmD Clinical Pharmacist [place] [facility] at [facility] [phone]

## 2021-02-12 ENCOUNTER — Telehealth: Payer: Self-pay

## 2021-02-12 NOTE — Chronic Care Management (AMB) (Signed)
    Chronic Care Management Pharmacy Assistant   Name: Jacqueline Cole  MRN: [id number] DOB: 10-25-1950   Reason for Encounter: Reminder Call   Conditions to be addressed/monitored: DMII and CKD Stage 3b  Medications: Outpatient Encounter Medications as of 02/12/2021  Medication Sig   Blood Glucose Monitoring Suppl (ONETOUCH VERIO FLEX SYSTEM) w/Device KIT Use to check blood sugar 2 times a day.   gabapentin (NEURONTIN) 300 MG capsule Take 600 mg by mouth at bedtime.   glucose blood (ONETOUCH VERIO) test strip Use as instructed to check blood sugar 2 times a day.   hydrocortisone cream 1 % Apply 1 application topically daily as needed for itching.   insulin glargine (LANTUS) 100 UNIT/ML injection Inject 0.1-0.2 mLs (10-20 Units total) into the skin at bedtime. INJECT 25-50 UNITS SUBCUTANEOUSLY ONCE DAILY AT BEDTIME   Lancets (ONETOUCH ULTRASOFT) lancets Use as instructed to check blood sugar 2 times a day.   omeprazole (PRILOSEC) 20 MG capsule Take 1 capsule (20 mg total) by mouth 2 (two) times daily before a meal for 14 days.   Propylene Glycol (SYSTANE COMPLETE OP) Place 3 drops into both eyes 3 (three) times daily.   trolamine salicylate (ASPERCREME) 10 % cream Apply 1 application topically as needed for muscle pain. Apply to feet   venlafaxine XR (EFFEXOR-XR) 150 MG 24 hr capsule TAKE ONE CAPSULE BY MOUTH EVERY MORNING and TAKE ONE TABLET BY MOUTH EVERYDAY AT BEDTIME (Patient taking differently: Take 150 mg by mouth in the morning and at bedtime.)   vitamin B-12 (CYANOCOBALAMIN) 500 MCG tablet Take 500 mcg by mouth daily.   No facility-administered encounter medications on file as of 02/12/2021.   [name]  did not answer the telephone  to remind her of her upcoming telephone visit with Debbora Dus on 02/16/21 at 4:00pm. Patient was reminded to have all medications, supplements and any blood glucose and blood pressure readings available for review at appointment.Detailed message  left on machine     Star Rating Drugs: Medication:  Last Fill: Day Supply Lantus   02/09/21 [place], CPP notified  [name], [place] Assistant [phone]  Total time spent for month CPA: 10 min.

## 2021-02-16 ENCOUNTER — Telehealth: Payer: PRIVATE HEALTH INSURANCE

## 2021-02-16 ENCOUNTER — Telehealth: Payer: Self-pay

## 2021-02-16 NOTE — Telephone Encounter (Signed)
Pt's husband returned call. Elverda was not available but she will be on Thursday. He will have her call me then.  Debbora Dus, PharmD Clinical Pharmacist Practitioner [place] [facility] at [facility] [phone]

## 2021-02-16 NOTE — Telephone Encounter (Signed)
  Chronic Care Management   Outreach Note  02/16/2021 Name: Jacqueline Cole MRN: [id number] DOB: 05-Apr-1950  Referred by: Venia Carbon, MD Reason for referral : Chronic Care Management (Missed CCM PharmD appt)  Unsuccessful outreach for CCM visit  02/16/21 at 4 PM X2. Please have patient contact me to discuss medications and health concerns.    Debbora Dus, PharmD Clinical Pharmacist Practitioner [place] [facility] at [facility] [phone]

## 2021-02-17 ENCOUNTER — Telehealth: Payer: Self-pay

## 2021-02-17 NOTE — Progress Notes (Signed)
    Chronic Care Management Pharmacy Assistant   Name: Jacqueline Cole  MRN: [id number] DOB: 10-02-1950   Reason for Encounter: CCM (Unsuccessful Delivery of Medications)   Patient's husband called me this morning. Stated his wife should have had a delivery of medications yesterday (02/16/2021) and it did not come. I spoke with the pharmacy; Medications were returned to pharmacy as the driver could not reach the patient at time of delivery. CSR is going to reach back out today and try to redeliver. Patient's husband also stated the card on file was compromised and he needs to update his card on file with the new one. Pharmacy will call patient to obtain new information. I called patient's husband back to inform him. Phone went straight to voicemail where I left a message. I reached out to patient's daughter [name]); no answer; left message asking her to call me back in regards to her Mom's medication delivery.   Debbora Dus, CPP notified  Marijean Niemann, [place] Clinical Pharmacy Assistant [phone]  Time Spent:  20 Minutes

## 2021-02-26 ENCOUNTER — Ambulatory Visit (INDEPENDENT_AMBULATORY_CARE_PROVIDER_SITE_OTHER): Payer: PRIVATE HEALTH INSURANCE | Admitting: *Deleted

## 2021-02-26 DIAGNOSIS — E1142 Type 2 diabetes mellitus with diabetic polyneuropathy: Secondary | ICD-10-CM

## 2021-02-26 DIAGNOSIS — F331 Major depressive disorder, recurrent, moderate: Secondary | ICD-10-CM
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
## 2021-02-26 NOTE — Progress Notes (Signed)
Has she confirmed taking it as prescribed? She missed 2 refills from [facility] so she was out of the medication from approximately 9/19-11/7.
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
## 2021-03-02 ENCOUNTER — Telehealth: Payer: Self-pay

## 2021-03-02 NOTE — Telephone Encounter (Signed)
-----   Message from Venia Carbon, MD sent at 03/01/2021  2:05 PM EST ----- If she is having problems with her depression now, set her up for an appt this week ----- Message ----- From: Pilar Grammes, CMA Sent: 02/27/2021   3:22 PM EST To: Debbora Dus, RPH, Venia Carbon, MD  Spoke to pt and spouse. Said she missed it for about a week or 2. She has been back on it daily since October.  ----- Message ----- From: Venia Carbon, MD Sent: 02/26/2021   4:57 PM EST To: Debbora Dus, RPH, Pilar Grammes, CMA  That could be the problem.  [place], Please make sure she is taking the medicine  ----- Message ----- From: Debbora Dus, [facility] Sent: 02/26/2021   4:20 PM EST To: Deirdre Peer, LCSW, Venia Carbon, MD     ----- Message ----- From: Deirdre Peer, [place] Sent: 02/26/2021  12:03 PM EST To: Debbora Dus, RPH, Venia Carbon, MD  Dr Silvio Pate and Sharyn Lull,  Significant depression; sleeps a lot, avoids calls/interactions- and history of significant loss and trauma- linking her with on-going psycho-therapy.  Pt reports being on Effexor for over 20 years- wonder if she might be better managed with some changes per your recommendations?  Eduard Clos MSW, LCSW Licensed Clinical Social Worker [place] [phone]

## 2021-03-02 NOTE — Telephone Encounter (Signed)
Left a message on VM per DPR that if her depression is worse to call the office and schedule an appt this week as Dr Silvio Pate is off next week. It would be 2 weeks before we could see her if not this week.

## 2021-03-04 ENCOUNTER — Other Ambulatory Visit: Payer: Self-pay | Admitting: Internal Medicine

## 2021-03-05 ENCOUNTER — Telehealth: Payer: Self-pay

## 2021-03-05 NOTE — Chronic Care Management (AMB) (Addendum)
° ° °  Chronic Care Management Pharmacy Assistant   Name: Jacqueline Cole  MRN: [id number] DOB: November 26, 1950   Reason for Encounter: Medication Adherence and Delivery Coordination    Recent office visits:  02/26/21 - CCM, Eduard Clos, Social worker - Initial consult, worsening depression, consulted PCP.  Recent consult visits:  None since last CCM contact  Hospital visits:  None in previous 6 months  Medications: Outpatient Encounter Medications as of 03/05/2021  Medication Sig   Blood Glucose Monitoring Suppl (ONETOUCH VERIO FLEX SYSTEM) w/Device KIT Use to check blood sugar 2 times a day.   gabapentin (NEURONTIN) 300 MG capsule Take 600 mg by mouth at bedtime.   glucose blood (ONETOUCH VERIO) test strip Use as instructed to check blood sugar 2 times a day.   hydrocortisone cream 1 % Apply 1 application topically daily as needed for itching.   insulin glargine (LANTUS) 100 UNIT/ML injection Inject 0.1-0.2 mLs (10-20 Units total) into the skin at bedtime. INJECT 25-50 UNITS SUBCUTANEOUSLY ONCE DAILY AT BEDTIME   Lancets (ONETOUCH ULTRASOFT) lancets Use as instructed to check blood sugar 2 times a day.   omeprazole (PRILOSEC) 20 MG capsule Take 1 capsule (20 mg total) by mouth 2 (two) times daily before a meal for 14 days.   Propylene Glycol (SYSTANE COMPLETE OP) Place 3 drops into both eyes 3 (three) times daily.   trolamine salicylate (ASPERCREME) 10 % cream Apply 1 application topically as needed for muscle pain. Apply to feet   venlafaxine XR (EFFEXOR-XR) 150 MG 24 hr capsule TAKE ONE TABLET BY MOUTH EVERY MORNING and TAKE ONE TABLET EVERYDAY AT BEDTIME   vitamin B-12 (CYANOCOBALAMIN) 500 MCG tablet Take 500 mcg by mouth daily.   No facility-administered encounter medications on file as of 03/05/2021.   BP Readings from Last 3 Encounters:  01/07/21 118/80  12/10/20 102/68  11/20/20 118/80    Lab Results  Component Value Date   HGBA1C 5.5 11/16/2020      No OVs, Consults,  or hospital visits since last care coordination call / Pharmacist visit. No medication changes indicated   Last adherence delivery date:02/16/21      Patient is due for next adherence delivery on: 03/17/2021  Multiple attempts made to reach patient. Unsuccessful outreach. Will refill based off of last adherence fill.   This delivery to include: Adherence Packaging  30 Days  Packs: Venlafaxine ER 150mg   take 1 tablet breakfast and 1 tablet bedtime Vitamin B12 589mcg take 1 tablet breakfast Vitamin D3 109mcg  take 1 tablet breakfast Gabapentin 300mg  take 2 tablets bedtime   VIAL medications: none  Patient declined the following medications this month: Unavailable to verify  Refills requested from providers include: Venlafaxine  Delivery scheduled for 03/17/2021. Unable to speak with patient to confirm date.   Annual wellness visit in last year? No Most Recent BP reading:118/80  01/07/21  If Diabetic: Most recent A1C reading:  5.5 11/16/20 Last eye exam / retinopathy screening:  2019 Last diabetic foot exam: UTD  Debbora Dus, CPP notified  [name], [place] Assistant [phone]  I have reviewed the care management and care coordination activities outlined in this encounter and I am certifying that I agree with the content of this note. No further action required.  Debbora Dus, PharmD Clinical Pharmacist [facility] at [facility] [phone]

## 2021-03-14 DIAGNOSIS — F331 Major depressive disorder, recurrent, moderate: Secondary | ICD-10-CM

## 2021-03-14 DIAGNOSIS — E1142 Type 2 diabetes mellitus with diabetic polyneuropathy: Secondary | ICD-10-CM

## 2021-03-14 DIAGNOSIS — Z794 Long term (current) use of insulin: Secondary | ICD-10-CM

## 2021-03-23 ENCOUNTER — Telehealth: Payer: PRIVATE HEALTH INSURANCE

## 2021-03-23 ENCOUNTER — Telehealth: Payer: Self-pay | Admitting: *Deleted

## 2021-03-23 NOTE — Telephone Encounter (Signed)
°  Care Management   Follow Up Note   03/23/2021 Name: Jacqueline Cole MRN: [id number] DOB: [birth year]   Referred by: Venia Carbon, MD Reason for referral : No chief complaint on file.   An unsuccessful telephone outreach was attempted today. The patient was referred to the case management team for assistance with care management and care coordination.   Follow Up Plan: Telephone follow up appointment with care management team member scheduled for: 5-7 days if no return call is received.  Eduard Clos MSW, LCSW Licensed Clinical Social Worker [place] [phone]

## 2021-03-27 ENCOUNTER — Telehealth: Payer: PRIVATE HEALTH INSURANCE

## 2021-04-06 ENCOUNTER — Telehealth: Payer: Self-pay

## 2021-04-06 NOTE — Chronic Care Management (AMB) (Addendum)
° ° °  Chronic Care Management Pharmacy Assistant   Name: Jacqueline Cole  MRN: [id number] DOB: [birth year]  Reason for Encounter: Medication Adherence and Delivery Coordination  Recent office visits:  None since last CCM contact  Recent consult visits:  None since last CCM contact  Hospital visits:  None in previous 6 months  Medications: Outpatient Encounter Medications as of 04/06/2021  Medication Sig   Blood Glucose Monitoring Suppl ([place]) w/Device KIT Use to check blood sugar 2 times a day.   gabapentin (NEURONTIN) 300 MG capsule Take 600 mg by mouth at bedtime.   glucose blood (ONETOUCH VERIO) test strip Use as instructed to check blood sugar 2 times a day.   hydrocortisone cream 1 % Apply 1 application topically daily as needed for itching.   insulin glargine (LANTUS) 100 UNIT/ML injection Inject 0.1-0.2 mLs (10-20 Units total) into the skin at bedtime. INJECT 25-50 UNITS SUBCUTANEOUSLY ONCE DAILY AT BEDTIME   Lancets (ONETOUCH ULTRASOFT) lancets Use as instructed to check blood sugar 2 times a day.   omeprazole (PRILOSEC) 20 MG capsule Take 1 capsule (20 mg total) by mouth 2 (two) times daily before a meal for 14 days.   Propylene Glycol (SYSTANE COMPLETE OP) Place 3 drops into both eyes 3 (three) times daily.   trolamine salicylate (ASPERCREME) 10 % cream Apply 1 application topically as needed for muscle pain. Apply to feet   venlafaxine XR (EFFEXOR-XR) 150 MG 24 hr capsule TAKE ONE TABLET BY MOUTH EVERY MORNING and TAKE ONE TABLET EVERYDAY AT BEDTIME   vitamin B-12 (CYANOCOBALAMIN) 500 MCG tablet Take 500 mcg by mouth daily.   No facility-administered encounter medications on file as of 04/06/2021.   BP Readings from Last 3 Encounters:  01/07/21 118/80  12/10/20 102/68  11/20/20 118/80    Lab Results  Component Value Date   HGBA1C 5.5 11/16/2020    No OVs, Consults, or hospital visits since last care coordination call / Pharmacist visit. No  medication changes indicated  Last adherence delivery date: 03/17/21      Patient is due for next adherence delivery on: 04/16/21  Multiple attempts made to reach patient. Unsuccessful outreach. Will refill based off of last adherence fill.   This delivery to include: Adherence Packaging  30 Days  Packs: Venlafaxine ER 150mg   take 1 tablet breakfast and 1 tablet bedtime Vitamin B12 529mcg take 1 tablet breakfast Vitamin D3 81mcg  take 1 tablet breakfast Gabapentin 300mg  take 2 tablets bedtime  VIAL medications: none  Patient declined the following medications this month Not available to review  No refill request needed.  Delivery scheduled for 04/16/21. Unable to speak with patient to confirm date.   Annual wellness visit in last year? No Most Recent BP reading: 118/80  01/07/21  If Diabetic: Most recent A1C reading: 5.5   11/16/20 Last eye exam / retinopathy screening: 2019 Last diabetic foot exam: UTD   Debbora Dus, CPP notified  [name], [place] Assistant [phone]  I have reviewed the care management and care coordination activities outlined in this encounter and I am certifying that I agree with the content of this note. No further action required.  Debbora Dus, PharmD Clinical Pharmacist [facility] at [facility] [phone]

## 2021-04-09 ENCOUNTER — Other Ambulatory Visit: Payer: Self-pay | Admitting: Internal Medicine

## 2021-04-10 ENCOUNTER — Encounter: Payer: Self-pay | Admitting: Internal Medicine

## 2021-04-10 ENCOUNTER — Ambulatory Visit (INDEPENDENT_AMBULATORY_CARE_PROVIDER_SITE_OTHER): Payer: PPO | Admitting: Internal Medicine

## 2021-04-10 ENCOUNTER — Other Ambulatory Visit: Payer: Self-pay

## 2021-04-10 VITALS — BP 110/60 | HR 66 | Temp 97.5°F | Ht 65.0 in | Wt 199.0 lb

## 2021-04-10 DIAGNOSIS — E1142 Type 2 diabetes mellitus with diabetic polyneuropathy: Secondary | ICD-10-CM

## 2021-04-10 LAB — POCT GLYCOSYLATED HEMOGLOBIN (HGB A1C): Hemoglobin A1C: 9.1 % — AB (ref 4.0–5.6)

## 2021-04-10 MED ORDER — INSULIN GLARGINE 100 UNIT/ML ~~LOC~~ SOLN: 30.0000 [IU] | Freq: Every day | SUBCUTANEOUS | 0 refills | Status: DC

## 2021-04-10 MED ORDER — OMEPRAZOLE 20 MG PO CPDR: 20.0000 mg | DELAYED_RELEASE_CAPSULE | Freq: Two times a day (BID) | ORAL | 3 refills | Status: DC

## 2021-04-10 NOTE — Assessment & Plan Note (Signed)
Lab Results  Component Value Date   HGBA1C 9.1 (A) 04/10/2021   Now out of control Some of this may be from her recent illness Discussed that I don't want her adjusting the lantus day to day Will go up to 30 units daily---and slowly increase if AM sugars are over 140 Recheck 3 months---if not under 8%, will add glipizide 2.5 or 5mg  daily

## 2021-04-10 NOTE — Progress Notes (Signed)
Subjective:    Patient ID: Jacqueline Cole, female    DOB: 06-Jun-1950, 71 y.o.   MRN: [id number]  HPI Here for follow up of diabetes With husband  Feels "a lot better" Has gained back 20# or so Had flu recently---frustrated that this happens despite the flu vaccine (but wasn't tested) Sugar up during that time--lots of cough syrup  Taking 20 units of a day of the lantus in the morning (occ 25) Varies based on her sugars Holds the second dose if under 120 in the morning No low sugar reactions Eats fairly healthy  Current Outpatient Medications on File Prior to Visit  Medication Sig Dispense Refill   Blood Glucose Monitoring Suppl (ONETOUCH VERIO FLEX SYSTEM) w/Device KIT Use to check blood sugar 2 times a day. 1 kit 0   gabapentin (NEURONTIN) 300 MG capsule Take 600 mg by mouth at bedtime.     glucose blood (ONETOUCH VERIO) test strip Use as instructed to check blood sugar 2 times a day. 200 each 12   hydrocortisone cream 1 % Apply 1 application topically daily as needed for itching.     insulin glargine (LANTUS) 100 UNIT/ML injection Inject 0.1-0.2 mLs (10-20 Units total) into the skin at bedtime. INJECT 25-50 UNITS SUBCUTANEOUSLY ONCE DAILY AT BEDTIME (Patient taking differently: Inject 20 Units into the skin 2 (two) times daily. INJECT 25-50 UNITS SUBCUTANEOUSLY ONCE DAILY AT BEDTIME) 10 mL 5   Lancets (ONETOUCH ULTRASOFT) lancets Use as instructed to check blood sugar 2 times a day. 200 each 12   Propylene Glycol (SYSTANE COMPLETE OP) Place 3 drops into both eyes 3 (three) times daily.     trolamine salicylate (ASPERCREME) 10 % cream Apply 1 application topically as needed for muscle pain. Apply to feet     venlafaxine XR (EFFEXOR-XR) 150 MG 24 hr capsule TAKE ONE TABLET BY MOUTH EVERY MORNING and TAKE ONE TABLET EVERYDAY AT BEDTIME 180 capsule 0   vitamin B-12 (CYANOCOBALAMIN) 500 MCG tablet Take 500 mcg by mouth daily.     omeprazole (PRILOSEC) 20 MG capsule Take 1 capsule (20 mg  total) by mouth 2 (two) times daily before a meal for 14 days. 90 capsule 3   No current facility-administered medications on file prior to visit.    Allergies  Allergen Reactions   Onglyza [Saxagliptin] Hives and Nausea And Vomiting   Liraglutide Nausea Only    Can't eat, nausea, higher sugars    Past Medical History:  Diagnosis Date   Depression    Diabetes mellitus type II    Hyperlipemia    Major depressive disorder, recurrent, in remission ([place]) 09/01/2006   Qualifier: Diagnosis of  By: Lelon Mast     Obesity    Osteopenia    Syncope 1998   DM diagnosis    Past Surgical History:  Procedure Laterality Date   ABDOMINAL HYSTERECTOMY  ~2005   and BSO   BREAST BIOPSY Right 01/05/2019   rt stereo bx 2 areas 1st area ribbon clip   BREAST BIOPSY Right 01/05/2019   rt stereo bx 2nd area coil clip   CHOLECYSTECTOMY  1975   COSMETIC SURGERY     Injury Right face as a child   ESOPHAGOGASTRODUODENOSCOPY (EGD) WITH PROPOFOL N/A 11/18/2020   Procedure: ESOPHAGOGASTRODUODENOSCOPY (EGD) WITH PROPOFOL;  Surgeon: Jonathon Bellows, MD;  [facility] ENDOSCOPY;  Service: Gastroenterology;  Laterality: N/A;   VAGINAL DELIVERY     X 2    Family History  Problem Relation Age of  Onset   Stroke Father        CVA   Heart disease Father        MI   Cancer Sister        breast cancer   Breast cancer Sister 78   Heart disease Brother        MI   Cancer Brother        Lung    Social History   Socioeconomic History   Marital status: Married    Spouse name: Not on file   Number of children: 2   Years of education: Not on file   Highest education level: Not on file  Occupational History    Comment: "Just up and quit" at [employer] Use   Smoking status: Former    Types: Cigarettes    Quit date: 03/15/1993    Years since quitting: 28.0   Smokeless tobacco: Never  Vaping Use   Vaping Use: Never used  Substance and Sexual Activity   Alcohol use: No    Alcohol/week: 0.0  standard drinks   Drug use: No   Sexual activity: Not on file  Other Topics Concern   Not on file  Social History Narrative   Has living will   Husband, then daughter [name], have health care POA   Would reluctantly accept resuscitation but no life prolonging measures   Social Determinants of Health   Financial Resource Strain: Not on file  Food Insecurity: Not on file  Transportation Needs: No Transportation Needs   Lack of Transportation (Medical): No   Lack of Transportation (Non-Medical): No  Physical Activity: Not on file  Stress: Not on file  Social Connections: Not on file  Intimate Partner Violence: Not on file   Review of Systems No chest pain Some breathing problems with the flu Sleep is variable    Objective:   Physical Exam Constitutional:      Appearance: Normal appearance.  Cardiovascular:     Rate and Rhythm: Normal rate and regular rhythm.     Heart sounds: No murmur heard.   No gallop.  Pulmonary:     Effort: Pulmonary effort is normal.     Breath sounds: Normal breath sounds. No wheezing or rales.  Musculoskeletal:     Cervical back: Neck supple.     Right lower leg: No edema.     Left lower leg: No edema.  Lymphadenopathy:     Cervical: No cervical adenopathy.  Neurological:     Mental Status: She is alert.           Assessment & Plan:

## 2021-04-10 NOTE — Patient Instructions (Signed)
Please change the lantus to 30 units every day. Check AM fasting sugars----if they are over 140, we can slowly increase the lantus (send me an email)

## 2021-05-06 ENCOUNTER — Telehealth: Payer: Self-pay

## 2021-05-06 NOTE — Chronic Care Management (AMB) (Addendum)
° ° °  Chronic Care Management °Pharmacy Assistant  ° °Name: Jacqueline Cole  MRN: [id number] DOB: 06/12/1950 ° °Reason for Encounter: Medication Adherence and Delivery Coordination ° °Recent office visits:  °04/10/21-PCP-[name],MD-Patient presented for follow up diabetes. New A1c 9.1. Increase the lantus to 30 units every day. check AM fasting sugars----if they are over 140, we can slowly increase the lantus -Recheck 3 months---if not under 8%, will add glipizide 2.5 or 5mg daily ° °Recent consult visits:  °None since last CCM contact ° °Hospital visits:  °None in previous 6 months ° °Medications: °Outpatient Encounter Medications as of 05/06/2021  °Medication Sig  ° Blood Glucose Monitoring Suppl (ONETOUCH VERIO FLEX SYSTEM) w/Device KIT Use to check blood sugar 2 times a day.  ° gabapentin (NEURONTIN) 300 MG capsule TAKE TWO CAPSULES BY MOUTH EVERYDAY AT BEDTIME  ° glucose blood (ONETOUCH VERIO) test strip Use as instructed to check blood sugar 2 times a day.  ° hydrocortisone cream 1 % Apply 1 application topically daily as needed for itching.  ° insulin glargine (LANTUS) 100 UNIT/ML injection Inject 0.3 mLs (30 Units total) into the skin daily. INJECT 25-50 UNITS SUBCUTANEOUSLY ONCE DAILY AT BEDTIME  ° Lancets (ONETOUCH ULTRASOFT) lancets Use as instructed to check blood sugar 2 times a day.  ° omeprazole (PRILOSEC) 20 MG capsule Take 1 capsule (20 mg total) by mouth 2 (two) times daily before a meal.  ° Propylene Glycol (SYSTANE COMPLETE OP) Place 3 drops into both eyes 3 (three) times daily.  ° trolamine salicylate (ASPERCREME) 10 % cream Apply 1 application topically as needed for muscle pain. Apply to feet  ° venlafaxine XR (EFFEXOR-XR) 150 MG 24 hr capsule TAKE ONE TABLET BY MOUTH EVERY MORNING and TAKE ONE TABLET EVERYDAY AT BEDTIME  ° vitamin B-12 (CYANOCOBALAMIN) 500 MCG tablet Take 500 mcg by mouth daily.  ° °No facility-administered encounter medications on file as of 05/06/2021.  ° °BP Readings from  Last 3 Encounters:  °04/10/21 110/60  °01/07/21 118/80  °12/10/20 102/68  °  °Lab Results  °Component Value Date  ° HGBA1C 9.1 (A) 04/10/2021  °  ° ° °Recent OV, Consult or Hospital visit:  °Recent medication changes indicated:  ° ° °Last adherence delivery date:04/16/21     ° °Patient is due for next adherence delivery on: 05/15/21 ° °Spoke with patient on 05/07/21 reviewed medications and coordinated delivery.  Patients husband returned call and stated No Lantus was needed at this delivery  ° °This delivery to include: Adherence Packaging  30 Days  °Packs: °Venlafaxine ER 150mg  take 1 tablet breakfast and 1 tablet bedtime °Vitamin B12 500mcg take 1 tablet breakfast °Vitamin D3 25mcg  take 1 tablet breakfast °Gabapentin 300mg take 2 tablets bedtime °Omeprazole 20mg  take 1 tablet breakfast,1 tablet evening meal ° °VIAL medications:  none  Husband Keith returned call to decline refill on Lantus at this delivery ° °Is patient in packaging Yes ° °No refill request needed. ° °Confirmed delivery date of 05/15/21, advised patient that pharmacy will contact them the morning of delivery. ° ° ° °Annual wellness visit in last year? Yes °Most Recent BP reading:110/60  66-P 04/10/21 ° °If Diabetic: °Most recent A1C reading: 9.1  04/10/21 °Last eye exam / retinopathy screening:2019 °Last diabetic foot exam:09/09/20 ° °Lindsey Foltanski,RPH  CPP notified ° ° , CCMA °Clincal Pharmacy Assistant °[phone] ° °Total time spent for month °CPA: 40  min °

## 2021-06-03 ENCOUNTER — Other Ambulatory Visit: Payer: Self-pay | Admitting: Internal Medicine

## 2021-06-09 ENCOUNTER — Telehealth: Payer: Self-pay

## 2021-06-09 NOTE — Progress Notes (Signed)
? ? ?  Chronic Care Management ?Pharmacy Assistant  ? ?Name: Jacqueline Cole  MRN: [id number] DOB: [birth year] ? ?Reason for Encounter: CCM (Medication Adherence & Delivery Coordination) ?  ?Recent office visits:  ?None since last CCM contact ? ?Recent consult visits:  ?None since last CCM contact ? ?Hospital visits:  ?None in previous 6 months ? ?Medications: ?Outpatient Encounter Medications as of 06/09/2021  ?Medication Sig  ? Blood Glucose Monitoring Suppl ([place]) w/Device KIT Use to check blood sugar 2 times a day.  ? gabapentin (NEURONTIN) 300 MG capsule TAKE TWO CAPSULES BY MOUTH EVERYDAY AT BEDTIME  ? glucose blood (ONETOUCH VERIO) test strip Use as instructed to check blood sugar 2 times a day.  ? hydrocortisone cream 1 % Apply 1 application topically daily as needed for itching.  ? insulin glargine (LANTUS) 100 UNIT/ML injection Inject 0.3 mLs (30 Units total) into the skin daily. INJECT 25-50 UNITS SUBCUTANEOUSLY ONCE DAILY AT BEDTIME  ? Lancets (ONETOUCH ULTRASOFT) lancets Use as instructed to check blood sugar 2 times a day.  ? omeprazole (PRILOSEC) 20 MG capsule Take 1 capsule (20 mg total) by mouth 2 (two) times daily before a meal.  ? Propylene Glycol (SYSTANE COMPLETE OP) Place 3 drops into both eyes 3 (three) times daily.  ? trolamine salicylate (ASPERCREME) 10 % cream Apply 1 application topically as needed for muscle pain. Apply to feet  ? venlafaxine XR (EFFEXOR-XR) 150 MG 24 hr capsule TAKE ONE CAPSULE BY MOUTH EVERY MORNING and TAKE ONE CAPSULE BY MOUTH EVERYDAY AT BEDTIME  ? vitamin B-12 (CYANOCOBALAMIN) 500 MCG tablet Take 500 mcg by mouth daily.  ? ?No facility-administered encounter medications on file as of 06/09/2021.  ? ?BP Readings from Last 3 Encounters:  ?04/10/21 110/60  ?01/07/21 118/80  ?12/10/20 102/68  ?  ?Lab Results  ?Component Value Date  ? HGBA1C 9.1 (A) 04/10/2021  ?  ?No OVs, Consults, or hospital visits since last care coordination call / Pharmacist  visit. ?No medication changes indicated ? ?Last adherence delivery date: 05/15/2021     ? ?Patient is due for next adherence delivery on: 06/16/2021 ? ?Multiple attempts made to reach patient. Unsuccessful outreach. Will refill based off of last adherence fill.  ? ?This delivery to include: Adherence Packaging  30 Days  ?Packs: ?Venlafaxine ER 169m  take 1 tablet breakfast and 1 tablet bedtime ?Gabapentin 30109mtake 2 tablets bedtime ?Vitamin D3 2552m take 1 tablet breakfast ?Vitamin B12 500m15make 1 tablet breakfast ?Omeprazole 20mg39mke 1 tablet breakfast,1 tablet evening meal ?  ?VIAL medications:   ?Lantus 100 Units - Inject 25-30 Units once daily at bedtime.  ? ?Refills requested from providers include: ?Venlafaxine ER 150mg 46me 1 tablet breakfast and 1 tablet bedtime ? ?Delivery scheduled for 06/16/2021. Unable to speak with patient to confirm date.  ? ?Annual wellness visit in last year? Yes 09/09/2020 ?Most Recent BP reading: 110/60 on 04/10/2021 ? ?If Diabetic: ?Most recent A1C reading: 9.1 on 04/10/2021 ?Last eye exam / retinopathy screening: 04/25/2017 ?Last diabetic foot exam: Up to date ? ?LindseCharlene Brookenotified ? ?Gabi Mcfate MoMarijean Niemann?Clinical Pharmacy Assistant ?[phone] ? ?

## 2021-07-03 ENCOUNTER — Telehealth: Payer: Self-pay

## 2021-07-03 NOTE — Chronic Care Management (AMB) (Signed)
? ? ?Chronic Care Management ?Pharmacy Assistant  ? ?Name: Jacqueline Cole  MRN: [id number] DOB: 09/21/1950 ? ?Reason for Encounter: Medication Adherence and Delivery Coordination ?  ? ?Recent office visits:  ?None since last CCM contact ? ?Recent consult visits:  ?None since last CCM contact ? ?Hospital visits:  ?None in previous 6 months ? ?Medications: ?Outpatient Encounter Medications as of 07/03/2021  ?Medication Sig  ? Blood Glucose Monitoring Suppl ([place]) w/Device KIT Use to check blood sugar 2 times a day.  ? gabapentin (NEURONTIN) 300 MG capsule TAKE TWO CAPSULES BY MOUTH EVERYDAY AT BEDTIME  ? glucose blood (ONETOUCH VERIO) test strip Use as instructed to check blood sugar 2 times a day.  ? hydrocortisone cream 1 % Apply 1 application topically daily as needed for itching.  ? insulin glargine (LANTUS) 100 UNIT/ML injection Inject 0.3 mLs (30 Units total) into the skin daily. INJECT 25-50 UNITS SUBCUTANEOUSLY ONCE DAILY AT BEDTIME  ? Lancets (ONETOUCH ULTRASOFT) lancets Use as instructed to check blood sugar 2 times a day.  ? omeprazole (PRILOSEC) 20 MG capsule Take 1 capsule (20 mg total) by mouth 2 (two) times daily before a meal.  ? Propylene Glycol (SYSTANE COMPLETE OP) Place 3 drops into both eyes 3 (three) times daily.  ? trolamine salicylate (ASPERCREME) 10 % cream Apply 1 application topically as needed for muscle pain. Apply to feet  ? venlafaxine XR (EFFEXOR-XR) 150 MG 24 hr capsule TAKE ONE CAPSULE BY MOUTH EVERY MORNING and TAKE ONE CAPSULE BY MOUTH EVERYDAY AT BEDTIME  ? vitamin B-12 (CYANOCOBALAMIN) 500 MCG tablet Take 500 mcg by mouth daily.  ? ?No facility-administered encounter medications on file as of 07/03/2021.  ? ?BP Readings from Last 3 Encounters:  ?04/10/21 110/60  ?01/07/21 118/80  ?12/10/20 102/68  ?  ?Lab Results  ?Component Value Date  ? HGBA1C 9.1 (A) 04/10/2021  ?  ? ? ?No OVs, Consults, or hospital visits since last care coordination call / Pharmacist  visit. ?No medication changes indicated ? ? ?Last adherence delivery date:06/16/21     ? ?Patient is due for next adherence delivery on: 07/15/21 ? ?Spoke with patient on 07/07/21 reviewed medications and coordinated delivery. Spoke with husband. ? ?This delivery to include: Adherence Packaging  30 Days  ?Packs: ?Venlafaxine ER 150mg   take 1 tablet breakfast and 1 tablet bedtime ?Gabapentin 300mg  take 2 tablets bedtime ?Vitamin D3 36mcg  take 1 tablet breakfast ?Vitamin B12 571mcg take 1 tablet breakfast ?Omeprazole 20mg   take 1 tablet breakfast,1 tablet evening meal ? ?VIAL medications: ?none ? ? ?Patient declined the following medications this month: ?Lantus-patient has supply on hand  ? ? ?Any concerns about your medications? No ? ?How often do you forget or accidentally miss a dose? Rarely ? ?Do you use a pillbox? No ? ?Is patient in packaging Yes ? What is the date on your next pill pack? Not at home to read package date  ? Any concerns or issues with your packaging? No concerns at this time  ? ? ?No refill request needed. ? ?Confirmed delivery date of 07/15/21, advised patient that pharmacy will contact them the morning of delivery. ? ?Recent blood pressure readings are as follows: none available  ? ? ?Recent blood glucose readings are as follows: none available  ? ? ?Annual wellness visit in last year? Yes ?Most Recent BP reading:110/60 ? ?If Diabetic: ?Most recent A1C reading:9.1 ?Last eye exam / retinopathy screening:2019 ?Last diabetic foot exam: UTD ? ?Mendel Ryder  Foltanski, [place] notified ? ?Axcel Horsch, CCMA ?Health concierge  ?[phone]  ?

## 2021-07-10 ENCOUNTER — Ambulatory Visit (INDEPENDENT_AMBULATORY_CARE_PROVIDER_SITE_OTHER): Payer: PPO | Admitting: Internal Medicine

## 2021-07-10 ENCOUNTER — Encounter: Payer: Self-pay | Admitting: Internal Medicine

## 2021-07-10 VITALS — BP 136/76 | HR 75 | Temp 97.4°F | Ht 65.0 in | Wt 218.0 lb

## 2021-07-10 DIAGNOSIS — R413 Other amnesia: Secondary | ICD-10-CM

## 2021-07-10 DIAGNOSIS — E1142 Type 2 diabetes mellitus with diabetic polyneuropathy: Secondary | ICD-10-CM | POA: Diagnosis not present

## 2021-07-10 DIAGNOSIS — D329 Benign neoplasm of meninges, unspecified: Secondary | ICD-10-CM | POA: Diagnosis not present

## 2021-07-10 DIAGNOSIS — F331 Major depressive disorder, recurrent, moderate: Secondary | ICD-10-CM

## 2021-07-10 LAB — POCT GLYCOSYLATED HEMOGLOBIN (HGB A1C): Hemoglobin A1C: 8.4 % — AB (ref 4.0–5.6)

## 2021-07-10 MED ORDER — EMPAGLIFLOZIN 10 MG PO TABS: 10.0000 mg | ORAL_TABLET | Freq: Every day | ORAL | 3 refills | Status: DC

## 2021-07-10 NOTE — Assessment & Plan Note (Signed)
Lab Results  ?Component Value Date  ? HGBA1C 8.4 (A) 07/10/2021  ? ?Better but still not acceptable ?Discussed that glipizide is not the right metabolic choice ?Will add jardiance '10mg'$  daily--discussed increased voiding, etc ?

## 2021-07-10 NOTE — Assessment & Plan Note (Signed)
Mood has been okay on the bupropion 150 bid ?

## 2021-07-10 NOTE — Progress Notes (Signed)
? ?Subjective:  ? ? Patient ID: Jacqueline Cole, female    DOB: Oct 03, 1950, 71 y.o.   MRN: [id number] ? ?HPI ?Here for follow up of poorly controlled diabetes ?With husband ? ?Checks her sugar daily--180-200 ?Then got "meals on track" since after Christmas ?Now taking 30 units of insulin ? ?Has gained back almost 20# ? ?Mood is okay ?Gets some "brain fog"----confused at times ?Husband agrees this is worse lately ?They wonder about the meningioma causing some of this ?Husband really "babies her" since she got so sick--she has improved from her worst but still doesn't have her strength back (and not able to walk like she used to) ? ? ?Current Outpatient Medications on File Prior to Visit  ?Medication Sig Dispense Refill  ? Blood Glucose Monitoring Suppl ([place]) w/Device KIT Use to check blood sugar 2 times a day. 1 kit 0  ? gabapentin (NEURONTIN) 300 MG capsule TAKE TWO CAPSULES BY MOUTH EVERYDAY AT BEDTIME 180 capsule 3  ? glucose blood (ONETOUCH VERIO) test strip Use as instructed to check blood sugar 2 times a day. 200 each 12  ? hydrocortisone cream 1 % Apply 1 application topically daily as needed for itching.    ? insulin glargine (LANTUS) 100 UNIT/ML injection Inject 0.3 mLs (30 Units total) into the skin daily. INJECT 25-50 UNITS SUBCUTANEOUSLY ONCE DAILY AT BEDTIME 1 mL 0  ? Lancets (ONETOUCH ULTRASOFT) lancets Use as instructed to check blood sugar 2 times a day. 200 each 12  ? Propylene Glycol (SYSTANE COMPLETE OP) Place 3 drops into both eyes 3 (three) times daily.    ? trolamine salicylate (ASPERCREME) 10 % cream Apply 1 application topically as needed for muscle pain. Apply to feet    ? venlafaxine XR (EFFEXOR-XR) 150 MG 24 hr capsule TAKE ONE CAPSULE BY MOUTH EVERY MORNING and TAKE ONE CAPSULE BY MOUTH EVERYDAY AT BEDTIME 180 capsule 3  ? vitamin B-12 (CYANOCOBALAMIN) 500 MCG tablet Take 500 mcg by mouth daily.    ? omeprazole (PRILOSEC) 20 MG capsule Take 1 capsule (20 mg total) by  mouth 2 (two) times daily before a meal. 180 capsule 3  ? ?No current facility-administered medications on file prior to visit.  ? ? ?Allergies  ?Allergen Reactions  ? Onglyza [Saxagliptin] Hives and Nausea And Vomiting  ? Liraglutide Nausea Only  ?  Can't eat, nausea, higher sugars  ? ? ?Past Medical History:  ?Diagnosis Date  ? Depression   ? Diabetes mellitus type II   ? Hyperlipemia   ? Major depressive disorder, recurrent, in remission ([place]) 09/01/2006  ? Qualifier: Diagnosis of  By: Lelon Mast    ? Obesity   ? Osteopenia   ? Syncope 1998  ? DM diagnosis  ? ? ?Past Surgical History:  ?Procedure Laterality Date  ? ABDOMINAL HYSTERECTOMY  ~2005  ? and BSO  ? BREAST BIOPSY Right 01/05/2019  ? rt stereo bx 2 areas 1st area ribbon clip  ? BREAST BIOPSY Right 01/05/2019  ? rt stereo bx 2nd area coil clip  ? CHOLECYSTECTOMY  1975  ? COSMETIC SURGERY    ? Injury Right face as a child  ? ESOPHAGOGASTRODUODENOSCOPY (EGD) WITH PROPOFOL N/A 11/18/2020  ? Procedure: ESOPHAGOGASTRODUODENOSCOPY (EGD) WITH PROPOFOL;  Surgeon: Jonathon Bellows, MD;  [facility] ENDOSCOPY;  Service: Gastroenterology;  Laterality: N/A;  ? VAGINAL DELIVERY    ? X 2  ? ? ?Family History  ?Problem Relation Age of Onset  ? Stroke Father   ?  CVA  ? Heart disease Father   ?     MI  ? Cancer Sister   ?     breast cancer  ? Breast cancer Sister 21  ? Heart disease Brother   ?     MI  ? Cancer Brother   ?     Lung  ? ? ?Social History  ? ?Socioeconomic History  ? Marital status: Married  ?  Spouse name: Not on file  ? Number of children: 2  ? Years of education: Not on file  ? Highest education level: Not on file  ?Occupational History  ?  Comment: "Just up and quit" at [employer]  ?Tobacco Use  ? Smoking status: Former  ?  Types: Cigarettes  ?  Quit date: 03/15/1993  ?  Years since quitting: 28.3  ? Smokeless tobacco: Never  ?Vaping Use  ? Vaping Use: Never used  ?Substance and Sexual Activity  ? Alcohol use: No  ?  Alcohol/week: 0.0 standard drinks  ?  Drug use: No  ? Sexual activity: Not on file  ?Other Topics Concern  ? Not on file  ?Social History Narrative  ? Has living will  ? Husband, then daughter [name], have health care POA  ? Would reluctantly accept resuscitation but no life prolonging measures  ? ?Social Determinants of Health  ? ?Financial Resource Strain: Not on file  ?Food Insecurity: Not on file  ?Transportation Needs: No Transportation Needs  ? Lack of Transportation (Medical): No  ? Lack of Transportation (Non-Medical): No  ?Physical Activity: Not on file  ?Stress: Not on file  ?Social Connections: Not on file  ?Intimate Partner Violence: Not on file  ? ?Review of Systems ?Sleeping okay with gabapentin ?Still has pain in her feet ?   ?Objective:  ? Physical Exam ?Constitutional:   ?   Appearance: Normal appearance.  ?Neurological:  ?   Mental Status: She is alert.  ?Psychiatric:     ?   Mood and Affect: Mood normal.     ?   Behavior: Behavior normal.  ?   Comments: Not depressed  ?  ? ? ? ? ?   ?Assessment & Plan:  ? ?

## 2021-07-10 NOTE — Assessment & Plan Note (Signed)
Incidental finding on MRI ?Discussed that it wouldn't be a cause of memory problems ?No other focal neurologic issues ?Will hold off on repeat MRI for now ?

## 2021-07-10 NOTE — Assessment & Plan Note (Signed)
Not clearly worse--but they think it is different ?Did have work up--without clear concerns ?Discussed referral to neurologist---will wait 3 months and reevaluate ?

## 2021-08-03 ENCOUNTER — Telehealth: Payer: Self-pay

## 2021-08-03 NOTE — Chronic Care Management (AMB) (Cosign Needed Addendum)
Chronic Care Management Pharmacy Assistant   Name: Jacqueline Cole  MRN: [id number] DOB: 03-05-1951   Reason for Encounter: Medication Adherence and Delivery Coordination   Recent office visits:  07/10/21-Jacqueline Letvak,MD(PCP)-follow up diabetes, new labs( A1c 8.4) start Jardiance 10mg , take 1 tablet before breakfast. Follow up 3 months   Recent consult visits:  None since last CCM contact   Hospital visits:  None in previous 6 months  Medications: Outpatient Encounter Medications as of 08/03/2021  Medication Sig   Blood Glucose Monitoring Suppl ([place]) w/Device KIT Use to check blood sugar 2 times a day.   empagliflozin (JARDIANCE) 10 MG TABS tablet Take 1 tablet (10 mg total) by mouth daily before breakfast.   gabapentin (NEURONTIN) 300 MG capsule TAKE TWO CAPSULES BY MOUTH EVERYDAY AT BEDTIME   glucose blood (ONETOUCH VERIO) test strip Use as instructed to check blood sugar 2 times a day.   hydrocortisone cream 1 % Apply 1 application topically daily as needed for itching.   insulin glargine (LANTUS) 100 UNIT/ML injection Inject 0.3 mLs (30 Units total) into the skin daily. INJECT 25-50 UNITS SUBCUTANEOUSLY ONCE DAILY AT BEDTIME   Lancets (ONETOUCH ULTRASOFT) lancets Use as instructed to check blood sugar 2 times a day.   omeprazole (PRILOSEC) 20 MG capsule Take 1 capsule (20 mg total) by mouth 2 (two) times daily before a meal.   Propylene Glycol (SYSTANE COMPLETE OP) Place 3 drops into both eyes 3 (three) times daily.   trolamine salicylate (ASPERCREME) 10 % cream Apply 1 application topically as needed for muscle pain. Apply to feet   venlafaxine XR (EFFEXOR-XR) 150 MG 24 hr capsule TAKE ONE CAPSULE BY MOUTH EVERY MORNING and TAKE ONE CAPSULE BY MOUTH EVERYDAY AT BEDTIME   vitamin B-12 (CYANOCOBALAMIN) 500 MCG tablet Take 500 mcg by mouth daily.   No facility-administered encounter medications on file as of 08/03/2021.    BP Readings from Last 3  Encounters:  07/10/21 136/76  04/10/21 110/60  01/07/21 118/80    Lab Results  Component Value Date   HGBA1C 8.4 (A) 07/10/2021      Recent OV, Consult or Hospital visit:  Recent medication changes indicated:  Start Jardiance 10mg  take 1 tablet before breakfast        (07/10/21 PCP)   Last adherence delivery date:07/15/21      Patient is due for next adherence delivery on: 08/13/21  Spoke with patient on 08/03/21 reviewed medications and coordinated delivery.  Spoke with husband Jacqueline Cole   This delivery to include: Adherence Packaging  30 Days  Packs: Venlafaxine ER 150mg   take 1 tablet breakfast and 1 tablet bedtime Gabapentin 300mg  take 2 tablets bedtime Vitamin D3 37mcg  take 1 tablet breakfast Vitamin B12 530mcg take 1 tablet breakfast Jardiance 10mg   take 1 tablet breakfast   (started 07/10/21)  VIAL medications:   Omeprazole 20mg   take 1 tablet breakfast,1 tablet evening meal   (patient request 08/03/21) Lantus 100 unit/ml -inject 30 units at bedtime  Patient declined the following medications this month:    Any concerns about your medications? No  How often do you forget or accidentally miss a dose? Rarely  Do you use a pillbox? No  Is patient in packaging Yes  If yes  What is the date on your next pill pack? Not at home to read package date   Any concerns or issues with your packaging? No concerns at this time    Refills requested from providers  include: Lantus 100 unit/ml  (Dr.Letvak last refill 04/10/21)   Confirmed delivery date of 08/13/21, advised patient that pharmacy will contact them the morning of delivery.  Recent blood pressure readings are as follows: none available   Recent blood glucose readings are as follows: none available    Annual wellness visit in last year? Yes Most Recent BP reading: 136/76  75-P  07/10/21  If Diabetic: Most recent A1C reading:8.4  Last eye exam / retinopathy screening:2019 Last diabetic foot exam: UTD  Jacqueline Cole, CPP notified    Jacqueline Cole, [place]  [phone]

## 2021-09-02 ENCOUNTER — Telehealth: Payer: Self-pay

## 2021-09-02 NOTE — Chronic Care Management (AMB) (Signed)
Chronic Care Management Pharmacy Assistant   Name: Jacqueline Cole  MRN: [id number] DOB: February 26, 1951  Reason for Encounter: Medication Adherence and Delivery Coordination    PTM  Recent office visits:  None since last CCM contact   Recent consult visits:  None since last CCM contact   Hospital visits:  None in previous 6 months  Medications: Outpatient Encounter Medications as of 09/02/2021  Medication Sig   Blood Glucose Monitoring Suppl ([place]) w/Device KIT Use to check blood sugar 2 times a day.   empagliflozin (JARDIANCE) 10 MG TABS tablet Take 1 tablet (10 mg total) by mouth daily before breakfast.   gabapentin (NEURONTIN) 300 MG capsule TAKE TWO CAPSULES BY MOUTH EVERYDAY AT BEDTIME   glucose blood (ONETOUCH VERIO) test strip Use as instructed to check blood sugar 2 times a day.   hydrocortisone cream 1 % Apply 1 application topically daily as needed for itching.   insulin glargine (LANTUS) 100 UNIT/ML injection Inject 0.3 mLs (30 Units total) into the skin daily. INJECT 25-50 UNITS SUBCUTANEOUSLY ONCE DAILY AT BEDTIME   Lancets (ONETOUCH ULTRASOFT) lancets Use as instructed to check blood sugar 2 times a day.   omeprazole (PRILOSEC) 20 MG capsule Take 1 capsule (20 mg total) by mouth 2 (two) times daily before a meal.   Propylene Glycol (SYSTANE COMPLETE OP) Place 3 drops into both eyes 3 (three) times daily.   trolamine salicylate (ASPERCREME) 10 % cream Apply 1 application topically as needed for muscle pain. Apply to feet   venlafaxine XR (EFFEXOR-XR) 150 MG 24 hr capsule TAKE ONE CAPSULE BY MOUTH EVERY MORNING and TAKE ONE CAPSULE BY MOUTH EVERYDAY AT BEDTIME   vitamin B-12 (CYANOCOBALAMIN) 500 MCG tablet Take 500 mcg by mouth daily.   No facility-administered encounter medications on file as of 09/02/2021.   BP Readings from Last 3 Encounters:  07/10/21 136/76  04/10/21 110/60  01/07/21 118/80    Lab Results  Component Value Date   HGBA1C  8.4 (A) 07/10/2021      No OVs, Consults, or hospital visits since last care coordination call / Pharmacist visit. No medication changes indicated   Last adherence delivery date:08/13/21  Patient is due for next adherence delivery on: 09/14/21  Spoke with patient on 09/02/21 reviewed medications and coordinated delivery.  This delivery to include: Adherence Packaging  30 Days  Packs: Venlafaxine ER 150mg   take 1 tablet breakfast and 1 tablet bedtime Gabapentin 300mg  take 2 tablets bedtime Vitamin D3 52mcg  take 1 tablet breakfast Vitamin B12 557mcg take 1 tablet breakfast Jardiance 10mg   take 1 tablet breakfast  VIAL medications: Lantus 100 unit/ml -inject 30 units at bedtime  Patient declined the following medications this month: Omeprazole 20mg   take 1 tablet breakfast,1 tablet evening meal - plenty on hand this month    Any concerns about your medications? No  How often do you forget or accidentally miss a dose? Never  Do you use a pillbox? No  Is patient in packaging Yes  What is the date on your next pill pack? Not home to read pack date   Any concerns or issues with your packaging? No concerns at this time    No refill request needed.  Confirmed delivery date of 09/14/21, advised patient that pharmacy will contact them the morning of delivery.  Recent blood pressure readings are as follows:none available    Recent blood glucose readings are as follows:none available     Annual wellness visit  in last year? Yes Most Recent BP reading: 136/76  75-P  07/10/21   If Diabetic: Most recent A1C reading:8.4  Last eye exam / retinopathy screening:2019 Last diabetic foot exam: UTD    [name], CPP notified  [name], [place]  [phone]

## 2021-09-24 ENCOUNTER — Telehealth: Payer: Self-pay | Admitting: *Deleted

## 2021-09-24 NOTE — Telephone Encounter (Signed)
  Care Management   Follow Up Note   09/24/2021 Name: Jacqueline Cole MRN: [id number] DOB: 1951/01/23   Referred by: Venia Carbon, MD Reason for referral : No chief complaint on file.   Third unsuccessful telephone outreach was attempted today. The patient was referred to the case management team for assistance with care management and care coordination. The patient's primary care provider has been notified of our unsuccessful attempts to make or maintain contact with the patient. The care management team is pleased to engage with this patient at any time in the future should he/she be interested in assistance from the care management team.   Follow Up Plan: No further follow up required:    Eduard Clos MSW, LCSW Licensed Clinical Social Worker [place]   [phone]

## 2021-10-02 ENCOUNTER — Telehealth: Payer: Self-pay

## 2021-10-02 NOTE — Progress Notes (Signed)
Entered error

## 2021-10-02 NOTE — Chronic Care Management (AMB) (Unsigned)
    Chronic Care Management Pharmacy Assistant   Name: Jacqueline Cole  MRN: [id number] DOB: 1950-03-27   Reason for Encounter: Medication Adherence and Delivery Coordination   Recent office visits:  None since last CCM contact  Recent consult visits:  None since last CCM contact  Hospital visits:  None in previous 6 months  Medications: Outpatient Encounter Medications as of 10/02/2021  Medication Sig   Blood Glucose Monitoring Suppl ([place]) w/Device KIT Use to check blood sugar 2 times a day.   empagliflozin (JARDIANCE) 10 MG TABS tablet Take 1 tablet (10 mg total) by mouth daily before breakfast.   gabapentin (NEURONTIN) 300 MG capsule TAKE TWO CAPSULES BY MOUTH EVERYDAY AT BEDTIME   glucose blood (ONETOUCH VERIO) test strip Use as instructed to check blood sugar 2 times a day.   hydrocortisone cream 1 % Apply 1 application topically daily as needed for itching.   insulin glargine (LANTUS) 100 UNIT/ML injection Inject 0.3 mLs (30 Units total) into the skin daily. INJECT 25-50 UNITS SUBCUTANEOUSLY ONCE DAILY AT BEDTIME   Lancets (ONETOUCH ULTRASOFT) lancets Use as instructed to check blood sugar 2 times a day.   omeprazole (PRILOSEC) 20 MG capsule Take 1 capsule (20 mg total) by mouth 2 (two) times daily before a meal.   Propylene Glycol (SYSTANE COMPLETE OP) Place 3 drops into both eyes 3 (three) times daily.   trolamine salicylate (ASPERCREME) 10 % cream Apply 1 application topically as needed for muscle pain. Apply to feet   venlafaxine XR (EFFEXOR-XR) 150 MG 24 hr capsule TAKE ONE CAPSULE BY MOUTH EVERY MORNING and TAKE ONE CAPSULE BY MOUTH EVERYDAY AT BEDTIME   vitamin B-12 (CYANOCOBALAMIN) 500 MCG tablet Take 500 mcg by mouth daily.   No facility-administered encounter medications on file as of 10/02/2021.   BP Readings from Last 3 Encounters:  07/10/21 136/76  04/10/21 110/60  01/07/21 118/80    Lab Results  Component Value Date   HGBA1C 8.4 (A)  07/10/2021      {Upstream med sync 1:25020} {Upstream med sync 2:25021}   Last adherence delivery date:***      Patient is due for next adherence delivery on: ***  {Med Review:25223}  This delivery to include: {Packaging choices:23805}  {Day Supply:23806}  Packs:  VIAL medications:    Patient declined the following medications this month:    Any concerns about your medications? {yes/no:20286}  How often do you forget or accidentally miss a dose? {Missed doses:25554}  Do you use a pillbox? {yes/no:20286}  Is patient in packaging {yes/no:20286}  If yes  What is the date on your next pill pack?  Any concerns or issues with your packaging?   {refills needed:25320}  {Delivery date:25786}  Recent blood pressure readings are as follows:***   Recent blood glucose readings are as follows:*** Fasting:  Before Meals:  After Meals:  Bedtime:   Annual wellness visit in last year? {yes/no:20286} Most Recent BP reading:  If Diabetic: Most recent A1C reading: Last eye exam / retinopathy screening: Last diabetic foot exam:  Cycle dispensing form sent to {Medreview:27653} for review.  [name], CPP notified  [name], [place]  [phone]

## 2021-10-21 ENCOUNTER — Encounter: Payer: Self-pay | Admitting: Internal Medicine

## 2021-10-21 ENCOUNTER — Ambulatory Visit (INDEPENDENT_AMBULATORY_CARE_PROVIDER_SITE_OTHER): Payer: PPO | Admitting: Internal Medicine

## 2021-10-21 VITALS — BP 124/74 | HR 80 | Temp 97.6°F | Ht 65.0 in | Wt 209.0 lb

## 2021-10-21 DIAGNOSIS — E1142 Type 2 diabetes mellitus with diabetic polyneuropathy: Secondary | ICD-10-CM | POA: Diagnosis not present

## 2021-10-21 DIAGNOSIS — F331 Major depressive disorder, recurrent, moderate: Secondary | ICD-10-CM | POA: Diagnosis not present

## 2021-10-21 DIAGNOSIS — D329 Benign neoplasm of meninges, unspecified: Secondary | ICD-10-CM | POA: Diagnosis not present

## 2021-10-21 DIAGNOSIS — Z0000 Encounter for general adult medical examination without abnormal findings: Secondary | ICD-10-CM

## 2021-10-21 LAB — LIPID PANEL
Cholesterol: 251 mg/dL — ABNORMAL HIGH (ref 0–200)
HDL: 38.6 mg/dL — ABNORMAL LOW (ref >39.00)
NonHDL: 212.78
Total CHOL/HDL Ratio: 7
Triglycerides: 298 mg/dL — ABNORMAL HIGH (ref 0.0–149.0)
VLDL: 59.6 mg/dL — ABNORMAL HIGH (ref 0.0–40.0)

## 2021-10-21 LAB — HEPATIC FUNCTION PANEL
ALT: 8 U/L (ref 0–35)
AST: 14 U/L (ref 0–37)
Albumin: 4 g/dL (ref 3.5–5.2)
Alkaline Phosphatase: 132 U/L — ABNORMAL HIGH (ref 39–117)
Bilirubin, Direct: 0.1 mg/dL (ref 0.0–0.3)
Total Bilirubin: 0.8 mg/dL (ref 0.2–1.2)
Total Protein: 6.9 g/dL (ref 6.0–8.3)

## 2021-10-21 LAB — CBC
HCT: 43.5 % (ref 36.0–46.0)
Hemoglobin: 14.1 g/dL (ref 12.0–15.0)
MCHC: 32.3 g/dL (ref 30.0–36.0)
MCV: 85.3 fl (ref 78.0–100.0)
Platelets: 270 10*3/uL (ref 150.0–400.0)
RBC: 5.1 Mil/uL (ref 3.87–5.11)
RDW: 15.4 % (ref 11.5–15.5)
WBC: 9.9 10*3/uL (ref 4.0–10.5)

## 2021-10-21 LAB — RENAL FUNCTION PANEL
Albumin: 4 g/dL (ref 3.5–5.2)
BUN: 11 mg/dL (ref 6–23)
CO2: 30 mEq/L (ref 19–32)
Calcium: 9.2 mg/dL (ref 8.4–10.5)
Chloride: 99 mEq/L (ref 96–112)
Creatinine, Ser: 1.27 mg/dL — ABNORMAL HIGH (ref 0.40–1.20)
GFR: 42.88 mL/min — ABNORMAL LOW (ref >60.00)
Glucose, Bld: 92 mg/dL (ref 70–99)
Phosphorus: 3.7 mg/dL (ref 2.3–4.6)
Potassium: 4 mEq/L (ref 3.5–5.1)
Sodium: 140 mEq/L (ref 135–145)

## 2021-10-21 LAB — MICROALBUMIN / CREATININE URINE RATIO
Creatinine,U: 88.5 mg/dL
Microalb Creat Ratio: 1.4 mg/g (ref 0.0–30.0)
Microalb, Ur: 1.2 mg/dL (ref 0.0–1.9)

## 2021-10-21 LAB — HEMOGLOBIN A1C: Hgb A1c MFr Bld: 9 % — ABNORMAL HIGH (ref 4.6–6.5)

## 2021-10-21 LAB — LDL CHOLESTEROL, DIRECT: Direct LDL: 163 mg/dL

## 2021-10-21 LAB — HM DIABETES FOOT EXAM

## 2021-10-21 MED ORDER — EMPAGLIFLOZIN 10 MG PO TABS: 10.0000 mg | ORAL_TABLET | Freq: Every day | ORAL | 3 refills | Status: DC

## 2021-10-21 MED ORDER — CYANOCOBALAMIN 500 MCG PO TABS: 500.0000 ug | ORAL_TABLET | Freq: Every day | ORAL | 3 refills | Status: DC

## 2021-10-21 MED ORDER — GABAPENTIN 300 MG PO CAPS: ORAL_CAPSULE | ORAL | 3 refills | Status: DC

## 2021-10-21 MED ORDER — HYDROCORTISONE 1 % EX CREA: 1.0000 | TOPICAL_CREAM | Freq: Every day | CUTANEOUS | 5 refills | Status: DC | PRN

## 2021-10-21 MED ORDER — OMEPRAZOLE 20 MG PO CPDR: 20.0000 mg | DELAYED_RELEASE_CAPSULE | Freq: Every day | ORAL | 3 refills | Status: DC

## 2021-10-21 MED ORDER — ATORVASTATIN CALCIUM 20 MG PO TABS: 20.0000 mg | ORAL_TABLET | ORAL | 3 refills | Status: DC

## 2021-10-21 MED ORDER — VENLAFAXINE HCL ER 150 MG PO CP24: ORAL_CAPSULE | ORAL | 3 refills | Status: DC

## 2021-10-21 MED ORDER — TROLAMINE SALICYLATE 10 % EX CREA: 1.0000 | TOPICAL_CREAM | CUTANEOUS | 5 refills | Status: DC | PRN

## 2021-10-21 NOTE — Assessment & Plan Note (Signed)
I have personally reviewed the Medicare Annual Wellness questionnaire and have noted 1. The patient's medical and social history 2. Their use of alcohol, tobacco or illicit drugs 3. Their current medications and supplements 4. The patient's functional ability including ADL's, fall risks, home safety risks and hearing or visual             impairment. 5. Diet and physical activities 6. Evidence for depression or mood disorders  The patients weight, height, BMI and visual acuity have been recorded in the chart I have made referrals, counseling and provided education to the patient based review of the above and I have provided the pt with a written personalized care plan for preventive services.  I have provided you with a copy of your personalized plan for preventive services. Please take the time to review along with your updated medication list.  Reviewed colon and breast cancer screening--she does not want this Updated COVID and flu vaccines in the fall She had one shingrix and will get the second Discussed increasing exercise (should help her energy) Use walker all the time to reduce fall risk

## 2021-10-21 NOTE — Assessment & Plan Note (Signed)
Seen on MRI last year No symptoms---so no action

## 2021-10-21 NOTE — Progress Notes (Signed)
Subjective:    Patient ID: Jacqueline Cole, female    DOB: 12/15/1950, 71 y.o.   MRN: [id number]  HPI Here for Medicare wellness visit and follow up of chronic health conditions Reviewed form and advanced directives Reviewed other doctors No alcohol or tobacco Vision is changing--will be going back soon Hearing is fading--plans to set up audiologist Has fallen at least a couple of times---relates to dizziness. Does use walker in house and cane when out Chronic mood issues Doesn't drive. Husband does the housework. She does ADLs--but now using shower chair Mild memory issues have not worsened  Still has no energy Since hospitalizations last year Walks a little--but not much. GIves out quickly (like after a bath)  Sugars still good Checks in AM-- 95-114 Had one low sugar reaction--last week. Felt heart racing---better after small amount of coke Ongoing neuropathy --uses the gabapentin at bedtime   Depression seems controlled She doesn't feel this is causing the fatigue Hasn't been out as much with the outside smoke  No headaches No focal arm/leg weakness---just some right arm pain  On prilosec daily No heartburn or dysphagia on this  Current Outpatient Medications on File Prior to Visit  Medication Sig Dispense Refill   Blood Glucose Monitoring Suppl ([place]) w/Device KIT Use to check blood sugar 2 times a day. 1 kit 0   empagliflozin (JARDIANCE) 10 MG TABS tablet Take 1 tablet (10 mg total) by mouth daily before breakfast. 90 tablet 3   gabapentin (NEURONTIN) 300 MG capsule TAKE TWO CAPSULES BY MOUTH EVERYDAY AT BEDTIME 180 capsule 3   glucose blood (ONETOUCH VERIO) test strip Use as instructed to check blood sugar 2 times a day. 200 each 12   hydrocortisone cream 1 % Apply 1 application topically daily as needed for itching.     insulin glargine (LANTUS) 100 UNIT/ML injection Inject 0.3 mLs (30 Units total) into the skin daily. INJECT 25-50 UNITS  SUBCUTANEOUSLY ONCE DAILY AT BEDTIME (Patient taking differently: Inject 30 Units into the skin daily. INJECT  30 UNITS SUBCUTANEOUSLY ONCE DAILY AT BEDTIME) 1 mL 0   Lancets (ONETOUCH ULTRASOFT) lancets Use as instructed to check blood sugar 2 times a day. 200 each 12   omeprazole (PRILOSEC) 20 MG capsule Take 1 capsule (20 mg total) by mouth 2 (two) times daily before a meal. 180 capsule 3   Propylene Glycol (SYSTANE COMPLETE OP) Place 3 drops into both eyes 3 (three) times daily.     trolamine salicylate (ASPERCREME) 10 % cream Apply 1 application topically as needed for muscle pain. Apply to feet     venlafaxine XR (EFFEXOR-XR) 150 MG 24 hr capsule TAKE ONE CAPSULE BY MOUTH EVERY MORNING and TAKE ONE CAPSULE BY MOUTH EVERYDAY AT BEDTIME 180 capsule 3   vitamin B-12 (CYANOCOBALAMIN) 500 MCG tablet Take 500 mcg by mouth daily.     No current facility-administered medications on file prior to visit.    Allergies  Allergen Reactions   Onglyza [Saxagliptin] Hives and Nausea And Vomiting   Liraglutide Nausea Only    Can't eat, nausea, higher sugars    Past Medical History:  Diagnosis Date   Depression    Diabetes mellitus type II    Hyperlipemia    Major depressive disorder, recurrent, in remission ([place]) 09/01/2006   Qualifier: Diagnosis of  By: Lelon Mast     Obesity    Osteopenia    Syncope 1998   DM diagnosis    Past Surgical History:  Procedure Laterality Date   ABDOMINAL HYSTERECTOMY  ~2005   and BSO   BREAST BIOPSY Right 01/05/2019   rt stereo bx 2 areas 1st area ribbon clip   BREAST BIOPSY Right 01/05/2019   rt stereo bx 2nd area coil clip   CHOLECYSTECTOMY  1975   COSMETIC SURGERY     Injury Right face as a child   ESOPHAGOGASTRODUODENOSCOPY (EGD) WITH PROPOFOL N/A 11/18/2020   Procedure: ESOPHAGOGASTRODUODENOSCOPY (EGD) WITH PROPOFOL;  Surgeon: Jonathon Bellows, MD;  Location: [facility] ENDOSCOPY;  Service: Gastroenterology;  Laterality: N/A;   VAGINAL DELIVERY     X 2     Family History  Problem Relation Age of Onset   Stroke Father        CVA   Heart disease Father        MI   Cancer Sister        breast cancer   Breast cancer Sister 21   Heart disease Brother        MI   Cancer Brother        Lung    Social History   Socioeconomic History   Marital status: Married    Spouse name: Not on file   Number of children: 2   Years of education: Not on file   Highest education level: Not on file  Occupational History    Comment: "Just up and quit" at [employer]   Smoking status: Former    Types: Cigarettes    Quit date: 03/15/1993    Years since quitting: 28.6   Smokeless tobacco: Never  Vaping Use   Vaping Use: Never used  Substance and Sexual Activity   Alcohol use: No    Alcohol/week: 0.0 standard drinks of alcohol   Drug use: No   Sexual activity: Not on file  Other Topics Concern   Not on file  Social History Narrative   Has living will   Husband, then daughter [name], have health care POA   Would reluctantly accept resuscitation but no life prolonging measures   Social Determinants of Health   Financial Resource Strain: Low Risk  (04/03/2020)   Overall Financial Resource Strain (CARDIA)    Difficulty of Paying Living Expenses: Not very hard  Food Insecurity: Not on file  Transportation Needs: No Transportation Needs (02/26/2021)   PRAPARE - Hydrologist (Medical): No    Lack of Transportation (Non-Medical): No  Physical Activity: Not on file  Stress: Not on file  Social Connections: Not on file  Intimate Partner Violence: Not on file   Review of Systems Appetite is fair Weight back down some Sleeps well Wears seat belt Full dentures---considering implants Bowels slow at times---uses miralax prn Voids fine---some urge incontinence (wears depends) No sig back or joint pain No suspicious skin lesions--no derm. Just very dry    Objective:   Physical Exam Constitutional:       Appearance: Normal appearance.  HENT:     Mouth/Throat:     Comments: No lesions Eyes:     Conjunctiva/sclera: Conjunctivae normal.     Pupils: Pupils are equal, round, and reactive to light.  Cardiovascular:     Rate and Rhythm: Normal rate and regular rhythm.     Heart sounds: No murmur heard.    No gallop.     Comments: Faint pedal pulses Pulmonary:     Effort: Pulmonary effort is normal.     Breath sounds: Normal breath sounds. No wheezing  or rales.  Abdominal:     Palpations: Abdomen is soft.     Tenderness: There is no abdominal tenderness.  Musculoskeletal:     Cervical back: Neck supple.     Right lower leg: No edema.     Left lower leg: No edema.  Lymphadenopathy:     Cervical: No cervical adenopathy.  Skin:    Findings: No rash.     Comments: No foot lesions  Neurological:     General: No focal deficit present.     Mental Status: She is alert and oriented to person, place, and time.     Comments: Mini-cog-- clock fine, recall 1/3 Decreased sensation in feet  Psychiatric:        Mood and Affect: Mood normal.        Behavior: Behavior normal.            Assessment & Plan:

## 2021-10-21 NOTE — Assessment & Plan Note (Addendum)
If A1c is still over 8%--will increase jardiance to 25 On lantus 30 units also Gabapentin '600mg'$  at bedtime for the neuropathy  Will start twice a week statin as well

## 2021-10-21 NOTE — Assessment & Plan Note (Signed)
Discussed the need to get out more Will continue venlafaxine '150mg'$  bid indefinitely

## 2021-10-22 ENCOUNTER — Telehealth: Payer: Self-pay

## 2021-10-22 NOTE — Telephone Encounter (Signed)
-----   Message from Venia Carbon, MD sent at 10/22/2021  1:28 PM EDT ----- Results released Please set her up for follow up in 3 months

## 2021-10-22 NOTE — Telephone Encounter (Signed)
I left a message for the patient to return my call.

## 2021-10-23 NOTE — Telephone Encounter (Addendum)
Left another message on VM to call office to set up appt. Pt has not seen results on MyChart.  Per Dr Silvio Pate: Well, your labs have some concerns The diabetes control has worsened some--but so did your kidneys (creatinine and GFR) ---so I don't want to increase your jardiance. Try going up on your insulin to 35 daily and let me know if you have trouble with low sugar reactions. You should also be as careful as you can with sugar and other carbs Larene Beach will call and set you up to come back in 3 months to recheck all this The alkaline phosphatase is not worrisome Cholesterol is up--but the twice a week cholesterol med should help Everything else was normal

## 2021-11-02 ENCOUNTER — Telehealth: Payer: Self-pay

## 2021-11-02 DIAGNOSIS — E1142 Type 2 diabetes mellitus with diabetic polyneuropathy: Secondary | ICD-10-CM

## 2021-11-02 MED ORDER — FREESTYLE LIBRE 3 SENSOR MISC: 6 refills | Status: DC

## 2021-11-02 MED ORDER — INSULIN GLARGINE 100 UNIT/ML ~~LOC~~ SOLN: 35.0000 [IU] | Freq: Every day | SUBCUTANEOUS | 0 refills | Status: DC

## 2021-11-02 NOTE — Telephone Encounter (Addendum)
Spoke with patient, there has been a miscommunication regarding her Lantus. She was prescribed 30 units once daily prior to recent PCP visit 8/9, but she has actually been taking Lantus 30 units BID (60 units total per day - 0.63 unit/kg/day) for montths, so she is at the point that increasing basal insulin further is unlikely to yield further benefit.  Her fasting BG has been 95-120 the last few weeks. She does not often check BG at other times. Discussed it is likely that BG is significantly higher after meals given recent A1c of 9.0%. She has not tolerated Ozempic (vomiting/dehydration) - episode 11/2020 in which Ozempic and metformin were stopped.   Considered re-trial low-dose metformin, however given recent decline in kidney function, would defer this option for now.  Recommendations: -Continue Lantus 30 units BID -Start Freestyle Libre to identify high glucose patterns -Consider Novolog 6 units once daily with largest meal (can also wait for CGM results to better target high sugars)    Lab Results  Component Value Date   CREATININE 1.27 (H) 10/21/2021   BUN 11 10/21/2021   GFR 42.88 (L) 10/21/2021   GFRNONAA >60 11/18/2020   GFRAA 66 10/23/2007   NA 140 10/21/2021   K 4.0 10/21/2021   CALCIUM 9.2 10/21/2021   CO2 30 10/21/2021

## 2021-11-02 NOTE — Telephone Encounter (Signed)
Patient reports she will be out of insulin after today. Pharmacy needs refill. Per chart review PCP has advised pt to increase Lantus to 35 units. Sent new Rx with new directions.

## 2021-11-02 NOTE — Addendum Note (Signed)
Addended by: Charlton Haws on: 11/02/2021 04:50 PM   Modules accepted: Orders

## 2021-11-02 NOTE — Chronic Care Management (AMB) (Signed)
Chronic Care Management Pharmacy Assistant   Name: Jacqueline Cole  MRN: [id number] DOB: 03-08-1951   Reason for Encounter: Medication Adherence and Delivery Coordination   Recent office visits:  10/21/21-[name],MD(PCP)-AWV,labs ordered(The diabetes control has worsened some--but so did your kidneys (creatinine and GFR) ---so I don't want to increase your jardiance.Try going up on your insulin to 35 daily)New A1c 9.0  Recent consult visits:  None since last CCM contact   Hospital visits:  None in previous 6 months  Medications: Outpatient Encounter Medications as of 11/02/2021  Medication Sig   atorvastatin (LIPITOR) 20 MG tablet Take 1 tablet (20 mg total) by mouth 2 (two) times a week.   Blood Glucose Monitoring Suppl ([place]) w/Device KIT Use to check blood sugar 2 times a day.   cyanocobalamin (VITAMIN B12) 500 MCG tablet Take 1 tablet (500 mcg total) by mouth daily.   empagliflozin (JARDIANCE) 10 MG TABS tablet Take 1 tablet (10 mg total) by mouth daily before breakfast.   gabapentin (NEURONTIN) 300 MG capsule TAKE TWO CAPSULES BY MOUTH EVERYDAY AT BEDTIME   glucose blood (ONETOUCH VERIO) test strip Use as instructed to check blood sugar 2 times a day.   hydrocortisone cream 1 % Apply 1 Application topically daily as needed for itching.   insulin glargine (LANTUS) 100 UNIT/ML injection Inject 0.3 mLs (30 Units total) into the skin daily. INJECT 25-50 UNITS SUBCUTANEOUSLY ONCE DAILY AT BEDTIME (Patient taking differently: Inject 30 Units into the skin daily. INJECT  30 UNITS SUBCUTANEOUSLY ONCE DAILY AT BEDTIME)   Lancets (ONETOUCH ULTRASOFT) lancets Use as instructed to check blood sugar 2 times a day.   omeprazole (PRILOSEC) 20 MG capsule Take 1 capsule (20 mg total) by mouth daily.   Propylene Glycol (SYSTANE COMPLETE OP) Place 3 drops into both eyes 3 (three) times daily.   trolamine salicylate (ASPERCREME) 10 % cream Apply 1 Application topically  as needed for muscle pain. Apply to feet   venlafaxine XR (EFFEXOR-XR) 150 MG 24 hr capsule TAKE ONE CAPSULE BY MOUTH EVERY MORNING and TAKE ONE CAPSULE BY MOUTH EVERYDAY AT BEDTIME   No facility-administered encounter medications on file as of 11/02/2021.    BP Readings from Last 3 Encounters:  10/21/21 124/74  07/10/21 136/76  04/10/21 110/60    Lab Results  Component Value Date   HGBA1C 9.0 (H) 10/21/2021      Recent OV, Consult or Hospital visit:  Recent medication changes indicated: 10/21/21-[name],MD(PCP)-AWV,labs ordered(The diabetes control has worsened some--but so did your kidneys (creatinine and GFR) ---so I don't want to increase your jardiance.Try going up on your insulin to 35 daily   Last adherence delivery date:10/13/21      Patient is due for next adherence delivery on: 11/12/21  Spoke with patient on 11/02/21 reviewed medications and coordinated delivery.  This delivery to include: Adherence Packaging  30 Days  Packs: Venlafaxine ER 150mg   take 1 tablet breakfast and 1 tablet bedtime Gabapentin 300mg  take 2 tablets bedtime Vitamin D3 51mcg  take 1 tablet breakfast Vitamin B12 519mcg take 1 tablet breakfast Jardiance 10mg   take 1 tablet breakfast Atorvastatin 20mg  take 2 times week (wednesday,Sunday breakfast)  VIAL medications: Lantus 100 unit/ml -inject 35 units at bedtime    Patient declined the following medications this month: Omeprazole- enough on hand    Any concerns about your medications? Yes  How often do you forget or accidentally miss a dose? Never   Is patient in packaging  Yes  Any concerns or issues with your packaging? Satisfied with process    No refill request needed.  Confirmed delivery date of 11/12/21, advised patient that pharmacy will contact them the morning of delivery.  Recent blood pressure readings are as follows: none available    Recent blood glucose readings are as follows:none available    Annual wellness  visit in last year? Yes Most Recent BP reading:124/74  80-P  If Diabetic: Most recent A1C reading:9.0  10/21/21 Last eye exam / retinopathy screening:2019 Last diabetic foot exam:UTD  Cycle dispensing form sent to [facility], CPP for review.   [name], CPP notified  [name], [place]  [phone]

## 2021-11-03 MED ORDER — INSULIN GLARGINE 100 UNIT/ML ~~LOC~~ SOLN: 30.0000 [IU] | Freq: Two times a day (BID) | SUBCUTANEOUS | 0 refills | Status: DC

## 2021-11-03 NOTE — Telephone Encounter (Signed)
Updated Lantus Rx. Will inform patient to continue same dose and start [facility] as discussed.

## 2021-11-03 NOTE — Addendum Note (Signed)
Addended by: Charlton Haws on: 11/03/2021 07:49 AM   Modules accepted: Orders

## 2021-11-03 NOTE — Telephone Encounter (Signed)
Patient notified to continue insulin and delivery will be today for a refill on Lantus. The patient will wait for [facility] to get [place] 3 in stock. This is available at [facility] but the patient declines.  [name], CPP notified  [name], [place]  [phone]

## 2021-11-12 ENCOUNTER — Telehealth: Payer: Self-pay

## 2021-11-12 NOTE — Telephone Encounter (Signed)
Appointment made and patient confirmed   [name], CPP notified  [name], [place]  [phone]

## 2021-11-12 NOTE — Chronic Care Management (AMB) (Signed)
    Chronic Care Management Pharmacy Assistant   Name: Jacqueline Cole  MRN: [id number] DOB: December 16, 1950  Reason for Encounter: Reminder Call   Recent office visits:  10/21/21-Jacqueline Letvak,MD(PCP)-AWV,Labs ordered(The diabetes control has worsened some--but so did your kidneys (creatinine and GFR) Try going up on your insulin to 35 daily and let me know if you have trouble with low sugar reaction (A1c 9.0)f/u 6 months 07/10/21-Jacqueline Letvak,MD(PCP)-f/u DM,Discussed that glipizide is not the right metabolic choice.Will add jardiance 10mg  daily-f/u 3 months  Recent consult visits:  None since last CCM contact   Hospital visits:  None in previous 6 months  Medications: Outpatient Encounter Medications as of 11/12/2021  Medication Sig   atorvastatin (LIPITOR) 20 MG tablet Take 1 tablet (20 mg total) by mouth 2 (two) times a week.   Blood Glucose Monitoring Suppl ([place]) w/Device KIT Use to check blood sugar 2 times a day.   Continuous Blood Gluc Cole (FREESTYLE LIBRE 3 Cole) MISC Place 1 Cole on the skin every 14 days. Use to check glucose continuously   cyanocobalamin (VITAMIN B12) 500 MCG tablet Take 1 tablet (500 mcg total) by mouth daily.   empagliflozin (JARDIANCE) 10 MG TABS tablet Take 1 tablet (10 mg total) by mouth daily before breakfast.   gabapentin (NEURONTIN) 300 MG capsule TAKE TWO CAPSULES BY MOUTH EVERYDAY AT BEDTIME   glucose blood (ONETOUCH VERIO) test strip Use as instructed to check blood sugar 2 times a day.   hydrocortisone cream 1 % Apply 1 Application topically daily as needed for itching.   insulin glargine (LANTUS) 100 UNIT/ML injection Inject 0.3 mLs (30 Units total) into the skin 2 (two) times daily.   Lancets (ONETOUCH ULTRASOFT) lancets Use as instructed to check blood sugar 2 times a day.   omeprazole (PRILOSEC) 20 MG capsule Take 1 capsule (20 mg total) by mouth daily.   Propylene Glycol (SYSTANE COMPLETE OP) Place 3 drops into both  eyes 3 (three) times daily.   trolamine salicylate (ASPERCREME) 10 % cream Apply 1 Application topically as needed for muscle pain. Apply to feet   venlafaxine XR (EFFEXOR-XR) 150 MG 24 hr capsule TAKE ONE CAPSULE BY MOUTH EVERY MORNING and TAKE ONE CAPSULE BY MOUTH EVERYDAY AT BEDTIME   No facility-administered encounter medications on file as of 11/12/2021.   Jacqueline Cole was contacted to remind of upcoming office visit with Jacqueline Cole on 11/17/21 at 8:45am. Patient was reminded to have any blood glucose and blood pressure readings available for review at appointment.   Patient confirmed appointment.  Are you having any problems with your medications? No   Do you have any concerns you like to discuss with the pharmacist? CGM training   CCM referral has been placed prior to visit?  Yes   Star Rating Drugs: Medication:  Last Fill: Day Supply [facility] Atorvastatin 20mg  10/23/21 22 Jardiance 10mg  10/06/21 30 Lantus   11/03/21 Jacqueline Cole, Jacqueline Cole notified  Jacqueline Cole, [place]  [phone]

## 2021-11-17 ENCOUNTER — Ambulatory Visit (INDEPENDENT_AMBULATORY_CARE_PROVIDER_SITE_OTHER): Payer: PPO | Admitting: Pharmacist

## 2021-11-17 DIAGNOSIS — E1142 Type 2 diabetes mellitus with diabetic polyneuropathy: Secondary | ICD-10-CM

## 2021-11-17 DIAGNOSIS — E785 Hyperlipidemia, unspecified: Secondary | ICD-10-CM

## 2021-11-17 MED ORDER — FREESTYLE LIBRE 3 SENSOR MISC: 6 refills | Status: DC

## 2021-11-17 NOTE — Progress Notes (Signed)
Chronic Care Management Pharmacy Note  11/20/2021 Name:  Jacqueline Cole MRN:  [id number] DOB:  May 28, 1950  Summary: Patient presented for [facility] 3 training.  Demonstrated with teach-back: -components of sensor and how to prepare sensor for application -how to apply sensor to arm. Patient successfully applied sensor to L arm today -how to navigate [employer] app in order to pair sensor; sensor was successfully paired today -how to adjust high/low sugar alarms. Low alarm set to 70 and high alarm set to 300 today. -App users: demonstrated navigating various components including settings, logbook, and daily patterns  Educated patient on: -scanning sensor at least every 8 hours to ensure no gaps in data -changing sensor every 14 days or as initiated by app/reader -avoiding high doses of Vitamin C > 500 mg/day -removing sensor prior to MRI, CT scans -if sugar readings ever seem wrong/questionable, check sugar with a finger stick -Libreview cloud monitoring: Patient did enroll in [place]  Provided customer service line for [employer] in case of sensor breakdowns: [phone]   Pt voiced understanding of above and denies further questions.   Plan: -Pharmacist follow up televisit scheduled for 2 weeks    Subjective: Jacqueline Cole is an 71 y.o. year old female who is a primary patient of Letvak, Berneda Rose, MD.  The CCM team was consulted for assistance with disease management and care coordination needs.    Engaged with patient face to face for follow up visit in response to provider referral for pharmacy case management and/or care coordination services.   Consent to Services:  The patient was given information about Chronic Care Management services, agreed to services, and gave verbal consent prior to initiation of services.  Please see initial visit note for detailed documentation.   Patient Care Team: Karie Schwalbe, MD as PCP - General Lenice Llamas  Garry Heater, [facility] as Pharmacist (Pharmacist)  Recent office visits: 10/21/21 Dr Alphonsus Sias OV: annual - A1c 9.0. LDL 163. Restarted atorvastatin 20 mg 2x weekly. RTC 3 months.  Recent consult visits: N/a  Hospital visits: None in previous 6 months   Objective:  Lab Results  Component Value Date   CREATININE 1.27 (H) 10/21/2021   BUN 11 10/21/2021   GFR 42.88 (L) 10/21/2021   GFRNONAA >60 11/18/2020   GFRAA 66 10/23/2007   NA 140 10/21/2021   K 4.0 10/21/2021   CALCIUM 9.2 10/21/2021   CO2 30 10/21/2021   GLUCOSE 92 10/21/2021    Lab Results  Component Value Date/Time   HGBA1C 9.0 (H) 10/21/2021 10:17 AM   HGBA1C 8.4 (A) 07/10/2021 07:30 AM   HGBA1C 9.1 (A) 04/10/2021 08:21 AM   HGBA1C 5.5 11/16/2020 11:48 AM   GFR 42.88 (L) 10/21/2021 10:17 AM   GFR 62.73 11/13/2020 03:10 PM   MICROALBUR 1.2 10/21/2021 10:17 AM   MICROALBUR 4.0 (H) 11/26/2015 09:58 AM    Last diabetic Eye exam:  Lab Results  Component Value Date/Time   HMDIABEYEEXA Retinopathy (A) 04/25/2017 12:00 AM    Last diabetic Foot exam:  Lab Results  Component Value Date/Time   HMDIABFOOTEX done 10/21/2021 12:00 AM     Lab Results  Component Value Date   CHOL 251 (H) 10/21/2021   HDL 38.60 (L) 10/21/2021   LDLCALC 46 06/07/2019   LDLDIRECT 163.0 10/21/2021   TRIG 298.0 (H) 10/21/2021   CHOLHDL 7 10/21/2021       Latest Ref Rng & Units 10/21/2021   10:17 AM 11/18/2020    5:02 AM  11/17/2020    6:04 AM  Hepatic Function  Total Protein 6.0 - 8.3 g/dL 6.9  4.6  4.2   Albumin 3.5 - 5.2 g/dL 3.5 - 5.2 g/dL 4.0    4.0  2.8  2.5   AST 0 - 37 U/L 14  23  32   ALT 0 - 35 U/L $Remo'8  13  13   'oWlVA$ Alk Phosphatase 39 - 117 U/L 132  51  48   Total Bilirubin 0.2 - 1.2 mg/dL 0.8  1.4  1.4   Bilirubin, Direct 0.0 - 0.3 mg/dL 0.1   0.2     Lab Results  Component Value Date/Time   TSH 3.730 11/16/2020 10:21 AM   TSH 3.85 02/01/2018 08:51 AM   TSH 2.54 09/08/2012 09:36 AM   FREET4 1.55 (H) 11/16/2020 10:21 AM   FREET4  1.06 09/09/2020 10:06 AM       Latest Ref Rng & Units 10/21/2021   10:17 AM 11/18/2020    5:02 AM 11/17/2020    6:04 AM  CBC  WBC 4.0 - 10.5 K/uL 9.9  6.1  9.0   Hemoglobin 12.0 - 15.0 g/dL 14.1  10.4  10.0   Hematocrit 36.0 - 46.0 % 43.5  29.2  28.3   Platelets 150.0 - 400.0 K/uL 270.0  174  173     No results found for: "VD25OH"  Clinical ASCVD: No  The 10-year ASCVD risk score (Arnett DK, et al., 2019) is: 18.9%   Values used to calculate the score:     Age: 71 years     Sex: Female     Is Non-Hispanic African American: No     Diabetic: Yes     Tobacco smoker: No     Systolic Blood Pressure: 045 mmHg     Is BP treated: No     HDL Cholesterol: 38.6 mg/dL     Total Cholesterol: 251 mg/dL       10/21/2021    9:19 AM 02/26/2021    9:14 AM 11/13/2020    2:27 PM  Depression screen PHQ 2/9  Decreased Interest 3 3 0  Down, Depressed, Hopeless 1 3 0  PHQ - 2 Score 4 6 0  Altered sleeping  2 0  Tired, decreased energy  3 0  Change in appetite  0 0  Feeling bad or failure about yourself   1 0  Trouble concentrating  0 0  Moving slowly or fidgety/restless  1 0  Suicidal thoughts  0 0  PHQ-9 Score  13 0  Difficult doing work/chores  Somewhat difficult      Social History   Tobacco Use  Smoking Status Former   Types: Cigarettes   Quit date: 03/15/1993   Years since quitting: 28.7  Smokeless Tobacco Never   BP Readings from Last 3 Encounters:  10/21/21 124/74  07/10/21 136/76  04/10/21 110/60   Pulse Readings from Last 3 Encounters:  10/21/21 80  07/10/21 75  04/10/21 66   Wt Readings from Last 3 Encounters:  10/21/21 209 lb (94.8 kg)  07/10/21 218 lb (98.9 kg)  04/10/21 199 lb (90.3 kg)   BMI Readings from Last 3 Encounters:  10/21/21 34.78 kg/m  07/10/21 36.28 kg/m  04/10/21 33.12 kg/m    Assessment/Interventions: Review of patient past medical history, allergies, medications, health status, including review of consultants reports, laboratory and other  test data, was performed as part of comprehensive evaluation and provision of chronic care management services.   SDOH:  (  Social Determinants of Health) assessments and interventions performed: No SDOH Interventions    Flowsheet Row Chronic Care Management from 02/26/2021 in [place] [facility] [place] Management from 03/20/2020 in [place] at [place] Interventions    Transportation Interventions Intervention Not Indicated --  Depression Interventions/Treatment  Medication, Counseling --  Financial Strain Interventions -- Intervention Not Indicated  [Medications affordable]      SDOH Screenings   Transportation Needs: No Transportation Needs (02/26/2021)  Depression (PHQ2-9): Low Risk  (10/21/2021)  Financial Resource Strain: Low Risk  (04/03/2020)  Tobacco Use: Medium Risk (10/21/2021)    CCM Care Plan  Allergies  Allergen Reactions   Onglyza [Saxagliptin] Hives and Nausea And Vomiting   Ozempic (0.25 Or 0.5 Mg-Dose) [Semaglutide(0.25 Or 0.5mg -Dos)] Nausea And Vomiting   Liraglutide Nausea Only    Can't eat, nausea, higher sugars    Medications Reviewed Today     Reviewed by Venia Carbon, MD (Physician) on 10/21/21 at [phone]  Med List Status: <None>   Medication Order Taking? Sig Documenting Provider Last Dose Status Informant  Blood Glucose Monitoring Suppl (ONETOUCH VERIO FLEX SYSTEM) w/Device KIT [id number] Yes Use to check blood sugar 2 times a day. Philemon Kingdom, MD Taking Active   empagliflozin (JARDIANCE) 10 MG TABS tablet [id number] Yes Take 1 tablet (10 mg total) by mouth daily before breakfast. Venia Carbon, MD Taking Active   gabapentin (NEURONTIN) 300 MG capsule [id number] Yes TAKE TWO CAPSULES BY MOUTH EVERYDAY AT BEDTIME Venia Carbon, MD Taking Active   glucose blood [facility] VERIO) test strip [id number] Yes Use as instructed to check blood sugar 2 times a day. Philemon Kingdom, MD Taking Active   hydrocortisone  cream 1 % [id number] Yes Apply 1 application topically daily as needed for itching. [provider] Taking Active   insulin glargine (LANTUS) 100 UNIT/ML injection [id number] Yes Inject 0.3 mLs (30 Units total) into the skin daily. INJECT 25-50 UNITS SUBCUTANEOUSLY ONCE DAILY AT BEDTIME  Patient taking differently: Inject 30 Units into the skin daily. INJECT  30 UNITS SUBCUTANEOUSLY ONCE DAILY AT BEDTIME   Venia Carbon, MD Taking Active   Lancets [facility] ULTRASOFT) lancets [id number] Yes Use as instructed to check blood sugar 2 times a day. Philemon Kingdom, MD Taking Active   omeprazole (PRILOSEC) 20 MG capsule [id number] Yes Take 1 capsule (20 mg total) by mouth 2 (two) times daily before a meal. Venia Carbon, MD Taking Active   Propylene Glycol (SYSTANE COMPLETE OP) [id number] Yes Place 3 drops into both eyes 3 (three) times daily. [provider] Taking Active   trolamine salicylate (ASPERCREME) 10 % cream [id number] Yes Apply 1 application topically as needed for muscle pain. Apply to feet [provider] Taking Active   venlafaxine XR (EFFEXOR-XR) 150 MG 24 hr capsule [id number] Yes TAKE ONE CAPSULE BY MOUTH EVERY MORNING and TAKE ONE CAPSULE BY MOUTH EVERYDAY AT BEDTIME Venia Carbon, MD Taking Active   vitamin B-12 (CYANOCOBALAMIN) 500 MCG tablet [id number] Yes Take 500 mcg by mouth daily. [provider] Taking Active             Patient Active Problem List   Diagnosis Date Noted   Meningioma [facility]) 07/10/2021   Edema 12/10/2020   Memory loss 09/09/2020   Multiple fibroadenomata of both breasts 04/24/2019   MDD (major depressive disorder), recurrent episode, moderate ([place]) 08/09/2017   Osteopenia    Osteoarthritis of right knee 12/13/2016   Advance directive discussed  with patient 12/13/2016   Type 2 diabetes, controlled, with peripheral neuropathy ([place]) 02/15/2012   Routine general medical examination at a health care facility  05/03/2011   Hyperlipemia 09/01/2006    Immunization History  Administered Date(s) Administered   Fluad Quad(high Dose 65+) 01/07/2021   Influenza Split 01/14/2011   Influenza Whole 12/25/2008, 12/30/2009   Influenza-Unspecified 01/30/2014   Moderna Sars-Covid-2 Vaccination 07/20/2019, 08/17/2019, 02/26/2020   Pneumococcal Conjugate-13 12/13/2016   Pneumococcal Polysaccharide-23 12/25/2008   Td 12/01/2005, 11/26/2015    Conditions to be addressed/monitored:  Hyperlipidemia and Diabetes  Care Plan : [place]  Updates made by Charlton Haws, [place] since 11/20/2021 12:00 AM     Problem: Hyperlipidemia and Diabetes   Priority: High     Long-Range Goal: Disease mgmt   Start Date: 11/17/2021  Expected End Date: 11/21/2022  This Visit's Progress: On track  Priority: High  Note:   Current Barriers:  Unable to independently monitor therapeutic efficacy Unable to maintain control of Diabetes  Pharmacist Clinical Goal(s):  Patient will achieve adherence to monitoring guidelines and medication adherence to achieve therapeutic efficacy achieve improvement in diabetes as evidenced by A1c < 8% through collaboration with PharmD and provider.   Interventions: 1:1 collaboration with Venia Carbon, MD regarding development and update of comprehensive plan of care as evidenced by provider attestation and co-signature Inter-disciplinary care team collaboration (see longitudinal plan of care) Comprehensive medication review performed; medication list updated in electronic medical record  Diabetes (A1c goal <8%) -Uncontrolled  - A1c 9.0% (10/2021) above goal Her fasting BG has been 95-120 the last few weeks. She does not often check BG at other times. Discussed it is likely that BG is significantly higher after meals given recent A1c of 9.0%. She has not tolerated Ozempic (vomiting/dehydration) - episode 11/2020 in which Ozempic and metformin were stopped.  -Considered  re-trial low-dose metformin, however given recent decline in kidney function, would defer this option for now. -Denies hypoglycemic/hyperglycemic symptoms -Current medications: Jardiance 10 mg daily - Appropriate, Query Effective Lantus 30 units BID - Appropriate, Query Effective -Medications previously tried: glipizide, Victoza (n/v), metformin, Ozempic (n/v), Onglyza (hives) -Educated on Continuous glucose monitoring; - Libre 3 training completed 11/17/21 -Recommended to continue current medication  Hyperlipidemia: (LDL goal < 100) -Uncontrolled - LDL 163 (10/2021) without statin, atorvastatin restarted this month -Current treatment: Atorvastatin 20 mg twice a week - Appropriate, Query Effective -Medications previously tried: n/a  -Educated on Cholesterol goals; Benefits of statin for ASCVD risk reduction; -Recommended to continue current medication  Patient Goals/Self-Care Activities Patient will:  - take medications as prescribed as evidenced by patient report and record review focus on medication adherence by routine check glucose via [place] 3, document, and provide at future appointments       Medication Assistance: None required.  Patient affirms current coverage meets needs.  Compliance/Adherence/Medication fill history: Care Gaps: Colonoscopy Mammogram (due 12/24/20) Eye exam (due 04/25/18)  Star-Rating Drugs: Atorvastatin - PDC 100% Jardiance - PDC 98%  Medication Access: Within the past 30 days, how often has patient missed a dose of medication? 0 Is a pillbox or other method used to improve adherence? Yes  Factors that may affect medication adherence? no barriers identified Are meds synced by current pharmacy? Yes  Are meds delivered by current pharmacy? Yes  Does patient experience delays in picking up medications due to transportation concerns? No   [facility] Reviewed: Is patient disadvantaged to use [facility]?: No  Current Rx insurance  plan:  HTA Name and location of Current pharmacy:  [facility] - [place], [place] - [address] [place] [address] Phone: [phone] Fax: [phone]  [facility] services reviewed with patient today?: Yes    Care Plan and Follow Up Patient Decision:  Patient agrees to Care Plan and Follow-up.  Plan: Telephone follow up appointment with care management team member scheduled for:  2 weeks  [name], PharmD, [facility] Clinical Pharmacist [facility] at [facility] [phone]

## 2021-11-20 NOTE — Patient Instructions (Signed)
Visit Information  Phone number for Pharmacist: [phone]   Goals Addressed   None     Care Plan : [place]  Updates made by Charlton Haws, [place] since 11/20/2021 12:00 AM     Problem: Hyperlipidemia and Diabetes   Priority: High     Long-Range Goal: Disease mgmt   Start Date: 11/17/2021  Expected End Date: 11/21/2022  This Visit's Progress: On track  Priority: High  Note:   Current Barriers:  Unable to independently monitor therapeutic efficacy Unable to maintain control of Diabetes  Pharmacist Clinical Goal(s):  Patient will achieve adherence to monitoring guidelines and medication adherence to achieve therapeutic efficacy achieve improvement in diabetes as evidenced by A1c < 8% through collaboration with PharmD and provider.   Interventions: 1:1 collaboration with Venia Carbon, MD regarding development and update of comprehensive plan of care as evidenced by provider attestation and co-signature Inter-disciplinary care team collaboration (see longitudinal plan of care) Comprehensive medication review performed; medication list updated in electronic medical record  Diabetes (A1c goal <8%) -Uncontrolled  - A1c 9.0% (10/2021) above goal Her fasting BG has been 95-120 the last few weeks. She does not often check BG at other times. Discussed it is likely that BG is significantly higher after meals given recent A1c of 9.0%. She has not tolerated Ozempic (vomiting/dehydration) - episode 11/2020 in which Ozempic and metformin were stopped.  -Considered re-trial low-dose metformin, however given recent decline in kidney function, would defer this option for now. -Denies hypoglycemic/hyperglycemic symptoms -Current medications: Jardiance 10 mg daily - Appropriate, Query Effective Lantus 30 units BID - Appropriate, Query Effective -Medications previously tried: glipizide, Victoza (n/v), metformin, Ozempic (n/v), Onglyza (hives) -Educated on Continuous glucose  monitoring; - Libre 3 training completed 11/17/21 -Recommended to continue current medication  Hyperlipidemia: (LDL goal < 100) -Uncontrolled - LDL 163 (10/2021) without statin, atorvastatin restarted this month -Current treatment: Atorvastatin 20 mg twice a week - Appropriate, Query Effective -Medications previously tried: n/a  -Educated on Cholesterol goals; Benefits of statin for ASCVD risk reduction; -Recommended to continue current medication  Patient Goals/Self-Care Activities Patient will:  - take medications as prescribed as evidenced by patient report and record review focus on medication adherence by routine check glucose via [place] 3, document, and provide at future appointments       Patient verbalizes understanding of instructions and care plan provided today and agrees to view in [place]. Active MyChart status and patient understanding of how to access instructions and care plan via MyChart confirmed with patient.    Telephone follow up appointment with pharmacy team member scheduled for: 2 weeks  [name], PharmD, [facility] Clinical Pharmacist [facility] at [facility] [phone]

## 2021-11-23 DIAGNOSIS — E113593 Type 2 diabetes mellitus with proliferative diabetic retinopathy without macular edema, bilateral: Secondary | ICD-10-CM | POA: Diagnosis not present

## 2021-11-23 DIAGNOSIS — H43821 Vitreomacular adhesion, right eye: Secondary | ICD-10-CM | POA: Diagnosis not present

## 2021-11-23 DIAGNOSIS — H31093 Other chorioretinal scars, bilateral: Secondary | ICD-10-CM | POA: Diagnosis not present

## 2021-11-23 DIAGNOSIS — H3563 Retinal hemorrhage, bilateral: Secondary | ICD-10-CM | POA: Diagnosis not present

## 2021-11-23 DIAGNOSIS — H43812 Vitreous degeneration, left eye: Secondary | ICD-10-CM | POA: Diagnosis not present

## 2021-11-26 ENCOUNTER — Telehealth: Payer: Self-pay

## 2021-11-26 NOTE — Chronic Care Management (AMB) (Unsigned)
    Chronic Care Management Pharmacy Assistant   Name: Jacqueline Cole  MRN: [id number] DOB: [birth year]   Reason for Encounter: Reminder Call   Conditions to be addressed/monitored: HLD and DMII   Medications: Outpatient Encounter Medications as of 11/26/2021  Medication Sig   atorvastatin (LIPITOR) 20 MG tablet Take 1 tablet (20 mg total) by mouth 2 (two) times a week.   Blood Glucose Monitoring Suppl ([place]) w/Device KIT Use to check blood sugar 2 times a day.   Continuous Blood Gluc Sensor (FREESTYLE LIBRE 3 SENSOR) MISC Place 1 sensor on the skin every 14 days. Use to check glucose continuously   cyanocobalamin (VITAMIN B12) 500 MCG tablet Take 1 tablet (500 mcg total) by mouth daily.   empagliflozin (JARDIANCE) 10 MG TABS tablet Take 1 tablet (10 mg total) by mouth daily before breakfast.   gabapentin (NEURONTIN) 300 MG capsule TAKE TWO CAPSULES BY MOUTH EVERYDAY AT BEDTIME   glucose blood (ONETOUCH VERIO) test strip Use as instructed to check blood sugar 2 times a day.   hydrocortisone cream 1 % Apply 1 Application topically daily as needed for itching.   insulin glargine (LANTUS) 100 UNIT/ML injection Inject 0.3 mLs (30 Units total) into the skin 2 (two) times daily.   Lancets (ONETOUCH ULTRASOFT) lancets Use as instructed to check blood sugar 2 times a day.   omeprazole (PRILOSEC) 20 MG capsule Take 1 capsule (20 mg total) by mouth daily.   Propylene Glycol (SYSTANE COMPLETE OP) Place 3 drops into both eyes 3 (three) times daily.   trolamine salicylate (ASPERCREME) 10 % cream Apply 1 Application topically as needed for muscle pain. Apply to feet   venlafaxine XR (EFFEXOR-XR) 150 MG 24 hr capsule TAKE ONE CAPSULE BY MOUTH EVERY MORNING and TAKE ONE CAPSULE BY MOUTH EVERYDAY AT BEDTIME   No facility-administered encounter medications on file as of 11/26/2021.   [name] was contacted to remind of upcoming telephone visit with [name] on 12/01/21  at 10:15am. Patient was reminded to have any blood glucose and blood pressure readings available for review at appointment.   {Appointment confirmation:27274}  Are you having any problems with your medications? {yes/no:20286}   Do you have any concerns you like to discuss with the pharmacist? {yes/no:20286}  CCM referral has been placed prior to visit?  Yes   Star Rating Drugs: Medication:  Last Fill: Day Supply Atorvastatin 20mg  11/09/21 30 Jardiance 10mg  11/09/21 30 Lantus    11/03/21 [place], CPP notified  [name], [place]  [phone]

## 2021-11-30 ENCOUNTER — Other Ambulatory Visit: Payer: Self-pay | Admitting: Internal Medicine

## 2021-11-30 DIAGNOSIS — E1142 Type 2 diabetes mellitus with diabetic polyneuropathy: Secondary | ICD-10-CM

## 2021-12-01 ENCOUNTER — Telehealth: Payer: Self-pay

## 2021-12-01 ENCOUNTER — Ambulatory Visit: Payer: PPO | Admitting: Pharmacist

## 2021-12-01 DIAGNOSIS — E1142 Type 2 diabetes mellitus with diabetic polyneuropathy: Secondary | ICD-10-CM

## 2021-12-01 DIAGNOSIS — E785 Hyperlipidemia, unspecified: Secondary | ICD-10-CM

## 2021-12-01 MED ORDER — INSULIN GLARGINE 100 UNIT/ML ~~LOC~~ SOLN: 30.0000 [IU] | Freq: Two times a day (BID) | SUBCUTANEOUS | 11 refills | Status: DC

## 2021-12-01 NOTE — Chronic Care Management (AMB) (Signed)
Chronic Care Management Pharmacy Assistant   Name: Jacqueline Cole  MRN: [id number] DOB: 1951-02-21  Reason for Encounter: Medication Adherence and Delivery Coordination    Recent office visits:  None since last CCM contact  Recent consult visits:  None since last CCM contact  Hospital visits:  None in previous 6 months  Medications: Outpatient Encounter Medications as of 12/01/2021  Medication Sig   atorvastatin (LIPITOR) 20 MG tablet Take 1 tablet (20 mg total) by mouth 2 (two) times a week.   Blood Glucose Monitoring Suppl ([place]) w/Device KIT Use to check blood sugar 2 times a day.   Continuous Blood Gluc Cole (FREESTYLE LIBRE 3 Cole) MISC Place 1 Cole on the skin every 14 days. Use to check glucose continuously   cyanocobalamin (VITAMIN B12) 500 MCG tablet Take 1 tablet (500 mcg total) by mouth daily.   empagliflozin (JARDIANCE) 10 MG TABS tablet Take 1 tablet (10 mg total) by mouth daily before breakfast.   gabapentin (NEURONTIN) 300 MG capsule TAKE TWO CAPSULES BY MOUTH EVERYDAY AT BEDTIME   glucose blood (ONETOUCH VERIO) test strip Use as instructed to check blood sugar 2 times a day.   hydrocortisone cream 1 % Apply 1 Application topically daily as needed for itching.   insulin glargine (LANTUS) 100 UNIT/ML injection Inject 0.35 mLs (35 Units total) into the skin daily.   Lancets (ONETOUCH ULTRASOFT) lancets Use as instructed to check blood sugar 2 times a day.   omeprazole (PRILOSEC) 20 MG capsule Take 1 capsule (20 mg total) by mouth daily.   Propylene Glycol (SYSTANE COMPLETE OP) Place 3 drops into both eyes 3 (three) times daily.   trolamine salicylate (ASPERCREME) 10 % cream Apply 1 Application topically as needed for muscle pain. Apply to feet   venlafaxine XR (EFFEXOR-XR) 150 MG 24 hr capsule TAKE ONE CAPSULE BY MOUTH EVERY MORNING and TAKE ONE CAPSULE BY MOUTH EVERYDAY AT BEDTIME   No facility-administered encounter medications on file  as of 12/01/2021.   BP Readings from Last 3 Encounters:  10/21/21 124/74  07/10/21 136/76  04/10/21 110/60    Lab Results  Component Value Date   HGBA1C 9.0 (H) 10/21/2021      No OVs, Consults, or hospital visits since last care coordination call / Pharmacist visit. No medication changes indicated   Last adherence delivery date:11/12/21      Patient is due for next adherence delivery on: 12/11/21  Spoke with patient on 12/01/21 reviewed medications and coordinated delivery.  This delivery to include: Adherence Packaging  30 Days  Packs: Venlafaxine ER 139m  take 1 tablet breakfast and 1 tablet bedtime Gabapentin 3068mtake 2 tablets bedtime Vitamin D3 257m take 1 tablet breakfast Vitamin B12 500m45make 1 tablet breakfast Jardiance 10mg17mke 1 tablet breakfast Atorvastatin 20mg 26m 2 times week (wednesday,Sunday breakfast)  VIAL medications: Lantus 100 unit/ml    Patient declined the following medications this month:    Any concerns about your medications? No  How often do you forget or accidentally miss a dose? Never   Is patient in packaging Yes  Any concerns or issues with your packaging? satisfied   No refill request needed.  Confirmed delivery date of 12/11/21, advised patient that pharmacy will contact them the morning of delivery.  Recent blood pressure readings are as follows:none available   Recent blood glucose readings are as follows:none available    Annual wellness visit in last year? Yes Most Recent BP  reading:124/74  If Diabetic: Most recent A1C reading:9.0 Last eye exam / retinopathy screening:2019 Last diabetic foot exam:UTD  Cycle dispensing form sent to [facility], CPP for review. Jacqueline Cole, CPP notified  Jacqueline Cole, Jacqueline Cole  [phone]

## 2021-12-01 NOTE — Progress Notes (Signed)
Chronic Care Management Pharmacy Note  12/01/2021 Name:  Jacqueline Cole MRN:  [id number] DOB:  [birth year]  Summary: CCM F/U visit -DM: A1c 9.0% (10/2021) above goal of < 8%; pt is now wearing Freestyle Libre 3 -Reviewed AGP report: 11/18/21 to 12/01/21. Sensor active: 96%  Time in range (70-180): 46% (goal > 70%)  High (>180): 54%  Low (< 70): 0% (goal < 4%)  GMI: 7.7%; Average glucose: 185 -AGP report suggests improved control compared to recent A1c; fasting BG is low and post-prandial BG up to ~300; would avoid increasing basal insulin or adding sulfonylurea given low fasting BG, avoid increasing Jardiance due to reduced glycemic effect with GFR < 45, avoid GLP-1 RA due to previous intolerance.   Recommendations: -No med changes, advised diet changes to improve post-prandial BG spikes. Repeat A1c at 3 months (Nov 2023) -Consider meglitinide or bolus insulin in future  Plan: -CCM Health Concierge will call patient monthly for medication coordination -Pharmacist follow up televisit scheduled for 1 month -PCP appt 01/25/22    Subjective: Jacqueline Cole is an 71 y.o. year old female who is a primary patient of Letvak, Berneda Rose, MD.  The CCM team was consulted for assistance with disease management and care coordination needs.    Engaged with patient face to face for follow up visit in response to provider referral for pharmacy case management and/or care coordination services.   Consent to Services:  The patient was given information about Chronic Care Management services, agreed to services, and gave verbal consent prior to initiation of services.  Please see initial visit note for detailed documentation.   Patient Care Team: Karie Schwalbe, MD as PCP - General Jacqueline Cole Garry Heater, [facility] as Pharmacist (Pharmacist)  Recent office visits: 10/21/21 Dr Alphonsus Sias OV: annual - A1c 9.0. LDL 163. Restarted atorvastatin 20 mg 2x weekly. RTC 3 months.  Recent consult visits: N/a  Hospital  visits: None in previous 6 months   Objective:  Lab Results  Component Value Date   CREATININE 1.27 (H) 10/21/2021   BUN 11 10/21/2021   GFR 42.88 (L) 10/21/2021   GFRNONAA >60 11/18/2020   GFRAA 66 10/23/2007   NA 140 10/21/2021   K 4.0 10/21/2021   CALCIUM 9.2 10/21/2021   CO2 30 10/21/2021   GLUCOSE 92 10/21/2021    Lab Results  Component Value Date/Time   HGBA1C 9.0 (H) 10/21/2021 10:17 AM   HGBA1C 8.4 (A) 07/10/2021 07:30 AM   HGBA1C 9.1 (A) 04/10/2021 08:21 AM   HGBA1C 5.5 11/16/2020 11:48 AM   GFR 42.88 (L) 10/21/2021 10:17 AM   GFR 62.73 11/13/2020 03:10 PM   MICROALBUR 1.2 10/21/2021 10:17 AM   MICROALBUR 4.0 (H) 11/26/2015 09:58 AM    Last diabetic Eye exam:  Lab Results  Component Value Date/Time   HMDIABEYEEXA Retinopathy (A) 04/25/2017 12:00 AM    Last diabetic Foot exam:  Lab Results  Component Value Date/Time   HMDIABFOOTEX done 10/21/2021 12:00 AM     Lab Results  Component Value Date   CHOL 251 (H) 10/21/2021   HDL 38.60 (L) 10/21/2021   LDLCALC 46 06/07/2019   LDLDIRECT 163.0 10/21/2021   TRIG 298.0 (H) 10/21/2021   CHOLHDL 7 10/21/2021       Latest Ref Rng & Units 10/21/2021   10:17 AM 11/18/2020    5:02 AM 11/17/2020    6:04 AM  Hepatic Function  Total Protein 6.0 - 8.3 g/dL 6.9  4.6  4.2   Albumin  3.5 - 5.2 g/dL 3.5 - 5.2 g/dL 4.0    4.0  2.8  2.5   AST 0 - 37 U/L 14  23  32   ALT 0 - 35 U/L $Remo'8  13  13   'zfrfo$ Alk Phosphatase 39 - 117 U/L 132  51  48   Total Bilirubin 0.2 - 1.2 mg/dL 0.8  1.4  1.4   Bilirubin, Direct 0.0 - 0.3 mg/dL 0.1   0.2     Lab Results  Component Value Date/Time   TSH 3.730 11/16/2020 10:21 AM   TSH 3.85 02/01/2018 08:51 AM   TSH 2.54 09/08/2012 09:36 AM   FREET4 1.55 (H) 11/16/2020 10:21 AM   FREET4 1.06 09/09/2020 10:06 AM       Latest Ref Rng & Units 10/21/2021   10:17 AM 11/18/2020    5:02 AM 11/17/2020    6:04 AM  CBC  WBC 4.0 - 10.5 K/uL 9.9  6.1  9.0   Hemoglobin 12.0 - 15.0 g/dL 14.1  10.4  10.0    Hematocrit 36.0 - 46.0 % 43.5  29.2  28.3   Platelets 150.0 - 400.0 K/uL 270.0  174  173     No results found for: "VD25OH"  Clinical ASCVD: No  The 10-year ASCVD risk score (Arnett DK, et al., 2019) is: 18.9%   Values used to calculate the score:     Age: 71 years     Sex: Female     Is Non-Hispanic African American: No     Diabetic: Yes     Tobacco smoker: No     Systolic Blood Pressure: 604 mmHg     Is BP treated: No     HDL Cholesterol: 38.6 mg/dL     Total Cholesterol: 251 mg/dL       10/21/2021    9:19 AM 02/26/2021    9:14 AM 11/13/2020    2:27 PM  Depression screen PHQ 2/9  Decreased Interest 3 3 0  Down, Depressed, Hopeless 1 3 0  PHQ - 2 Score 4 6 0  Altered sleeping  2 0  Tired, decreased energy  3 0  Change in appetite  0 0  Feeling bad or failure about yourself   1 0  Trouble concentrating  0 0  Moving slowly or fidgety/restless  1 0  Suicidal thoughts  0 0  PHQ-9 Score  13 0  Difficult doing work/chores  Somewhat difficult      Social History   Tobacco Use  Smoking Status Former   Types: Cigarettes   Quit date: 03/15/1993   Years since quitting: 28.7  Smokeless Tobacco Never   BP Readings from Last 3 Encounters:  10/21/21 124/74  07/10/21 136/76  04/10/21 110/60   Pulse Readings from Last 3 Encounters:  10/21/21 80  07/10/21 75  04/10/21 66   Wt Readings from Last 3 Encounters:  10/21/21 209 lb (94.8 kg)  07/10/21 218 lb (98.9 kg)  04/10/21 199 lb (90.3 kg)   BMI Readings from Last 3 Encounters:  10/21/21 34.78 kg/m  07/10/21 36.28 kg/m  04/10/21 33.12 kg/m    Assessment/Interventions: Review of patient past medical history, allergies, medications, health status, including review of consultants reports, laboratory and other test data, was performed as part of comprehensive evaluation and provision of chronic care management services.   SDOH:  (Social Determinants of Health) assessments and interventions performed: No SDOH  Interventions    Flowsheet Row Chronic Care Management from [facility]26[facility]2021 in [facility]  at [facility] Chronic Care Management from 03/20/2020 in [facility] [place]  SDOH Interventions    Transportation Interventions Intervention Not Indicated --  Depression Interventions/Treatment  Medication, Counseling --  Financial Strain Interventions -- Intervention Not Indicated  [Medications affordable]      SDOH Screenings   Transportation Needs: No Transportation Needs (02/26/2021)  Depression (PHQ2-9): Low Risk  (10/21/2021)  Financial Resource Strain: Low Risk  (04/03/2020)  Tobacco Use: Medium Risk (10/21/2021)    CCM Care Plan  Allergies  Allergen Reactions   Onglyza [Saxagliptin] Hives and Nausea And Vomiting   Ozempic (0.25 Or 0.5 Mg-Dose) [Semaglutide(0.25 Or 0.5mg -Dos)] Nausea And Vomiting   Liraglutide Nausea Only    Can't eat, nausea, higher sugars    Medications Reviewed Today     Reviewed by Kathyrn Sheriff, [facility] (Pharmacist) on 12/07/21 at 1305  Med List Status: <None>   Medication Order Taking? Sig Documenting Provider Last Dose Status Informant  atorvastatin (LIPITOR) 20 MG tablet [id number] Yes Take 1 tablet (20 mg total) by mouth 2 (two) times a week. Karie Schwalbe, MD Taking Active   Blood Glucose Monitoring Suppl [facility] VERIO FLEX SYSTEM) w/Device KIT [id number] Yes Use to check blood sugar 2 times a day. Carlus Pavlov, MD Taking Active   Continuous Blood Gluc Sensor (FREESTYLE LIBRE 3 SENSOR) [place] [id number] Yes Place 1 sensor on the skin every 14 days. Use to check glucose continuously Karie Schwalbe, MD Taking Active   cyanocobalamin (VITAMIN B12) 500 MCG tablet [id number] Yes Take 1 tablet (500 mcg total) by mouth daily. Karie Schwalbe, MD Taking Active   empagliflozin (JARDIANCE) 10 MG TABS tablet [id number] Yes Take 1 tablet (10 mg total) by mouth daily before breakfast. Karie Schwalbe, MD Taking Active   gabapentin (NEURONTIN)  300 MG capsule [id number] Yes TAKE TWO CAPSULES BY MOUTH EVERYDAY AT BEDTIME Karie Schwalbe, MD Taking Active   glucose blood [facility] VERIO) test strip [id number] Yes Use as instructed to check blood sugar 2 times a day. Carlus Pavlov, MD Taking Active   hydrocortisone cream 1 % [id number] Yes Apply 1 Application topically daily as needed for itching. Karie Schwalbe, MD Taking Active   insulin glargine (LANTUS) 100 UNIT/ML injection [id number] Yes Inject 0.3 mLs (30 Units total) into the skin 2 (two) times daily. Karie Schwalbe, MD Taking Active   Lancets [facility] ULTRASOFT) lancets [id number] Yes Use as instructed to check blood sugar 2 times a day. Carlus Pavlov, MD Taking Active   omeprazole (PRILOSEC) 20 MG capsule [id number] Yes Take 1 capsule (20 mg total) by mouth daily. Karie Schwalbe, MD Taking Active   Propylene Glycol [facility] COMPLETE OP) [id number] Yes Place 3 drops into both eyes 3 (three) times daily. [provider] Taking Active   trolamine salicylate (ASPERCREME) 10 % cream [id number] Yes Apply 1 Application topically as needed for muscle pain. Apply to feet Karie Schwalbe, MD Taking Active   venlafaxine XR (EFFEXOR-XR) 150 MG 24 hr capsule [id number] Yes TAKE ONE CAPSULE BY MOUTH EVERY MORNING and TAKE ONE CAPSULE BY MOUTH EVERYDAY AT BEDTIME Karie Schwalbe, MD Taking Active             Patient Active Problem List   Diagnosis Date Noted   Meningioma [facility]) 07/10/2021   Edema 12/10/2020   Memory loss 09/09/2020   Multiple fibroadenomata of both breasts 04/24/2019   MDD (major depressive disorder), recurrent episode, moderate (HCC) 08/09/2017   Osteopenia  Osteoarthritis of right knee 12/13/2016   Advance directive discussed with patient 12/13/2016   Type 2 diabetes, controlled, with peripheral neuropathy ([place]) 02/15/2012   Routine general medical examination at a health care facility 05/03/2011   Hyperlipemia 09/01/2006    Immunization  History  Administered Date(s) Administered   Fluad Quad(high Dose 65+) 01/07/2021   Influenza Split 01/14/2011   Influenza Whole 12/25/2008, 12/30/2009   Influenza-Unspecified 01/30/2014   Moderna Sars-Covid-2 Vaccination 07/20/2019, 08/17/2019, 02/26/2020   Pneumococcal Conjugate-13 12/13/2016   Pneumococcal Polysaccharide-23 12/25/2008   Td 12/01/2005, 11/26/2015    Conditions to be addressed/monitored:  Hyperlipidemia and Diabetes  Care Plan : [place]  Updates made by Charlton Haws, [place] since 12/07/2021 12:00 AM     Problem: Hyperlipidemia and Diabetes   Priority: High     Long-Range Goal: Disease mgmt   Start Date: 11/17/2021  Expected End Date: 11/21/2022  This Visit's Progress: On track  Recent Progress: On track  Priority: High  Note:   Current Barriers:  Unable to independently monitor therapeutic efficacy Unable to maintain control of Diabetes  Pharmacist Clinical Goal(s):  Patient will achieve adherence to monitoring guidelines and medication adherence to achieve therapeutic efficacy achieve improvement in diabetes as evidenced by A1c < 8% through collaboration with PharmD and provider.   Interventions: 1:1 collaboration with Venia Carbon, MD regarding development and update of comprehensive plan of care as evidenced by provider attestation and co-signature Inter-disciplinary care team collaboration (see longitudinal plan of care) Comprehensive medication review performed; medication list updated in electronic medical record  Diabetes (A1c goal <8%) -Uncontrolled  - A1c 9.0% (10/2021) above goal -Reviewed AGP report: 11/18/21 to 12/01/21. Sensor active: 96%  Time in range (70-180): 46% (goal > 70%)  High (>180): 54%  Low (< 70): 0% (goal < 4%)  GMI: 7.7%; Average glucose: 185  -Current medications: Jardiance 10 mg daily - Appropriate, Query Effective Lantus vial 30 units BID - Appropriate, Query Effective -Medications previously tried:  glipizide, Victoza (n/v), metformin, Ozempic (n/v), Onglyza (hives) -Educated on Continuous glucose monitoring; - Libre 3 training completed 11/17/21 -Reviewed CGM data: glucose control is better than recent A1c would suggest; fasting glucose at goal, post-prandial BG is high and best treated with diet changes,  -Reviewed medication options: would avoid sulfonylurea since fasting BG is already low; pt has not tolerated GLP-1 RA; increasing Jardiance is unlikely to help much given GFR < 45; meglitinides or bolus insulin are options for the future -Recommended to continue current medication, focus on diet changes to improve post-prandial BG; repeat 42-month A1c  Hyperlipidemia: (LDL goal < 100) -Uncontrolled - LDL 163 (10/2021) without statin, atorvastatin restarted this month -Current treatment: Atorvastatin 20 mg twice a week - Appropriate, Query Effective -Medications previously tried: n/a  -Educated on Cholesterol goals; Benefits of statin for ASCVD risk reduction; -Recommended to continue current medication  Patient Goals/Self-Care Activities Patient will:  - take medications as prescribed as evidenced by patient report and record review focus on medication adherence by routine check glucose via [place] 3, document, and provide at future appointments        Medication Assistance: None required.  Patient affirms current coverage meets needs.  Compliance/Adherence/Medication fill history: Care Gaps: Colonoscopy Mammogram (due 12/24/20) Eye exam (due 04/25/18)  Star-Rating Drugs: Atorvastatin - PDC 100% Jardiance - PDC 98%  Medication Access: Within the past 30 days, how often has patient missed a dose of medication? 0 Is a pillbox or other method used to improve  adherence? Yes  Factors that may affect medication adherence? no barriers identified Are meds synced by current pharmacy? Yes  Are meds delivered by current pharmacy? Yes  Does patient experience delays in picking up  medications due to transportation concerns? No   [facility] Reviewed: Is patient disadvantaged to use [facility]?: No  Current Rx insurance plan: HTA Name and location of Current pharmacy:  [facility] - [place], [place] - [address] [place] [address] Phone: [phone] Fax: [phone]  [facility] services reviewed with patient today?: Yes  30-day Packs (delivery 12/11/21) Venlafaxine ER 150mg   take 1 tablet breakfast and 1 tablet bedtime Gabapentin 300mg  take 2 tablets bedtime Vitamin D3 16mcg  take 1 tablet breakfast Vitamin B12 564mcg take 1 tablet breakfast Jardiance 10mg   take 1 tablet breakfast Atorvastatin 20mg  take 2 times week (wednesday,Sunday breakfast)  VIAL medications: Lantus 100 unit/ml   Care Plan and Follow Up Patient Decision:  Patient agrees to Care Plan and Follow-up.  Plan: Telephone follow up appointment with care management team member scheduled for:  1 month  [name], PharmD, [facility] Clinical Pharmacist [facility] [phone]

## 2021-12-07 NOTE — Patient Instructions (Signed)
Visit Information  Phone number for Pharmacist: [phone]   Goals Addressed   None     Care Plan : [place]  Updates made by Charlton Haws, [place] since 12/07/2021 12:00 AM     Problem: Hyperlipidemia and Diabetes   Priority: High     Long-Range Goal: Disease mgmt   Start Date: 11/17/2021  Expected End Date: 11/21/2022  This Visit's Progress: On track  Recent Progress: On track  Priority: High  Note:   Current Barriers:  Unable to independently monitor therapeutic efficacy Unable to maintain control of Diabetes  Pharmacist Clinical Goal(s):  Patient will achieve adherence to monitoring guidelines and medication adherence to achieve therapeutic efficacy achieve improvement in diabetes as evidenced by A1c < 8% through collaboration with PharmD and provider.   Interventions: 1:1 collaboration with Venia Carbon, MD regarding development and update of comprehensive plan of care as evidenced by provider attestation and co-signature Inter-disciplinary care team collaboration (see longitudinal plan of care) Comprehensive medication review performed; medication list updated in electronic medical record  Diabetes (A1c goal <8%) -Uncontrolled  - A1c 9.0% (10/2021) above goal -Reviewed AGP report: 11/18/21 to 12/01/21. Sensor active: 96%  Time in range (70-180): 46% (goal > 70%)  High (>180): 54%  Low (< 70): 0% (goal < 4%)  GMI: 7.7%; Average glucose: 185  -Current medications: Jardiance 10 mg daily - Appropriate, Query Effective Lantus vial 30 units BID - Appropriate, Query Effective -Medications previously tried: glipizide, Victoza (n/v), metformin, Ozempic (n/v), Onglyza (hives) -Educated on Continuous glucose monitoring; - Libre 3 training completed 11/17/21 -Reviewed CGM data: glucose control is better than recent A1c would suggest; fasting glucose at goal, post-prandial BG is high and best treated with diet changes,  -Reviewed medication options: would  avoid sulfonylurea since fasting BG is already low; pt has not tolerated GLP-1 RA; increasing Jardiance is unlikely to help much given GFR < 45; meglitinides or bolus insulin are options for the future -Recommended to continue current medication, focus on diet changes to improve post-prandial BG; repeat 68-monthA1c  Hyperlipidemia: (LDL goal < 100) -Uncontrolled - LDL 163 (10/2021) without statin, atorvastatin restarted this month -Current treatment: Atorvastatin 20 mg twice a week - Appropriate, Query Effective -Medications previously tried: n/a  -Educated on Cholesterol goals; Benefits of statin for ASCVD risk reduction; -Recommended to continue current medication  Patient Goals/Self-Care Activities Patient will:  - take medications as prescribed as evidenced by patient report and record review focus on medication adherence by routine check glucose via [place], document, and provide at future appointments       Patient verbalizes understanding of instructions and care plan provided today and agrees to view in [facility] My[facility] status and patient understanding of how to access instructions and care plan via MyChart confirmed with patient.    Telephone follow up appointment with pharmacy team member scheduled for: 1 month  LCharlene Brooke PharmD, [phone]

## 2021-12-12 DIAGNOSIS — Z794 Long term (current) use of insulin: Secondary | ICD-10-CM | POA: Diagnosis not present

## 2021-12-12 DIAGNOSIS — E1169 Type 2 diabetes mellitus with other specified complication: Secondary | ICD-10-CM | POA: Diagnosis not present

## 2021-12-12 DIAGNOSIS — E785 Hyperlipidemia, unspecified: Secondary | ICD-10-CM

## 2021-12-14 DIAGNOSIS — H2512 Age-related nuclear cataract, left eye: Secondary | ICD-10-CM | POA: Diagnosis not present

## 2021-12-14 DIAGNOSIS — H2511 Age-related nuclear cataract, right eye: Secondary | ICD-10-CM | POA: Diagnosis not present

## 2021-12-15 ENCOUNTER — Telehealth: Payer: Self-pay | Admitting: Pharmacist

## 2021-12-15 NOTE — Telephone Encounter (Signed)
Received message from patient's husband Lanny Hurst, they had some trouble reconnecting the [facility] sensor to their phone to monitor sugars.  Per Libreview, sugar monitoring has not been interrupted so it appears they got it working again.

## 2021-12-15 NOTE — Telephone Encounter (Signed)
Contacted the husband Lanny Hurst and they got the new sensor working alright.  [name], CPP notified  [name], [place]  [phone]

## 2021-12-23 DIAGNOSIS — H269 Unspecified cataract: Secondary | ICD-10-CM | POA: Diagnosis not present

## 2021-12-23 DIAGNOSIS — H2511 Age-related nuclear cataract, right eye: Secondary | ICD-10-CM | POA: Diagnosis not present

## 2021-12-28 ENCOUNTER — Telehealth: Payer: Self-pay

## 2021-12-28 NOTE — Chronic Care Management (AMB) (Signed)
    Chronic Care Management Pharmacy Assistant   Name: Jacqueline Cole  MRN: [id number] DOB: 1951/02/18  Reason for Encounter: Reminder Call  Medications: Outpatient Encounter Medications as of 12/28/2021  Medication Sig   atorvastatin (LIPITOR) 20 MG tablet Take 1 tablet (20 mg total) by mouth 2 (two) times a week.   Blood Glucose Monitoring Suppl (ONETOUCH VERIO FLEX SYSTEM) w/Device KIT Use to check blood sugar 2 times a day.   Continuous Blood Gluc Sensor (FREESTYLE LIBRE 3 SENSOR) MISC Place 1 sensor on the skin every 14 days. Use to check glucose continuously   cyanocobalamin (VITAMIN B12) 500 MCG tablet Take 1 tablet (500 mcg total) by mouth daily.   empagliflozin (JARDIANCE) 10 MG TABS tablet Take 1 tablet (10 mg total) by mouth daily before breakfast.   gabapentin (NEURONTIN) 300 MG capsule TAKE TWO CAPSULES BY MOUTH EVERYDAY AT BEDTIME   glucose blood (ONETOUCH VERIO) test strip Use as instructed to check blood sugar 2 times a day.   hydrocortisone cream 1 % Apply 1 Application topically daily as needed for itching.   insulin glargine (LANTUS) 100 UNIT/ML injection Inject 0.3 mLs (30 Units total) into the skin 2 (two) times daily.   Lancets (ONETOUCH ULTRASOFT) lancets Use as instructed to check blood sugar 2 times a day.   omeprazole (PRILOSEC) 20 MG capsule Take 1 capsule (20 mg total) by mouth daily.   Propylene Glycol (SYSTANE COMPLETE OP) Place 3 drops into both eyes 3 (three) times daily.   trolamine salicylate (ASPERCREME) 10 % cream Apply 1 Application topically as needed for muscle pain. Apply to feet   venlafaxine XR (EFFEXOR-XR) 150 MG 24 hr capsule TAKE ONE CAPSULE BY MOUTH EVERY MORNING and TAKE ONE CAPSULE BY MOUTH EVERYDAY AT BEDTIME   No facility-administered encounter medications on file as of 12/28/2021.    Darleen Crocker was contacted to remind of upcoming telephone visit with Al Corpus on 12/31/21 at 9:30. Patient was reminded to have any blood glucose  and blood pressure readings available for review at appointment.   Patient confirmed appointment.  Are you having any problems with your medications? No   Do you have any concerns you like to discuss with the pharmacist? No  CCM referral has been placed prior to visit?  Yes   Star Rating Drugs: Medication:  Last Fill: Day Supply Atorvastatin 20mg  11/09/21 30 Jardiance 10mg  11/09/21 30 Lantus   11/03/21 29  Al Corpus, CPP notified  Burt Knack, [facility] Health concierge  [phone]

## 2021-12-31 ENCOUNTER — Telehealth: Payer: Self-pay

## 2021-12-31 ENCOUNTER — Ambulatory Visit: Payer: PPO | Admitting: Pharmacist

## 2021-12-31 DIAGNOSIS — E1142 Type 2 diabetes mellitus with diabetic polyneuropathy: Secondary | ICD-10-CM

## 2021-12-31 DIAGNOSIS — E785 Hyperlipidemia, unspecified: Secondary | ICD-10-CM

## 2021-12-31 NOTE — Progress Notes (Signed)
Chronic Care Management Pharmacy Note  12/31/21 Name:  Jacqueline Cole MRN:  [id number] DOB:  25-Dec-1950  Summary: CCM F/U visit -DM: A1c 9.0% (10/2021) above goal of < 8%; pt is now wearing Freestyle Libre 3 Reviewed AGP report: 12/18/21 to 12/31/21. Sensor active: 95%  Time in range (70-180): 48% (goal > 70%)  High (>180): 52%  Low (< 70): 0% (goal < 4%)  GMI: 7.7%; Average glucose: 185 -AGP report suggests improved control compared to recent A1c; fasting BG is low-normal and post-prandial BG ~250; would avoid increasing basal insulin or adding sulfonylurea given low fasting BG, avoid GLP-1 RA due to previous intolerance; would consider increasing Jardiance if GFR improves  Recommendations: -No med changes, advised diet changes to improve post-prandial BG spikes. Repeat A1c/BMP at 3 months (Nov 2023)  Plan: -[facility]M [facility] Concierge will call patient monthly for medication coordination -Pharmacist follow up televisit scheduled for 1 month -PCP appt 01/26/22    Subjective: Jacqueline Cole is an 71 y.o. year old female who is a primary patient of Karie Schwalbe, MD.  The CCM team was consulted for assistance with disease management and care coordination needs.    Engaged with patient face to face for follow up visit in response to provider referral for pharmacy case management and/or care coordination services.   Consent to Services:  The patient was given information about Chronic Care Management services, agreed to services, and gave verbal consent prior to initiation of services.  Please see initial visit note for detailed documentation.   Patient Care Team: Karie Schwalbe, MD as PCP - General Lenice Llamas Garry Heater, [facility] as Pharmacist (Pharmacist)  Recent office visits: 10/21/21 Dr Alphonsus Sias OV: annual - A1c 9.0. LDL 163. Restarted atorvastatin 20 mg 2x weekly. RTC 3 months.  Recent consult visits: N/a  Hospital visits: None in previous 6 months   Objective:  Lab Results   Component Value Date   CREATININE 1.27 (H) 10/21/2021   BUN 11 10/21/2021   GFR 42.88 (L) 10/21/2021   GFRNONAA >60 11/18/2020   GFRAA 66 10/23/2007   NA 140 10/21/2021   K 4.0 10/21/2021   CALCIUM 9.2 10/21/2021   CO2 30 10/21/2021   GLUCOSE 92 10/21/2021    Lab Results  Component Value Date/Time   HGBA1C 9.0 (H) 10/21/2021 10:17 AM   HGBA1C 8.4 (A) 07/10/2021 07:30 AM   HGBA1C 9.1 (A) 04/10/2021 08:21 AM   HGBA1C 5.5 11/16/2020 11:48 AM   GFR 42.88 (L) 10/21/2021 10:17 AM   GFR 62.73 11/13/2020 03:10 PM   MICROALBUR 1.2 10/21/2021 10:17 AM   MICROALBUR 4.0 (H) 11/26/2015 09:58 AM    Last diabetic Eye exam:  Lab Results  Component Value Date/Time   HMDIABEYEEXA Retinopathy (A) 04/25/2017 12:00 AM    Last diabetic Foot exam:  Lab Results  Component Value Date/Time   HMDIABFOOTEX done 10/21/2021 12:00 AM     Lab Results  Component Value Date   CHOL 251 (H) 10/21/2021   HDL 38.60 (L) 10/21/2021   LDLCALC 46 06/07/2019   LDLDIRECT 163.0 10/21/2021   TRIG 298.0 (H) 10/21/2021   CHOLHDL 7 10/21/2021       Latest Ref Rng & Units 10/21/2021   10:17 AM 11/18/2020    5:02 AM 11/17/2020    6:04 AM  Hepatic Function  Total Protein 6.0 - 8.3 g/dL 6.9  4.6  4.2   Albumin 3.5 - 5.2 g/dL 3.5 - 5.2 g/dL 4.0    4.0  2.8  2.5   AST 0 - 37 U/L 14  23  32   ALT 0 - 35 U/L $Remo'8  13  13   'jEBjP$ Alk Phosphatase 39 - 117 U/L 132  51  48   Total Bilirubin 0.2 - 1.2 mg/dL 0.8  1.4  1.4   Bilirubin, Direct 0.0 - 0.3 mg/dL 0.1   0.2     Lab Results  Component Value Date/Time   TSH 3.730 11/16/2020 10:21 AM   TSH 3.85 02/01/2018 08:51 AM   TSH 2.54 09/08/2012 09:36 AM   FREET4 1.55 (H) 11/16/2020 10:21 AM   FREET4 1.06 09/09/2020 10:06 AM       Latest Ref Rng & Units 10/21/2021   10:17 AM 11/18/2020    5:02 AM 11/17/2020    6:04 AM  CBC  WBC 4.0 - 10.5 K/uL 9.9  6.1  9.0   Hemoglobin 12.0 - 15.0 g/dL 14.1  10.4  10.0   Hematocrit 36.0 - 46.0 % 43.5  29.2  28.3   Platelets 150.0 -  400.0 K/uL 270.0  174  173     No results found for: "VD25OH"  Clinical ASCVD: No  The 10-year ASCVD risk score (Arnett DK, et al., 2019) is: 18.9%   Values used to calculate the score:     Age: 30 years     Sex: Female     Is Non-Hispanic African American: No     Diabetic: Yes     Tobacco smoker: No     Systolic Blood Pressure: 563 mmHg     Is BP treated: No     HDL Cholesterol: 38.6 mg/dL     Total Cholesterol: 251 mg/dL       10/21/2021    9:19 AM 02/26/2021    9:14 AM 11/13/2020    2:27 PM  Depression screen PHQ 2/9  Decreased Interest 3 3 0  Down, Depressed, Hopeless 1 3 0  PHQ - 2 Score 4 6 0  Altered sleeping  2 0  Tired, decreased energy  3 0  Change in appetite  0 0  Feeling bad or failure about yourself   1 0  Trouble concentrating  0 0  Moving slowly or fidgety/restless  1 0  Suicidal thoughts  0 0  PHQ-9 Score  13 0  Difficult doing work/chores  Somewhat difficult      Social History   Tobacco Use  Smoking Status Former   Types: Cigarettes   Quit date: 03/15/1993   Years since quitting: 28.8  Smokeless Tobacco Never   BP Readings from Last 3 Encounters:  10/21/21 124/74  07/10/21 136/76  04/10/21 110/60   Pulse Readings from Last 3 Encounters:  10/21/21 80  07/10/21 75  04/10/21 66   Wt Readings from Last 3 Encounters:  10/21/21 209 lb (94.8 kg)  07/10/21 218 lb (98.9 kg)  04/10/21 199 lb (90.3 kg)   BMI Readings from Last 3 Encounters:  10/21/21 34.78 kg/m  07/10/21 36.28 kg/m  04/10/21 33.12 kg/m    Assessment/Interventions: Review of patient past medical history, allergies, medications, health status, including review of consultants reports, laboratory and other test data, was performed as part of comprehensive evaluation and provision of chronic care management services.   SDOH:  (Social Determinants of Health) assessments and interventions performed: No SDOH Interventions    Flowsheet Row Chronic Care Management from 02/26/2021 in  [place] [facility] [place] Management from 03/20/2020 in [place] [facility] [place]  Interventions    Transportation Interventions Intervention Not Indicated --  Depression Interventions/Treatment  Medication, Counseling --  Financial Strain Interventions -- Intervention Not Indicated  [Medications affordable]      SDOH Screenings   Transportation Needs: No Transportation Needs (02/26/2021)  Depression (PHQ2-9): Low Risk  (10/21/2021)  Financial Resource Strain: Low Risk  (04/03/2020)  Tobacco Use: Medium Risk (10/21/2021)    CCM Care Plan  Allergies  Allergen Reactions   Onglyza [Saxagliptin] Hives and Nausea And Vomiting   Ozempic (0.25 Or 0.5 Mg-Dose) [Semaglutide(0.25 Or 0.5mg -Dos)] Nausea And Vomiting   Liraglutide Nausea Only    Can't eat, nausea, higher sugars    Medications Reviewed Today     Reviewed by Charlton Haws, [facility] (Pharmacist) on 12/07/21 at 1305  Med List Status: <None>   Medication Order Taking? Sig Documenting Provider Last Dose Status Informant  atorvastatin (LIPITOR) 20 MG tablet [id number] Yes Take 1 tablet (20 mg total) by mouth 2 (two) times a week. Venia Carbon, MD Taking Active   Blood Glucose Monitoring Suppl [facility] VERIO FLEX SYSTEM) w/Device KIT [id number] Yes Use to check blood sugar 2 times a day. Philemon Kingdom, MD Taking Active   Continuous Blood Gluc Sensor (FREESTYLE LIBRE 3 SENSOR) [place] [id number] Yes Place 1 sensor on the skin every 14 days. Use to check glucose continuously Venia Carbon, MD Taking Active   cyanocobalamin (VITAMIN B12) 500 MCG tablet [id number] Yes Take 1 tablet (500 mcg total) by mouth daily. Venia Carbon, MD Taking Active   empagliflozin (JARDIANCE) 10 MG TABS tablet [id number] Yes Take 1 tablet (10 mg total) by mouth daily before breakfast. Venia Carbon, MD Taking Active   gabapentin (NEURONTIN) 300 MG capsule [id number] Yes TAKE TWO CAPSULES BY MOUTH EVERYDAY AT BEDTIME  Venia Carbon, MD Taking Active   glucose blood [facility] VERIO) test strip [id number] Yes Use as instructed to check blood sugar 2 times a day. Philemon Kingdom, MD Taking Active   hydrocortisone cream 1 % [id number] Yes Apply 1 Application topically daily as needed for itching. Venia Carbon, MD Taking Active   insulin glargine (LANTUS) 100 UNIT/ML injection [id number] Yes Inject 0.3 mLs (30 Units total) into the skin 2 (two) times daily. Venia Carbon, MD Taking Active   Lancets [facility] ULTRASOFT) lancets [id number] Yes Use as instructed to check blood sugar 2 times a day. Philemon Kingdom, MD Taking Active   omeprazole (PRILOSEC) 20 MG capsule [id number] Yes Take 1 capsule (20 mg total) by mouth daily. Venia Carbon, MD Taking Active   Propylene Glycol [facility] COMPLETE OP) [id number] Yes Place 3 drops into both eyes 3 (three) times daily. [provider] Taking Active   trolamine salicylate (ASPERCREME) 10 % cream [id number] Yes Apply 1 Application topically as needed for muscle pain. Apply to feet Venia Carbon, MD Taking Active   venlafaxine XR (EFFEXOR-XR) 150 MG 24 hr capsule [id number] Yes TAKE ONE CAPSULE BY MOUTH EVERY MORNING and TAKE ONE CAPSULE BY MOUTH EVERYDAY AT BEDTIME Venia Carbon, MD Taking Active             Patient Active Problem List   Diagnosis Date Noted   Meningioma [facility]) 07/10/2021   Edema 12/10/2020   Memory loss 09/09/2020   Multiple fibroadenomata of both breasts 04/24/2019   MDD (major depressive disorder), recurrent episode, moderate ([place]) 08/09/2017   Osteopenia    Osteoarthritis of right knee 12/13/2016   Advance directive discussed with patient 12/13/2016   Type  2 diabetes, controlled, with peripheral neuropathy ([place]) 02/15/2012   Routine general medical examination at a health care facility 05/03/2011   Hyperlipemia 09/01/2006    Immunization History  Administered Date(s) Administered   Fluad Quad(high Dose 65+)  01/07/2021   Influenza Split 01/14/2011   Influenza Whole 12/25/2008, 12/30/2009   Influenza-Unspecified 01/30/2014   Moderna Sars-Covid-2 Vaccination 07/20/2019, 08/17/2019, 02/26/2020   Pneumococcal Conjugate-13 12/13/2016   Pneumococcal Polysaccharide-23 12/25/2008   Td 12/01/2005, 11/26/2015    Conditions to be addressed/monitored:  Hyperlipidemia and Diabetes  Care Plan : [place]  Updates made by Charlton Haws, [place] since 12/31/2021 12:00 AM     Problem: Hyperlipidemia and Diabetes   Priority: High     Long-Range Goal: Disease mgmt   Start Date: 11/17/2021  Expected End Date: 11/21/2022  This Visit's Progress: On track  Recent Progress: On track  Priority: High  Note:    Current Barriers:  Unable to maintain control of Diabetes  Pharmacist Clinical Goal(s):  Patient will achieve improvement in diabetes as evidenced by A1c < 8% through collaboration with PharmD and provider.   Interventions: 1:1 collaboration with Venia Carbon, MD regarding development and update of comprehensive plan of care as evidenced by provider attestation and co-signature Inter-disciplinary care team collaboration (see longitudinal plan of care) Comprehensive medication review performed; medication list updated in electronic medical record  Diabetes (A1c goal <8%) -Uncontrolled  - A1c 9.0% (10/2021) above goal Reviewed AGP report: 12/18/21 to 12/31/21. Sensor active: 95%  Time in range (70-180): 48% (goal > 70%)  High (>180): 52%  Low (< 70): 0% (goal < 4%)  GMI: 7.7%; Average glucose: 185  -Previous AGP report: 11/18/21 to 12/01/21. Sensor active: 96%  Time in range (70-180): 46% (goal > 70%)  High (>180): 54%  Low (< 70): 0% (goal < 4%)  GMI: 7.7%; Average glucose: 185  -Current medications: Jardiance 10 mg daily - Appropriate, Query Effective Lantus vial 30 units BID - Appropriate, Query Effective -Medications previously tried: glipizide, Victoza (n/v),  metformin, Ozempic (n/v), Onglyza (hives) -Educated on Continuous glucose monitoring; - Libre 3 training completed 11/17/21 -Reviewed CGM data: glucose control is better than recent A1c would suggest; fasting glucose at goal, post-prandial BG is high and best treated with diet changes,  -Reviewed medication options: would avoid sulfonylurea since fasting BG is already low; pt has not tolerated GLP-1 RA; would consider increasing Jardiance if GFR improves (previously > 60, only recently < 45); meglitinides or bolus insulin are options for the future -Recommended to continue current medication, focus on diet changes to improve post-prandial BG; repeat A1c/BMP at next OV 11/14  Hyperlipidemia: (LDL goal < 100) -Uncontrolled - LDL 163 (10/2021) without statin, atorvastatin restarted 10/2021 -Current treatment: Atorvastatin 20 mg twice a week - Appropriate, Query Effective -Medications previously tried: n/a  -Educated on Cholesterol goals; Benefits of statin for ASCVD risk reduction; -Recommended to continue current medication; repeat lipid panel at next OV  Patient Goals/Self-Care Activities Patient will:  - take medications as prescribed as evidenced by patient report and record review focus on medication adherence by routine check glucose via [place] 3, document, and provide at future appointments         Medication Assistance: None required.  Patient affirms current coverage meets needs.  Compliance/Adherence/Medication fill history: Care Gaps: Colonoscopy Mammogram (due 12/24/20) Eye exam (due 04/25/18)  Star-Rating Drugs: Atorvastatin - PDC 100% Jardiance - PDC 99%  Medication Access: Within the past 30 days, how often has  patient missed a dose of medication? 0 Is a pillbox or other method used to improve adherence? Yes  Factors that may affect medication adherence? no barriers identified Are meds synced by current pharmacy? Yes  Are meds delivered by current pharmacy? Yes  Does  patient experience delays in picking up medications due to transportation concerns? No   [facility] Reviewed: Is patient disadvantaged to use [facility]?: No  Current Rx insurance plan: HTA Name and location of Current pharmacy:  [facility] - [place], [place] - [address] [place] [address] Phone: [phone] Fax: [phone]  [facility] services reviewed with patient today?: Yes  30-day Packs (delivery 01/12/22) Packs: Venlafaxine ER 150mg   take 1 tablet breakfast and 1 tablet bedtime Gabapentin 300mg  take 2 tablets bedtime Vitamin D3 98mcg  take 1 tablet breakfast Vitamin B12 578mcg take 1 tablet breakfast Jardiance 10mg   take 1 tablet breakfast Atorvastatin 20mg  take 2 times week (wednesday,Sunday breakfast)   Care Plan and Follow Up Patient Decision:  Patient agrees to Care Plan and Follow-up.  Plan: Telephone follow up appointment with care management team member scheduled for:  1 month  [name], PharmD, [facility] Clinical Pharmacist [place] [facility] at [facility] [phone]

## 2021-12-31 NOTE — Chronic Care Management (AMB) (Addendum)
Chronic Care Management Pharmacy Assistant   Name: Jacqueline Cole  MRN: [id number] DOB: December 14, 1950   Reason for Encounter: Medication Adherence and Delivery Coordination   Recent office visits:  None since last CCM contact  Recent consult visits:  None since last CCM contact  Hospital visits:  None in previous 6 months  Medications: Outpatient Encounter Medications as of 12/31/2021  Medication Sig   atorvastatin (LIPITOR) 20 MG tablet Take 1 tablet (20 mg total) by mouth 2 (two) times a week.   Blood Glucose Monitoring Suppl ([place]) w/Device KIT Use to check blood sugar 2 times a day.   Continuous Blood Gluc Sensor (FREESTYLE LIBRE 3 SENSOR) MISC Place 1 sensor on the skin every 14 days. Use to check glucose continuously   cyanocobalamin (VITAMIN B12) 500 MCG tablet Take 1 tablet (500 mcg total) by mouth daily.   empagliflozin (JARDIANCE) 10 MG TABS tablet Take 1 tablet (10 mg total) by mouth daily before breakfast.   gabapentin (NEURONTIN) 300 MG capsule TAKE TWO CAPSULES BY MOUTH EVERYDAY AT BEDTIME   glucose blood (ONETOUCH VERIO) test strip Use as instructed to check blood sugar 2 times a day.   hydrocortisone cream 1 % Apply 1 Application topically daily as needed for itching.   insulin glargine (LANTUS) 100 UNIT/ML injection Inject 0.3 mLs (30 Units total) into the skin 2 (two) times daily.   Lancets (ONETOUCH ULTRASOFT) lancets Use as instructed to check blood sugar 2 times a day.   omeprazole (PRILOSEC) 20 MG capsule Take 1 capsule (20 mg total) by mouth daily.   Propylene Glycol (SYSTANE COMPLETE OP) Place 3 drops into both eyes 3 (three) times daily.   trolamine salicylate (ASPERCREME) 10 % cream Apply 1 Application topically as needed for muscle pain. Apply to feet   venlafaxine XR (EFFEXOR-XR) 150 MG 24 hr capsule TAKE ONE CAPSULE BY MOUTH EVERY MORNING and TAKE ONE CAPSULE BY MOUTH EVERYDAY AT BEDTIME   No facility-administered encounter  medications on file as of 12/31/2021.   BP Readings from Last 3 Encounters:  10/21/21 124/74  07/10/21 136/76  04/10/21 110/60    Lab Results  Component Value Date   HGBA1C 9.0 (H) 10/21/2021     No OVs, Consults, or hospital visits since last care coordination call / Pharmacist visit. No medication changes indicated   Last adherence delivery date:12/11/21      Patient is due for next adherence delivery on: 01/12/22   This delivery to include: Adherence Packaging  30 Days  Packs: Venlafaxine ER 150mg   take 1 tablet breakfast and 1 tablet bedtime Gabapentin 300mg  take 2 tablets bedtime Vitamin D3 66mcg  take 1 tablet breakfast Vitamin B12 525mcg take 1 tablet breakfast Jardiance 10mg   take 1 tablet breakfast Atorvastatin 20mg  take 2 times week (wednesday,Sunday breakfast)  VIAL medications: None  Declined Omeprazole- supply on hand   Any concerns about your medications? No  How often do you forget or accidentally miss a dose? Never  Do you use a pillbox? No  Is patient in packaging Yes  Any concerns or issues with your packaging? satisfied   No refill request needed.  Confirmed delivery date of 01/12/22, advised patient that pharmacy will contact them the morning of delivery.  Recent blood pressure readings are as follows:none available    Recent blood glucose readings are as follows:none available   Annual wellness visit in last year? Yes Most Recent BP reading:124/74  If Diabetic: Most recent A1C reading:9.0 Last  eye exam / retinopathy screening:2019 Last diabetic foot exam:UTD  Cycle dispensing form sent to [facility], CPP for review.  [name], [place]  [phone]

## 2021-12-31 NOTE — Patient Instructions (Signed)
Visit Information  Phone number for Pharmacist: [phone]   Goals Addressed   None     Care Plan : [place]  Updates made by Charlton Haws, [place] since 12/31/2021 12:00 AM     Problem: Hyperlipidemia and Diabetes   Priority: High     Long-Range Goal: Disease mgmt   Start Date: 11/17/2021  Expected End Date: 11/21/2022  This Visit's Progress: On track  Recent Progress: On track  Priority: High  Note:    Current Barriers:  Unable to maintain control of Diabetes  Pharmacist Clinical Goal(s):  Patient will achieve improvement in diabetes as evidenced by A1c < 8% through collaboration with PharmD and provider.   Interventions: 1:1 collaboration with Venia Carbon, MD regarding development and update of comprehensive plan of care as evidenced by provider attestation and co-signature Inter-disciplinary care team collaboration (see longitudinal plan of care) Comprehensive medication review performed; medication list updated in electronic medical record  Diabetes (A1c goal <8%) -Uncontrolled  - A1c 9.0% (10/2021) above goal Reviewed AGP report: 12/18/21 to 12/31/21. Sensor active: 95%  Time in range (70-180): 48% (goal > 70%)  High (>180): 52%  Low (< 70): 0% (goal < 4%)  GMI: 7.7%; Average glucose: 185  -Previous AGP report: 11/18/21 to 12/01/21. Sensor active: 96%  Time in range (70-180): 46% (goal > 70%)  High (>180): 54%  Low (< 70): 0% (goal < 4%)  GMI: 7.7%; Average glucose: 185  -Current medications: Jardiance 10 mg daily - Appropriate, Query Effective Lantus vial 30 units BID - Appropriate, Query Effective -Medications previously tried: glipizide, Victoza (n/v), metformin, Ozempic (n/v), Onglyza (hives) -Educated on Continuous glucose monitoring; - Libre 3 training completed 11/17/21 -Reviewed CGM data: glucose control is better than recent A1c would suggest; fasting glucose at goal, post-prandial BG is high and best treated with diet changes,   -Reviewed medication options: would avoid sulfonylurea since fasting BG is already low; pt has not tolerated GLP-1 RA; would consider increasing Jardiance if GFR improves (previously > 60, only recently < 45); meglitinides or bolus insulin are options for the future -Recommended to continue current medication, focus on diet changes to improve post-prandial BG; repeat A1c/BMP at next OV 11/14  Hyperlipidemia: (LDL goal < 100) -Uncontrolled - LDL 163 (10/2021) without statin, atorvastatin restarted 10/2021 -Current treatment: Atorvastatin 20 mg twice a week - Appropriate, Query Effective -Medications previously tried: n/a  -Educated on Cholesterol goals; Benefits of statin for ASCVD risk reduction; -Recommended to continue current medication; repeat lipid panel at next OV  Patient Goals/Self-Care Activities Patient will:  - take medications as prescribed as evidenced by patient report and record review focus on medication adherence by routine check glucose via [place] 3, document, and provide at future appointments       Patient verbalizes understanding of instructions and care plan provided today and agrees to view in [place]. Active MyChart status and patient understanding of how to access instructions and care plan via MyChart confirmed with patient.    Telephone follow up appointment with pharmacy team member scheduled for: 1 month  [name], PharmD, [facility] Clinical Pharmacist [place] [facility] at [facility] [phone]

## 2022-01-06 DIAGNOSIS — H2512 Age-related nuclear cataract, left eye: Secondary | ICD-10-CM | POA: Diagnosis not present

## 2022-01-06 DIAGNOSIS — H269 Unspecified cataract: Secondary | ICD-10-CM | POA: Diagnosis not present

## 2022-01-13 HISTORY — PX: CATARACT EXTRACTION W/ INTRAOCULAR LENS & ANTERIOR VITRECTOMY: SHX1308

## 2022-01-25 ENCOUNTER — Ambulatory Visit: Payer: PPO | Admitting: Internal Medicine

## 2022-01-26 ENCOUNTER — Encounter: Payer: Self-pay | Admitting: Internal Medicine

## 2022-01-26 ENCOUNTER — Ambulatory Visit (INDEPENDENT_AMBULATORY_CARE_PROVIDER_SITE_OTHER): Payer: PPO | Admitting: Internal Medicine

## 2022-01-26 VITALS — BP 110/70 | HR 90 | Temp 97.5°F | Ht 65.0 in | Wt 211.0 lb

## 2022-01-26 DIAGNOSIS — N1832 Chronic kidney disease, stage 3b: Secondary | ICD-10-CM | POA: Diagnosis not present

## 2022-01-26 DIAGNOSIS — E1142 Type 2 diabetes mellitus with diabetic polyneuropathy: Secondary | ICD-10-CM

## 2022-01-26 DIAGNOSIS — Z23 Encounter for immunization: Secondary | ICD-10-CM | POA: Diagnosis not present

## 2022-01-26 LAB — POCT GLYCOSYLATED HEMOGLOBIN (HGB A1C): Hemoglobin A1C: 8.2 % — AB (ref 4.0–5.6)

## 2022-01-26 LAB — RENAL FUNCTION PANEL
Albumin: 4.2 g/dL (ref 3.5–5.2)
BUN: 11 mg/dL (ref 6–23)
CO2: 31 mEq/L (ref 19–32)
Calcium: 9.3 mg/dL (ref 8.4–10.5)
Chloride: 101 mEq/L (ref 96–112)
Creatinine, Ser: 1.42 mg/dL — ABNORMAL HIGH (ref 0.40–1.20)
GFR: 37.43 mL/min — ABNORMAL LOW (ref >60.00)
Glucose, Bld: 160 mg/dL — ABNORMAL HIGH (ref 70–99)
Phosphorus: 4.1 mg/dL (ref 2.3–4.6)
Potassium: 4 mEq/L (ref 3.5–5.1)
Sodium: 139 mEq/L (ref 135–145)

## 2022-01-26 MED ORDER — INSULIN GLARGINE 100 UNIT/ML ~~LOC~~ SOLN: SUBCUTANEOUS | 0 refills | Status: DC

## 2022-01-26 NOTE — Assessment & Plan Note (Signed)
GFR back down to 42 If still low, will refer to nephrology Is on the jardiance

## 2022-01-26 NOTE — Progress Notes (Signed)
Subjective:    Patient ID: Jacqueline Cole, female    DOB: 1950/07/31, 71 y.o.   MRN: [id number]  HPI Here for follow up of poorly controlled diabetes and CKD  Now has continuous glucose monitor Gives her more "discipline over what I eat" Didn't bring in phone--- not charged (so can't show me her numbers) Was getting 300 at night--but that was after cough syrup and drops--with sugar  No chest pain No SOB Drinking fluids well  GFR 42 last labs  Current Outpatient Medications on File Prior to Visit  Medication Sig Dispense Refill   atorvastatin (LIPITOR) 20 MG tablet Take 1 tablet (20 mg total) by mouth 2 (two) times a week. 26 tablet 3   Blood Glucose Monitoring Suppl ([place]) w/Device KIT Use to check blood sugar 2 times a day. 1 kit 0   Continuous Blood Gluc Sensor (FREESTYLE LIBRE 3 SENSOR) MISC Place 1 sensor on the skin every 14 days. Use to check glucose continuously 2 each 6   cyanocobalamin (VITAMIN B12) 500 MCG tablet Take 1 tablet (500 mcg total) by mouth daily. 90 tablet 3   empagliflozin (JARDIANCE) 10 MG TABS tablet Take 1 tablet (10 mg total) by mouth daily before breakfast. 90 tablet 3   gabapentin (NEURONTIN) 300 MG capsule TAKE TWO CAPSULES BY MOUTH EVERYDAY AT BEDTIME 180 capsule 3   glucose blood (ONETOUCH VERIO) test strip Use as instructed to check blood sugar 2 times a day. 200 each 12   hydrocortisone cream 1 % Apply 1 Application topically daily as needed for itching. 30 g 5   insulin glargine (LANTUS) 100 UNIT/ML injection Inject 0.3 mLs (30 Units total) into the skin 2 (two) times daily. 20 mL 11   Lancets (ONETOUCH ULTRASOFT) lancets Use as instructed to check blood sugar 2 times a day. 200 each 12   omeprazole (PRILOSEC) 20 MG capsule Take 1 capsule (20 mg total) by mouth daily. 90 capsule 3   Propylene Glycol (SYSTANE COMPLETE OP) Place 3 drops into both eyes 3 (three) times daily.     trolamine salicylate (ASPERCREME) 10 % cream Apply  1 Application topically as needed for muscle pain. Apply to feet 85 g 5   venlafaxine XR (EFFEXOR-XR) 150 MG 24 hr capsule TAKE ONE CAPSULE BY MOUTH EVERY MORNING and TAKE ONE CAPSULE BY MOUTH EVERYDAY AT BEDTIME 180 capsule 3   No current facility-administered medications on file prior to visit.    Allergies  Allergen Reactions   Onglyza [Saxagliptin] Hives and Nausea And Vomiting   Ozempic (0.25 Or 0.5 Mg-Dose) [Semaglutide(0.25 Or 0.14m-Dos)] Nausea And Vomiting   Liraglutide Nausea Only    Can't eat, nausea, higher sugars    Past Medical History:  Diagnosis Date   Depression    Diabetes mellitus type II    Hyperlipemia    [place]   Qualifier: Diagnosis of  By: DLelon Mast    Obesity    Osteopenia    Syncope 1998   DM diagnosis    Past Surgical History:  Procedure Laterality Date   ABDOMINAL HYSTERECTOMY  ~2005   and BSO   BREAST BIOPSY Right 01/05/2019   rt stereo bx 2 areas 1st area ribbon clip   BREAST BIOPSY Right 01/05/2019   rt stereo bx 2nd area coil clip   CATARACT EXTRACTION W/ INTRAOCULAR LENS & ANTERIOR VITRECTOMY Bilateral 01/2022   CHOLECYSTECTOMY  1975   COSMETIC SURGERY  Injury Right face as a child   ESOPHAGOGASTRODUODENOSCOPY (EGD) WITH PROPOFOL N/A 11/18/2020   Procedure: ESOPHAGOGASTRODUODENOSCOPY (EGD) WITH PROPOFOL;  Surgeon: Jonathon Bellows, MD;  Location: [facility] ENDOSCOPY;  Service: Gastroenterology;  Laterality: N/A;   VAGINAL DELIVERY     X 2    Family History  Problem Relation Age of Onset   Stroke Father        CVA   Heart disease Father        MI   Cancer Sister        breast cancer   Breast cancer Sister 68   Heart disease Brother        MI   Cancer Brother        Lung    Social History   Socioeconomic History   Marital status: Married    Spouse name: Not on file   Number of children: 2   Years of education: Not on file   Highest education level: Not on file   Occupational History    Comment: "Just up and quit" at [place] Use   Smoking status: Former    Types: Cigarettes    Quit date: 03/15/1993    Years since quitting: 28.8   Smokeless tobacco: Never  Vaping Use   Vaping Use: Never used  Substance and Sexual Activity   Alcohol use: No    Alcohol/week: 0.0 standard drinks of alcohol   Drug use: No   Sexual activity: Not on file  Other Topics Concern   Not on file  Social History Narrative   Has living will   Husband, then daughter [name], have health care POA   Would reluctantly accept resuscitation but no life prolonging measures   Social Determinants of Health   Financial Resource Strain: Low Risk  (04/03/2020)   Overall Financial Resource Strain (CARDIA)    Difficulty of Paying Living Expenses: Not very hard  Food Insecurity: Not on file  Transportation Needs: No Transportation Needs (02/26/2021)   PRAPARE - Hydrologist (Medical): No    Lack of Transportation (Non-Medical): No  Physical Activity: Not on file  Stress: Not on file  Social Connections: Not on file  Intimate Partner Violence: Not on file   Review of Systems Had cataracts done---now needs reading glasses Weight stable Sleeps okay    Objective:   Physical Exam Constitutional:      Appearance: Normal appearance.  Cardiovascular:     Rate and Rhythm: Normal rate and regular rhythm.     Pulses: Normal pulses.     Heart sounds: No murmur heard.    No gallop.  Pulmonary:     Effort: Pulmonary effort is normal.     Breath sounds: Normal breath sounds. No wheezing or rales.  Musculoskeletal:     Cervical back: Neck supple.  Lymphadenopathy:     Cervical: No cervical adenopathy.  Neurological:     Mental Status: She is alert.  Psychiatric:        Mood and Affect: Mood normal.        Behavior: Behavior normal.            Assessment & Plan:

## 2022-01-26 NOTE — Addendum Note (Signed)
Addended by: Pilar Grammes on: 01/26/2022 11:02 AM   Modules accepted: Orders

## 2022-01-26 NOTE — Assessment & Plan Note (Signed)
This is better with the CGM--down to 8.2% Insulin at 35/30, jardiance still 10

## 2022-01-27 ENCOUNTER — Other Ambulatory Visit: Payer: Self-pay | Admitting: Internal Medicine

## 2022-01-27 ENCOUNTER — Telehealth: Payer: Self-pay

## 2022-01-27 DIAGNOSIS — N1832 Chronic kidney disease, stage 3b: Secondary | ICD-10-CM

## 2022-01-27 NOTE — Telephone Encounter (Signed)
Left message to call office

## 2022-01-27 NOTE — Telephone Encounter (Signed)
-----   Message from Venia Carbon, MD sent at 01/27/2022 10:22 AM EST ----- Results released but confirm the message that the kidneys are actually a little worse--so I have gone ahead and made the referral to nephrology

## 2022-01-28 ENCOUNTER — Telehealth: Payer: Self-pay

## 2022-01-28 NOTE — Chronic Care Management (AMB) (Addendum)
Chronic Care Management Pharmacy Assistant   Name: NELLINE LIO  MRN: [id number] DOB: August 29, 1950   Reason for Encounter: Medication Adherence and Delivery Coordination   Recent office visits:  01/26/22-[name],MD(PCP)-f/u DM,CKD,new A1c 8.2, (Your kidney function (creatinine and GFR) actually got a little worse ) referral to Nephrology,f/u 3 months   Recent consult visits:  None since last CCM contact  Hospital visits:  None in previous 6 months  Medications: Outpatient Encounter Medications as of 01/28/2022  Medication Sig   atorvastatin (LIPITOR) 20 MG tablet Take 1 tablet (20 mg total) by mouth 2 (two) times a week.   Blood Glucose Monitoring Suppl ([place]) w/Device KIT Use to check blood sugar 2 times a day.   Continuous Blood Gluc Sensor (FREESTYLE LIBRE 3 SENSOR) MISC Place 1 sensor on the skin every 14 days. Use to check glucose continuously   cyanocobalamin (VITAMIN B12) 500 MCG tablet Take 1 tablet (500 mcg total) by mouth daily.   empagliflozin (JARDIANCE) 10 MG TABS tablet Take 1 tablet (10 mg total) by mouth daily before breakfast.   gabapentin (NEURONTIN) 300 MG capsule TAKE TWO CAPSULES BY MOUTH EVERYDAY AT BEDTIME   glucose blood (ONETOUCH VERIO) test strip Use as instructed to check blood sugar 2 times a day.   hydrocortisone cream 1 % Apply 1 Application topically daily as needed for itching.   insulin glargine (LANTUS) 100 UNIT/ML injection 35 units in AM, 30 in PM   Lancets (ONETOUCH ULTRASOFT) lancets Use as instructed to check blood sugar 2 times a day.   omeprazole (PRILOSEC) 20 MG capsule Take 1 capsule (20 mg total) by mouth daily.   Propylene Glycol (SYSTANE COMPLETE OP) Place 3 drops into both eyes 3 (three) times daily.   trolamine salicylate (ASPERCREME) 10 % cream Apply 1 Application topically as needed for muscle pain. Apply to feet   venlafaxine XR (EFFEXOR-XR) 150 MG 24 hr capsule TAKE ONE CAPSULE BY MOUTH EVERY  MORNING and TAKE ONE CAPSULE BY MOUTH EVERYDAY AT BEDTIME   No facility-administered encounter medications on file as of 01/28/2022.   BP Readings from Last 3 Encounters:  01/26/22 110/70  10/21/21 124/74  07/10/21 136/76    Lab Results  Component Value Date   HGBA1C 8.2 (A) 01/26/2022      Recent OV, Consult or Hospital visit:  01/26/22   routine f/u PCP No medication changes indicated   Last adherence delivery date:01/12/22      Patient is due for next adherence delivery on: 02/10/22  Spoke with patient on 01/28/22 reviewed medications and coordinated delivery.  This delivery to include: Adherence Packaging  30 Days  Packs: Venlafaxine ER 143m  take 1 tablet breakfast and 1 tablet bedtime Gabapentin 3055mtake 2 tablets bedtime Vitamin D3 2522m take 1 tablet breakfast Vitamin B12 500m49make 1 tablet breakfast Jardiance 10mg67mke 1 tablet breakfast Atorvastatin 20mg 40m 2 times week (wednesday,Sunday breakfast)  VIAL medications: none   Patient declined the following medications this month: Omeprazole- supply on hand     Any concerns about your medications? No  How often do you forget or accidentally miss a dose? Never  Do you use a pillbox? No  Is patient in packaging Yes  Any concerns or issues with your packaging?satisfied   No refill request needed.  Confirmed delivery date of 02/10/22, advised patient that pharmacy will contact them the morning of delivery.  Recent blood pressure readings are as follows:none available  Recent blood glucose readings are as follows:none available   Annual wellness visit in last year? Yes Most Recent BP reading: 124/74  If Diabetic: Most recent A1C reading: 8.2 Last eye exam / retinopathy screening:2019 Last diabetic foot exam:UTD  Cycle dispensing form sent to Margaretmary Dys, PTM for review.  [name], CPP notified   appt : 02/02/22 phone call   [name], [place]   [phone]

## 2022-02-02 ENCOUNTER — Telehealth: Payer: PPO

## 2022-02-02 ENCOUNTER — Telehealth: Payer: Self-pay | Admitting: Pharmacist

## 2022-02-02 NOTE — Telephone Encounter (Signed)
  Chronic Care Management   Outreach Note  02/02/2022 Name: Jacqueline Cole MRN: [id number] DOB: 1950-06-03  Referred by: Venia Carbon, MD  Patient had a phone appointment scheduled with clinical pharmacist today.  An unsuccessful telephone outreach was attempted today. The patient was referred to the pharmacist for assistance with medications, care management and care coordination.   Patient will NOT be penalized in any way for missing a CCM appointment. The no-show fee does not apply.  If possible, a message was left to return call to: [phone] or to [facility].  [name], PharmD, BCACP Clinical Pharmacist [facility] [phone]

## 2022-02-02 NOTE — Progress Notes (Deleted)
Chronic Care Management Pharmacy Note  02/02/22 Name:  Jacqueline Cole MRN:  [id number] DOB:  [birth year]  Summary: CCM F/U visit -DM: A1c 8.2% (01/2022) improved from 9%; pt is now wearing Freestyle Libre 3 Reviewed AGP report: 01/20/22 to 02/02/22. Sensor active: 89%  Time in range (70-180): 41% (goal > 70%)  High (180-250): 36%  Very high (>250): 23%  Low (< 70): 0% (goal < 4%)  GMI: 8.0%; Average glucose: 197 - fasting BG is low-normal and post-prandial BG ~250; would avoid increasing basal insulin or adding sulfonylurea given low fasting BG, avoid GLP-1 RA due to previous intolerance; would consider increasing Jardiance if GFR improves  Recommendations: -No med changes, advised diet changes to improve post-prandial BG spikes  Plan: -[place] will call patient monthly for medication coordination -Pharmacist follow up televisit scheduled for 1 month    Subjective: Jacqueline Cole is an 71 y.o. year old female who is a primary patient of Letvak, Theophilus Kinds, MD.  The CCM team was consulted for assistance with disease management and care coordination needs.    Engaged with patient face to face for follow up visit in response to provider referral for pharmacy case management and/or care coordination services.   Consent to Services:  The patient was given information about Chronic Care Management services, agreed to services, and gave verbal consent prior to initiation of services.  Please see initial visit note for detailed documentation.   Patient Care Team: Venia Carbon, MD as PCP - General Lenord Fellers Cleaster Corin, [facility] as Pharmacist (Pharmacist)  Recent office visits: 01/26/22 Dr Silvio Pate OV: f/u - A1c 8.2%; GFR 42 - referred to nephrology.  10/21/21 Dr Silvio Pate OV: annual - A1c 9.0. LDL 163. Restarted atorvastatin 20 mg 2x weekly. RTC 3 months.  Recent consult visits: [facility] visits: None in previous 6 months   Objective:  Lab Results  Component Value  Date   CREATININE 1.42 (H) 01/26/2022   BUN 11 01/26/2022   GFR 37.43 (L) 01/26/2022   GFRNONAA >60 11/18/2020   GFRAA 66 10/23/2007   NA 139 01/26/2022   K 4.0 01/26/2022   CALCIUM 9.3 01/26/2022   CO2 31 01/26/2022   GLUCOSE 160 (H) 01/26/2022    Lab Results  Component Value Date/Time   HGBA1C 8.2 (A) 01/26/2022 10:59 AM   HGBA1C 9.0 (H) 10/21/2021 10:17 AM   HGBA1C 8.4 (A) 07/10/2021 07:30 AM   HGBA1C 5.5 11/16/2020 11:48 AM   GFR 37.43 (L) 01/26/2022 09:06 AM   GFR 42.88 (L) 10/21/2021 10:17 AM   MICROALBUR 1.2 10/21/2021 10:17 AM   MICROALBUR 4.0 (H) 11/26/2015 09:58 AM    Last diabetic Eye exam:  Lab Results  Component Value Date/Time   HMDIABEYEEXA Retinopathy (A) 04/25/2017 12:00 AM    Last diabetic Foot exam:  Lab Results  Component Value Date/Time   HMDIABFOOTEX done 10/21/2021 12:00 AM     Lab Results  Component Value Date   CHOL 251 (H) 10/21/2021   HDL 38.60 (L) 10/21/2021   LDLCALC 46 06/07/2019   LDLDIRECT 163.0 10/21/2021   TRIG 298.0 (H) 10/21/2021   CHOLHDL 7 10/21/2021       Latest Ref Rng & Units 01/26/2022    9:06 AM 10/21/2021   10:17 AM 11/18/2020    5:02 AM  Hepatic Function  Total Protein 6.0 - 8.3 g/dL  6.9  4.6   Albumin 3.5 - 5.2 g/dL 4.2  4.0    4.0  2.8  AST 0 - 37 U/L  14  23   ALT 0 - 35 U/L  8  13   Alk Phosphatase 39 - 117 U/L  132  51   Total Bilirubin 0.2 - 1.2 mg/dL  0.8  1.4   Bilirubin, Direct 0.0 - 0.3 mg/dL  0.1      Lab Results  Component Value Date/Time   TSH 3.730 11/16/2020 10:21 AM   TSH 3.85 02/01/2018 08:51 AM   TSH 2.54 09/08/2012 09:36 AM   FREET4 1.55 (H) 11/16/2020 10:21 AM   FREET4 1.06 09/09/2020 10:06 AM       Latest Ref Rng & Units 10/21/2021   10:17 AM 11/18/2020    5:02 AM 11/17/2020    6:04 AM  CBC  WBC 4.0 - 10.5 K/uL 9.9  6.1  9.0   Hemoglobin 12.0 - 15.0 g/dL 14.1  10.4  10.0   Hematocrit 36.0 - 46.0 % 43.5  29.2  28.3   Platelets 150.0 - 400.0 K/uL 270.0  174  173     No results  found for: "VD25OH"  Clinical ASCVD: No  The 10-year ASCVD risk score (Arnett DK, et al., 2019) is: 15.2%   Values used to calculate the score:     Age: 71 years     Sex: Female     Is Non-Hispanic African American: No     Diabetic: Yes     Tobacco smoker: No     Systolic Blood Pressure: 390 mmHg     Is BP treated: No     HDL Cholesterol: 38.6 mg/dL     Total Cholesterol: 251 mg/dL       10/21/2021    9:19 AM 02/26/2021    9:14 AM 11/13/2020    2:27 PM  Depression screen PHQ 2/9  Decreased Interest 3 3 0  Down, Depressed, Hopeless 1 3 0  PHQ - 2 Score 4 6 0  Altered sleeping  2 0  Tired, decreased energy  3 0  Change in appetite  0 0  Feeling bad or failure about yourself   1 0  Trouble concentrating  0 0  Moving slowly or fidgety/restless  1 0  Suicidal thoughts  0 0  PHQ-9 Score  13 0  Difficult doing work/chores  Somewhat difficult      Social History   Tobacco Use  Smoking Status Former   Types: Cigarettes   Quit date: 03/15/1993   Years since quitting: 28.9  Smokeless Tobacco Never   BP Readings from Last 3 Encounters:  01/26/22 110/70  10/21/21 124/74  07/10/21 136/76   Pulse Readings from Last 3 Encounters:  01/26/22 90  10/21/21 80  07/10/21 75   Wt Readings from Last 3 Encounters:  01/26/22 211 lb (95.7 kg)  10/21/21 209 lb (94.8 kg)  07/10/21 218 lb (98.9 kg)   BMI Readings from Last 3 Encounters:  01/26/22 35.11 kg/m  10/21/21 34.78 kg/m  07/10/21 36.28 kg/m    Assessment/Interventions: Review of patient past medical history, allergies, medications, health status, including review of consultants reports, laboratory and other test data, was performed as part of comprehensive evaluation and provision of chronic care management services.   SDOH:  (Social Determinants of Health) assessments and interventions performed: No SDOH Interventions    Flowsheet Row Chronic Care Management from 02/26/2021 in [place] at [place] Management from 03/20/2020 in [place] at [place]  SDOH Interventions    Transportation Interventions Intervention Not  Indicated --  Depression Interventions/Treatment  Medication, Counseling --  Financial Strain Interventions -- Intervention Not Indicated  [Medications affordable]      SDOH Screenings   Transportation Needs: No Transportation Needs (02/26/2021)  Depression (PHQ2-9): Low Risk  (10/21/2021)  Financial Resource Strain: Low Risk  (04/03/2020)  Tobacco Use: Medium Risk (01/26/2022)    CCM Care Plan  Allergies  Allergen Reactions   Onglyza [Saxagliptin] Hives and Nausea And Vomiting   Ozempic (0.25 Or 0.5 Mg-Dose) [Semaglutide(0.25 Or 0.29m-Dos)] Nausea And Vomiting   Liraglutide Nausea Only    Can't eat, nausea, higher sugars    Medications Reviewed Today     Reviewed by LVenia Carbon MD (Physician) on 01/26/22 at [phone] Med List Status: <None>   Medication Order Taking? Sig Documenting Provider Last Dose Status Informant  atorvastatin (LIPITOR) 20 MG tablet [id number] Take 1 tablet (20 mg total) by mouth 2 (two) times a week. LVenia Carbon MD Taking Active   Blood Glucose Monitoring Suppl ([facility] FLEX SYSTEM) w/Device KIT [id number] Use to check blood sugar 2 times a day. GPhilemon Kingdom MD Taking Active   Continuous Blood Gluc Sensor (FREESTYLE LIBRE 3 SENSOR) [id number] Place 1 sensor on the skin every 14 days. Use to check glucose continuously LVenia Carbon MD Taking Active   cyanocobalamin (VITAMIN B12) 500 MCG tablet [id number] Take 1 tablet (500 mcg total) by mouth daily. LVenia Carbon MD Taking Active   empagliflozin (JARDIANCE) 10 MG TABS tablet [id number] Take 1 tablet (10 mg total) by mouth daily before breakfast. LVenia Carbon MD Taking Active   gabapentin (NEURONTIN) 300 MG capsule [id number] TAKE TWO CAPSULES BY MOUTH EVERYDAY AT BEDTIME LVenia Carbon MD Taking Active    glucose blood ([facility]) test strip [id number] Use as instructed to check blood sugar 2 times a day. GPhilemon Kingdom MD Taking Active   hydrocortisone cream 1 % [id number] Apply 1 Application topically daily as needed for itching. LVenia Carbon MD Taking Active   insulin glargine (LANTUS) 100 UNIT/ML injection [id number] Inject 0.3 mLs (30 Units total) into the skin 2 (two) times daily. LVenia Carbon MD Taking Active   Lancets ([facility]) lancets [id number] Use as instructed to check blood sugar 2 times a day. GPhilemon Kingdom MD Taking Active   omeprazole (PRILOSEC) 20 MG capsule [id number] Take 1 capsule (20 mg total) by mouth daily. LVenia Carbon MD Taking Active   Propylene Glycol ([facility]) [id number] Place 3 drops into both eyes 3 (three) times daily. [provider] Taking Active   trolamine salicylate (ASPERCREME) 10 % cream [id number] Apply 1 Application topically as needed for muscle pain. Apply to feet LVenia Carbon MD Taking Active   venlafaxine XR (EFFEXOR-XR) 150 MG 24 hr capsule [id number] TAKE ONE CAPSULE BY MOUTH EVERY MORNING and TAKE ONE CAPSULE BY MOUTH EVERYDAY AT BEDTIME LVenia Carbon MD Taking Active             Patient Active Problem List   Diagnosis Date Noted   Meningioma ([facility] 07/10/2021   Edema 12/10/2020   Stage 3b chronic kidney disease ([place] 11/05/2020   Memory loss 09/09/2020   Multiple fibroadenomata of both breasts 04/24/2019   MDD (major depressive disorder), recurrent episode, moderate ([place] 08/09/2017   Osteopenia    Osteoarthritis of right knee 12/13/2016   Advance directive discussed with patient 12/13/2016  Type 2 diabetes, controlled, with peripheral neuropathy ([place]) 02/15/2012   Routine general medical examination at a health care facility 05/03/2011   Hyperlipemia 09/01/2006    Immunization History  Administered Date(s) Administered   Fluad Quad(high  Dose 65+) 01/07/2021, 01/26/2022   Influenza Split 01/14/2011   Influenza Whole 12/25/2008, 12/30/2009   Influenza-Unspecified 01/30/2014   Moderna Sars-Covid-2 Vaccination 07/20/2019, 08/17/2019, 02/26/2020   Pneumococcal Conjugate-13 12/13/2016   Pneumococcal Polysaccharide-23 12/25/2008   Td 12/01/2005, 11/26/2015    Conditions to be addressed/monitored:  Hyperlipidemia and Diabetes  There are no care plans that you recently modified to display for this patient.       Medication Assistance: None required.  Patient affirms current coverage meets needs.  Compliance/Adherence/Medication fill history: Care Gaps: Colonoscopy Mammogram (due 12/24/20) Eye exam (due 04/25/18)  Star-Rating Drugs: Atorvastatin - PDC 100% Jardiance - PDC 99%  Medication Access: Within the past 30 days, how often has patient missed a dose of medication? 0 Is a pillbox or other method used to improve adherence? Yes  Factors that may affect medication adherence? no barriers identified Are meds synced by current pharmacy? Yes  Are meds delivered by current pharmacy? Yes  Does patient experience delays in picking up medications due to transportation concerns? No   [facility] Reviewed: Is patient disadvantaged to use [facility]?: No  Current Rx insurance plan: HTA Name and location of Current pharmacy:  [facility] - [place], [place] - [address] [place] [address] Phone: [phone] Fax: [phone]  [facility] services reviewed with patient today?: Yes  30-day Packs (delivery 02/10/22) Venlafaxine ER 173m  take 1 tablet breakfast and 1 tablet bedtime Gabapentin 3020mtake 2 tablets bedtime Vitamin D3 2552m take 1 tablet breakfast Vitamin B12 500m16make 1 tablet breakfast Jardiance 10mg64mke 1 tablet breakfast Atorvastatin 20mg 9m 2 times week (wednesday,Sunday breakfast)   Care Plan and Follow Up  Patient Decision:  Patient agrees to Care Plan and Follow-up.  Plan: Telephone follow up appointment with care management team member scheduled for:  1 month  LindseCharlene BrookemD, BCACP Clinical Pharmacist [phone] Barriers:  Unable to maintain control of Diabetes  Pharmacist Clinical Goal(s):  Patient will achieve improvement in diabetes as evidenced by A1c < 8% through collaboration with PharmD and provider.   Interventions: 1:1 collaboration with LetvakVenia Carbonegarding development and update of comprehensive plan of care as evidenced by provider attestation and co-signature Inter-disciplinary care team collaboration (see longitudinal plan of care) Comprehensive medication review performed; medication list updated in electronic medical record  Diabetes (A1c goal <8%) -Uncontrolled  - A1c 9.0% (10/2021) above goal Reviewed AGP report: 01/20/22 to 02/02/22. Sensor active: 89%  Time in range (70-180): 41% (goal > 70%)  High (180-250): 36%  Very high (>250): 23%  Low (< 70): 0% (goal < 4%)  GMI: 8.0%; Average glucose: 197  Previous AGP report: 12/18/21 to 12/31/21. Sensor active: 95%  Time in range (70-180): 48% (goal > 70%)  High (>180): 52%  Low (< 70): 0% (goal < 4%)  GMI: 7.7%; Average glucose: 185  -Current medications: Jardiance 10 mg daily - Appropriate, Query Effective Lantus vial 30 units BID - Appropriate, Query Effective -Medications previously tried: glipizide, Victoza (n/v), metformin, Ozempic (n/v), Onglyza (hives) -Educated on Continuous glucose monitoring; - Libre 3 training completed 11/17/21 -Reviewed CGM data: glucose control is better  than recent A1c would suggest; fasting glucose at goal, post-prandial BG is high and best treated with diet changes,  -Reviewed medication options: would avoid sulfonylurea since fasting BG is already low; pt has not tolerated GLP-1 RA; would consider increasing  Jardiance if GFR improves (previously > 60, only recently < 45); meglitinides or bolus insulin are options for the future -Recommended to continue current medication, focus on diet changes to improve post-prandial BG; repeat A1c/BMP at next OV 11/14  Hyperlipidemia: (LDL goal < 100) -Uncontrolled - LDL 163 (10/2021) without statin, atorvastatin restarted 10/2021 -Current treatment: Atorvastatin 20 mg twice a week - Appropriate, Query Effective -Medications previously tried: n/a  -Educated on Cholesterol goals; Benefits of statin for ASCVD risk reduction; -Recommended to continue current medication; repeat lipid panel at next OV  Chronic Kidney Disease Stage 3b  -All medications assessed for renal dosing and appropriateness in chronic kidney disease. -{CCMPHARMDINTERVENTION:25122}  Patient Goals/Self-Care Activities Patient will:  - take medications as prescribed as evidenced by patient report and record review focus on medication adherence by routine check glucose via [place] 3, document, and provide at future appointments

## 2022-02-03 NOTE — Telephone Encounter (Signed)
Left 2nd message. I am going to send a MyChart message as well.

## 2022-02-09 ENCOUNTER — Telehealth: Payer: Self-pay

## 2022-02-09 ENCOUNTER — Ambulatory Visit: Payer: PPO | Admitting: Pharmacist

## 2022-02-09 ENCOUNTER — Telehealth: Payer: Self-pay | Admitting: Pharmacist

## 2022-02-09 DIAGNOSIS — N1832 Chronic kidney disease, stage 3b: Secondary | ICD-10-CM

## 2022-02-09 DIAGNOSIS — E785 Hyperlipidemia, unspecified: Secondary | ICD-10-CM

## 2022-02-09 DIAGNOSIS — E1142 Type 2 diabetes mellitus with diabetic polyneuropathy: Secondary | ICD-10-CM

## 2022-02-09 NOTE — Progress Notes (Signed)
Chronic Care Management Pharmacy Note  02/09/22 Name:  Jacqueline Cole MRN:  [id number] DOB:  [birth year]  Summary: CCM F/U visit -DM: A1c 8.2% (01/2022) improved from 9%; pt is wearing Freestyle Libre 3. She reports it is helpful for alerting her when glucose is high. Reviewed AGP report: 01/27/22 to 02/09/22. Sensor active: 95%  Time in range (70-180): 40% (goal > 70%)  High (180-250): 38%  Very high (>250): 22%  Low (< 70): 0% (goal < 4%)  GMI: 8.0%; Average glucose: 198 -Fasting BG is low-normal and post-prandial BG ~250; would avoid increasing basal insulin or adding sulfonylurea given low fasting BG, avoid GLP-1 RA due to previous intolerance; would consider increasing Jardiance if GFR improves -Pt has scheduled nephrology appt for Feb 2024  -Discussed screenings - mammogram, colonoscopy declined. She reports she has had diabetic eye exam this year.  Recommendations: -No med changes, advised diet changes to improve post-prandial BG spikes  Plan: -[place] will call patient monthly for medication coordination -Pharmacist follow up televisit scheduled for 3 months -Nephrology appt 04/23/22 (earliest available)    Subjective: Jacqueline Cole is an 71 y.o. year old female who is a primary patient of [place], Theophilus Kinds, MD.  The CCM team was consulted for assistance with disease management and care coordination needs.    Engaged with patient face to face for follow up visit in response to provider referral for pharmacy case management and/or care coordination services.   Consent to Services:  The patient was given information about Chronic Care Management services, agreed to services, and gave verbal consent prior to initiation of services.  Please see initial visit note for detailed documentation.   Patient Care Team: Venia Carbon, MD as PCP - General Lenord Fellers Cleaster Corin, [facility] as Pharmacist (Pharmacist)  Recent office visits: 01/26/22 Dr Silvio Pate OV: f/u - A1c  8.2%; GFR 42 - referred to nephrology.  10/21/21 Dr Silvio Pate OV: annual - A1c 9.0. LDL 163. Restarted atorvastatin 20 mg 2x weekly. RTC 3 months.  Recent consult visits: [facility] visits: None in previous 6 months   Objective:  Lab Results  Component Value Date   CREATININE 1.42 (H) 01/26/2022   BUN 11 01/26/2022   GFR 37.43 (L) 01/26/2022   GFRNONAA >60 11/18/2020   GFRAA 66 10/23/2007   NA 139 01/26/2022   K 4.0 01/26/2022   CALCIUM 9.3 01/26/2022   CO2 31 01/26/2022   GLUCOSE 160 (H) 01/26/2022    Lab Results  Component Value Date/Time   HGBA1C 8.2 (A) 01/26/2022 10:59 AM   HGBA1C 9.0 (H) 10/21/2021 10:17 AM   HGBA1C 8.4 (A) 07/10/2021 07:30 AM   HGBA1C 5.5 11/16/2020 11:48 AM   GFR 37.43 (L) 01/26/2022 09:06 AM   GFR 42.88 (L) 10/21/2021 10:17 AM   MICROALBUR 1.2 10/21/2021 10:17 AM   MICROALBUR 4.0 (H) 11/26/2015 09:58 AM    Last diabetic Eye exam:  Lab Results  Component Value Date/Time   HMDIABEYEEXA Retinopathy (A) 04/25/2017 12:00 AM    Last diabetic Foot exam:  Lab Results  Component Value Date/Time   HMDIABFOOTEX done 10/21/2021 12:00 AM     Lab Results  Component Value Date   CHOL 251 (H) 10/21/2021   HDL 38.60 (L) 10/21/2021   LDLCALC 46 06/07/2019   LDLDIRECT 163.0 10/21/2021   TRIG 298.0 (H) 10/21/2021   CHOLHDL 7 10/21/2021       Latest Ref Rng & Units 01/26/2022    9:06 AM 10/21/2021  10:17 AM 11/18/2020    5:02 AM  Hepatic Function  Total Protein 6.0 - 8.3 g/dL  6.9  4.6   Albumin 3.5 - 5.2 g/dL 4.2  4.0    4.0  2.8   AST 0 - 37 U/L  14  23   ALT 0 - 35 U/L  8  13   Alk Phosphatase 39 - 117 U/L  132  51   Total Bilirubin 0.2 - 1.2 mg/dL  0.8  1.4   Bilirubin, Direct 0.0 - 0.3 mg/dL  0.1      Lab Results  Component Value Date/Time   TSH 3.730 11/16/2020 10:21 AM   TSH 3.85 02/01/2018 08:51 AM   TSH 2.54 09/08/2012 09:36 AM   FREET4 1.55 (H) 11/16/2020 10:21 AM   FREET4 1.06 09/09/2020 10:06 AM       Latest Ref Rng &  Units 10/21/2021   10:17 AM 11/18/2020    5:02 AM 11/17/2020    6:04 AM  CBC  WBC 4.0 - 10.5 K/uL 9.9  6.1  9.0   Hemoglobin 12.0 - 15.0 g/dL 14.1  10.4  10.0   Hematocrit 36.0 - 46.0 % 43.5  29.2  28.3   Platelets 150.0 - 400.0 K/uL 270.0  174  173     No results found for: "VD25OH"  Clinical ASCVD: No  The 10-year ASCVD risk score (Arnett DK, et al., 2019) is: 15.2%   Values used to calculate the score:     Age: 70 years     Sex: Female     Is Non-Hispanic African American: No     Diabetic: Yes     Tobacco smoker: No     Systolic Blood Pressure: 161 mmHg     Is BP treated: No     HDL Cholesterol: 38.6 mg/dL     Total Cholesterol: 251 mg/dL       10/21/2021    9:19 AM 02/26/2021    9:14 AM 11/13/2020    2:27 PM  Depression screen PHQ 2/9  Decreased Interest 3 3 0  Down, Depressed, Hopeless 1 3 0  PHQ - 2 Score 4 6 0  Altered sleeping  2 0  Tired, decreased energy  3 0  Change in appetite  0 0  Feeling bad or failure about yourself   1 0  Trouble concentrating  0 0  Moving slowly or fidgety/restless  1 0  Suicidal thoughts  0 0  PHQ-9 Score  13 0  Difficult doing work/chores  Somewhat difficult      Social History   Tobacco Use  Smoking Status Former   Types: Cigarettes   Quit date: 03/15/1993   Years since quitting: 28.9  Smokeless Tobacco Never   BP Readings from Last 3 Encounters:  01/26/22 110/70  10/21/21 124/74  07/10/21 136/76   Pulse Readings from Last 3 Encounters:  01/26/22 90  10/21/21 80  07/10/21 75   Wt Readings from Last 3 Encounters:  01/26/22 211 lb (95.7 kg)  10/21/21 209 lb (94.8 kg)  07/10/21 218 lb (98.9 kg)   BMI Readings from Last 3 Encounters:  01/26/22 35.11 kg/m  10/21/21 34.78 kg/m  07/10/21 36.28 kg/m    Assessment/Interventions: Review of patient past medical history, allergies, medications, health status, including review of consultants reports, laboratory and other test data, was performed as part of comprehensive  evaluation and provision of chronic care management services.   SDOH:  (Social Determinants of Health) assessments and interventions performed:  No SDOH Interventions    Flowsheet Row Chronic Care Management from 02/26/2021 in [place] at [place] Management from 03/20/2020 in [place] at [place] Interventions    Transportation Interventions Intervention Not Indicated --  Depression Interventions/Treatment  Medication, Counseling --  Financial Strain Interventions -- Intervention Not Indicated  [Medications affordable]      SDOH Screenings   Transportation Needs: No Transportation Needs (02/26/2021)  Depression (PHQ2-9): Low Risk  (10/21/2021)  Financial Resource Strain: Low Risk  (04/03/2020)  Tobacco Use: Medium Risk (01/26/2022)    CCM Care Plan  Allergies  Allergen Reactions   Onglyza [Saxagliptin] Hives and Nausea And Vomiting   Ozempic (0.25 Or 0.5 Mg-Dose) [Semaglutide(0.25 Or 0.70m-Dos)] Nausea And Vomiting   Liraglutide Nausea Only    Can't eat, nausea, higher sugars    Medications Reviewed Today     Reviewed by FCharlton Haws [facility](Pharmacist) on 02/09/22 at 1211  Med List Status: <None>   Medication Order Taking? Sig Documenting Provider Last Dose Status Informant  atorvastatin (LIPITOR) 20 MG tablet [id number] Take 1 tablet (20 mg total) by mouth 2 (two) times a week. LVenia Carbon MD Taking Active   Blood Glucose Monitoring Suppl ([facility] Center[facility] FLEX SYSTEM) w/Device KIT [id number] Use to check blood sugar 2 times a day. GPhilemon Kingdom MD Taking Active   Continuous Blood Gluc Sensor (FREESTYLE LIBRE 3 SENSOR) [id number] Place 1 sensor on the skin every 14 days. Use to check glucose continuously LVenia Carbon MD Taking Active   cyanocobalamin (VITAMIN B12) 500 MCG tablet [id number] Take 1 tablet (500 mcg total) by mouth daily. LVenia Carbon MD Taking Active   empagliflozin  (JARDIANCE) 10 MG TABS tablet [id number] Take 1 tablet (10 mg total) by mouth daily before breakfast. LVenia Carbon MD Taking Active   gabapentin (NEURONTIN) 300 MG capsule [id number] TAKE TWO CAPSULES BY MOUTH EVERYDAY AT BEDTIME LVenia Carbon MD Taking Active   glucose blood ([facility] LPVERIO) test strip [id number] Use as instructed to check blood sugar 2 times a day. GPhilemon Kingdom MD Taking Active   hydrocortisone cream 1 % [id number] Apply 1 Application topically daily as needed for itching. LVenia Carbon MD Taking Active   insulin glargine (LANTUS) 100 UNIT/ML injection [id number] 35 units in AM, 30 in PM LViviana SimplerI, MD Taking Active   Lancets ([facility]TRASOFT) lancets [id number] Use as instructed to check blood sugar 2 times a day. GPhilemon Kingdom MD Taking Active   omeprazole (PRILOSEC) 20 MG capsule [id number] Take 1 capsule (20 mg total) by mouth daily. LVenia Carbon MD Taking Active   Propylene Glycol ([facility]PLETE OP) [id number] Place 3 drops into both eyes 3 (three) times daily. [provider] Taking Active   trolamine salicylate (ASPERCREME) 10 % cream [id number] Apply 1 Application topically as needed for muscle pain. Apply to feet LVenia Carbon MD Taking Active   venlafaxine XR (EFFEXOR-XR) 150 MG 24 hr capsule [id number] TAKE ONE CAPSULE BY MOUTH EVERY MORNING and TAKE ONE CAPSULE BY MOUTH EVERYDAY AT BEDTIME LVenia Carbon MD Taking Active             Patient Active Problem List   Diagnosis Date Noted   Meningioma ([facility] 07/10/2021   Edema 12/10/2020   Stage 3b chronic kidney disease ([place] 11/05/2020   Memory loss 09/09/2020   Multiple fibroadenomata of both  breasts 04/24/2019   MDD (major depressive disorder), recurrent episode, moderate ([place]) 08/09/2017   Osteopenia    Osteoarthritis of right knee 12/13/2016   Advance directive discussed with patient 12/13/2016   Type 2 diabetes,  controlled, with peripheral neuropathy ([place]) 02/15/2012   Routine general medical examination at a health care facility 05/03/2011   Hyperlipemia 09/01/2006    Immunization History  Administered Date(s) Administered   Fluad Quad(high Dose 65+) 01/07/2021, 01/26/2022   Influenza Split 01/14/2011   Influenza Whole 12/25/2008, 12/30/2009   Influenza-Unspecified 01/30/2014   Moderna Sars-Covid-2 Vaccination 07/20/2019, 08/17/2019, 02/26/2020   Pneumococcal Conjugate-13 12/13/2016   Pneumococcal Polysaccharide-23 12/25/2008   Td 12/01/2005, 11/26/2015    Conditions to be addressed/monitored:  Hyperlipidemia, Diabetes, and Chronic Kidney Disease  Care Plan : [place]  Updates made by Charlton Haws, [place] since 02/09/2022 12:00 AM     Problem: Hyperlipidemia and Diabetes   Priority: High     Long-Range Goal: Disease mgmt   Start Date: 11/17/2021  Expected End Date: 11/21/2022  Recent Progress: On track  Priority: High  Note:   Current Barriers:  Unable to maintain control of Diabetes  Pharmacist Clinical Goal(s):  Patient will achieve improvement in diabetes as evidenced by A1c < 8% through collaboration with PharmD and provider.   Interventions: 1:1 collaboration with Venia Carbon, MD regarding development and update of comprehensive plan of care as evidenced by provider attestation and co-signature Inter-disciplinary care team collaboration (see longitudinal plan of care) Comprehensive medication review performed; medication list updated in electronic medical record  Diabetes (A1c goal <8%) -Uncontrolled  - A1c 8.2% (01/2022) above goal, improved from 9% Reviewed AGP report: 01/27/22 to 02/09/22. Sensor active: 95%  Time in range (70-180): 40% (goal > 70%)  High (180-250): 38%  Very high (>250): 22%  Low (< 70): 0% (goal < 4%)  GMI: 8.0%; Average glucose: 198   Previous AGP report: 12/18/21 to 12/31/21. Sensor active: 95%  Time in range (70-180):  48% (goal > 70%)  High (>180): 52%  Low (< 70): 0% (goal < 4%)  GMI: 7.7%; Average glucose: 185  -Current medications: Jardiance 10 mg daily - Appropriate, Query Effective Lantus vial 30 units BID - Appropriate, Query Effective Freestyle Libre 3 (phone) - Appropriate, Effective, Safe, Accessible -Medications previously tried: glipizide (2012-2015), Victoza (n/v), metformin, Ozempic (n/v), Onglyza (hives) -Educated on Continuous glucose monitoring; - Libre 3 training completed 11/17/21 -Reviewed CGM data: glucose control is slightly better than recent A1c would suggest; fasting glucose at goal, post-prandial BG is high and best treated with diet changes,  -Reviewed medication options: ; pt has not tolerated GLP-1 RA; increasing Jardiance may not help due to GFR < 45; glipizide, meglitinides or bolus insulin are options for the future -Recommended to continue current medication, focus on diet changes to improve post-prandial BG  Hyperlipidemia: (LDL goal < 100) -Uncontrolled - LDL 163 (10/2021) without statin, atorvastatin restarted 10/2021 -Current treatment: Atorvastatin 20 mg twice a week - Appropriate, Query Effective -Medications previously tried: n/a  -Educated on Cholesterol goals; Benefits of statin for ASCVD risk reduction; -Recommended to continue current medication; repeat lipid panel at next OV  Chronic Kidney Disease Stage 3b  -All medications assessed for renal dosing and appropriateness in chronic kidney disease. -Recommended to continue current medication; pt has nephrology appt scheduled 04/23/22  Patient Goals/Self-Care Activities Patient will:  - take medications as prescribed as evidenced by patient report and record review focus on medication adherence by routine check  glucose via Libre 3, document, and provide at future appointments     Medication Assistance: None required.  Patient affirms current coverage meets needs.  Compliance/Adherence/Medication fill  history: Care Gaps: -Colonoscopy - discussed 02/09/22. Pt declined. -Mammogram (due 12/24/20) - discussed 02/09/22. Pt declined. She had sister who died of breast cancer and "does not want to know". Discussed benefits of mammogram for early detection, pt will "think and pray about it" and consider mammogram. -Eye exam (due 04/25/18) - pt sees eye doctor regularly [facility]  Star-Rating Drugs: Atorvastatin - [place] 100% Jardiance - [place] 100%  Medication Access: Within the past 30 days, how often has patient missed a dose of medication? 0 Is a pillbox or other method used to improve adherence? Yes  Factors that may affect medication adherence? no barriers identified Are meds synced by current pharmacy? Yes  Are meds delivered by current pharmacy? Yes  Does patient experience delays in picking up medications due to transportation concerns? No   [facility] Reviewed: Is patient disadvantaged to use [facility]?: No  Current Rx insurance plan: HTA Name and location of Current pharmacy:  [facility] - [place], [place] - [address] [place] [address] Phone: [phone] Fax: [phone]  [facility] services reviewed with patient today?: Yes  30-day Packs (delivery 02/10/22) Venlafaxine ER 1107m  take 1 tablet breakfast and 1 tablet bedtime Gabapentin 305mtake 2 tablets bedtime Vitamin D3 2583m take 1 tablet breakfast Vitamin B12 500m75make 1 tablet breakfast Jardiance 10mg51mke 1 tablet breakfast Atorvastatin 20mg 63m 2 times week (wednesday,Sunday breakfast)   Care Plan and Follow Up Patient Decision:  Patient agrees to Care Plan and Follow-up.  Plan: Telephone follow up appointment with care management team member scheduled for:  3 month  LindseCharlene BrookemD, BCACP Clinical Pharmacist [phone]

## 2022-02-09 NOTE — Telephone Encounter (Signed)
  Chronic Care Management   Outreach Note  02/09/2022 Name: Jacqueline Cole MRN: [id number] DOB: [birth year]  Referred by: Venia Carbon, MD  Patient had a phone appointment scheduled with clinical pharmacist today.  An unsuccessful telephone outreach was attempted today. The patient was referred to the pharmacist for assistance with medications, care management and care coordination.   Patient will NOT be penalized in any way for missing a CCM appointment. The no-show fee does not apply.  If possible, a message was left to return call to: [phone] or to [facility].  [name], PharmD, BCACP Clinical Pharmacist [facility] [place] [facility] at [facility] [phone]

## 2022-02-09 NOTE — Telephone Encounter (Signed)
Pt returned call. See CCM encounter.

## 2022-02-09 NOTE — Telephone Encounter (Signed)
-----   Message from Charlton Haws, [facility] sent at 02/09/2022 12:27 PM EST ----- Regarding: eye exam Pt told me she had eye exam this year - [facility]? I'm not sure which one - can you get their phone number from her and request DM eye exam records please.

## 2022-02-09 NOTE — Chronic Care Management (AMB) (Addendum)
[place] in [place] to request records for DM eye exam. They report patient had cataract surgery recently, but no records of DM eye exam.  [name], CPP notified  [name], [place]  [phone]

## 2022-02-09 NOTE — Patient Instructions (Addendum)
Visit Information  Phone number for Pharmacist: [phone]   Goals Addressed   None     Care Plan : LCSW Plan of Care  Updates made by Charlton Haws, [facility] since 02/09/2022 12:00 AM     Problem: Depression and anxiety   Priority: High  Note:   Current barriers:   Chronic Mental Health needs related to childhood, traumatic events, on-going depression and anxiety Mental Health Concerns  Needs Support, Education, and Care Coordination in order to meet unmet mental health needs. Clinical Goal(s): demonstrate a reduction in symptoms related to :Anxiety with Excessive Worry, and Depression: depressed mood anxiety loss of energy/fatigue feelings of worthlessness, guilt disturbed sleep  patient will follow up with [facility] for initial counseling visit* as directed by SW  Clinical Interventions: CSW spoke with pt who reports she is dealing with on-going anxiety and depression. "Much better since hospitalized". 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 Upon returning home her anxiety was worse but has gotten better. Pt shares a history of several deaths (mom at 66yo, brother in MVA, aunt who was hit by car while she was walking with her), and other- she also admits to self doubt/lack of self worth and would like to seek counseling support to help work through these past events.  CSW completed the Depression screening with pt- scoring "13" (moderate). She denies any SI/HI. She also shares she has been on [facility] for over 20 years- will ask PCP to assess possible adjustments to her RX treatment plan.  Pt has a supportive husband and extended family-    Depression screen [facility] 2/9 02/26/2021 11/13/2020  Decreased Interest 3 0  Down, Depressed, Hopeless 3 0  PHQ - 2 Score 6 0  Altered sleeping 2 0  Tired, decreased energy 3 0  Change in appetite 0 0  Feeling bad or failure about yourself  1 0  Trouble  concentrating 0 0  Moving slowly or fidgety/restless 1 0  Suicidal thoughts 0 0  PHQ-9 Score 13 0  Difficult doing work/chores Somewhat difficult -    Assessed patient's previous and current treatment, coping skills, support system and barriers to care  Motivational Interviewing employed Depression screen reviewed  PHQ2/ PHQ9 completed Solution-Focused Strategies  Active listening / Reflection utilized  Emotional Support Provided Provided psychoeducation for mental health needs  ; Review resources, discussed options and provided patient information about  Options for mental health treatment based on need and insurance 1:1 collaboration with primary care provider regarding development and update of comprehensive plan of care as evidenced by provider attestation and co-signature Inter-disciplinary care team collaboration (see longitudinal plan of care) Patient Goals/Self-Care Activities: Over the next 30 days Attend counseling appointment. 03/18/21 11am  Continue with compliance of taking medication  Continue with your self-care action plan     Care Plan : [place]  Updates made by Charlton Haws, [place] since 02/09/2022 12:00 AM     Problem: Hyperlipidemia and Diabetes   Priority: High     Long-Range Goal: Disease mgmt   Start Date: 11/17/2021  Expected End Date: 11/21/2022  Recent Progress: On track  Priority: High  Note:   Current Barriers:  Unable to maintain control of Diabetes  Pharmacist Clinical Goal(s):  Patient will achieve improvement in diabetes as evidenced by A1c < 8% through collaboration with PharmD and provider.   Interventions: 1:1 collaboration with Venia Carbon, MD regarding development  and update of comprehensive plan of care as evidenced by provider attestation and co-signature Inter-disciplinary care team collaboration (see longitudinal plan of care) Comprehensive medication review performed; medication list updated in electronic  medical record  Diabetes (A1c goal <8%) -Uncontrolled  - A1c 8.2% (01/2022) above goal, improved from 9% Reviewed AGP report: 01/27/22 to 02/09/22. Sensor active: 95%  Time in range (70-180): 40% (goal > 70%)  High (180-250): 38%  Very high (>250): 22%  Low (< 70): 0% (goal < 4%)  GMI: 8.0%; Average glucose: 198   Previous AGP report: 12/18/21 to 12/31/21. Sensor active: 95%  Time in range (70-180): 48% (goal > 70%)  High (>180): 52%  Low (< 70): 0% (goal < 4%)  GMI: 7.7%; Average glucose: 185  -Current medications: Jardiance 10 mg daily - Appropriate, Query Effective Lantus vial 30 units BID - Appropriate, Query Effective Freestyle Libre 3 (phone) - Appropriate, Effective, Safe, Accessible -Medications previously tried: glipizide (2012-2015), Victoza (n/v), metformin, Ozempic (n/v), Onglyza (hives) -Educated on Continuous glucose monitoring; - Libre 3 training completed 11/17/21 -Reviewed CGM data: glucose control is slightly better than recent A1c would suggest; fasting glucose at goal, post-prandial BG is high and best treated with diet changes,  -Reviewed medication options: ; pt has not tolerated GLP-1 RA; increasing Jardiance may not help due to GFR < 45; glipizide, meglitinides or bolus insulin are options for the future -Recommended to continue current medication, focus on diet changes to improve post-prandial BG  Hyperlipidemia: (LDL goal < 100) -Uncontrolled - LDL 163 (10/2021) without statin, atorvastatin restarted 10/2021 -Current treatment: Atorvastatin 20 mg twice a week - Appropriate, Query Effective -Medications previously tried: n/a  -Educated on Cholesterol goals; Benefits of statin for ASCVD risk reduction; -Recommended to continue current medication; repeat lipid panel at next OV  Chronic Kidney Disease Stage 3b  -All medications assessed for renal dosing and appropriateness in chronic kidney disease. -Recommended to continue current medication; pt has nephrology  appt scheduled 04/23/22  Patient Goals/Self-Care Activities Patient will:  - take medications as prescribed as evidenced by patient report and record review focus on medication adherence by routine check glucose via [place] 3, document, and provide at future appointments      Patient verbalizes understanding of instructions and care plan provided today and agrees to view in [place]. Active MyChart status and patient understanding of how to access instructions and care plan via MyChart confirmed with patient.    Telephone follow up appointment with pharmacy team member scheduled for: 3 months  [name], PharmD, [facility] Clinical Pharmacist [facility] at [facility] [phone]

## 2022-02-26 ENCOUNTER — Telehealth: Payer: Self-pay

## 2022-02-26 NOTE — Chronic Care Management (AMB) (Unsigned)
  Chronic Care Management Pharmacy Assistant   Name: Jacqueline Cole  MRN: [id number] DOB: 01/10/1951   Reason for Encounter: Medication Adherence and Delivery Coordination    Recent office visits:  None since last CCM contact  Recent consult visits:  None since last CCM contact  Hospital visits:  None in previous 6 months  Medications: Outpatient Encounter Medications as of 02/26/2022  Medication Sig   atorvastatin (LIPITOR) 20 MG tablet Take 1 tablet (20 mg total) by mouth 2 (two) times a week.   Blood Glucose Monitoring Suppl (ONETOUCH VERIO FLEX SYSTEM) w/Device KIT Use to check blood sugar 2 times a day.   Continuous Blood Gluc Sensor (FREESTYLE LIBRE 3 SENSOR) MISC Place 1 sensor on the skin every 14 days. Use to check glucose continuously   cyanocobalamin (VITAMIN B12) 500 MCG tablet Take 1 tablet (500 mcg total) by mouth daily.   empagliflozin (JARDIANCE) 10 MG TABS tablet Take 1 tablet (10 mg total) by mouth daily before breakfast.   gabapentin (NEURONTIN) 300 MG capsule TAKE TWO CAPSULES BY MOUTH EVERYDAY AT BEDTIME   glucose blood (ONETOUCH VERIO) test strip Use as instructed to check blood sugar 2 times a day.   hydrocortisone cream 1 % Apply 1 Application topically daily as needed for itching.   insulin glargine (LANTUS) 100 UNIT/ML injection 35 units in AM, 30 in PM   Lancets (ONETOUCH ULTRASOFT) lancets Use as instructed to check blood sugar 2 times a day.   omeprazole (PRILOSEC) 20 MG capsule Take 1 capsule (20 mg total) by mouth daily.   Propylene Glycol (SYSTANE COMPLETE OP) Place 3 drops into both eyes 3 (three) times daily.   trolamine salicylate (ASPERCREME) 10 % cream Apply 1 Application topically as needed for muscle pain. Apply to feet   venlafaxine XR (EFFEXOR-XR) 150 MG 24 hr capsule TAKE ONE CAPSULE BY MOUTH EVERY MORNING and TAKE ONE CAPSULE BY MOUTH EVERYDAY AT BEDTIME   No facility-administered encounter medications on file as of 02/26/2022.   BP  Readings from Last 3 Encounters:  01/26/22 110/70  10/21/21 124/74  07/10/21 136/76    Pulse Readings from Last 3 Encounters:  01/26/22 90  10/21/21 80  07/10/21 75    Lab Results  Component Value Date/Time   HGBA1C 8.2 (A) 01/26/2022 10:59 AM   HGBA1C 9.0 (H) 10/21/2021 10:17 AM   HGBA1C 8.4 (A) 07/10/2021 07:30 AM   HGBA1C 5.5 11/16/2020 11:48 AM   Lab Results  Component Value Date   CREATININE 1.42 (H) 01/26/2022   BUN 11 01/26/2022   GFR 37.43 (L) 01/26/2022   GFRNONAA >60 11/18/2020   GFRAA 66 10/23/2007   NA 139 01/26/2022   K 4.0 01/26/2022   CALCIUM 9.3 01/26/2022   CO2 31 01/26/2022     Last adherence delivery date:02/10/22      Patient is due for next adherence delivery on: 03/11/22  Multiple attempts made to reach patient. Unsuccessful outreach. Will refill based off of last adherence fill.   This delivery to include: Adherence Packaging  30 Days  Venlafaxine ER 150mg  take 1 tablet breakfast and 1 tablet bedtime Gabapentin 300mg take 2 tablets bedtime Vitamin D3 25mcg  take 1 tablet breakfast Vitamin B12 500mcg take 1 tablet breakfast Jardiance 10mg  take 1 tablet breakfast Atorvastatin 20mg take 2 times week (wednesday,Sunday breakfast)   No refill request needed.   Annual wellness visit in last year? Yes Most Recent BP reading:124/74  If Diabetic: Most recent A1C reading:8.2 Last   eye exam / retinopathy screening:2019 Last diabetic foot exam:UTD  Cycle dispensing form sent to [facility], CPP for review. Next CCM appointment: 05/11/22  [name], CPP notified  [name], [place]  [phone]

## 2022-03-31 ENCOUNTER — Telehealth: Payer: Self-pay

## 2022-03-31 NOTE — Progress Notes (Signed)
Care Management & Coordination Services Pharmacy Team  Reason for Encounter: Medication coordination and delivery  Contacted patient on 03/31/2022 to discuss medications   Recent office visits:  None since last CCM contact  Recent consult visits:  None since last CCM contact  Hospital visits:  None in previous 6 months  Medications: Outpatient Encounter Medications as of 03/31/2022  Medication Sig   atorvastatin (LIPITOR) 20 MG tablet Take 1 tablet (20 mg total) by mouth 2 (two) times a week.   Blood Glucose Monitoring Suppl ([place]) w/Device KIT Use to check blood sugar 2 times a day.   Continuous Blood Gluc Sensor (FREESTYLE LIBRE 3 SENSOR) MISC Place 1 sensor on the skin every 14 days. Use to check glucose continuously   cyanocobalamin (VITAMIN B12) 500 MCG tablet Take 1 tablet (500 mcg total) by mouth daily.   empagliflozin (JARDIANCE) 10 MG TABS tablet Take 1 tablet (10 mg total) by mouth daily before breakfast.   gabapentin (NEURONTIN) 300 MG capsule TAKE TWO CAPSULES BY MOUTH EVERYDAY AT BEDTIME   glucose blood (ONETOUCH VERIO) test strip Use as instructed to check blood sugar 2 times a day.   hydrocortisone cream 1 % Apply 1 Application topically daily as needed for itching.   insulin glargine (LANTUS) 100 UNIT/ML injection 35 units in AM, 30 in PM   Lancets (ONETOUCH ULTRASOFT) lancets Use as instructed to check blood sugar 2 times a day.   omeprazole (PRILOSEC) 20 MG capsule Take 1 capsule (20 mg total) by mouth daily.   Propylene Glycol (SYSTANE COMPLETE OP) Place 3 drops into both eyes 3 (three) times daily.   trolamine salicylate (ASPERCREME) 10 % cream Apply 1 Application topically as needed for muscle pain. Apply to feet   venlafaxine XR (EFFEXOR-XR) 150 MG 24 hr capsule TAKE ONE CAPSULE BY MOUTH EVERY MORNING and TAKE ONE CAPSULE BY MOUTH EVERYDAY AT BEDTIME   No facility-administered encounter medications on file as of 03/31/2022.   BP Readings  from Last 3 Encounters:  01/26/22 110/70  10/21/21 124/74  07/10/21 136/76    Pulse Readings from Last 3 Encounters:  01/26/22 90  10/21/21 80  07/10/21 75    Lab Results  Component Value Date/Time   HGBA1C 8.2 (A) 01/26/2022 10:59 AM   HGBA1C 9.0 (H) 10/21/2021 10:17 AM   HGBA1C 8.4 (A) 07/10/2021 07:30 AM   HGBA1C 5.5 11/16/2020 11:48 AM   Lab Results  Component Value Date   CREATININE 1.42 (H) 01/26/2022   BUN 11 01/26/2022   GFR 37.43 (L) 01/26/2022   GFRNONAA >60 11/18/2020   GFRAA 66 10/23/2007   NA 139 01/26/2022   K 4.0 01/26/2022   CALCIUM 9.3 01/26/2022   CO2 31 01/26/2022     Last adherence delivery date:03/11/22      Patient is due for next adherence delivery on: 04/12/2022  Spoke with patient on 04/01/2022 reviewed medications and coordinated delivery.  This delivery to include: Adherence Packaging  30 Days  Venlafaxine ER '150mg'$   take 1 tablet breakfast and 1 tablet bedtime Gabapentin '300mg'$  take 2 tablets bedtime Vitamin D3 65mg  take 1 tablet breakfast Vitamin B12 5043m take 1 tablet breakfast Jardiance '10mg'$   take 1 tablet breakfast Atorvastatin '20mg'$  take 2 times week (wednesday,Sunday breakfast)  Vial: Lantus-inject 35 units am, 30 pm- delivery 04/01/22  No refill request needed.  Confirmed delivery date of 04/12/2022, advised patient that pharmacy will contact them the morning of delivery.   Any concerns about your medications? Yes  patient reports did not get delivery of insulin on the 12th of the month. Pharmacy contacted and delivery to go out today.   How often do you forget or accidentally miss a dose? Rarely  Do you use a pillbox? No  Is patient in packaging Yes- satisfied with service from [facility]     Recent blood pressure readings are as follows:none available   Recent blood glucose readings are as follows:none available    Cycle dispensing form sent to [facility],   for review.   [name], CMA

## 2022-04-23 ENCOUNTER — Ambulatory Visit: Payer: PPO | Admitting: Internal Medicine

## 2022-05-03 DIAGNOSIS — N1832 Chronic kidney disease, stage 3b: Secondary | ICD-10-CM | POA: Diagnosis not present

## 2022-05-03 DIAGNOSIS — M6281 Muscle weakness (generalized): Secondary | ICD-10-CM | POA: Diagnosis not present

## 2022-05-03 DIAGNOSIS — E1122 Type 2 diabetes mellitus with diabetic chronic kidney disease: Secondary | ICD-10-CM | POA: Diagnosis not present

## 2022-05-03 DIAGNOSIS — N189 Chronic kidney disease, unspecified: Secondary | ICD-10-CM | POA: Diagnosis not present

## 2022-05-03 DIAGNOSIS — I10 Essential (primary) hypertension: Secondary | ICD-10-CM | POA: Diagnosis not present

## 2022-05-03 DIAGNOSIS — R809 Proteinuria, unspecified: Secondary | ICD-10-CM | POA: Diagnosis not present

## 2022-05-06 ENCOUNTER — Telehealth: Payer: Self-pay

## 2022-05-06 NOTE — Progress Notes (Signed)
Care Management & Coordination Services Pharmacy Team  Reason for Encounter: Appointment Reminder  Contacted patient to confirm telephone appointment with [name] , PharmD on 05/11/22 at 11:00. Spoke with family and caregiver on 05/07/2022  Husband Lanny Hurst  Do you have any problems getting your medications? No  What is your top health concern you would like to discuss at your upcoming visit?  No concerns at this time   Have you seen any other providers since your last visit with PCP? Yes- Nephrology  Hospital visits:  None in previous 6 months   Star Rating Drugs:  Medication:  Last Fill: Day Supply   [facility]  Jardiance '10mg'$  04/07/22 30 Lantus   04/27/22 29 Atorvastatin '20mg'$  04/07/22 30  Care Gaps: Annual wellness visit in last year? Yes  If Diabetic: Last eye exam / retinopathy screening:2019 Last diabetic foot exam:UTD   [name], PharmD notified  [name], [place] Assistant [phone]

## 2022-05-11 ENCOUNTER — Ambulatory Visit: Payer: PPO | Admitting: Pharmacist

## 2022-05-11 NOTE — Patient Instructions (Signed)
Visit Information  Phone number for Pharmacist: [phone]  Thank you for meeting with me to discuss your medications! Below is a summary of what we talked about during the visit:     Recommendations: -Advised to switch Lantus dosing to 35 u AM and 30u PM so larger dose is in AM; advised diet changes to improve post-prandial BG spikes -Rescheduled missed PCP appt  Follow up plan: -Pharmacist follow up televisit scheduled for 6 weeks -PCP appt 05/26/22   [name], PharmD, BCACP Clinical Pharmacist [facility] [phone]

## 2022-05-11 NOTE — Progress Notes (Signed)
Care Management & Coordination Services Pharmacy Note  05/11/2022 Name:  Jacqueline Cole MRN:  [id number] DOB:  July 10, 1950  Summary: -DM: A1c 8.2% (01/2022) improved from 9%; pt is wearing Freestyle Libre 3. She reports she has been sick (cough, headaches) for about a month which may be causing glucose elevations -Reviewed AGP report: 04/01/22 to 04/14/22. Sensor active: 100%  Time in range (70-180): 34% (goal > 70%)  High (180-250): 31%  Very high (>250): 35%  Low (< 70): 0% (goal < 4%)  GMI: 8.5%; Average glucose: 216 -Post-prandial BG is elevated to 300s and she is having low alarms 3-6am; she is taking Lantus 30 units AM, 35 units PM; would avoid increasing basal insulin or adding sulfonylurea given low fasting BG, avoid GLP-1 RA due to previous intolerance   Recommendations: -Advised to switch Lantus dosing to 35 u AM and 30u PM so larger dose is in AM; advised diet changes to improve post-prandial BG spikes -Rescheduled missed PCP appt  Follow up plan: -Pharmacist follow up televisit scheduled for 6 weeks -PCP appt 05/26/22    Subjective: Jacqueline Cole is an 71 y.o. year old female who is a primary patient of Venia Carbon, MD.  The care coordination team was consulted for assistance with disease management and care coordination needs.    Engaged with patient by telephone for follow up visit.  Recent office visits: 01/26/22 Dr Silvio Pate OV: f/u - A1c 8.2%; GFR 42 - referred to nephrology.   10/21/21 Dr Silvio Pate OV: annual - A1c 9.0. LDL 163. Restarted atorvastatin 20 mg 2x weekly. RTC 3 months.  Recent consult visits: 05/03/22 Dr Candiss Norse (Nephrology): new pt - drink 30-40 oz water daily. RTC 4 weeks.  Hospital visits: None in previous 6 months   Objective:  Lab Results  Component Value Date   CREATININE 1.42 (H) 01/26/2022   BUN 11 01/26/2022   GFR 37.43 (L) 01/26/2022   GFRNONAA >60 11/18/2020   GFRAA 66 10/23/2007   NA 139 01/26/2022   K 4.0 01/26/2022   CALCIUM  9.3 01/26/2022   CO2 31 01/26/2022   GLUCOSE 160 (H) 01/26/2022    Lab Results  Component Value Date/Time   HGBA1C 8.2 (A) 01/26/2022 10:59 AM   HGBA1C 9.0 (H) 10/21/2021 10:17 AM   HGBA1C 8.4 (A) 07/10/2021 07:30 AM   HGBA1C 5.5 11/16/2020 11:48 AM   GFR 37.43 (L) 01/26/2022 09:06 AM   GFR 42.88 (L) 10/21/2021 10:17 AM   MICROALBUR 1.2 10/21/2021 10:17 AM   MICROALBUR 4.0 (H) 11/26/2015 09:58 AM    Last diabetic Eye exam:  Lab Results  Component Value Date/Time   HMDIABEYEEXA Retinopathy (A) 04/25/2017 12:00 AM    Last diabetic Foot exam:  Lab Results  Component Value Date/Time   HMDIABFOOTEX done 10/21/2021 12:00 AM     Lab Results  Component Value Date   CHOL 251 (H) 10/21/2021   HDL 38.60 (L) 10/21/2021   LDLCALC 46 06/07/2019   LDLDIRECT 163.0 10/21/2021   TRIG 298.0 (H) 10/21/2021   CHOLHDL 7 10/21/2021       Latest Ref Rng & Units 01/26/2022    9:06 AM 10/21/2021   10:17 AM 11/18/2020    5:02 AM  Hepatic Function  Total Protein 6.0 - 8.3 g/dL  6.9  4.6   Albumin 3.5 - 5.2 g/dL 4.2  4.0    4.0  2.8   AST 0 - 37 U/L  14  23   ALT 0 - 35 U/L  8  13   Alk Phosphatase 39 - 117 U/L  132  51   Total Bilirubin 0.2 - 1.2 mg/dL  0.8  1.4   Bilirubin, Direct 0.0 - 0.3 mg/dL  0.1      Lab Results  Component Value Date/Time   TSH 3.730 11/16/2020 10:21 AM   TSH 3.85 02/01/2018 08:51 AM   TSH 2.54 09/08/2012 09:36 AM   FREET4 1.55 (H) 11/16/2020 10:21 AM   FREET4 1.06 09/09/2020 10:06 AM       Latest Ref Rng & Units 10/21/2021   10:17 AM 11/18/2020    5:02 AM 11/17/2020    6:04 AM  CBC  WBC 4.0 - 10.5 K/uL 9.9  6.1  9.0   Hemoglobin 12.0 - 15.0 g/dL 14.1  10.4  10.0   Hematocrit 36.0 - 46.0 % 43.5  29.2  28.3   Platelets 150.0 - 400.0 K/uL 270.0  174  173     Lab Results  Component Value Date/Time   VITAMINB12 316 09/09/2020 10:06 AM    Clinical ASCVD: No  The 10-year ASCVD risk score (Arnett DK, et al., 2019) is: 15.7%   Values used to calculate the  score:     Age: 45 years     Sex: Female     Is Non-Hispanic African American: No     Diabetic: Yes     Tobacco smoker: No     Systolic Blood Pressure: [id number] mmHg     Is BP treated: No     HDL Cholesterol: 38.6 mg/dL     Total Cholesterol: 251 mg/dL        10/21/2021    9:19 AM 02/26/2021    9:14 AM 11/13/2020    2:27 PM  Depression screen PHQ 2/9  Decreased Interest 3 3 0  Down, Depressed, Hopeless 1 3 0  PHQ - 2 Score 4 6 0  Altered sleeping  2 0  Tired, decreased energy  3 0  Change in appetite  0 0  Feeling bad or failure about yourself   1 0  Trouble concentrating  0 0  Moving slowly or fidgety/restless  1 0  Suicidal thoughts  0 0  PHQ-9 Score  13 0  Difficult doing work/chores  Somewhat difficult      Social History   Tobacco Use  Smoking Status Former   Types: Cigarettes   Quit date: 03/15/1993   Years since quitting: 29.1  Smokeless Tobacco Never   BP Readings from Last 3 Encounters:  01/26/22 110/70  10/21/21 124/74  07/10/21 136/76   Pulse Readings from Last 3 Encounters:  01/26/22 90  10/21/21 80  07/10/21 75   Wt Readings from Last 3 Encounters:  01/26/22 211 lb (95.7 kg)  10/21/21 209 lb (94.8 kg)  07/10/21 218 lb (98.9 kg)   BMI Readings from Last 3 Encounters:  01/26/22 35.11 kg/m  10/21/21 34.78 kg/m  07/10/21 36.28 kg/m    Allergies  Allergen Reactions   Onglyza [Saxagliptin] Hives and Nausea And Vomiting   Ozempic (0.25 Or 0.5 Mg-Dose) [Semaglutide(0.25 Or 0.'5mg'$ -Dos)] Nausea And Vomiting   Liraglutide Nausea Only    Can't eat, nausea, higher sugars    Medications Reviewed Today     Reviewed by Charlton Haws, [facility] (Pharmacist) on 02/09/22 at 29  Med List Status: <None>   Medication Order Taking? Sig Documenting Provider Last Dose Status Informant  atorvastatin (LIPITOR) 20 MG tablet [id number] Yes Take 1 tablet (20 mg total) by mouth 2 (  two) times a week. Venia Carbon, MD Taking Active   Blood Glucose Monitoring  Suppl [facility] VERIO FLEX SYSTEM) w/Device KIT [id number] Yes Use to check blood sugar 2 times a day. Philemon Kingdom, MD Taking Active   Continuous Blood Gluc Sensor (FREESTYLE LIBRE 3 SENSOR) [place] [id number] Yes Place 1 sensor on the skin every 14 days. Use to check glucose continuously Venia Carbon, MD Taking Active   cyanocobalamin (VITAMIN B12) 500 MCG tablet [id number] Yes Take 1 tablet (500 mcg total) by mouth daily. Venia Carbon, MD Taking Active   empagliflozin (JARDIANCE) 10 MG TABS tablet [id number] Yes Take 1 tablet (10 mg total) by mouth daily before breakfast. Venia Carbon, MD Taking Active   gabapentin (NEURONTIN) 300 MG capsule [id number] Yes TAKE TWO CAPSULES BY MOUTH EVERYDAY AT BEDTIME Venia Carbon, MD Taking Active   glucose blood [facility] VERIO) test strip [id number] Yes Use as instructed to check blood sugar 2 times a day. Philemon Kingdom, MD Taking Active   hydrocortisone cream 1 % [id number] Yes Apply 1 Application topically daily as needed for itching. Venia Carbon, MD Taking Active   insulin glargine (LANTUS) 100 UNIT/ML injection [id number] Yes 35 units in AM, 30 in PM [name] I, MD Taking Active   Lancets [facility] ULTRASOFT) lancets [id number] Yes Use as instructed to check blood sugar 2 times a day. Philemon Kingdom, MD Taking Active   omeprazole (PRILOSEC) 20 MG capsule [id number] Yes Take 1 capsule (20 mg total) by mouth daily. Venia Carbon, MD Taking Active   Propylene Glycol [facility] COMPLETE OP) [id number] Yes Place 3 drops into both eyes 3 (three) times daily. [provider] Taking Active   trolamine salicylate (ASPERCREME) 10 % cream [id number] Yes Apply 1 Application topically as needed for muscle pain. Apply to feet Venia Carbon, MD Taking Active   venlafaxine XR (EFFEXOR-XR) 150 MG 24 hr capsule [id number] Yes TAKE ONE CAPSULE BY MOUTH EVERY MORNING and TAKE ONE CAPSULE BY MOUTH EVERYDAY AT BEDTIME Venia Carbon,  MD Taking Active             SDOH:  (Social Determinants of Health) assessments and interventions performed: No SDOH Interventions    Flowsheet Row Chronic Care Management from 02/26/2021 in [place] at [place] Management from 03/20/2020 in [place] at [place]  SDOH Interventions    Transportation Interventions Intervention Not Indicated --  Depression Interventions/Treatment  Medication, Counseling --  Financial Strain Interventions -- Intervention Not Indicated  [Medications affordable]       Medication Assistance: None required.  Patient affirms current coverage meets needs.  Medication Access: Within the past 30 days, how often has patient missed a dose of medication? 0 Is a pillbox or other method used to improve adherence? Yes  Factors that may affect medication adherence? no barriers identified Are meds synced by current pharmacy? Yes  Are meds delivered by current pharmacy? Yes  Does patient experience delays in picking up medications due to transportation concerns? No   [facility] Reviewed: Is patient disadvantaged to use [facility]?: No  Current Rx insurance plan: HTA Name and location of Current pharmacy:  [facility] - [place], [place] - [address] [place] [address] Phone: [phone] Fax: [phone]  [facility] services reviewed with patient today?: Yes  30-day packs: delivery 04/12/22 Venlafaxine ER '150mg'$   take 1 tablet breakfast and 1  tablet bedtime Gabapentin '300mg'$  take 2 tablets bedtime Vitamin D3 56mg  take 1 tablet breakfast Vitamin B12 5067m take 1 tablet breakfast Jardiance '10mg'$   take 1 tablet breakfast Atorvastatin '20mg'$  take 2 times week (wednesday,Sunday breakfast)  Compliance/Adherence/Medication fill history: Care Gaps: Colonoscopy (never done) Eye exam (due 04/2018) Mammogram (due  12/2020) --Discussed screenings - mammogram, colonoscopy declined.  Star-Rating Drugs: Jardiance - PDC 100% Atorvastatin - PDC 100%   ASSESSMENT / PLAN  Diabetes (A1c goal <8%) -Uncontrolled  - A1c 8.2% (01/2022) above goal, improved from 9%; pt reports she has has a cold for the last ~4 weeks which may explain somewhat worse glucose control; she was out of CGM sensors for a while and just re-started CGM last week -Reviewed AGP report: 04/01/22 to 04/14/22. Sensor active: 100%  Time in range (70-180): 34% (goal > 70%)  High (180-250): 31%  Very high (>250): 35%  Low (< 70): 0% (goal < 4%)  GMI: 8.5%; Average glucose: 216  Last 5 days:  -Current medications: Jardiance 10 mg daily - Appropriate, Query Effective Lantus vial 30/35 units BID - Appropriate, Query Effective Freestyle Libre 3 (phone) - Appropriate, Effective, Safe, Accessible -Medications previously tried: glipizide (2012-2015), Victoza (n/v), metformin, Ozempic (n/v), Onglyza (hives) -Educated on Continuous glucose monitoring; - Libre 3 training completed 11/17/21 -Reviewed CGM data: pt is having high post-prandial BG up to 300s and low alarms overnight (3-6am) -Reviewed medication options: ; pt has not tolerated GLP-1 RA; increasing Jardiance may not help due to GFR < 45; Actos, glipizide, meglitinides or bolus insulin are options for the future -Advised to switch Lantus dosing to 35u AM and 30u PM; focus on diet changes to improve post-prandial BG  Hyperlipidemia: (LDL goal < 100) -Uncontrolled - LDL 163 (10/2021) without statin, atorvastatin restarted 10/2021 -Current treatment: Atorvastatin 20 mg twice a week - Appropriate, Query Effective -Medications previously tried: n/a  -Educated on Cholesterol goals; Benefits of statin for ASCVD risk reduction; -Recommended to continue current medication; repeat lipid panel at next OV  Chronic Kidney Disease Stage 3b  -All medications assessed for renal dosing and appropriateness  in chronic kidney disease. -Recommended to continue current medication  Health Maintenance -Vaccine gaps: Shingrix, Prevnar, Covid booster -Pt reports pain in legs, legs give out with limited activity; nephrology has suggested PT; advised to discuss with PCP at upcoming appt    LiCharlene BrookePharmD, BCACP Clinical Pharmacist [phone]

## 2022-05-22 ENCOUNTER — Other Ambulatory Visit: Payer: Self-pay | Admitting: Internal Medicine

## 2022-05-22 DIAGNOSIS — E1142 Type 2 diabetes mellitus with diabetic polyneuropathy: Secondary | ICD-10-CM

## 2022-05-26 ENCOUNTER — Encounter: Payer: Self-pay | Admitting: Internal Medicine

## 2022-05-26 ENCOUNTER — Ambulatory Visit: Payer: PPO | Admitting: Internal Medicine

## 2022-05-26 DIAGNOSIS — I739 Peripheral vascular disease, unspecified: Secondary | ICD-10-CM | POA: Insufficient documentation

## 2022-05-27 DIAGNOSIS — R809 Proteinuria, unspecified: Secondary | ICD-10-CM | POA: Diagnosis not present

## 2022-05-27 DIAGNOSIS — I10 Essential (primary) hypertension: Secondary | ICD-10-CM | POA: Diagnosis not present

## 2022-05-27 DIAGNOSIS — E1129 Type 2 diabetes mellitus with other diabetic kidney complication: Secondary | ICD-10-CM | POA: Diagnosis not present

## 2022-05-27 DIAGNOSIS — N1832 Chronic kidney disease, stage 3b: Secondary | ICD-10-CM | POA: Diagnosis not present

## 2022-05-27 DIAGNOSIS — M6281 Muscle weakness (generalized): Secondary | ICD-10-CM | POA: Diagnosis not present

## 2022-05-27 DIAGNOSIS — N189 Chronic kidney disease, unspecified: Secondary | ICD-10-CM | POA: Diagnosis not present

## 2022-05-27 DIAGNOSIS — E1122 Type 2 diabetes mellitus with diabetic chronic kidney disease: Secondary | ICD-10-CM | POA: Diagnosis not present

## 2022-05-28 ENCOUNTER — Telehealth: Payer: Self-pay

## 2022-05-28 NOTE — Progress Notes (Unsigned)
Care Management & Coordination Services Pharmacy Team  Reason for Encounter: Medication Coordination and Delivery  Contacted patient to discuss medications and coordinate delivery from [facility].  {US HC Outreach:28874}  Cycle dispensing form sent to {Blank multiple:19196::"Lindsay Foltanski, PharmD","Lindsey Saintsing, CTL"} for review.   Last adherence delivery date: 04/12/2022      Patient is due for next adherence delivery on: 06/09/2022  This delivery to include: Adherence Packaging  30 Days  Venlafaxine ER 150mg   take 1 tablet breakfast and 1 tablet bedtime Gabapentin 300mg  take 2 tablets bedtime Vitamin D3 58mcg  take 1 tablet breakfast Vitamin B12 545mcg take 1 tablet breakfast Jardiance 10mg   take 1 tablet breakfast Atorvastatin 20mg  take 2 times week (wednesday,Sunday breakfast)   Vial:  Lantus-inject 35 units am, 30 pm  Patient declined the following medications this month: ***  No refill request needed.  {Delivery UJ:6107908  Any concerns about your medications? {yes/no:20286}  How often do you forget or accidentally miss a dose? {Missed doses:25554}  Do you use a pillbox? No  Is patient in packaging {yes/no:20286}  If yes  What is the date on your next pill pack?  Any concerns or issues with your packaging?  Recent blood pressure readings are as follows:***  Recent blood glucose readings are as follows:***  Chart review: Recent office visits:  None since last contact  Recent consult visits:  None since last contact  Hospital visits:  None in previous 6 months  Medications: Outpatient Encounter Medications as of 05/28/2022  Medication Sig   atorvastatin (LIPITOR) 20 MG tablet Take 1 tablet (20 mg total) by mouth 2 (two) times a week.   Blood Glucose Monitoring Suppl ([place]) w/Device KIT Use to check blood sugar 2 times a day.   Continuous Blood Gluc Sensor (FREESTYLE LIBRE 3 SENSOR) MISC PLACE 1 SENSOR ON THE SKIN  EVERY 14 DAYS   cyanocobalamin (VITAMIN B12) 500 MCG tablet Take 1 tablet (500 mcg total) by mouth daily.   empagliflozin (JARDIANCE) 10 MG TABS tablet Take 1 tablet (10 mg total) by mouth daily before breakfast.   gabapentin (NEURONTIN) 300 MG capsule TAKE TWO CAPSULES BY MOUTH EVERYDAY AT BEDTIME   glucose blood (ONETOUCH VERIO) test strip Use as instructed to check blood sugar 2 times a day.   hydrocortisone cream 1 % Apply 1 Application topically daily as needed for itching.   insulin glargine (LANTUS) 100 UNIT/ML injection 35 units in AM, 30 in PM   Lancets (ONETOUCH ULTRASOFT) lancets Use as instructed to check blood sugar 2 times a day.   Propylene Glycol (SYSTANE COMPLETE OP) Place 3 drops into both eyes 3 (three) times daily.   trolamine salicylate (ASPERCREME) 10 % cream Apply 1 Application topically as needed for muscle pain. Apply to feet   venlafaxine XR (EFFEXOR-XR) 150 MG 24 hr capsule TAKE ONE CAPSULE BY MOUTH EVERY MORNING and TAKE ONE CAPSULE BY MOUTH EVERYDAY AT BEDTIME   No facility-administered encounter medications on file as of 05/28/2022.   BP Readings from Last 3 Encounters:  01/26/22 110/70  10/21/21 124/74  07/10/21 136/76    Pulse Readings from Last 3 Encounters:  01/26/22 90  10/21/21 80  07/10/21 75    Lab Results  Component Value Date/Time   HGBA1C 8.2 (A) 01/26/2022 10:59 AM   HGBA1C 9.0 (H) 10/21/2021 10:17 AM   HGBA1C 8.4 (A) 07/10/2021 07:30 AM   HGBA1C 5.5 11/16/2020 11:48 AM   Lab Results  Component Value Date  CREATININE 1.42 (H) 01/26/2022   BUN 11 01/26/2022   GFR 37.43 (L) 01/26/2022   GFRNONAA >60 11/18/2020   GFRAA 66 10/23/2007   NA 139 01/26/2022   K 4.0 01/26/2022   CALCIUM 9.3 01/26/2022   CO2 31 01/26/2022   [name], CPP notified   Marijean Niemann, [place] Clinical Pharmacy Assistant [phone]

## 2022-05-31 ENCOUNTER — Telehealth: Payer: Self-pay

## 2022-05-31 DIAGNOSIS — R809 Proteinuria, unspecified: Secondary | ICD-10-CM | POA: Diagnosis not present

## 2022-05-31 DIAGNOSIS — N1832 Chronic kidney disease, stage 3b: Secondary | ICD-10-CM | POA: Diagnosis not present

## 2022-05-31 DIAGNOSIS — E1129 Type 2 diabetes mellitus with other diabetic kidney complication: Secondary | ICD-10-CM | POA: Diagnosis not present

## 2022-05-31 DIAGNOSIS — N189 Chronic kidney disease, unspecified: Secondary | ICD-10-CM | POA: Diagnosis not present

## 2022-05-31 DIAGNOSIS — I10 Essential (primary) hypertension: Secondary | ICD-10-CM | POA: Diagnosis not present

## 2022-05-31 DIAGNOSIS — E1122 Type 2 diabetes mellitus with diabetic chronic kidney disease: Secondary | ICD-10-CM | POA: Diagnosis not present

## 2022-05-31 NOTE — Patient Outreach (Signed)
  Care Coordination   05/31/2022 Name: Jacqueline Cole MRN: [id number] DOB: [birth year]   Care Coordination Outreach Attempts:  An unsuccessful telephone outreach was attempted for a scheduled appointment today.  Follow Up Plan:  Additional outreach attempts will be made to offer the patient care coordination information and services.   Encounter Outcome:  No Answer   Care Coordination Interventions:  No, not indicated    Quinn Plowman [facility] [place] [phone] direct line

## 2022-06-04 ENCOUNTER — Other Ambulatory Visit: Payer: Self-pay | Admitting: Internal Medicine

## 2022-06-04 DIAGNOSIS — Z1231 Encounter for screening mammogram for malignant neoplasm of breast: Secondary | ICD-10-CM

## 2022-06-10 ENCOUNTER — Telehealth: Payer: Self-pay

## 2022-06-10 NOTE — Progress Notes (Signed)
Care Management & Coordination Services Pharmacy Team  Reason for Encounter: Appointment Reminder  Contacted patient to confirm telephone appointment with [name] , PharmD on 06/15/22 at 11:00. Patient returned call and stated same day appointment in [place] so we rescheduled for   06/23/22 at 12:00  Do you have any problems getting your medications? No Patient uses [facility] delivery service   What is your top health concern you would like to discuss at your upcoming visit?  No concerns at this time   Have you seen any other providers since your last visit with PCP? Yes- nephrology   Hospital visits:  None in previous 6 months   Star Rating Drugs:  Medication:  Last Fill: [facility] Atorvastatin 20mg  06/04/22 30 Jardiance  06/04/22 30 Lantus   05/31/22 29   Care Gaps: Annual wellness visit in last year? Yes  If Diabetic: Last eye exam / retinopathy screening:2019 Last diabetic foot exam:UTD   [name], PharmD notified  Jacqueline Cole, [place] Assistant [phone]

## 2022-06-15 ENCOUNTER — Encounter: Payer: PPO | Admitting: Pharmacist

## 2022-06-23 ENCOUNTER — Ambulatory Visit: Payer: PPO | Admitting: Pharmacist

## 2022-06-23 NOTE — Patient Instructions (Signed)
Visit Information  Phone number for Pharmacist: [phone]  Thank you for meeting with me to discuss your medications! Below is a summary of what we talked about during the visit:    Recommendations: -Advised to switch Lantus dosing to 35 unit BID; advised diet changes to improve post-prandial BG spikes; emphasized NOT taking additional Lantus for high glucose alarms -Rescheduled missed PCP appt  Follow up plan: -Pharmacist follow up televisit scheduled for 6 weeks -PCP appt 06/30/22   Al Corpus, PharmD, BCACP Clinical Pharmacist [facility] [phone]

## 2022-06-23 NOTE — Progress Notes (Signed)
Care Management & Coordination Services Pharmacy Note  06/23/2022 Name:  Jacqueline Cole MRN:  [id number] DOB:  [birth year]  Summary: F/U visit -DM: A1c 8.2% (01/2022) improved from 9%; pt is wearing Freestyle Libre 3. She has been taking Lantus 35u AM, 40 u PM which is different than prescribed (I asked pt last visit to change to 35u aM, 30 u PM due to hypoglycemia overnight); she has unsurprisingly continued to have low glucose alarms overnight Reviewed AGP report: 06/10/22 to 06/23/22. Sensor active: 93%  Time in range (70-180): 32% (goal > 70%)  High (180-250): 30%  Very high (>250): 38%  Low (< 70): 0% (goal < 4%)  GMI: 8.6%; Average glucose: 220 -Reviewed medication options: pt is on 0.75 unit/kg basal insulin which is above recommended maximum 0.5 unit/kg, so would avoid increasing basal insulin further; pt has not tolerated GLP-1 RA (Ozempic N/V led to hypovolemic shock and hospitalization in 11/2020); could consider restarting low dose metformin 500 mg daily (this was stopped same time as Ozempic previously); increasing Jardiance may not help due to GFR < 45; Actos, glipizide, meglitinides or bolus insulin are options for the future  Recommendations: -Advised to switch Lantus dosing to 35 unit BID; advised diet changes to improve post-prandial BG spikes; emphasized NOT taking additional Lantus for high glucose alarms -Rescheduled missed PCP appt  Follow up plan: -Pharmacist follow up televisit scheduled for 6 weeks -PCP appt 06/30/22    Subjective: Jacqueline Cole is an 72 y.o. year old female who is a primary patient of Karie Schwalbe, MD.  The care coordination team was consulted for assistance with disease management and care coordination needs.    Engaged with patient by telephone for follow up visit.  Recent office visits: 01/26/22 Dr Alphonsus Sias OV: f/u - A1c 8.2%; GFR 42 - referred to nephrology.   10/21/21 Dr Alphonsus Sias OV: annual - A1c 9.0. LDL 163. Restarted atorvastatin 20 mg 2x  weekly. RTC 3 months.  Recent consult visits: 05/31/22 Dr Thedore Mins (Nephrology): f/u - no changes. F/u 6 months.  05/03/22 Dr Thedore Mins (Nephrology): new pt - drink 30-40 oz water daily. RTC 4 weeks.  Hospital visits: None in previous 6 months   Objective:  Lab Results  Component Value Date   CREATININE 1.42 (H) 01/26/2022   BUN 11 01/26/2022   GFR 37.43 (L) 01/26/2022   GFRNONAA >60 11/18/2020   GFRAA 66 10/23/2007   NA 139 01/26/2022   K 4.0 01/26/2022   CALCIUM 9.3 01/26/2022   CO2 31 01/26/2022   GLUCOSE 160 (H) 01/26/2022    Lab Results  Component Value Date/Time   HGBA1C 8.2 (A) 01/26/2022 10:59 AM   HGBA1C 9.0 (H) 10/21/2021 10:17 AM   HGBA1C 8.4 (A) 07/10/2021 07:30 AM   HGBA1C 5.5 11/16/2020 11:48 AM   GFR 37.43 (L) 01/26/2022 09:06 AM   GFR 42.88 (L) 10/21/2021 10:17 AM   MICROALBUR 1.2 10/21/2021 10:17 AM   MICROALBUR 4.0 (H) 11/26/2015 09:58 AM    Last diabetic Eye exam:  Lab Results  Component Value Date/Time   HMDIABEYEEXA Retinopathy (A) 04/25/2017 12:00 AM    Last diabetic Foot exam:  Lab Results  Component Value Date/Time   HMDIABFOOTEX done 10/21/2021 12:00 AM     Lab Results  Component Value Date   CHOL 251 (H) 10/21/2021   HDL 38.60 (L) 10/21/2021   LDLCALC 46 06/07/2019   LDLDIRECT 163.0 10/21/2021   TRIG 298.0 (H) 10/21/2021   CHOLHDL 7 10/21/2021  Latest Ref Rng & Units 01/26/2022    9:06 AM 10/21/2021   10:17 AM 11/18/2020    5:02 AM  Hepatic Function  Total Protein 6.0 - 8.3 g/dL  6.9  4.6   Albumin 3.5 - 5.2 g/dL 4.2  4.0    4.0  2.8   AST 0 - 37 U/L  14  23   ALT 0 - 35 U/L  8  13   Alk Phosphatase 39 - 117 U/L  132  51   Total Bilirubin 0.2 - 1.2 mg/dL  0.8  1.4   Bilirubin, Direct 0.0 - 0.3 mg/dL  0.1      Lab Results  Component Value Date/Time   TSH 3.730 11/16/2020 10:21 AM   TSH 3.85 02/01/2018 08:51 AM   TSH 2.54 09/08/2012 09:36 AM   FREET4 1.55 (H) 11/16/2020 10:21 AM   FREET4 1.06 09/09/2020 10:06 AM        Latest Ref Rng & Units 10/21/2021   10:17 AM 11/18/2020    5:02 AM 11/17/2020    6:04 AM  CBC  WBC 4.0 - 10.5 K/uL 9.9  6.1  9.0   Hemoglobin 12.0 - 15.0 g/dL 89.3  73.4  28.7   Hematocrit 36.0 - 46.0 % 43.5  29.2  28.3   Platelets 150.0 - 400.0 K/uL 270.0  174  173     Lab Results  Component Value Date/Time   VITAMINB12 316 09/09/2020 10:06 AM    Clinical ASCVD: No  The 10-year ASCVD risk score (Arnett DK, et al., 2019) is: 18.1%   Values used to calculate the score:     Age: 47 years     Sex: Female     Is Non-Hispanic African American: No     Diabetic: Yes     Tobacco smoker: No     Systolic Blood Pressure: 116 mmHg     Is BP treated: No     HDL Cholesterol: 38.6 mg/dL     Total Cholesterol: 251 mg/dL        08/21/1155    2:62 AM 02/26/2021    9:14 AM 11/13/2020    2:27 PM  Depression screen PHQ 2/9  Decreased Interest 3 3 0  Down, Depressed, Hopeless 1 3 0  PHQ - 2 Score 4 6 0  Altered sleeping  2 0  Tired, decreased energy  3 0  Change in appetite  0 0  Feeling bad or failure about yourself   1 0  Trouble concentrating  0 0  Moving slowly or fidgety/restless  1 0  Suicidal thoughts  0 0  PHQ-9 Score  13 0  Difficult doing work/chores  Somewhat difficult      Social History   Tobacco Use  Smoking Status Former   Types: Cigarettes   Quit date: 03/15/1993   Years since quitting: 29.2  Smokeless Tobacco Never   BP Readings from Last 3 Encounters:  01/26/22 110/70  10/21/21 124/74  07/10/21 136/76   Pulse Readings from Last 3 Encounters:  01/26/22 90  10/21/21 80  07/10/21 75   Wt Readings from Last 3 Encounters:  01/26/22 211 lb (95.7 kg)  10/21/21 209 lb (94.8 kg)  07/10/21 218 lb (98.9 kg)   BMI Readings from Last 3 Encounters:  01/26/22 35.11 kg/m  10/21/21 34.78 kg/m  07/10/21 36.28 kg/m    Allergies  Allergen Reactions   Onglyza [Saxagliptin] Hives and Nausea And Vomiting   Ozempic (0.25 Or 0.5 Mg-Dose) [Semaglutide(0.25 Or 0.5mg -Dos)]  Nausea And Vomiting   Liraglutide Nausea Only    Can't eat, nausea, higher sugars    Medications Reviewed Today     Reviewed by Kathyrn Sheriff, [facility] (Pharmacist) on 06/23/22 at 1246  Med List Status: <None>   Medication Order Taking? Sig Documenting Provider Last Dose Status Informant  atorvastatin (LIPITOR) 20 MG tablet [id number] Yes Take 1 tablet (20 mg total) by mouth 2 (two) times a week. Karie Schwalbe, MD Taking Active   Blood Glucose Monitoring Suppl [facility] VERIO FLEX SYSTEM) w/Device KIT [id number] Yes Use to check blood sugar 2 times a day. Carlus Pavlov, MD Taking Active   Continuous Blood Gluc Sensor (FREESTYLE LIBRE 3 SENSOR) [place] [id number] Yes PLACE 1 SENSOR ON THE SKIN EVERY 14 DAYS Karie Schwalbe, MD Taking Active   cyanocobalamin (VITAMIN B12) 500 MCG tablet [id number] Yes Take 1 tablet (500 mcg total) by mouth daily. Karie Schwalbe, MD Taking Active   empagliflozin (JARDIANCE) 10 MG TABS tablet [id number] Yes Take 1 tablet (10 mg total) by mouth daily before breakfast. Karie Schwalbe, MD Taking Active   gabapentin (NEURONTIN) 300 MG capsule [id number] Yes TAKE TWO CAPSULES BY MOUTH EVERYDAY AT BEDTIME Karie Schwalbe, MD Taking Active   glucose blood [facility] VERIO) test strip [id number] Yes Use as instructed to check blood sugar 2 times a day. Carlus Pavlov, MD Taking Active   hydrocortisone cream 1 % [id number] Yes Apply 1 Application topically daily as needed for itching. Karie Schwalbe, MD Taking Active   insulin glargine (LANTUS) 100 UNIT/ML injection [id number] Yes 35 units in AM, 30 in PM  Patient taking differently: Inject 35 Units into the skin 2 (two) times daily.   Karie Schwalbe, MD Taking Active   Lancets [facility] ULTRASOFT) lancets [id number] Yes Use as instructed to check blood sugar 2 times a day. Carlus Pavlov, MD Taking Active   Propylene Glycol [facility] COMPLETE OP) [id number] Yes Place 3 drops into both eyes 3 (three) times  daily. [provider] Taking Active   trolamine salicylate (ASPERCREME) 10 % cream [id number] Yes Apply 1 Application topically as needed for muscle pain. Apply to feet Karie Schwalbe, MD Taking Active   venlafaxine XR (EFFEXOR-XR) 150 MG 24 hr capsule [id number] Yes TAKE ONE CAPSULE BY MOUTH EVERY MORNING and TAKE ONE CAPSULE BY MOUTH EVERYDAY AT BEDTIME Karie Schwalbe, MD Taking Active             SDOH:  (Social Determinants of Health) assessments and interventions performed: No SDOH Interventions    Flowsheet Row Chronic Care Management from 02/26/2021 in [facility] Chronic Care Management from 03/20/2020 in [facility] [place] [facility] [place]  SDOH Interventions    Transportation Interventions Intervention Not Indicated --  Depression Interventions/Treatment  Medication, Counseling --  Financial Strain Interventions -- Intervention Not Indicated  [Medications affordable]       Medication Assistance: None required.  Patient affirms current coverage meets needs.  Medication Access: Within the past 30 days, how often has patient missed a dose of medication? 0 Is a pillbox or other method used to improve adherence? Yes  Factors that may affect medication adherence? no barriers identified Are meds synced by current pharmacy? Yes  Are meds delivered by current pharmacy? Yes  Does patient experience delays in picking up medications due to transportation concerns? No   [facility] Reviewed: Is patient disadvantaged to use [facility]?: No  Current Rx insurance plan:  HTA Name and location of Current pharmacy:  [facility] [place] - [address] [place] [address] Phone: [phone] Fax: [phone]  [facility] [address] - [place], [place] - [address] [place] [address] Phone: [phone] Fax:  [phone]  [facility] services reviewed with patient today?: Yes  30-day packs: delivery 06/09/22 Venlafaxine ER 150mg   take 1 tablet breakfast and 1 tablet bedtime Gabapentin 300mg  take 2 tablets bedtime Vitamin D3 25mcg  take 1 tablet breakfast Vitamin B12 500mcg take 1 tablet breakfast Jardiance 10mg   take 1 tablet breakfast Atorvastatin 20mg  take 2 times week (wednesday,Sunday breakfast) Lantus-inject 35 units am, 30 pm   Compliance/Adherence/Medication fill history: Care Gaps: Colonoscopy (never done) Eye exam (due 04/2018) - no DM eye exam done [facility]/2020) --Discussed screenings - mammogram, colonoscopy declined.  Star-Rating Drugs: Jardiance - PDC 100% Atorvastatin - PDC 100%   ASSESSMENT / PLAN  Diabetes (A1c goal <8%) -Uncontrolled  - A1c 8.2% (01/2022) above goal, improved from 9%; pt is taking Lantus 35uAM, 40u PM which is differently than prescribed (35 AM, 30 PM) and is having low glucose alarms overnight (3am sometimes) Reviewed AGP report: 06/10/22 to 06/23/22. Sensor active: 93%  Time in range (70-180): 32% (goal > 70%)  High (180-250): 30%  Very high (>250): 38%  Low (< 70): 0% (goal < 4%)  GMI: 8.6%; Average glucose: 220  -Reviewed AGP report: 04/01/22 to 04/14/22. Sensor active: 100%  Time in range (70-180): 34% (goal > 70%)  High (180-250): 31%  Very high (>250): 35%  Low (< 70): 0% (goal < 4%)  GMI: 8.5%; Average glucose: 216  -Current medications: Jardiance 10 mg daily - Appropriate, Query Effective Lantus vial 35/40 units BID (0.75 un/kg) - Appropriate, Query Effective Freestyle Libre 3 (phone) - Appropriate, Effective, Safe, Accessible -Medications previously tried: glipizide (2012-2015), Victoza (n/v), metformin, Ozempic (severe N/V), Onglyza (hives) -Educated on Continuous glucose monitoring; - Libre 3 training completed 11/17/21 -Reviewed CGM data: pt is having high post-prandial BG up to 300s and low  alarms overnight (3-6am) -Reviewed medication options: pt is on 0.75 unit/kg basal insulin which is above recommended maximum 0.5 unit/kg, so would avoid increasing basal insulin further; pt has not tolerated GLP-1 RA (Ozempic N/V led to hypovolemic shock and hospitalization in 11/2020); could consider restarting low dose metformin 500 mg daily (this was stopped same time as Ozempic previously); increasing Jardiance may not help due to GFR < 45; Actos, glipizide, meglitinides or bolus insulin are options for the future -Advised to take Lantus 35 units BID; emphasized NOT taking extra Lantus for high glucose alarms  Hyperlipidemia: (LDL goal < 100) -Uncontrolled - LDL 163 (10/2021) without statin, atorvastatin restarted 10/2021 -Current treatment: Atorvastatin 20 mg twice a week - Appropriate, Query Effective -Medications previously tried: n/a  -Educated on Cholesterol goals; Benefits of statin for ASCVD risk reduction; -Recommended to continue current medication; repeat lipid panel at next OV  Chronic Kidney Disease Stage 3b  -All medications assessed for renal dosing and appropriateness in chronic kidney disease. -Recommended to continue current medication  Health Maintenance -Vaccine gaps: Shingrix, Prevnar, Covid booster -Pt reports pain in legs, legs give out with limited activity; nephrology has suggested PT; advised to discuss with PCP at upcoming appt    Al CorpusLindsey Bianka Liberati, PharmD, BCACP Clinical Pharmacist [facility] [phone]

## 2022-06-29 ENCOUNTER — Telehealth: Payer: Self-pay

## 2022-06-29 NOTE — Progress Notes (Signed)
Care Management & Coordination Services Pharmacy Team  Reason for Encounter: Medication coordination and delivery  Contacted patient to discuss medications and coordinate delivery from [facility]. Spoke with patient on 06/30/2022  spoke with husband Mellody Dance  Cycle dispensing form sent to [facility]  for review.   Last adherence delivery date:06/09/22      Patient is due for next adherence delivery on: 07/09/22  This delivery to include: Adherence Packaging  30 Days  Venlafaxine ER   take 1 tablet breakfast and 1 tablet bedtime Gabapentin  take 2 tablets bedtime Vitamin D3  take 1 tablet breakfast Vitamin B12 take 1 tablet breakfast Jardiance   take 1 tablet breakfast Atorvastatin  take 2 times week (wednesday,Sunday breakfast) Glipizide 2.5mg  - take 1 tablet breakfast Trazodone -take 1-2 tablets bedtime   Vial:  Lantus- inject 35 units twice daily  Triamcinolone cream 0.1%- apply topically 2 times daily  Patient declined the following medications this month: none  No refill request needed.  Confirmed delivery date of 07/09/22, advised patient that pharmacy will contact them the morning of delivery.   Any concerns about your medications? No  How often do you forget or accidentally miss a dose? Never  Do you use a pillbox? No  Is patient in packaging Yes  Any concerns or issues with your packaging? Satisfied with service   Recent blood pressure readings are as follows:none available  Recent blood glucose readings are as follows: none available   Chart review: Recent office visits:  06/30/22-[name],MD(PCP)-f/u DM,using CGM, Stop the benedryl---and use a cetirizine  at bedtime. Also start the trazodone at 1 tab nightly and increase to 2 ( ) after a week if still not sleeping well. Add glipizide 2.5mg  for your diabetes Go back to the miralax 1 capful daily ---and then take 1 dulcolax pill every 3-4 days as  needed. F/u 1 month      Recent consult visits:  05/31/22-Harmeet Singh,MD(neph)-f/u CKD,exam unchanged,recommend sleep study, f/u 6 months   Hospital visits:  None in previous 6 months  Medications: Outpatient Encounter Medications as of 06/29/2022  Medication Sig   atorvastatin (LIPITOR) 20 MG tablet Take 1 tablet (20 mg total) by mouth 2 (two) times a week.   Blood Glucose Monitoring Suppl (ONETOUCH VERIO FLEX SYSTEM) w/Device KIT Use to check blood sugar 2 times a day.   Continuous Blood Gluc Sensor (FREESTYLE LIBRE 3 SENSOR) MISC PLACE 1 SENSOR ON THE SKIN EVERY 14 DAYS   cyanocobalamin (VITAMIN B12) 500 MCG tablet Take 1 tablet (500 mcg total) by mouth daily.   empagliflozin (JARDIANCE) 10 MG TABS tablet Take 1 tablet (10 mg total) by mouth daily before breakfast.   gabapentin (NEURONTIN) 300 MG capsule TAKE TWO CAPSULES BY MOUTH EVERYDAY AT BEDTIME   glucose blood (ONETOUCH VERIO) test strip Use as instructed to check blood sugar 2 times a day.   hydrocortisone cream 1 % Apply 1 Application topically daily as needed for itching.   insulin glargine (LANTUS) 100 UNIT/ML injection 35 units in AM, 30 in PM (Patient taking differently: Inject 35 Units into the skin 2 (two) times daily.)   Lancets (ONETOUCH ULTRASOFT) lancets Use as instructed to check blood sugar 2 times a day.   Propylene Glycol (SYSTANE COMPLETE OP) Place 3 drops into both eyes 3 (three) times daily.   trolamine salicylate (ASPERCREME) 10 % cream Apply 1 Application topically as needed for muscle pain. Apply to feet   venlafaxine XR (EFFEXOR-XR) 150 MG 24 hr  capsule TAKE ONE CAPSULE BY MOUTH EVERY MORNING and TAKE ONE CAPSULE BY MOUTH EVERYDAY AT BEDTIME   No facility-administered encounter medications on file as of 06/29/2022.   BP Readings from Last 3 Encounters:  01/26/22 110/70  10/21/21 124/74  07/10/21 136/76    Pulse Readings from Last 3 Encounters:  01/26/22 90  10/21/21 80  07/10/21 75    Lab Results   Component Value Date/Time   HGBA1C 8.2 (A) 01/26/2022 10:59 AM   HGBA1C 9.0 (H) 10/21/2021 10:17 AM   HGBA1C 8.4 (A) 07/10/2021 07:30 AM   HGBA1C 5.5 11/16/2020 11:48 AM   Lab Results  Component Value Date   CREATININE 1.42 (H) 01/26/2022   BUN 11 01/26/2022   GFR 37.43 (L) 01/26/2022   GFRNONAA >60 11/18/2020   GFRAA 66 10/23/2007   NA 139 01/26/2022   K 4.0 01/26/2022   CALCIUM 9.3 01/26/2022   CO2 31 01/26/2022     Al Corpus, PharmD notified  Burt Knack, [facility] Clinical Pharmacy Assistant [phone]

## 2022-06-30 ENCOUNTER — Encounter: Payer: Self-pay | Admitting: Internal Medicine

## 2022-06-30 ENCOUNTER — Ambulatory Visit (INDEPENDENT_AMBULATORY_CARE_PROVIDER_SITE_OTHER): Payer: PPO | Admitting: Internal Medicine

## 2022-06-30 VITALS — BP 110/78 | HR 89 | Temp 97.9°F | Ht 65.0 in | Wt 220.0 lb

## 2022-06-30 DIAGNOSIS — F331 Major depressive disorder, recurrent, moderate: Secondary | ICD-10-CM | POA: Diagnosis not present

## 2022-06-30 DIAGNOSIS — K5900 Constipation, unspecified: Secondary | ICD-10-CM | POA: Diagnosis not present

## 2022-06-30 DIAGNOSIS — E1142 Type 2 diabetes mellitus with diabetic polyneuropathy: Secondary | ICD-10-CM

## 2022-06-30 LAB — CBC
HCT: 44.3 % (ref 36.0–46.0)
Hemoglobin: 14.6 g/dL (ref 12.0–15.0)
MCHC: 32.9 g/dL (ref 30.0–36.0)
MCV: 87.4 fl (ref 78.0–100.0)
Platelets: 244 10*3/uL (ref 150.0–400.0)
RBC: 5.07 Mil/uL (ref 3.87–5.11)
RDW: 15.2 % (ref 11.5–15.5)
WBC: 8.8 10*3/uL (ref 4.0–10.5)

## 2022-06-30 LAB — RENAL FUNCTION PANEL
Albumin: 4.1 g/dL (ref 3.5–5.2)
BUN: 12 mg/dL (ref 6–23)
CO2: 28 mEq/L (ref 19–32)
Calcium: 9.2 mg/dL (ref 8.4–10.5)
Chloride: 102 mEq/L (ref 96–112)
Creatinine, Ser: 1.48 mg/dL — ABNORMAL HIGH (ref 0.40–1.20)
GFR: 35.51 mL/min — ABNORMAL LOW (ref >60.00)
Glucose, Bld: 105 mg/dL — ABNORMAL HIGH (ref 70–99)
Phosphorus: 3.8 mg/dL (ref 2.3–4.6)
Potassium: 3.6 mEq/L (ref 3.5–5.1)
Sodium: 141 mEq/L (ref 135–145)

## 2022-06-30 LAB — TSH: TSH: 3.73 u[IU]/mL (ref 0.35–5.50)

## 2022-06-30 LAB — HEPATIC FUNCTION PANEL
ALT: 14 U/L (ref 0–35)
AST: 18 U/L (ref 0–37)
Albumin: 4.1 g/dL (ref 3.5–5.2)
Alkaline Phosphatase: 111 U/L (ref 39–117)
Bilirubin, Direct: 0.1 mg/dL (ref 0.0–0.3)
Total Bilirubin: 0.9 mg/dL (ref 0.2–1.2)
Total Protein: 6.3 g/dL (ref 6.0–8.3)

## 2022-06-30 LAB — POCT GLYCOSYLATED HEMOGLOBIN (HGB A1C): Hemoglobin A1C: 8.8 % — AB (ref 4.0–5.6)

## 2022-06-30 MED ORDER — TRIAMCINOLONE ACETONIDE 0.1 % EX CREA: 1.0000 | TOPICAL_CREAM | Freq: Two times a day (BID) | CUTANEOUS | 1 refills | Status: DC | PRN

## 2022-06-30 MED ORDER — TRAZODONE HCL 50 MG PO TABS: 50.0000 mg | ORAL_TABLET | Freq: Every day | ORAL | 11 refills | Status: DC

## 2022-06-30 MED ORDER — GLIPIZIDE ER 2.5 MG PO TB24: 2.5000 mg | ORAL_TABLET | Freq: Every day | ORAL | 3 refills | Status: DC

## 2022-06-30 NOTE — Assessment & Plan Note (Signed)
Discussed miralax daily and dulcolax every 3-4 days prn

## 2022-06-30 NOTE — Patient Instructions (Signed)
Stop the benedryl---and use a cetirizine  at bedtime. Also start the trazodone at 1 tab nightly and increase to 2 ( ) after a week if still not sleeping well. Add glipizide 2.5mg  for your diabetes Go back to the miralax 1 capful daily ---and then take 1 dulcolax pill every 3-4 days as needed.

## 2022-06-30 NOTE — Progress Notes (Signed)
Subjective:    Patient ID: Jacqueline Cole, female    DOB: 07/11/1950, 72 y.o.   MRN: [id number]  HPI Here for follow up of diabetes---poorly controlled With husband  Did see the nephrologist Asked about her thyroid function  Sugars are "up and down" Still using the CGM Hasn't been careful with eating They think her anxiety is affecting her sugars Some depression as well---and stress eats  Having memory issues Weight is back up  Insulin 35 bid Still on jardiance  Current Outpatient Medications on File Prior to Visit  Medication Sig Dispense Refill   atorvastatin (LIPITOR) 20 MG tablet Take 1 tablet (20 mg total) by mouth 2 (two) times a week. 26 tablet 3   Blood Glucose Monitoring Suppl (ONETOUCH VERIO FLEX SYSTEM) w/Device KIT Use to check blood sugar 2 times a day. 1 kit 0   Continuous Blood Gluc Sensor (FREESTYLE LIBRE 3 SENSOR) MISC PLACE 1 SENSOR ON THE SKIN EVERY 14 DAYS 2 each 11   cyanocobalamin (VITAMIN B12) 500 MCG tablet Take 1 tablet (500 mcg total) by mouth daily. 90 tablet 3   empagliflozin (JARDIANCE) 10 MG TABS tablet Take 1 tablet (10 mg total) by mouth daily before breakfast. 90 tablet 3   gabapentin (NEURONTIN) 300 MG capsule TAKE TWO CAPSULES BY MOUTH EVERYDAY AT BEDTIME 180 capsule 3   glucose blood (ONETOUCH VERIO) test strip Use as instructed to check blood sugar 2 times a day. 200 each 12   hydrocortisone cream 1 % Apply 1 Application topically daily as needed for itching. 30 g 5   insulin glargine (LANTUS) 100 UNIT/ML injection 35 units in AM, 30 in PM (Patient taking differently: Inject 35 Units into the skin 2 (two) times daily.) 1 mL 0   Lancets (ONETOUCH ULTRASOFT) lancets Use as instructed to check blood sugar 2 times a day. 200 each 12   Propylene Glycol (SYSTANE COMPLETE OP) Place 3 drops into both eyes 3 (three) times daily.     trolamine salicylate (ASPERCREME) 10 % cream Apply 1 Application topically as needed for muscle pain. Apply to feet 85 g  5   venlafaxine XR (EFFEXOR-XR) 150 MG 24 hr capsule TAKE ONE CAPSULE BY MOUTH EVERY MORNING and TAKE ONE CAPSULE BY MOUTH EVERYDAY AT BEDTIME 180 capsule 3   No current facility-administered medications on file prior to visit.    Allergies  Allergen Reactions   Onglyza [Saxagliptin] Hives and Nausea And Vomiting   Ozempic (0.25 Or 0.5 Mg-Dose) [Semaglutide(0.25 Or 0.5mg -Dos)] Nausea And Vomiting   Liraglutide Nausea Only    Can't eat, nausea, higher sugars    Past Medical History:  Diagnosis Date   Depression    Diabetes mellitus type II    Hyperlipemia    Major depressive disorder, recurrent, in remission 09/01/2006   Qualifier: Diagnosis of  By: [name]     Obesity    Osteopenia    PAD (peripheral artery disease)    on screening   Syncope 1998   DM diagnosis    Past Surgical History:  Procedure Laterality Date   ABDOMINAL HYSTERECTOMY  ~2005   and BSO   BREAST BIOPSY Right 01/05/2019   rt stereo bx 2 areas 1st area ribbon clip   BREAST BIOPSY Right 01/05/2019   rt stereo bx 2nd area coil clip   CATARACT EXTRACTION W/ INTRAOCULAR LENS & ANTERIOR VITRECTOMY Bilateral 01/2022   CHOLECYSTECTOMY  1975   COSMETIC SURGERY     Injury Right face as a  child   ESOPHAGOGASTRODUODENOSCOPY (EGD) WITH PROPOFOL N/A 11/18/2020   Procedure: ESOPHAGOGASTRODUODENOSCOPY (EGD) WITH PROPOFOL;  Surgeon: Wyline Mood, MD;  Location: [facility] ENDOSCOPY;  Service: Gastroenterology;  Laterality: N/A;   VAGINAL DELIVERY     X 2    Family History  Problem Relation Age of Onset   Stroke Father        CVA   Heart disease Father        MI   Cancer Sister        breast cancer   Breast cancer Sister 59   Heart disease Brother        MI   Cancer Brother        Lung    Social History   Socioeconomic History   Marital status: Married    Spouse name: Not on file   Number of children: 2   Years of education: Not on file   Highest education level: Not on file  Occupational History     Comment: "Just up and quit" at [employer]  Tobacco Use   Smoking status: Former    Types: Cigarettes    Quit date: 03/15/1993    Years since quitting: 29.3    Passive exposure: Never   Smokeless tobacco: Never  Vaping Use   Vaping Use: Never used  Substance and Sexual Activity   Alcohol use: No    Alcohol/week: 0.0 standard drinks of alcohol   Drug use: No   Sexual activity: Not on file  Other Topics Concern   Not on file  Social History Narrative   Has living will   Husband, then daughter Arlana Hove, have health care POA   Would reluctantly accept resuscitation but no life prolonging measures   Social Determinants of Health   Financial Resource Strain: Low Risk  (04/03/2020)   Overall Financial Resource Strain (CARDIA)    Difficulty of Paying Living Expenses: Not very hard  Food Insecurity: Not on file  Transportation Needs: No Transportation Needs (02/26/2021)   PRAPARE - Administrator, Civil Service (Medical): No    Lack of Transportation (Non-Medical): No  Physical Activity: Not on file  Stress: Not on file  Social Connections: Not on file  Intimate Partner Violence: Not on file   Review of Systems Sleep is variable---taking 2 benedryl at bedtime Wakes sneezing and coughing Told to stop the advil by nephrologist Constipation---miralax not helping. Using dulcolax pill--weekly    Objective:   Physical Exam Constitutional:      Appearance: Normal appearance.  Neurological:     Mental Status: She is alert.  Psychiatric:     Comments: Mild depressed mood Husband speaks for her a fair bit            Assessment & Plan:

## 2022-06-30 NOTE — Assessment & Plan Note (Signed)
Still depressed despite the venlafaxine  bid Will add trazodone 50-100 at bedtime Stop the benedryl----that along with depression can cause the memory issues Consider adding SSRI next month if not improved

## 2022-06-30 NOTE — Assessment & Plan Note (Signed)
Lab Results  Component Value Date   HGBA1C 8.8 (A) 06/30/2022   Worse Stress eating--and not sleeping well Will add glipizide 2.5mg  to the insulin 35 bid and jardiance 10

## 2022-07-29 ENCOUNTER — Telehealth: Payer: Self-pay

## 2022-07-29 NOTE — Progress Notes (Signed)
Care Management & Coordination Services Pharmacy Team  Reason for Encounter: Medication coordination and delivery  Contacted patient to discuss medications and coordinate delivery from [facility]. Spoke with family on 07/29/2022 spoke with husband Mellody Dance  Cycle dispensing [facility] to [facility]  for review.   Last adherence delivery date:07/09/22      Patient is due for next adherence delivery on: 08/09/22  This delivery to include: Adherence Packaging  30 Days  Venlafaxine ER 150mg   take 1 tablet breakfast and 1 tablet bedtime Gabapentin 300mg  take 2 tablets bedtime Vitamin D3  take 1 tablet breakfast Vitamin B12 take 1 tablet breakfast Jardiance 10mg   take 1 tablet breakfast Atorvastatin 20mg  take 2 times week (wednesday,Sunday breakfast) Glipizide 2.5mg  - take 1 tablet breakfast  Vial: Trazodone 50mg -take 1-2 tablets bedtime  Triamcinolone cream 0.1%- apply topically 2 times daily    Patient declined the following medications this month: none  No refill request needed.  Confirmed delivery date of 08/09/22, advised patient that pharmacy will contact them the morning of delivery.   Any concerns about your medications? No  How often do you forget or accidentally miss a dose? Never  Do you use a pillbox? No  Is patient in packaging Yes Any concerns or issues with your packaging? Satisfied with service    Recent blood pressure readings are as follows:none available   Recent blood glucose readings are as follows:none available    Chart review: Recent office visits:  06/30/22-[name],MD(PCP)-f/u DM,stop benedryl,use cetirizine at bedtime, start trazodone 1 tab nightly,increase to 2 after a week, add glipizide, go back to miralax and take dulcolax as needed.Consider adding SSRI next month if not improved   Recent consult visits:  None since last contact  Hospital visits:  None in previous 6 months  Medications: Outpatient Encounter  Medications as of 07/29/2022  Medication Sig   atorvastatin (LIPITOR) 20 MG tablet Take 1 tablet (20 mg total) by mouth 2 (two) times a week.   Blood Glucose Monitoring Suppl (ONETOUCH VERIO FLEX SYSTEM) w/Device KIT Use to check blood sugar 2 times a day.   Continuous Blood Gluc Sensor (FREESTYLE LIBRE 3 SENSOR) MISC PLACE 1 SENSOR ON THE SKIN EVERY 14 DAYS   cyanocobalamin (VITAMIN B12) 500 MCG tablet Take 1 tablet (500 mcg total) by mouth daily.   empagliflozin (JARDIANCE) 10 MG TABS tablet Take 1 tablet (10 mg total) by mouth daily before breakfast.   gabapentin (NEURONTIN) 300 MG capsule TAKE TWO CAPSULES BY MOUTH EVERYDAY AT BEDTIME   glipiZIDE (GLUCOTROL XL) 2.5 MG 24 hr tablet Take 1 tablet (2.5 mg total) by mouth daily with breakfast.   glucose blood (ONETOUCH VERIO) test strip Use as instructed to check blood sugar 2 times a day.   hydrocortisone cream 1 % Apply 1 Application topically daily as needed for itching.   insulin glargine (LANTUS) 100 UNIT/ML injection 35 units in AM, 30 in PM (Patient taking differently: Inject 35 Units into the skin 2 (two) times daily.)   Lancets (ONETOUCH ULTRASOFT) lancets Use as instructed to check blood sugar 2 times a day.   Propylene Glycol (SYSTANE COMPLETE OP) Place 3 drops into both eyes 3 (three) times daily.   traZODone (DESYREL) 50 MG tablet Take 1-2 tablets (50-100 mg total) by mouth at bedtime.   triamcinolone cream (KENALOG) 0.1 % Apply 1 Application topically 2 (two) times daily as needed.   trolamine salicylate (ASPERCREME) 10 % cream Apply 1 Application topically as needed for muscle  pain. Apply to feet   venlafaxine XR (EFFEXOR-XR) 150 MG 24 hr capsule TAKE ONE CAPSULE BY MOUTH EVERY MORNING and TAKE ONE CAPSULE BY MOUTH EVERYDAY AT BEDTIME   No facility-administered encounter medications on file as of 07/29/2022.   BP Readings from Last 3 Encounters:  06/30/22 110/78  01/26/22 110/70  10/21/21 124/74    Pulse Readings from Last 3  Encounters:  06/30/22 89  01/26/22 90  10/21/21 80    Lab Results  Component Value Date/Time   HGBA1C 8.8 (A) 06/30/2022 07:51 AM   HGBA1C 8.2 (A) 01/26/2022 10:59 AM   HGBA1C 9.0 (H) 10/21/2021 10:17 AM   HGBA1C 5.5 11/16/2020 11:48 AM   Lab Results  Component Value Date   CREATININE 1.48 (H) 06/30/2022   BUN 12 06/30/2022   GFR 35.51 (L) 06/30/2022   GFRNONAA >60 11/18/2020   GFRAA 66 10/23/2007   NA 141 06/30/2022   K 3.6 06/30/2022   CALCIUM 9.2 06/30/2022   CO2 28 06/30/2022     Al Corpus, PharmD notified  Burt Knack, [facility] Clinical Pharmacy Assistant [phone]

## 2022-07-30 ENCOUNTER — Encounter: Payer: Self-pay | Admitting: Internal Medicine

## 2022-07-30 ENCOUNTER — Ambulatory Visit (INDEPENDENT_AMBULATORY_CARE_PROVIDER_SITE_OTHER): Payer: PPO | Admitting: Internal Medicine

## 2022-07-30 VITALS — BP 126/82 | HR 80 | Temp 98.3°F | Ht 65.0 in | Wt 228.2 lb

## 2022-07-30 DIAGNOSIS — E1142 Type 2 diabetes mellitus with diabetic polyneuropathy: Secondary | ICD-10-CM

## 2022-07-30 DIAGNOSIS — F331 Major depressive disorder, recurrent, moderate: Secondary | ICD-10-CM

## 2022-07-30 MED ORDER — FLUOXETINE HCL 20 MG PO CAPS
20.0000 mg | ORAL_CAPSULE | Freq: Every day | ORAL | 3 refills | Status: DC
Start: 2022-07-30 — End: 2022-09-22

## 2022-07-30 NOTE — Assessment & Plan Note (Signed)
Still depressed May be sleeping slightly better with the trazodone--will continue for now On venlafaxine---will try adding fluoxetine 20 every other day--then increase to daily after 2 weeks

## 2022-07-30 NOTE — Patient Instructions (Signed)
Please start the fluoxetine at 20mg  every other day for 2 weeks. If you have no side effects then, increase it to every day

## 2022-07-30 NOTE — Progress Notes (Signed)
Subjective:    Patient ID: Jacqueline Cole, female    DOB: 09/19/1950, 72 y.o.   MRN: [id number]  HPI Here with husband for follow up of poorly controlled diabetes and depression  Sugars are better She has cut back--though weight not down Hasn't been able to get back to shopping Has CGM---states her current average A1c is 6.9% (over 2 weeks) Some sugars under 70 at times but no symptoms  Still "gets livid some times---like the whole world on my shoulder" Irritable  Does get to sleep after a while--sometimes hours.  Trazodone not clearly helping--but seems rested when she gets up Has been getting up earlier since on trazodone---by 9AM  Current Outpatient Medications on File Prior to Visit  Medication Sig Dispense Refill   atorvastatin (LIPITOR) 20 MG tablet Take 1 tablet (20 mg total) by mouth 2 (two) times a week. 26 tablet 3   Blood Glucose Monitoring Suppl (ONETOUCH VERIO FLEX SYSTEM) w/Device KIT Use to check blood sugar 2 times a day. 1 kit 0   Continuous Blood Gluc Sensor (FREESTYLE LIBRE 3 SENSOR) MISC PLACE 1 SENSOR ON THE SKIN EVERY 14 DAYS 2 each 11   cyanocobalamin (VITAMIN B12) 500 MCG tablet Take 1 tablet (500 mcg total) by mouth daily. 90 tablet 3   empagliflozin (JARDIANCE) 10 MG TABS tablet Take 1 tablet (10 mg total) by mouth daily before breakfast. 90 tablet 3   gabapentin (NEURONTIN) 300 MG capsule TAKE TWO CAPSULES BY MOUTH EVERYDAY AT BEDTIME 180 capsule 3   glipiZIDE (GLUCOTROL XL) 2.5 MG 24 hr tablet Take 1 tablet (2.5 mg total) by mouth daily with breakfast. 90 tablet 3   glucose blood (ONETOUCH VERIO) test strip Use as instructed to check blood sugar 2 times a day. 200 each 12   hydrocortisone cream 1 % Apply 1 Application topically daily as needed for itching. 30 g 5   insulin glargine (LANTUS) 100 UNIT/ML injection 35 units in AM, 30 in PM (Patient taking differently: Inject 35 Units into the skin 2 (two) times daily.) 1 mL 0   Lancets (ONETOUCH ULTRASOFT)  lancets Use as instructed to check blood sugar 2 times a day. 200 each 12   Propylene Glycol (SYSTANE COMPLETE OP) Place 3 drops into both eyes 3 (three) times daily.     traZODone (DESYREL) 50 MG tablet Take 1-2 tablets (50-100 mg total) by mouth at bedtime. 60 tablet 11   triamcinolone cream (KENALOG) 0.1 % Apply 1 Application topically 2 (two) times daily as needed. 45 g 1   trolamine salicylate (ASPERCREME) 10 % cream Apply 1 Application topically as needed for muscle pain. Apply to feet 85 g 5   venlafaxine XR (EFFEXOR-XR) 150 MG 24 hr capsule TAKE ONE CAPSULE BY MOUTH EVERY MORNING and TAKE ONE CAPSULE BY MOUTH EVERYDAY AT BEDTIME 180 capsule 3   No current facility-administered medications on file prior to visit.    Allergies  Allergen Reactions   Onglyza [Saxagliptin] Hives and Nausea And Vomiting   Semaglutide(0.25 Or 0.5mg -Dos) Nausea And Vomiting and Other (See Comments)    Extreme dehydration   Liraglutide Nausea Only    Can't eat, nausea, higher sugars    Past Medical History:  Diagnosis Date   Depression    Diabetes mellitus type II    Hyperlipemia    Major depressive disorder, recurrent, in remission (HCC) 09/01/2006   Qualifier: Diagnosis of  By: [name]     Obesity    Osteopenia  PAD (peripheral artery disease) (HCC)    on screening   Syncope 1998   DM diagnosis    Past Surgical History:  Procedure Laterality Date   ABDOMINAL HYSTERECTOMY  ~2005   and BSO   BREAST BIOPSY Right 01/05/2019   rt stereo bx 2 areas 1st area ribbon clip   BREAST BIOPSY Right 01/05/2019   rt stereo bx 2nd area coil clip   CATARACT EXTRACTION W/ INTRAOCULAR LENS & ANTERIOR VITRECTOMY Bilateral 01/2022   CHOLECYSTECTOMY  1975   COSMETIC SURGERY     Injury Right face as a child   ESOPHAGOGASTRODUODENOSCOPY (EGD) WITH PROPOFOL N/A 11/18/2020   Procedure: ESOPHAGOGASTRODUODENOSCOPY (EGD) WITH PROPOFOL;  Surgeon: Wyline Mood, MD;  Location: [facility] ENDOSCOPY;  Service:  Gastroenterology;  Laterality: N/A;   VAGINAL DELIVERY     X 2    Family History  Problem Relation Age of Onset   Stroke Father        CVA   Heart disease Father        MI   Cancer Sister        breast cancer   Breast cancer Sister 77   Heart disease Brother        MI   Cancer Brother        Lung    Social History   Socioeconomic History   Marital status: Married    Spouse name: Not on file   Number of children: 2   Years of education: Not on file   Highest education level: Not on file  Occupational History    Comment: "Just up and quit" at [employer]  Tobacco Use   Smoking status: Former    Types: Cigarettes    Quit date: 03/15/1993    Years since quitting: 29.3    Passive exposure: Never   Smokeless tobacco: Never  Vaping Use   Vaping Use: Never used  Substance and Sexual Activity   Alcohol use: No    Alcohol/week: 0.0 standard drinks of alcohol   Drug use: No   Sexual activity: Not on file  Other Topics Concern   Not on file  Social History Narrative   Has living will   Husband, then daughter Arlana Hove, have health care POA   Would reluctantly accept resuscitation but no life prolonging measures   Social Determinants of Health   Financial Resource Strain: Low Risk  (04/03/2020)   Overall Financial Resource Strain (CARDIA)    Difficulty of Paying Living Expenses: Not very hard  Food Insecurity: Not on file  Transportation Needs: No Transportation Needs (02/26/2021)   PRAPARE - Administrator, Civil Service (Medical): No    Lack of Transportation (Non-Medical): No  Physical Activity: Not on file  Stress: Not on file  Social Connections: Not on file  Intimate Partner Violence: Not on file   Review of Systems Fell and slipped on floor last week Spent 6 hours on the floor---not strong enough and didn't want husband to help her (he couldn't pull her up) Finally got up with blanket wrapped around her    Objective:   Physical Exam Constitutional:       Appearance: Normal appearance.  Neurological:     Mental Status: She is alert.  Psychiatric:     Comments: Is talking for herself more No overt depression            Assessment & Plan:

## 2022-07-30 NOTE — Assessment & Plan Note (Signed)
Control much better per her CGM---on glipizide 2.5 with insulin 35 bid and jardiance 10 Might be mostly better due to better dietary compliance

## 2022-08-03 ENCOUNTER — Telehealth: Payer: Self-pay

## 2022-08-03 NOTE — Progress Notes (Signed)
Care Management & Coordination Services Pharmacy Team  Reason for Encounter: Appointment Reminder  Contacted patient to confirm telephone appointment with Al Corpus , PharmD on 08/06/22 at 11:45. {US HC Outreach:[id number]}  Do you have any problems getting your medications? {yes/no:20286} If yes what types of problems are you experiencing? {Problems:27223}  What is your top health concern you would like to discuss at your upcoming visit?   Have you seen any other providers since your last visit with PCP? No   Hospital visits:  None in previous 6 months   Star Rating Drugs:  Medication:  Last Fill: Day Supply Jardiance 10mg  07/06/22 30 Glipizide 2.5mg  07/06/22 30 Lantus   07/27/22 29 Atorvastatin 20mg  07/06/22 30   Care Gaps: Annual wellness visit in last year? Yes  If Diabetic: Last eye exam / retinopathy screening:2019 Last diabetic foot exam:UTD   Al Corpus, PharmD notified  Burt Knack, [facility] Clinical Pharmacy Assistant [phone]

## 2022-08-06 ENCOUNTER — Telehealth: Payer: Self-pay | Admitting: Pharmacist

## 2022-08-06 ENCOUNTER — Encounter: Payer: PPO | Admitting: Pharmacist

## 2022-08-06 NOTE — Telephone Encounter (Signed)
Care Management & Coordination Services Outreach Note  08/06/2022 Name: Jacqueline Cole MRN: [id number] DOB: [birth year]  Referred by: Karie Schwalbe, MD  Patient had a phone appointment scheduled with clinical pharmacist today.  An unsuccessful telephone outreach was attempted today. The patient was referred to the pharmacist for assistance with medications, care management and care coordination.   Patient will NOT be penalized in any way for missing a Care Management & Coordination Services appointment. The no-show fee does not apply.  If possible, a message was left to return call to: [phone] or to [facility].  Al Corpus, PharmD, BCACP Clinical Pharmacist [facility] [phone]

## 2022-08-06 NOTE — Progress Notes (Unsigned)
Care Management & Coordination Services Pharmacy Note  08/06/2022 Name:  Jacqueline Cole MRN:  [id number] DOB:  01-07-1951  Summary: F/U visit -DM: A1c 8.8% (06/2022); pt is wearing Freestyle Libre 3. She has been taking Lantus 35u AM, 40 u PM which is different than prescribed (I asked pt last visit to change to 35u aM, 30 u PM due to hypoglycemia overnight); she has unsurprisingly continued to have low glucose alarms overnight  Recommendations: -Advised to switch Lantus dosing to 35 unit BID; advised diet changes to improve post-prandial BG spikes; emphasized NOT taking additional Lantus for high glucose alarms -Rescheduled missed PCP appt  Follow up plan: -Pharmacist follow up televisit scheduled for 6 weeks -PCP appt 09/22/22    Subjective: Jacqueline Cole is an 72 y.o. year old female who is a primary patient of Karie Schwalbe, MD.  The care coordination team was consulted for assistance with disease management and care coordination needs.    Engaged with patient by telephone for follow up visit.  Recent office visits: 07/30/22 Dr Alphonsus Sias OV: depression - rx fluoxetine 20 mg daily  06/30/22 Dr Ronnell Gu[place]: A1c 8.8%; Start glipizide 2.5 mg daily; Rx trazodone for sleep. Stop Benadryl, use cetirizine instead. Take Miralax 1 capful daily, take dulcolax every 3-4 days PRN.  01/26/22 Dr Alphonsus Sias OV: f/u - A1c 8.2%; GFR 42 - referred to nephrology.   10/21/21 Dr Alphonsus Sias OV: annual - A1c 9.0. LDL 163. Restarted atorvastatin 20 mg 2x weekly. RTC 3 months.  Recent consult visits: 05/31/22 Dr Thedore Mins (Nephrology): f/u - no changes. F/u 6 months.  05/03/22 Dr Thedore Mins (Nephrology): new pt - drink 30-40 oz water daily. RTC 4 weeks.  Hospital visits: None in previous 6 months   Objective:  Lab Results  Component Value Date   CREATININE 1.48 (H) 06/30/2022   BUN 12 06/30/2022   GFR 35.51 (L) 06/30/2022   GFRNONAA >60 11/18/2020   GFRAA 66 10/23/2007   NA 141 06/30/2022   K 3.6 06/30/2022    CALCIUM 9.2 06/30/2022   CO2 28 06/30/2022   GLUCOSE 105 (H) 06/30/2022    Lab Results  Component Value Date/Time   HGBA1C 8.8 (A) 06/30/2022 07:51 AM   HGBA1C 8.2 (A) 01/26/2022 10:59 AM   HGBA1C 9.0 (H) 10/21/2021 10:17 AM   HGBA1C 5.5 11/16/2020 11:48 AM   GFR 35.51 (L) 06/30/2022 08:29 AM   GFR 37.43 (L) 01/26/2022 09:06 AM   MICROALBUR 1.2 10/21/2021 10:17 AM   MICROALBUR 4.0 (H) 11/26/2015 09:58 AM    Last diabetic Eye exam:  Lab Results  Component Value Date/Time   HMDIABEYEEXA Retinopathy (A) 04/25/2017 12:00 AM    Last diabetic Foot exam:  Lab Results  Component Value Date/Time   HMDIABFOOTEX done 10/21/2021 12:00 AM     Lab Results  Component Value Date   CHOL 251 (H) 10/21/2021   HDL 38.60 (L) 10/21/2021   LDLCALC 46 06/07/2019   LDLDIRECT 163.0 10/21/2021   TRIG 298.0 (H) 10/21/2021   CHOLHDL 7 10/21/2021       Latest Ref Rng & Units 06/30/2022    8:29 AM 01/26/2022    9:06 AM 10/21/2021   10:17 AM  Hepatic Function  Total Protein 6.0 - 8.3 g/dL 6.3   6.9   Albumin 3.5 - 5.2 g/dL 3.5 - 5.2 g/dL 4.1    4.1  4.2  4.0    4.0   AST 0 - 37 U/L 18   14   ALT 0 - 35  U/L 14   8   Alk Phosphatase 39 - 117 U/L 111   132   Total Bilirubin 0.2 - 1.2 mg/dL 0.9   0.8   Bilirubin, Direct 0.0 - 0.3 mg/dL 0.1   0.1     Lab Results  Component Value Date/Time   TSH 3.73 06/30/2022 08:29 AM   TSH 3.730 11/16/2020 10:21 AM   TSH 3.85 02/01/2018 08:51 AM   FREET4 1.55 (H) 11/16/2020 10:21 AM   FREET4 1.06 09/09/2020 10:06 AM       Latest Ref Rng & Units 06/30/2022    8:29 AM 10/21/2021   10:17 AM 11/18/2020    5:02 AM  CBC  WBC 4.0 - 10.5 K/uL 8.8  9.9  6.1   Hemoglobin 12.0 - 15.0 g/dL 40.3  47.4  25.9   Hematocrit 36.0 - 46.0 % 44.3  43.5  29.2   Platelets 150.0 - 400.0 K/uL 244.0  270.0  174     Lab Results  Component Value Date/Time   VITAMINB12 316 09/09/2020 10:06 AM    Clinical ASCVD: No  The 10-year ASCVD risk score (Arnett DK, et al., 2019) is:  20.9%   Values used to calculate the score:     Age: 39 years     Sex: Female     Is Non-Hispanic African American: No     Diabetic: Yes     Tobacco smoker: No     Systolic Blood Pressure: 126 mmHg     Is BP treated: No     HDL Cholesterol: 38.6 mg/dL     Total Cholesterol: 251 mg/dL        5/63/8756    4:33 AM 10/21/2021    9:19 AM 02/26/2021    9:14 AM  Depression screen PHQ 2/9  Decreased Interest 3 3 3   Down, Depressed, Hopeless 3 1 3   PHQ - 2 Score 6 4 6   Altered sleeping 3  2  Tired, decreased energy 3  3  Change in appetite 3  0  Feeling bad or failure about yourself  3  1  Trouble concentrating 3  0  Moving slowly or fidgety/restless 3  1  Suicidal thoughts 3  0  PHQ-9 Score 27  13  Difficult doing work/chores Very difficult  Somewhat difficult     Social History   Tobacco Use  Smoking Status Former   Types: Cigarettes   Quit date: 03/15/1993   Years since quitting: 29.4   Passive exposure: Never  Smokeless Tobacco Never   BP Readings from Last 3 Encounters:  07/30/22 126/82  06/30/22 110/78  01/26/22 110/70   Pulse Readings from Last 3 Encounters:  07/30/22 80  06/30/22 89  01/26/22 90   Wt Readings from Last 3 Encounters:  07/30/22 228 lb 3.2 oz (103.5 kg)  06/30/22 220 lb (99.8 kg)  01/26/22 211 lb (95.7 kg)   BMI Readings from Last 3 Encounters:  07/30/22 37.97 kg/m  06/30/22 36.61 kg/m  01/26/22 35.11 kg/m    Allergies  Allergen Reactions   Onglyza [Saxagliptin] Hives and Nausea And Vomiting   Semaglutide(0.25 Or 0.5mg -Dos) Nausea And Vomiting and Other (See Comments)    Extreme dehydration   Liraglutide Nausea Only    Can't eat, nausea, higher sugars    Medications Reviewed Today     Reviewed by Karie Schwalbe, MD (Physician) on 07/30/22 at 0802  Med List Status: <None>   Medication Order Taking? Sig Documenting Provider Last Dose Status Informant  atorvastatin (  LIPITOR) 20 MG tablet [id number] Yes Take 1 tablet (20 mg  total) by mouth 2 (two) times a week. Karie Schwalbe, MD Taking Active   Blood Glucose Monitoring Suppl [facility] VERIO FLEX SYSTEM) w/Device KIT [id number] Yes Use to check blood sugar 2 times a day. Carlus Pavlov, MD Taking Active   Continuous Blood Gluc Sensor (FREESTYLE LIBRE 3 SENSOR) [place] [id number] Yes PLACE 1 SENSOR ON THE SKIN EVERY 14 DAYS Karie Schwalbe, MD Taking Active   cyanocobalamin (VITAMIN B12) 500 MCG tablet [id number] Yes Take 1 tablet (500 mcg total) by mouth daily. Karie Schwalbe, MD Taking Active   empagliflozin (JARDIANCE) 10 MG TABS tablet [id number] Yes Take 1 tablet (10 mg total) by mouth daily before breakfast. Karie Schwalbe, MD Taking Active   gabapentin (NEURONTIN) 300 MG capsule [id number] Yes TAKE TWO CAPSULES BY MOUTH EVERYDAY AT BEDTIME Karie Schwalbe, MD Taking Active   glipiZIDE (GLUCOTROL XL) 2.5 MG 24 hr tablet [id number] Yes Take 1 tablet (2.5 mg total) by mouth daily with breakfast. Karie Schwalbe, MD Taking Active   glucose blood [facility] VERIO) test strip [id number] Yes Use as instructed to check blood sugar 2 times a day. Carlus Pavlov, MD Taking Active   hydrocortisone cream 1 % [id number] Yes Apply 1 Application topically daily as needed for itching. Karie Schwalbe, MD Taking Active   insulin glargine (LANTUS) 100 UNIT/ML injection [id number] Yes 35 units in AM, 30 in PM  Patient taking differently: Inject 35 Units into the skin 2 (two) times daily.   Karie Schwalbe, MD Taking Active   Lancets [facility] ULTRASOFT) lancets [id number] Yes Use as instructed to check blood sugar 2 times a day. Carlus Pavlov, MD Taking Active   Propylene Glycol [facility] COMPLETE OP) [id number] Yes Place 3 drops into both eyes 3 (three) times daily. [provider] Taking Active   traZODone (DESYREL) 50 MG tablet [id number] Yes Take 1-2 tablets (50-100 mg total) by mouth at bedtime. Karie Schwalbe, MD Taking Active   triamcinolone cream  (KENALOG) 0.1 % [id number] Yes Apply 1 Application topically 2 (two) times daily as needed. Karie Schwalbe, MD Taking Active   trolamine salicylate (ASPERCREME) 10 % cream [id number] Yes Apply 1 Application topically as needed for muscle pain. Apply to feet Karie Schwalbe, MD Taking Active   venlafaxine XR (EFFEXOR-XR) 150 MG 24 hr capsule [id number] Yes TAKE ONE CAPSULE BY MOUTH EVERY MORNING and TAKE ONE CAPSULE BY MOUTH EVERYDAY AT BEDTIME Karie Schwalbe, MD Taking Active             SDOH:  (Social Determinants of Health) assessments and interventions performed: No SDOH Interventions    Flowsheet Row Chronic Care Management from 02/26/2021 in [facility] Chronic Care Management from 03/20/2020 in [facility] [place] [facility] [place]  SDOH Interventions    Transportation Interventions Intervention Not Indicated --  Depression Interventions/Treatment  Medication, Counseling --  Financial Strain Interventions -- Intervention Not Indicated  [Medications affordable]       Medication Assistance: None required.  Patient affirms current coverage meets needs.  Medication Access: Within the past 30 days, how often has patient missed a dose of medication? 0 Is a pillbox or other method used to improve adherence? Yes  Factors that may affect medication adherence? no barriers identified Are meds synced by current pharmacy? Yes  Are meds delivered by current pharmacy? Yes  Does patient experience delays in picking up  medications due to transportation concerns? No  Current Rx insurance plan: HTA Name and location of Current pharmacy:  [facility] - [place], [place] - [address] [place] [address] Phone: [phone] Fax: [phone]  [facility] [address] [place] - [address] [place] [address] Phone: [phone] Fax: [phone]  [facility]  Pharmacy services reviewed with patient today?: Yes  30-day packs: delivery 08/09/22 Venlafaxine ER 150mg   take 1 tablet breakfast and 1 tablet bedtime Gabapentin 300mg  take 2 tablets bedtime Vitamin D3  take 1 tablet breakfast Vitamin B12 take 1 tablet breakfast Jardiance 10mg   take 1 tablet breakfast Atorvastatin 20mg  take 2 times week (wednesday,Sunday breakfast) Glipizide 2.5mg  - take 1 tablet breakfast   Vial: Trazodone 50mg -take 1-2 tablets bedtime  Triamcinolone cream 0.1%- apply topically 2 times daily   Compliance/Adherence/Medication fill history: Care Gaps: Colonoscopy (never done) Eye exam (due 04/2018) - no DM eye exam done 2023 per [facility] Mammogram (due 12/2020) --Discussed screenings - mammogram, colonoscopy declined.  Star-Rating Drugs: Jardiance - PDC 100% Atorvastatin - PDC 100% Glipizide - PDC 86%   ASSESSMENT / PLAN  Diabetes (A1c goal <8%) -Uncontrolled  - A1c 8.2% (01/2022) above goal, improved from 9%; pt is taking Lantus 35uAM, 40u PM which is differently than prescribed (35 AM, 30 PM) and is having low glucose alarms overnight (3am sometimes) -Reviewed AGP report: 07/24/22 to 08/06/22. Sensor active: 88%  Time in range (70-180): 54% (goal > 70%)  High (180-250): 37%  Very high (>250): 6%  Low (< 70): 3% (goal < 4%)  GMI: 7.3%; Average glucose: 166   Previous AGP report: 06/10/22 to 06/23/22. Sensor active: 93%  Time in range (70-180): 32% (goal > 70%)  High (180-250): 30%  Very high (>250): 38%  Low (< 70): 0% (goal < 4%)  GMI: 8.6%; Average glucose: 220  -Current medications: Jardiance 10 mg daily - Appropriate, Query Effective Glipizide XL 2.5 mg daily Lantus vial 35 units BID (0.67 un/kg) - Appropriate, Query Effective Freestyle Libre 3 (phone) - Appropriate, Effective, Safe, Accessible -Medications previously tried: glipizide (2012-2015), Victoza (n/v), metformin, Ozempic (severe N/V), Onglyza (hives) -Educated on  Continuous glucose monitoring; - Libre 3 training completed 11/17/21 -Reviewed CGM data: ***  Hyperlipidemia: (LDL goal < 100) -Uncontrolled - LDL 163 (10/2021) without statin, atorvastatin restarted 10/2021 -Current treatment: Atorvastatin 20 mg twice a week - Appropriate, Query Effective -Medications previously tried: n/a  -Educated on Cholesterol goals; Benefits of statin for ASCVD risk reduction; -Recommended to continue current medication; repeat lipid panel at next OV  Chronic Kidney Disease Stage 3b  -All medications assessed for renal dosing and appropriateness in chronic kidney disease. -Recommended to continue current medication  Depression (Goal: ***) -{US controlled/uncontrolled:25276} -Current treatment: Fluoxetine 20 mg daily Venlafaxine XR 150 mg daily Trazodone 50 mg PRN -Medications previously tried/failed: *** -PHQ9: 27 (07/2022) - severe depression -Connected with PCP for mental health support -Educated on {CCM mental health counseling:25127} -{CCMPHARMDINTERVENTION:25122}  Health Maintenance -Vaccine gaps: Shingrix, Prevnar, Covid booster -Pt reports pain in legs, legs give out with limited activity; nephrology has suggested PT; advised to discuss with PCP at upcoming appt    Al Corpus, PharmD, BCACP Clinical Pharmacist [facility] [phone]

## 2022-08-10 ENCOUNTER — Telehealth: Payer: Self-pay

## 2022-08-10 NOTE — Telephone Encounter (Signed)
Received a signed medical records request for patient medical records from 03/15/2020 to 08/05/2022. Faxed patient EGD report and path to [phone]

## 2022-08-30 ENCOUNTER — Other Ambulatory Visit: Payer: Self-pay | Admitting: Internal Medicine

## 2022-09-22 ENCOUNTER — Ambulatory Visit: Payer: Medicare HMO | Admitting: Internal Medicine

## 2022-09-22 ENCOUNTER — Encounter: Payer: Self-pay | Admitting: Internal Medicine

## 2022-09-22 VITALS — BP 110/70 | HR 80 | Temp 97.2°F | Ht 65.0 in | Wt 222.0 lb

## 2022-09-22 DIAGNOSIS — H6123 Impacted cerumen, bilateral: Secondary | ICD-10-CM | POA: Diagnosis not present

## 2022-09-22 DIAGNOSIS — E1142 Type 2 diabetes mellitus with diabetic polyneuropathy: Secondary | ICD-10-CM

## 2022-09-22 DIAGNOSIS — Z7984 Long term (current) use of oral hypoglycemic drugs: Secondary | ICD-10-CM

## 2022-09-22 DIAGNOSIS — F331 Major depressive disorder, recurrent, moderate: Secondary | ICD-10-CM | POA: Diagnosis not present

## 2022-09-22 MED ORDER — ARIPIPRAZOLE 2 MG PO TABS: 2.0000 mg | ORAL_TABLET | Freq: Every day | ORAL | 1 refills | Status: DC

## 2022-09-22 NOTE — Assessment & Plan Note (Signed)
Based on CGM--control is at least a little better Jardiance 10, glipizide 2.5mg  daily, lantus 35/30 Will check A1c at next appt

## 2022-09-22 NOTE — Progress Notes (Signed)
Subjective:    Patient ID: Jacqueline Cole, female    DOB: Dec 14, 1950, 72 y.o.   MRN: [id number]  HPI Here with husband due to ongoing depression issues  Didn't tolerate the fluoxetine Actually got worse---and then night sweats More irritable and couldn't "handle things"  Has always been depressed "I don't remember ever being happy" Mom killed in MVA early, alcohol in family Has seen psychiatrist in the past---not retired  Hearing is really off--needs to lip read Worse lately (though not new) Occasional tinnitus Larey Seat out of bed yesterday--but no vertigo   Checking sugars sporadically with CGM CGM saying A1c is 8.0  Current Outpatient Medications on File Prior to Visit  Medication Sig Dispense Refill   atorvastatin (LIPITOR) 20 MG tablet Take 1 tablet (20 mg total) by mouth 2 (two) times a week. 26 tablet 3   Blood Glucose Monitoring Suppl (ONETOUCH VERIO FLEX SYSTEM) w/Device KIT Use to check blood sugar 2 times a day. 1 kit 0   Continuous Blood Gluc Sensor (FREESTYLE LIBRE 3 SENSOR) MISC PLACE 1 SENSOR ON THE SKIN EVERY 14 DAYS 2 each 11   cyanocobalamin (VITAMIN B12) 500 MCG tablet Take 1 tablet (500 mcg total) by mouth daily. 90 tablet 3   empagliflozin (JARDIANCE) 10 MG TABS tablet Take 1 tablet (10 mg total) by mouth daily before breakfast. 90 tablet 3   gabapentin (NEURONTIN) 300 MG capsule TAKE TWO CAPSULES BY MOUTH EVERYDAY AT BEDTIME 180 capsule 3   glipiZIDE (GLUCOTROL XL) 2.5 MG 24 hr tablet Take 1 tablet (2.5 mg total) by mouth daily with breakfast. 90 tablet 3   glucose blood (ONETOUCH VERIO) test strip Use as instructed to check blood sugar 2 times a day. 200 each 12   hydrocortisone cream 1 % Apply 1 Application topically daily as needed for itching. 30 g 5   insulin glargine (LANTUS) 100 UNIT/ML injection 35 units in AM, 30 in PM (Patient taking differently: Inject 35 Units into the skin 2 (two) times daily.) 1 mL 0   Lancets (ONETOUCH ULTRASOFT) lancets Use as  instructed to check blood sugar 2 times a day. 200 each 12   Propylene Glycol (SYSTANE COMPLETE OP) Place 3 drops into both eyes 3 (three) times daily.     traZODone (DESYREL) 50 MG tablet Take 1-2 tablets (50-100 mg total) by mouth at bedtime. 60 tablet 11   triamcinolone cream (KENALOG) 0.1 % APPLY TO THE AFFECTED AREA(S) TOPICALLY twice daily AS NEEDED 45 g 0   trolamine salicylate (ASPERCREME) 10 % cream Apply 1 Application topically as needed for muscle pain. Apply to feet 85 g 5   venlafaxine XR (EFFEXOR-XR) 150 MG 24 hr capsule TAKE ONE CAPSULE BY MOUTH EVERY MORNING and TAKE ONE CAPSULE BY MOUTH EVERYDAY AT BEDTIME 180 capsule 3   No current facility-administered medications on file prior to visit.    Allergies  Allergen Reactions   Onglyza [Saxagliptin] Hives and Nausea And Vomiting   Semaglutide(0.25 Or 0.5mg -Dos) Nausea And Vomiting and Other (See Comments)    Extreme dehydration   Liraglutide Nausea Only    Can't eat, nausea, higher sugars    Past Medical History:  Diagnosis Date   Depression    Diabetes mellitus type II    Hyperlipemia    Major depressive disorder, recurrent, in remission (HCC) 09/01/2006   Qualifier: Diagnosis of  By: [name]     Obesity    Osteopenia    PAD (peripheral artery disease) (HCC)  on screening   Syncope 1998   DM diagnosis    Past Surgical History:  Procedure Laterality Date   ABDOMINAL HYSTERECTOMY  ~2005   and BSO   BREAST BIOPSY Right 01/05/2019   rt stereo bx 2 areas 1st area ribbon clip   BREAST BIOPSY Right 01/05/2019   rt stereo bx 2nd area coil clip   CATARACT EXTRACTION W/ INTRAOCULAR LENS & ANTERIOR VITRECTOMY Bilateral 01/2022   CHOLECYSTECTOMY  1975   COSMETIC SURGERY     Injury Right face as a child   ESOPHAGOGASTRODUODENOSCOPY (EGD) WITH PROPOFOL N/A 11/18/2020   Procedure: ESOPHAGOGASTRODUODENOSCOPY (EGD) WITH PROPOFOL;  Surgeon: Wyline Mood, MD;  Location: [facility] ENDOSCOPY;  Service: Gastroenterology;   Laterality: N/A;   VAGINAL DELIVERY     X 2    Family History  Problem Relation Age of Onset   Stroke Father        CVA   Heart disease Father        MI   Cancer Sister        breast cancer   Breast cancer Sister 65   Heart disease Brother        MI   Cancer Brother        Lung    Social History   Socioeconomic History   Marital status: Married    Spouse name: Not on file   Number of children: 2   Years of education: Not on file   Highest education level: Not on file  Occupational History    Comment: "Just up and quit" at [employer]  Tobacco Use   Smoking status: Former    Types: Cigarettes    Quit date: 03/15/1993    Years since quitting: 29.5    Passive exposure: Never   Smokeless tobacco: Never  Vaping Use   Vaping Use: Never used  Substance and Sexual Activity   Alcohol use: No    Alcohol/week: 0.0 standard drinks of alcohol   Drug use: No   Sexual activity: Not on file  Other Topics Concern   Not on file  Social History Narrative   Has living will   Husband, then daughter Arlana Hove, have health care POA   Would reluctantly accept resuscitation but no life prolonging measures   Social Determinants of Health   Financial Resource Strain: Low Risk  (04/03/2020)   Overall Financial Resource Strain (CARDIA)    Difficulty of Paying Living Expenses: Not very hard  Food Insecurity: Not on file  Transportation Needs: No Transportation Needs (02/26/2021)   PRAPARE - Administrator, Civil Service (Medical): No    Lack of Transportation (Non-Medical): No  Physical Activity: Not on file  Stress: Not on file  Social Connections: Not on file  Intimate Partner Violence: Not on file   Review of Systems Sleeping better now Appetite is big    Objective:   Physical Exam Constitutional:      Appearance: Normal appearance.  HENT:     Ears:     Comments: Cerumen occluding on left, and ~80% on right Neurological:     Mental Status: She is alert.   Psychiatric:     Comments: Normal appearance and speech No sig overt depressed mood Appropriate affect            Assessment & Plan:

## 2022-09-22 NOTE — Assessment & Plan Note (Addendum)
Will try to flush out to see if that helps her hearing Improved with clearing out

## 2022-09-22 NOTE — Assessment & Plan Note (Signed)
Has been basically life long Has done well with venlafaxine but now not controlling her Has real irritable edge but no true mania--and got worse with fluoxetine. I wonder if she could be bipolar 2 Will continue the venlafaxine Trial of aripiprazole 2mg  at bedtime---watch for increased eating, etc Will refer back to psychiatry

## 2022-09-23 ENCOUNTER — Other Ambulatory Visit: Payer: Self-pay | Admitting: Internal Medicine

## 2022-09-24 ENCOUNTER — Encounter: Payer: Self-pay | Admitting: *Deleted

## 2022-10-04 ENCOUNTER — Other Ambulatory Visit: Payer: Self-pay | Admitting: Internal Medicine

## 2022-10-06 ENCOUNTER — Telehealth: Payer: Self-pay | Admitting: Internal Medicine

## 2022-10-06 NOTE — Telephone Encounter (Signed)
Noted---I am sorry to hear she didn't want an appointment

## 2022-10-06 NOTE — Telephone Encounter (Signed)
Dr Maryruth Bun office called to report that patient declined an appointment with them. She said that they are not in network with her secondary insurance,however,she declined an appointment with them.

## 2022-10-06 NOTE — Telephone Encounter (Signed)
Patient husband called in and wanted to know if another referral could be put in somewhere else that accept [facility]. Thank you!

## 2022-10-13 NOTE — Telephone Encounter (Signed)
Patient's husband contacted the office regarding this request, wondered if there was any update? Says that it is becoming more urgent for her to be seen by psychiatry, please advise husband when it is placed. They will need somewhere that accepts [facility]. Thanks!

## 2022-10-13 NOTE — Telephone Encounter (Signed)
I have sent her referral to [facility] to see if able to accept. They have to review the referral and will contact her to schedule or will contact [facility] if unable to see the patient. I have responded to the patient's mychart message with referral information. See referral notes/mychart message for updates.

## 2022-10-25 ENCOUNTER — Encounter: Payer: Self-pay | Admitting: *Deleted

## 2022-10-25 ENCOUNTER — Ambulatory Visit (INDEPENDENT_AMBULATORY_CARE_PROVIDER_SITE_OTHER): Payer: Medicare HMO | Admitting: Internal Medicine

## 2022-10-25 ENCOUNTER — Encounter: Payer: Self-pay | Admitting: Internal Medicine

## 2022-10-25 ENCOUNTER — Other Ambulatory Visit: Payer: Self-pay | Admitting: Internal Medicine

## 2022-10-25 VITALS — BP 120/70 | HR 90 | Temp 97.0°F | Wt 227.0 lb

## 2022-10-25 DIAGNOSIS — Z7984 Long term (current) use of oral hypoglycemic drugs: Secondary | ICD-10-CM | POA: Diagnosis not present

## 2022-10-25 DIAGNOSIS — E1142 Type 2 diabetes mellitus with diabetic polyneuropathy: Secondary | ICD-10-CM

## 2022-10-25 DIAGNOSIS — F331 Major depressive disorder, recurrent, moderate: Secondary | ICD-10-CM | POA: Diagnosis not present

## 2022-10-25 DIAGNOSIS — D329 Benign neoplasm of meninges, unspecified: Secondary | ICD-10-CM | POA: Diagnosis not present

## 2022-10-25 LAB — POCT GLYCOSYLATED HEMOGLOBIN (HGB A1C): Hemoglobin A1C: 8.5 % — AB (ref 4.0–5.6)

## 2022-10-25 MED ORDER — ARIPIPRAZOLE 5 MG PO TABS: 5.0000 mg | ORAL_TABLET | Freq: Every day | ORAL | 1 refills | Status: DC

## 2022-10-25 MED ORDER — EMPAGLIFLOZIN 25 MG PO TABS: 25.0000 mg | ORAL_TABLET | Freq: Every day | ORAL | 1 refills | Status: DC

## 2022-10-25 NOTE — Assessment & Plan Note (Addendum)
Has had some cravings but CGM suggests still reasonable control A1c here was 8.5% though Will increase jardiance to 25 Glipizide 2.5, lantus 35/30

## 2022-10-25 NOTE — Assessment & Plan Note (Signed)
They don't notice significant improvement--still irritable and depressed---but clearly calmer here than last month Will continue the venlafaxine Try increasing the aripiprazole to 5mg  at bedtime Has psychiatry appointment at beginning of October

## 2022-10-25 NOTE — Addendum Note (Signed)
Addended by: Eual Fines on: 10/25/2022 11:08 AM   Modules accepted: Orders

## 2022-10-25 NOTE — Assessment & Plan Note (Signed)
Has some head pressure and cognitive changes Will check MRI to be sure no sig change in meningioma

## 2022-10-25 NOTE — Progress Notes (Signed)
Subjective:    Patient ID: Jacqueline Cole, female    DOB: 09/20/1950, 72 y.o.   MRN: [id number]  HPI Here with husband to follow up on depression  Has tolerated the aripiprazole Husband notes that her memory is not so good--and that frustrates her Still has the irritability She has been hearing things that husband doesn't here---likes voices outside the window (this predated the abilify) Did finally get an appt with psychiatry---October  Depression seems just as bad "Like something just haunting you" No suicidal ideatioin  Has had some cravings and increase in appetite with the medication Did gain a few pounds CGM saying A1c in under 8%  Some head pressure  Cognitive changes They wonder about the meningioma  Current Outpatient Medications on File Prior to Visit  Medication Sig Dispense Refill   ARIPiprazole (ABILIFY) 2 MG tablet Take 1 tablet (2 mg total) by mouth at bedtime. 30 tablet 1   atorvastatin (LIPITOR) 20 MG tablet TAKE ONE TABLET BY MOUTH ONCE WEEKLY ON WEDNESDAY and TAKE ONE TABLET BY MOUTH ONCE WEEKLY ON SUNDAY 26 tablet 3   Blood Glucose Monitoring Suppl (ONETOUCH VERIO FLEX SYSTEM) w/Device KIT Use to check blood sugar 2 times a day. 1 kit 0   Continuous Blood Gluc Sensor (FREESTYLE LIBRE 3 SENSOR) MISC PLACE 1 SENSOR ON THE SKIN EVERY 14 DAYS 2 each 11   cyanocobalamin (VITAMIN B12) 500 MCG tablet Take 1 tablet (500 mcg total) by mouth daily. 90 tablet 3   empagliflozin (JARDIANCE) 10 MG TABS tablet Take 1 tablet (10 mg total) by mouth daily before breakfast. 90 tablet 3   gabapentin (NEURONTIN) 300 MG capsule TAKE TWO CAPSULES BY MOUTH EVERYDAY AT BEDTIME 180 capsule 3   glipiZIDE (GLUCOTROL XL) 2.5 MG 24 hr tablet Take 1 tablet (2.5 mg total) by mouth daily with breakfast. 90 tablet 3   glucose blood (ONETOUCH VERIO) test strip Use as instructed to check blood sugar 2 times a day. 200 each 12   hydrocortisone cream 1 % Apply 1 Application topically daily as  needed for itching. 30 g 5   insulin glargine (LANTUS) 100 UNIT/ML injection 35 units in AM, 30 in PM (Patient taking differently: Inject 35 Units into the skin 2 (two) times daily.) 1 mL 0   Lancets (ONETOUCH ULTRASOFT) lancets Use as instructed to check blood sugar 2 times a day. 200 each 12   Propylene Glycol (SYSTANE COMPLETE OP) Place 3 drops into both eyes 3 (three) times daily.     traZODone (DESYREL) 50 MG tablet Take 1-2 tablets (50-100 mg total) by mouth at bedtime. 60 tablet 11   triamcinolone cream (KENALOG) 0.1 % Apply TO THE affected area(s) TOPICALLY twice daily AS NEEDED 45 g 0   trolamine salicylate (ASPERCREME) 10 % cream Apply 1 Application topically as needed for muscle pain. Apply to feet 85 g 5   venlafaxine XR (EFFEXOR-XR) 150 MG 24 hr capsule TAKE ONE CAPSULE BY MOUTH EVERY MORNING and TAKE ONE CAPSULE BY MOUTH EVERYDAY AT BEDTIME 180 capsule 3   No current facility-administered medications on file prior to visit.    Allergies  Allergen Reactions   Onglyza [Saxagliptin] Hives and Nausea And Vomiting   Semaglutide(0.25 Or 0.5mg -Dos) Nausea And Vomiting and Other (See Comments)    Extreme dehydration   Liraglutide Nausea Only    Can't eat, nausea, higher sugars    Past Medical History:  Diagnosis Date   Depression    Diabetes mellitus type II  Hyperlipemia    Major depressive disorder, recurrent, in remission (HCC) 09/01/2006   Qualifier: Diagnosis of  By: [name]     Obesity    Osteopenia    PAD (peripheral artery disease) (HCC)    on screening   Syncope 1998   DM diagnosis    Past Surgical History:  Procedure Laterality Date   ABDOMINAL HYSTERECTOMY  ~2005   and BSO   BREAST BIOPSY Right 01/05/2019   rt stereo bx 2 areas 1st area ribbon clip   BREAST BIOPSY Right 01/05/2019   rt stereo bx 2nd area coil clip   CATARACT EXTRACTION W/ INTRAOCULAR LENS & ANTERIOR VITRECTOMY Bilateral 01/2022   CHOLECYSTECTOMY  1975   COSMETIC SURGERY      Injury Right face as a child   ESOPHAGOGASTRODUODENOSCOPY (EGD) WITH PROPOFOL N/A 11/18/2020   Procedure: ESOPHAGOGASTRODUODENOSCOPY (EGD) WITH PROPOFOL;  Surgeon: Wyline Mood, MD;  Location: [facility] ENDOSCOPY;  Service: Gastroenterology;  Laterality: N/A;   VAGINAL DELIVERY     X 2    Family History  Problem Relation Age of Onset   Stroke Father        CVA   Heart disease Father        MI   Cancer Sister        breast cancer   Breast cancer Sister 94   Heart disease Brother        MI   Cancer Brother        Lung    Social History   Socioeconomic History   Marital status: Married    Spouse name: Not on file   Number of children: 2   Years of education: Not on file   Highest education level: Not on file  Occupational History    Comment: "Just up and quit" at [employer]  Tobacco Use   Smoking status: Former    Current packs/day: 0.00    Types: Cigarettes    Quit date: 03/15/1993    Years since quitting: 29.6    Passive exposure: Never   Smokeless tobacco: Never  Vaping Use   Vaping status: Never Used  Substance and Sexual Activity   Alcohol use: No    Alcohol/week: 0.0 standard drinks of alcohol   Drug use: No   Sexual activity: Not on file  Other Topics Concern   Not on file  Social History Narrative   Has living will   Husband, then daughter Arlana Hove, have health care POA   Would reluctantly accept resuscitation but no life prolonging measures   Social Determinants of Health   Financial Resource Strain: Low Risk  (04/03/2020)   Overall Financial Resource Strain (CARDIA)    Difficulty of Paying Living Expenses: Not very hard  Food Insecurity: Not on file  Transportation Needs: No Transportation Needs (02/26/2021)   PRAPARE - Administrator, Civil Service (Medical): No    Lack of Transportation (Non-Medical): No  Physical Activity: Not on file  Stress: Not on file  Social Connections: Not on file  Intimate Partner Violence: Not on file   Review of  Systems Hearing is much better---flushing out really helped Sleeps okay    Objective:   Physical Exam         Assessment & Plan:

## 2022-11-02 ENCOUNTER — Telehealth: Payer: Self-pay | Admitting: Internal Medicine

## 2022-11-02 NOTE — Telephone Encounter (Signed)
This is already approved.  Notes an appt desk and within referral order  Auth# [id number]

## 2022-11-02 NOTE — Telephone Encounter (Signed)
Brandy from [facility] called over and stated that the patient is scheduled to have a MRI on August 22nd and they are needing an [place]. Thank you!

## 2022-11-04 ENCOUNTER — Ambulatory Visit
Admission: RE | Admit: 2022-11-04 | Discharge: 2022-11-04 | Disposition: A | Discharge status: Home / Self Care | Payer: Medicare HMO | Source: Ambulatory Visit | Attending: Internal Medicine | Admitting: Internal Medicine

## 2022-11-04 ENCOUNTER — Other Ambulatory Visit: Payer: Self-pay | Admitting: Internal Medicine

## 2022-11-04 DIAGNOSIS — D320 Benign neoplasm of cerebral meninges: Secondary | ICD-10-CM | POA: Diagnosis not present

## 2022-11-04 DIAGNOSIS — D329 Benign neoplasm of meninges, unspecified: Secondary | ICD-10-CM

## 2022-11-04 DIAGNOSIS — I6782 Cerebral ischemia: Secondary | ICD-10-CM | POA: Diagnosis not present

## 2022-11-04 DIAGNOSIS — G935 Compression of brain: Secondary | ICD-10-CM | POA: Diagnosis not present

## 2022-11-04 DIAGNOSIS — G936 Cerebral edema: Secondary | ICD-10-CM | POA: Diagnosis not present

## 2022-11-04 MED ORDER — GADOBUTROL 1 MMOL/ML IV SOLN
10.0000 mL | Freq: Once | INTRAVENOUS | Status: AC | PRN
  Administered 2022-11-04: 10 mL via INTRAVENOUS

## 2022-11-04 NOTE — Progress Notes (Signed)
PC from Dr Renette Butters MRI shows enlargement of meningioma with midline shift, etc Will set up urgently with neurosurgery

## 2022-11-05 NOTE — H&P (View-Only) (Signed)
Referring Physician:  Karie Schwalbe, MD [address] [place],  [place] [address]  Primary Physician:  Karie Schwalbe, MD  History of Present Illness: 11/09/2022 [name] is here today with a chief complaint of memory loss over the last 3 to 6 months.  Her husband and sister have noticed this.  She previously was very sharp.  Approximately 2 years ago, she had an MRI scan which showed a meningioma.  Due to these concerns, she obtained a repeat MRI scan last week.  This showed increased growth of the lesion.  She presents today for evaluation.  She denies headaches, nausea, or vomiting.  She is aware that she is having some trouble with her memory.  She has been lost at times.  She has had some loss of executive functioning.   Jacqueline Cole has no symptoms of cervical myelopathy.  The symptoms are causing a significant impact on the patient's life.   I have utilized the care everywhere function in epic to review the outside records available from external health systems.  Review of Systems:  A 10 point review of systems is negative, except for the pertinent positives and negatives detailed in the HPI.  Past Medical History: Past Medical History:  Diagnosis Date   Depression    Diabetes mellitus type II    Hyperlipemia    Major depressive disorder, recurrent, in remission (HCC) 09/01/2006   Qualifier: Diagnosis of  By: [name]     Obesity    Osteopenia    PAD (peripheral artery disease) (HCC)    on screening   Syncope 1998   DM diagnosis    Past Surgical History: Past Surgical History:  Procedure Laterality Date   ABDOMINAL HYSTERECTOMY  ~2005   and BSO   BREAST BIOPSY Right 01/05/2019   rt stereo bx 2 areas 1st area ribbon clip   BREAST BIOPSY Right 01/05/2019   rt stereo bx 2nd area coil clip   CATARACT EXTRACTION W/ INTRAOCULAR LENS & ANTERIOR VITRECTOMY Bilateral 01/2022   CHOLECYSTECTOMY  1975   COSMETIC SURGERY     Injury Right  face as a child   ESOPHAGOGASTRODUODENOSCOPY (EGD) WITH PROPOFOL N/A 11/18/2020   Procedure: ESOPHAGOGASTRODUODENOSCOPY (EGD) WITH PROPOFOL;  Surgeon: Wyline Mood, MD;  Location: [facility] ENDOSCOPY;  Service: Gastroenterology;  Laterality: N/A;   VAGINAL DELIVERY     X 2    Allergies: Allergies as of 11/09/2022 - Review Complete 11/09/2022  Allergen Reaction Noted   Onglyza [saxagliptin] Hives and Nausea And Vomiting 11/26/2015   Semaglutide(0.25 or 0.5mg -dos) Nausea And Vomiting and Other (See Comments) 11/02/2021   Liraglutide Nausea Only 07/07/2016    Medications:  Current Outpatient Medications:    ARIPiprazole (ABILIFY) 5 MG tablet, Take 1 tablet (5 mg total) by mouth daily., Disp: 90 tablet, Rfl: 1   atorvastatin (LIPITOR) 20 MG tablet, TAKE ONE TABLET BY MOUTH ONCE WEEKLY ON WEDNESDAY and TAKE ONE TABLET BY MOUTH ONCE WEEKLY ON SUNDAY, Disp: 26 tablet, Rfl: 3   Blood Glucose Monitoring Suppl (ONETOUCH VERIO FLEX SYSTEM) w/Device KIT, Use to check blood sugar 2 times a day., Disp: 1 kit, Rfl: 0   cephALEXin (KEFLEX) 500 MG capsule, Take 1 capsule (500 mg total) by mouth 3 (three) times daily for 7 days., Disp: 21 capsule, Rfl: 0   Continuous Blood Gluc Sensor (FREESTYLE LIBRE 3 SENSOR) MISC, PLACE 1 SENSOR ON THE SKIN EVERY 14 DAYS, Disp: 2 each, Rfl: 11   empagliflozin (JARDIANCE)  25 MG TABS tablet, Take 1 tablet (25 mg total) by mouth daily before breakfast., Disp: 90 tablet, Rfl: 1   gabapentin (NEURONTIN) 300 MG capsule, TAKE TWO CAPSULES BY MOUTH EVERYDAY AT BEDTIME, Disp: 180 capsule, Rfl: 3   glipiZIDE (GLUCOTROL XL) 2.5 MG 24 hr tablet, Take 1 tablet (2.5 mg total) by mouth daily with breakfast., Disp: 90 tablet, Rfl: 3   glucose blood (ONETOUCH VERIO) test strip, Use as instructed to check blood sugar 2 times a day., Disp: 200 each, Rfl: 12   hydrocortisone cream 1 %, Apply 1 Application topically daily as needed for itching., Disp: 30 g, Rfl: 5   insulin glargine (LANTUS) 100  UNIT/ML injection, 35 units in AM, 30 in PM (Patient taking differently: Inject 35 Units into the skin 2 (two) times daily.), Disp: 1 mL, Rfl: 0   Lancets (ONETOUCH ULTRASOFT) lancets, Use as instructed to check blood sugar 2 times a day., Disp: 200 each, Rfl: 12   Propylene Glycol (SYSTANE COMPLETE OP), Place 3 drops into both eyes 3 (three) times daily., Disp: , Rfl:    traZODone (DESYREL) 50 MG tablet, Take 1-2 tablets (50-100 mg total) by mouth at bedtime., Disp: 60 tablet, Rfl: 11   triamcinolone cream (KENALOG) 0.1 %, Apply TO THE affected area(s) TOPICALLY twice daily AS NEEDED, Disp: 45 g, Rfl: 0   trolamine salicylate (ASPERCREME) 10 % cream, Apply 1 Application topically as needed for muscle pain. Apply to feet, Disp: 85 g, Rfl: 5   venlafaxine XR (EFFEXOR-XR) 150 MG 24 hr capsule, TAKE ONE CAPSULE BY MOUTH EVERY MORNING and TAKE ONE CAPSULE BY MOUTH EVERYDAY AT BEDTIME, Disp: 180 capsule, Rfl: 3   vitamin B-12 (CYANOCOBALAMIN) 500 MCG tablet, TAKE ONE TABLET BY MOUTH EVERY MORNING, Disp: 90 tablet, Rfl: 3  Social History: Social History   Tobacco Use   Smoking status: Former    Current packs/day: 0.00    Types: Cigarettes    Quit date: 03/15/1993    Years since quitting: 29.6    Passive exposure: Never   Smokeless tobacco: Never  Vaping Use   Vaping status: Never Used  Substance Use Topics   Alcohol use: No    Alcohol/week: 0.0 standard drinks of alcohol   Drug use: No    Family Medical History: Family History  Problem Relation Age of Onset   Stroke Father        CVA   Heart disease Father        MI   Cancer Sister        breast cancer   Breast cancer Sister 33   Heart disease Brother        MI   Cancer Brother        Lung    Physical Examination: Vitals:   11/09/22 0840  BP: 130/72    General: Patient is in no apparent distress. Attention to examination is appropriate.  Neck:   Supple.  Full range of motion.  Respiratory: Patient is breathing without  any difficulty.   NEUROLOGICAL:     Awake, alert, oriented to person, place, and time.  Requires choices for month and year.  She has some clear memory deficits.  Speech is clear and fluent.   Cranial Nerves: Pupils equal round and reactive to light.  Facial tone is symmetric.  Facial sensation is symmetric. Shoulder shrug is symmetric. Tongue protrusion is midline.  There is no pronator drift.  Strength: Side Biceps Triceps Deltoid Interossei Grip Wrist Ext. Wrist Flex.  R 5 5 5 5 5 5 5   L 5 5 5 5 5 5 5    Side Iliopsoas Quads Hamstring PF DF EHL  R 5 5 5 5 5 5   L 5 5 5 5 5 5    Reflexes are 1+ and symmetric at the biceps, triceps, brachioradialis, patella and achilles.   Hoffman's is absent.   Bilateral upper and lower extremity sensation is intact to light touch.    No evidence of dysmetria noted.  Gait is normal.     Medical Decision Making  Imaging: MRI Brain 11/04/2022 IMPRESSION: 1. 6.0 x 4.5 x 4.1 cm dural-based mass at the right frontal convexity, significantly increased in size from the prior MRI of 11/16/2020 and most consistent with a meningioma. Progressive mass effect upon the underlying brain parenchyma now with mild underlying parenchymal edema. 8 mm leftward midline shift is new from the prior exam, and there is now a degree of leftward subfalcine herniation. Neurosurgical consultation is recommended. 2. Background moderate chronic small vessel ischemic changes within the cerebral white matter and pons, similar to the prior MRI.     Electronically Signed   By: Jackey Loge D.O.   On: 11/04/2022 13:54  I have personally reviewed the images and agree with the above interpretation.  Assessment and Plan: Jacqueline Cole is a pleasant 72 y.o. female with enlarging right sided dural based mass most consistent with meningioma.  This brain tumor has grown substantially in size.  There is now right to left midline shift with compression of her brain.  I discussed with  the patient and her family that this likely represents a meningioma, but has shown substantial growth over the past 2 years.  At this point, this could be causing her memory changes and personality changes.  Due to the increase in size, I recommended surgical intervention for resection of this tumor.  I do not think that she has full capacity to give consent at this point.  I discussed this with her husband present, and asked that the patient give [place] send while her husband provides consent.  They are on the same page and would like to move forward with surgical intervention.  I discussed the planned procedure at length with the patient, including the risks, benefits, alternatives, and indications. The risks discussed include but are not limited to bleeding, infection, need for reoperation, spinal fluid leak, stroke, vision loss, anesthetic complication, coma, paralysis, and even death. I also described in detail that improvement was not guaranteed.  The patient and her husband expressed understanding of these risks, and asked that we proceed with surgery. I described the surgery in layman's terms, and gave ample opportunity for questions, which were answered to the best of my ability.     Thank you for involving me in the care of this patient.      Blessin Kanno K. Myer Haff MD, [facility]

## 2022-11-05 NOTE — Progress Notes (Unsigned)
Referring Physician:  Karie Schwalbe, MD [address] [place],  [place] [address]  Primary Physician:  Karie Schwalbe, MD  History of Present Illness: 11/09/2022 [name] is here today with a chief complaint of memory loss over the last 3 to 6 months.  Her husband and sister have noticed this.  She previously was very sharp.  Approximately 2 years ago, she had an MRI scan which showed a meningioma.  Due to these concerns, she obtained a repeat MRI scan last week.  This showed increased growth of the lesion.  She presents today for evaluation.  She denies headaches, nausea, or vomiting.  She is aware that she is having some trouble with her memory.  She has been lost at times.  She has had some loss of executive functioning.   Jacqueline Cole has no symptoms of cervical myelopathy.  The symptoms are causing a significant impact on the patient's life.   I have utilized the care everywhere function in epic to review the outside records available from external health systems.  Review of Systems:  A 10 point review of systems is negative, except for the pertinent positives and negatives detailed in the HPI.  Past Medical History: Past Medical History:  Diagnosis Date   Depression    Diabetes mellitus type II    Hyperlipemia    Major depressive disorder, recurrent, in remission (HCC) 09/01/2006   Qualifier: Diagnosis of  By: [name]     Obesity    Osteopenia    PAD (peripheral artery disease) (HCC)    on screening   Syncope 1998   DM diagnosis    Past Surgical History: Past Surgical History:  Procedure Laterality Date   ABDOMINAL HYSTERECTOMY  ~2005   and BSO   BREAST BIOPSY Right 01/05/2019   rt stereo bx 2 areas 1st area ribbon clip   BREAST BIOPSY Right 01/05/2019   rt stereo bx 2nd area coil clip   CATARACT EXTRACTION W/ INTRAOCULAR LENS & ANTERIOR VITRECTOMY Bilateral 01/2022   CHOLECYSTECTOMY  1975   COSMETIC SURGERY     Injury Right  face as a child   ESOPHAGOGASTRODUODENOSCOPY (EGD) WITH PROPOFOL N/A 11/18/2020   Procedure: ESOPHAGOGASTRODUODENOSCOPY (EGD) WITH PROPOFOL;  Surgeon: Wyline Mood, MD;  Location: [facility] ENDOSCOPY;  Service: Gastroenterology;  Laterality: N/A;   VAGINAL DELIVERY     X 2    Allergies: Allergies as of 11/09/2022 - Review Complete 11/09/2022  Allergen Reaction Noted   Onglyza [saxagliptin] Hives and Nausea And Vomiting 11/26/2015   Semaglutide(0.25 or 0.5mg -dos) Nausea And Vomiting and Other (See Comments) 11/02/2021   Liraglutide Nausea Only 07/07/2016    Medications:  Current Outpatient Medications:    ARIPiprazole (ABILIFY) 5 MG tablet, Take 1 tablet (5 mg total) by mouth daily., Disp: 90 tablet, Rfl: 1   atorvastatin (LIPITOR) 20 MG tablet, TAKE ONE TABLET BY MOUTH ONCE WEEKLY ON WEDNESDAY and TAKE ONE TABLET BY MOUTH ONCE WEEKLY ON SUNDAY, Disp: 26 tablet, Rfl: 3   Blood Glucose Monitoring Suppl (ONETOUCH VERIO FLEX SYSTEM) w/Device KIT, Use to check blood sugar 2 times a day., Disp: 1 kit, Rfl: 0   cephALEXin (KEFLEX) 500 MG capsule, Take 1 capsule (500 mg total) by mouth 3 (three) times daily for 7 days., Disp: 21 capsule, Rfl: 0   Continuous Blood Gluc Sensor (FREESTYLE LIBRE 3 SENSOR) MISC, PLACE 1 SENSOR ON THE SKIN EVERY 14 DAYS, Disp: 2 each, Rfl: 11   empagliflozin (JARDIANCE)  25 MG TABS tablet, Take 1 tablet (25 mg total) by mouth daily before breakfast., Disp: 90 tablet, Rfl: 1   gabapentin (NEURONTIN) 300 MG capsule, TAKE TWO CAPSULES BY MOUTH EVERYDAY AT BEDTIME, Disp: 180 capsule, Rfl: 3   glipiZIDE (GLUCOTROL XL) 2.5 MG 24 hr tablet, Take 1 tablet (2.5 mg total) by mouth daily with breakfast., Disp: 90 tablet, Rfl: 3   glucose blood (ONETOUCH VERIO) test strip, Use as instructed to check blood sugar 2 times a day., Disp: 200 each, Rfl: 12   hydrocortisone cream 1 %, Apply 1 Application topically daily as needed for itching., Disp: 30 g, Rfl: 5   insulin glargine (LANTUS) 100  UNIT/ML injection, 35 units in AM, 30 in PM (Patient taking differently: Inject 35 Units into the skin 2 (two) times daily.), Disp: 1 mL, Rfl: 0   Lancets (ONETOUCH ULTRASOFT) lancets, Use as instructed to check blood sugar 2 times a day., Disp: 200 each, Rfl: 12   Propylene Glycol (SYSTANE COMPLETE OP), Place 3 drops into both eyes 3 (three) times daily., Disp: , Rfl:    traZODone (DESYREL) 50 MG tablet, Take 1-2 tablets (50-100 mg total) by mouth at bedtime., Disp: 60 tablet, Rfl: 11   triamcinolone cream (KENALOG) 0.1 %, Apply TO THE affected area(s) TOPICALLY twice daily AS NEEDED, Disp: 45 g, Rfl: 0   trolamine salicylate (ASPERCREME) 10 % cream, Apply 1 Application topically as needed for muscle pain. Apply to feet, Disp: 85 g, Rfl: 5   venlafaxine XR (EFFEXOR-XR) 150 MG 24 hr capsule, TAKE ONE CAPSULE BY MOUTH EVERY MORNING and TAKE ONE CAPSULE BY MOUTH EVERYDAY AT BEDTIME, Disp: 180 capsule, Rfl: 3   vitamin B-12 (CYANOCOBALAMIN) 500 MCG tablet, TAKE ONE TABLET BY MOUTH EVERY MORNING, Disp: 90 tablet, Rfl: 3  Social History: Social History   Tobacco Use   Smoking status: Former    Current packs/day: 0.00    Types: Cigarettes    Quit date: 03/15/1993    Years since quitting: 29.6    Passive exposure: Never   Smokeless tobacco: Never  Vaping Use   Vaping status: Never Used  Substance Use Topics   Alcohol use: No    Alcohol/week: 0.0 standard drinks of alcohol   Drug use: No    Family Medical History: Family History  Problem Relation Age of Onset   Stroke Father        CVA   Heart disease Father        MI   Cancer Sister        breast cancer   Breast cancer Sister 56   Heart disease Brother        MI   Cancer Brother        Lung    Physical Examination: Vitals:   11/09/22 0840  BP: 130/72    General: Patient is in no apparent distress. Attention to examination is appropriate.  Neck:   Supple.  Full range of motion.  Respiratory: Patient is breathing without  any difficulty.   NEUROLOGICAL:     Awake, alert, oriented to person, place, and time.  Requires choices for month and year.  She has some clear memory deficits.  Speech is clear and fluent.   Cranial Nerves: Pupils equal round and reactive to light.  Facial tone is symmetric.  Facial sensation is symmetric. Shoulder shrug is symmetric. Tongue protrusion is midline.  There is no pronator drift.  Strength: Side Biceps Triceps Deltoid Interossei Grip Wrist Ext. Wrist Flex.  R 5 5 5 5 5 5 5   L 5 5 5 5 5 5 5    Side Iliopsoas Quads Hamstring PF DF EHL  R 5 5 5 5 5 5   L 5 5 5 5 5 5    Reflexes are 1+ and symmetric at the biceps, triceps, brachioradialis, patella and achilles.   Hoffman's is absent.   Bilateral upper and lower extremity sensation is intact to light touch.    No evidence of dysmetria noted.  Gait is normal.     Medical Decision Making  Imaging: MRI Brain 11/04/2022 IMPRESSION: 1. 6.0 x 4.5 x 4.1 cm dural-based mass at the right frontal convexity, significantly increased in size from the prior MRI of 11/16/2020 and most consistent with a meningioma. Progressive mass effect upon the underlying brain parenchyma now with mild underlying parenchymal edema. 8 mm leftward midline shift is new from the prior exam, and there is now a degree of leftward subfalcine herniation. Neurosurgical consultation is recommended. 2. Background moderate chronic small vessel ischemic changes within the cerebral white matter and pons, similar to the prior MRI.     Electronically Signed   By: Jackey Loge D.O.   On: 11/04/2022 13:54  I have personally reviewed the images and agree with the above interpretation.  Assessment and Plan: Ms. Georgiou is a pleasant 72 y.o. female with enlarging right sided dural based mass most consistent with meningioma.  This brain tumor has grown substantially in size.  There is now right to left midline shift with compression of her brain.  I discussed with  the patient and her family that this likely represents a meningioma, but has shown substantial growth over the past 2 years.  At this point, this could be causing her memory changes and personality changes.  Due to the increase in size, I recommended surgical intervention for resection of this tumor.  I do not think that she has full capacity to give consent at this point.  I discussed this with her husband present, and asked that the patient give [place] send while her husband provides consent.  They are on the same page and would like to move forward with surgical intervention.  I discussed the planned procedure at length with the patient, including the risks, benefits, alternatives, and indications. The risks discussed include but are not limited to bleeding, infection, need for reoperation, spinal fluid leak, stroke, vision loss, anesthetic complication, coma, paralysis, and even death. I also described in detail that improvement was not guaranteed.  The patient and her husband expressed understanding of these risks, and asked that we proceed with surgery. I described the surgery in layman's terms, and gave ample opportunity for questions, which were answered to the best of my ability.     Thank you for involving me in the care of this patient.      Jax Abdelrahman K. Myer Haff MD, [facility] Neurosurgery

## 2022-11-07 ENCOUNTER — Emergency Department
Admission: EM | Admit: 2022-11-07 | Discharge: 2022-11-07 | Disposition: A | Discharge status: Home / Self Care | Payer: Medicare HMO | Attending: Emergency Medicine | Admitting: Emergency Medicine

## 2022-11-07 ENCOUNTER — Other Ambulatory Visit: Payer: Self-pay

## 2022-11-07 DIAGNOSIS — N390 Urinary tract infection, site not specified: Secondary | ICD-10-CM | POA: Diagnosis not present

## 2022-11-07 DIAGNOSIS — R319 Hematuria, unspecified: Secondary | ICD-10-CM | POA: Diagnosis present

## 2022-11-07 LAB — URINALYSIS, ROUTINE W REFLEX MICROSCOPIC
Bacteria, UA: NONE SEEN
Bilirubin Urine: NEGATIVE
Glucose, UA: >=500 mg/dL — AB
Ketones, ur: NEGATIVE mg/dL
Nitrite: NEGATIVE
Protein, ur: NEGATIVE mg/dL
RBC / HPF: >50 RBC/hpf (ref 0–5)
Specific Gravity, Urine: 1.025 (ref 1.005–1.030)
pH: 6 (ref 5.0–8.0)

## 2022-11-07 MED ORDER — CEPHALEXIN 500 MG PO CAPS
500.0000 mg | ORAL_CAPSULE | Freq: Once | ORAL | Status: AC
  Administered 2022-11-07: 500 mg via ORAL
  Filled 2022-11-07: qty 1

## 2022-11-07 MED ORDER — CEPHALEXIN 500 MG PO CAPS: 500.0000 mg | ORAL_CAPSULE | Freq: Three times a day (TID) | ORAL | 0 refills | Status: DC

## 2022-11-07 NOTE — ED Notes (Signed)
Pt A&O x4, no obvious distress noted, respirations regular/unlabored. Pt verbalizes understanding of discharge instructions. Pt able to ambulate from ED independently.   

## 2022-11-07 NOTE — ED Provider Notes (Signed)
[facility] Provider Note    Event Date/Time   First MD Initiated Contact with Patient 11/07/22 1613     (approximate)   History   Hematuria   HPI  Jacqueline Cole is a 72 y.o. female who presents with complaints of hematuria.  Patient reports this morning when she urinated she noticed blood in her urine.  She describes mild discomfort with urination.  No fevers or chills.  No flank pain.  Not on blood thinners.  No trauma.  Has a history of a hysterectomy     Physical Exam   Triage Vital Signs: ED Triage Vitals [11/07/22 1556]  Encounter Vitals Group     BP (!) 163/81     Systolic BP Percentile      Diastolic BP Percentile      Pulse Rate 89     Resp 20     Temp 98.1 F (36.7 C)     Temp Source Oral     SpO2 97 %     Weight      Height      Head Circumference      Peak Flow      Pain Score 8     Pain Loc      Pain Education      Exclude from Growth Chart     Most recent vital signs: Vitals:   11/07/22 1556  BP: (!) 163/81  Pulse: 89  Resp: 20  Temp: 98.1 F (36.7 C)  SpO2: 97%     General: Awake, no distress.  CV:  Good peripheral perfusion.  Resp:  Normal effort.  Abd:  No distention.  Soft, nontender, no CVA tenderness Other:     ED Results / Procedures / Treatments   Labs (all labs ordered are listed, but only abnormal results are displayed) Labs Reviewed  URINALYSIS, ROUTINE W REFLEX MICROSCOPIC - Abnormal; Notable for the following components:      Result Value   Color, Urine YELLOW (*)    APPearance HAZY (*)    Glucose, UA >=500 (*)    Hgb urine dipstick LARGE (*)    Leukocytes,Ua TRACE (*)    All other components within normal limits  URINE CULTURE     EKG     RADIOLOGY     PROCEDURES:  Critical Care performed:   Procedures   MEDICATIONS ORDERED IN ED: Medications  cephALEXin (KEFLEX) capsule 500 mg (500 mg Oral Given 11/07/22 1640)     IMPRESSION / MDM / ASSESSMENT AND PLAN / ED  COURSE  I reviewed the triage vital signs and the nursing notes. Patient's presentation is most consistent with acute complicated illness / injury requiring diagnostic workup.  Patient presents with hematuria as detailed above, differential includes UTI, bladder mass, kidney stone  Overall well-appearing and in no acute distress, no flank pain to suggest kidney stone, minimal dysuria noted, urinalysis suggestive of possible UTI  Will treat with antibiotics, culture sent, close follow-up with urology, referral placed.        FINAL CLINICAL IMPRESSION(S) / ED DIAGNOSES   Final diagnoses:  Lower urinary tract infectious disease     Rx / DC Orders   ED Discharge Orders          Ordered    Ambulatory referral to Urology        11/07/22 1635    cephALEXin (KEFLEX) 500 MG capsule  3 times daily        11/07/22 1635  Note:  This document was prepared using Dragon voice recognition software and may include unintentional dictation errors.   [name], MD 11/07/22 [phone]

## 2022-11-07 NOTE — ED Triage Notes (Signed)
Patient states blood in urine and low back pain since this morning.

## 2022-11-09 ENCOUNTER — Ambulatory Visit: Payer: Medicare HMO | Admitting: Neurosurgery

## 2022-11-09 ENCOUNTER — Encounter: Payer: Self-pay | Admitting: Neurosurgery

## 2022-11-09 ENCOUNTER — Other Ambulatory Visit: Payer: Self-pay

## 2022-11-09 VITALS — BP 130/72 | Ht 65.0 in | Wt 227.0 lb

## 2022-11-09 DIAGNOSIS — D496 Neoplasm of unspecified behavior of brain: Secondary | ICD-10-CM | POA: Diagnosis not present

## 2022-11-09 DIAGNOSIS — Z01818 Encounter for other preprocedural examination: Secondary | ICD-10-CM

## 2022-11-09 LAB — URINE CULTURE: Culture: 10000 — AB

## 2022-11-09 NOTE — Patient Instructions (Addendum)
Please see below for information in regards to your upcoming surgery:   Planned surgery: right frontal craniotomy for tumor resection   Surgery date: 12/01/22 at [facility] (Medical Mall: [address] [place], [place] [address]) - you will find out your arrival time the business day before your surgery.   Pre-op appointment at [facility] Pre-admit Testing: we will call you with a date/time for this. If you are scheduled for an in person appointment, Pre-admit Testing is located on the first floor of the [facility] building, [address], [address]. Please bring all prescriptions in the original prescription bottles to your appointment. During this appointment, they will advise you which medications you can take the morning of surgery, and which medications you will need to hold for surgery. Labs (such as blood work, EKG) may be done at your pre-op appointment. You are not required to fast for these labs. Should you need to change your pre-op appointment, please call Pre-admit testing at [phone].       Diabetes medications:  Empagliflozin (Jardiance): hold for 3 days prior to surgery    Surgical clearance: we will send a clearance form to Dr Alphonsus Sias      How to contact us:  If you have any questions/concerns before or after surgery, you can reach [place] at [phone], or you can send a mychart message. We can be reached by phone or mychart 8am-4pm, Monday-Friday.  *Please note: Calls after 4pm are forwarded to a third party answering service. Mychart messages are not routinely monitored during evenings, weekends, and holidays. Please call our office to contact the answering service for urgent concerns during non-business hours.     Appointments/FMLA & disability paperwork: Patty & Cristin  Nurse: Royston Cowper  Medical assistants: Laurann [place], & Lyla Son Physician Assistants: Manning Charity & Drake Leach Surgeons: Venetia Night, MD &  Ernestine Mcmurray, MD

## 2022-11-09 NOTE — Addendum Note (Signed)
Addended by: Sharlot Gowda on: 11/09/2022 10:09 AM   Modules accepted: Orders

## 2022-11-10 ENCOUNTER — Telehealth: Payer: Self-pay

## 2022-11-10 NOTE — Progress Notes (Signed)
ED Antimicrobial Stewardship Positive Culture Follow Up   Jacqueline Cole is an 72 y.o. female who presented to [facility] on 11/07/2022 with a chief complaint of  Chief Complaint  Patient presents with   Hematuria   Recent Results (from the past 720 hour(s))  Urine Culture     Status: Abnormal   Collection Time: 11/07/22  4:00 PM   Specimen: Urine, Clean Catch  Result Value Ref Range Status   Specimen Description   Final    URINE, CLEAN CATCH Performed at [facility], [address] [place], [place] [address]    Special Requests   Final    NONE Performed at [facility], [address] [place], [place] [address]    Culture 10,000 COLONIES/mL STAPHYLOCOCCUS HAEMOLYTICUS (A)  Final   Report Status 11/09/2022 FINAL  Final   Organism ID, Bacteria STAPHYLOCOCCUS HAEMOLYTICUS (A)  Final      Susceptibility   Staphylococcus haemolyticus - MIC*    CIPROFLOXACIN <=0.5 SENSITIVE Sensitive     GENTAMICIN <=0.5 SENSITIVE Sensitive     NITROFURANTOIN <=16 SENSITIVE Sensitive     OXACILLIN >=4 RESISTANT Resistant     TETRACYCLINE >=16 RESISTANT Resistant     VANCOMYCIN 1 SENSITIVE Sensitive     TRIMETH/SULFA <=10 SENSITIVE Sensitive     CLINDAMYCIN RESISTANT Resistant     RIFAMPIN <=0.5 SENSITIVE Sensitive     Inducible Clindamycin POSITIVE Resistant     * 10,000 COLONIES/mL STAPHYLOCOCCUS HAEMOLYTICUS   [x]  Treated with cephalexin, organism resistant to prescribed antimicrobial []  Patient discharged originally without antimicrobial agent and treatment is now indicated  New antibiotic prescription: Bactrim 800mg -160mg  (Take 1 tablet by mouth twice daily for 3 days)  Called and informed patient of new antibiotic prescription on 11/10/22 and instructed patient to stop taking Keflex. Patient is scheduled to follow-up with Urology on 11/19/22.  ED Provider: Claudell Kyle, MD  Thank you for involving pharmacy in this patient's care.   Rockwell Alexandria, PharmD Clinical  Pharmacist 11/10/2022 1:30 PM

## 2022-11-10 NOTE — Telephone Encounter (Signed)
-----   Message from [place] sent at 11/10/2022  1:17 PM EDT ----- Is there a way to change her pharmacy to [facility] on [address] and take out [facility]?

## 2022-11-10 NOTE — Telephone Encounter (Signed)
Done

## 2022-11-12 ENCOUNTER — Telehealth: Payer: Self-pay | Admitting: Internal Medicine

## 2022-11-12 DIAGNOSIS — E1142 Type 2 diabetes mellitus with diabetic polyneuropathy: Secondary | ICD-10-CM

## 2022-11-12 MED ORDER — INSULIN GLARGINE 100 UNIT/ML ~~LOC~~ SOLN
35.0000 [IU] | Freq: Two times a day (BID) | SUBCUTANEOUS | 12 refills | Status: DC
Start: 2022-11-12 — End: 2023-10-25

## 2022-11-12 NOTE — Telephone Encounter (Signed)
Pt is scheduled with Dr Vanetta Shawl [facility]) on 12/16/2022  Nothing further needed.

## 2022-11-12 NOTE — Telephone Encounter (Signed)
Rx sent electronically.  

## 2022-11-12 NOTE — Telephone Encounter (Signed)
Prescription Request  11/12/2022  LOV: 10/25/2022  What is the name of the medication or equipment?  insulin glargine (LANTUS) 100 UNIT/ML injection  Have you contacted your pharmacy to request a refill? No   Which pharmacy would you like this sent to?  [facility] [address] [place] - [address] [place] [address] Phone: [phone] Fax: [phone]    Patient notified that their request is being sent to the clinical staff for review and that they should receive a response within 2 business days.   Please advise at Mobile [phone] (mobile)

## 2022-11-19 ENCOUNTER — Other Ambulatory Visit: Payer: Self-pay

## 2022-11-19 ENCOUNTER — Ambulatory Visit: Payer: Medicare HMO | Admitting: Urology

## 2022-11-19 ENCOUNTER — Encounter
Admission: RE | Admit: 2022-11-19 | Discharge: 2022-11-19 | Disposition: A | Discharge status: Home / Self Care | Payer: Medicare HMO | Source: Ambulatory Visit | Attending: Neurosurgery | Admitting: Neurosurgery

## 2022-11-19 ENCOUNTER — Encounter: Payer: Self-pay | Admitting: Urology

## 2022-11-19 ENCOUNTER — Other Ambulatory Visit
Admission: RE | Admit: 2022-11-19 | Discharge: 2022-11-19 | Disposition: A | Discharge status: Home / Self Care | Payer: Medicare HMO | Source: Home / Self Care | Attending: Urology | Admitting: Urology

## 2022-11-19 VITALS — BP 126/75 | HR 87 | Ht 65.0 in | Wt 234.0 lb

## 2022-11-19 VITALS — HR 78 | Resp 18

## 2022-11-19 DIAGNOSIS — N390 Urinary tract infection, site not specified: Secondary | ICD-10-CM | POA: Diagnosis not present

## 2022-11-19 DIAGNOSIS — Z01810 Encounter for preprocedural cardiovascular examination: Secondary | ICD-10-CM | POA: Diagnosis not present

## 2022-11-19 DIAGNOSIS — R9431 Abnormal electrocardiogram [ECG] [EKG]: Secondary | ICD-10-CM | POA: Insufficient documentation

## 2022-11-19 DIAGNOSIS — Z01812 Encounter for preprocedural laboratory examination: Secondary | ICD-10-CM

## 2022-11-19 DIAGNOSIS — B3731 Acute candidiasis of vulva and vagina: Secondary | ICD-10-CM | POA: Diagnosis not present

## 2022-11-19 DIAGNOSIS — Z87440 Personal history of urinary (tract) infections: Secondary | ICD-10-CM

## 2022-11-19 DIAGNOSIS — E1142 Type 2 diabetes mellitus with diabetic polyneuropathy: Secondary | ICD-10-CM | POA: Insufficient documentation

## 2022-11-19 DIAGNOSIS — Z01818 Encounter for other preprocedural examination: Secondary | ICD-10-CM | POA: Diagnosis not present

## 2022-11-19 DIAGNOSIS — E1122 Type 2 diabetes mellitus with diabetic chronic kidney disease: Secondary | ICD-10-CM | POA: Diagnosis not present

## 2022-11-19 DIAGNOSIS — N1832 Chronic kidney disease, stage 3b: Secondary | ICD-10-CM | POA: Insufficient documentation

## 2022-11-19 DIAGNOSIS — I739 Peripheral vascular disease, unspecified: Secondary | ICD-10-CM | POA: Insufficient documentation

## 2022-11-19 DIAGNOSIS — N898 Other specified noninflammatory disorders of vagina: Secondary | ICD-10-CM

## 2022-11-19 HISTORY — DX: Chronic kidney disease, unspecified: N18.9

## 2022-11-19 HISTORY — DX: Benign neoplasm of brain, unspecified: D33.2

## 2022-11-19 HISTORY — DX: Headache, unspecified: R51.9

## 2022-11-19 HISTORY — DX: Benign neoplasm of right breast: D24.1

## 2022-11-19 HISTORY — DX: Benign neoplasm of right breast: D24.2

## 2022-11-19 LAB — URINALYSIS, COMPLETE (UACMP) WITH MICROSCOPIC
Bacteria, UA: NONE SEEN
Bilirubin Urine: NEGATIVE
Glucose, UA: >=500 mg/dL — AB
Hgb urine dipstick: NEGATIVE
Ketones, ur: NEGATIVE mg/dL
Leukocytes,Ua: NEGATIVE
Nitrite: NEGATIVE
Protein, ur: NEGATIVE mg/dL
RBC / HPF: NONE SEEN RBC/hpf (ref 0–5)
Specific Gravity, Urine: 1.01 (ref 1.005–1.030)
pH: 5.5 (ref 5.0–8.0)

## 2022-11-19 LAB — CBC
HCT: 40.5 % (ref 36.0–46.0)
Hemoglobin: 13.3 g/dL (ref 12.0–15.0)
MCH: 28.7 pg (ref 26.0–34.0)
MCHC: 32.8 g/dL (ref 30.0–36.0)
MCV: 87.3 fL (ref 80.0–100.0)
Platelets: 216 10*3/uL (ref 150–400)
RBC: 4.64 MIL/uL (ref 3.87–5.11)
RDW: 14.6 % (ref 11.5–15.5)
WBC: 8.3 10*3/uL (ref 4.0–10.5)
nRBC: 0 % (ref 0.0–0.2)

## 2022-11-19 LAB — BASIC METABOLIC PANEL
Anion gap: 10 (ref 5–15)
BUN: 10 mg/dL (ref 8–23)
CO2: 26 mmol/L (ref 22–32)
Calcium: 8.9 mg/dL (ref 8.9–10.3)
Chloride: 103 mmol/L (ref 98–111)
Creatinine, Ser: 1.07 mg/dL — ABNORMAL HIGH (ref 0.44–1.00)
GFR, Estimated: 56 mL/min — ABNORMAL LOW (ref >60)
Glucose, Bld: 148 mg/dL — ABNORMAL HIGH (ref 70–99)
Potassium: 3.7 mmol/L (ref 3.5–5.1)
Sodium: 139 mmol/L (ref 135–145)

## 2022-11-19 LAB — TYPE AND SCREEN
ABO/RH(D): O POS
Antibody Screen: NEGATIVE

## 2022-11-19 LAB — SURGICAL PCR SCREEN
MRSA, PCR: NEGATIVE
Staphylococcus aureus: NEGATIVE

## 2022-11-19 LAB — BLADDER SCAN AMB NON-IMAGING

## 2022-11-19 MED ORDER — FLUCONAZOLE 150 MG PO TABS
ORAL_TABLET | ORAL | 1 refills | Status: DC
Start: 2022-11-19 — End: 2022-12-07

## 2022-11-19 NOTE — Patient Instructions (Addendum)
Your procedure is scheduled on: 12/01/22 - Wednesday Report to the Registration Desk on the 1st floor of the Medical Mall. To find out your arrival time, please call [phone] between 1PM - 3PM on: 11/30/22 - Tuesday If your arrival time is 6:00 am, do not arrive before that time as the Medical Mall entrance doors do not open until 6:00 am.  REMEMBER: Instructions that are not followed completely may result in serious medical risk, up to and including death; or upon the discretion of your surgeon and anesthesiologist your surgery may need to be rescheduled.  Do not eat food after midnight the night before surgery.  No gum chewing or hard candies.  You may drink water up to 2 hours before you are scheduled to arrive for your surgery. Do not drink anything within 2 hours of your scheduled arrival time.   May continue Anti-inflammatories (NSAIDS) such as Advil, Aleve, Ibuprofen, Motrin, Naproxen, Naprosyn and Aspirin based products such as Excedrin, Goody's Powder, BC Powder.  Stop ANY OVER THE COUNTER supplements until after surgery.  You may take Tylenol if needed for pain up until the day of surgery.  Continue taking all prescribed medications with the exception of the following:  empagliflozin (JARDIANCE) hold beginning 11/28/22. insulin glargine (LANTUS) inject 1/2 of your bedtime dose on the night before your surgery, and hold the morning dose on the day of surgery.  TAKE ONLY THESE MEDICATIONS THE MORNING OF SURGERY WITH A SIP OF WATER:  ARIPiprazole (ABILIFY)  venlafaxine XR (EFFEXOR-XR)    No Alcohol for 24 hours before or after surgery.  No Smoking including e-cigarettes for 24 hours before surgery.  No chewable tobacco products for at least 6 hours before surgery.  No nicotine patches on the day of surgery.  Do not use any "recreational" drugs for at least a week (preferably 2 weeks) before your surgery.  Please be advised that the combination of cocaine and  anesthesia may have negative outcomes, up to and including death. If you test positive for cocaine, your surgery will be cancelled.  On the morning of surgery brush your teeth with toothpaste and water, you may rinse your mouth with mouthwash if you wish. Do not swallow any toothpaste or mouthwash.  Do not wear jewelry, make-up, hairpins, clips or nail polish.  Do not wear lotions, powders, or perfumes.   Do not shave body hair from the neck down 48 hours before surgery.  Contact lenses, hearing aids and dentures may not be worn into surgery.  Do not bring valuables to the hospital. [facility] is not responsible for any missing/lost belongings or valuables.   Notify your doctor if there is any change in your medical condition (cold, fever, infection).  Wear comfortable clothing (specific to your surgery type) to the hospital.  After surgery, you can help prevent lung complications by doing breathing exercises.  Take deep breaths and cough every 1-2 hours. Your doctor may order a device called an Incentive Spirometer to help you take deep breaths. When coughing or sneezing, hold a pillow firmly against your incision with both hands. This is called "splinting." Doing this helps protect your incision. It also decreases belly discomfort.  If you are being admitted to the hospital overnight, leave your suitcase in the car. After surgery it may be brought to your room.  In case of increased patient census, it may be necessary for you, the patient, to continue your postoperative care in the Same Day Surgery department.  If you  are being discharged the day of surgery, you will not be allowed to drive home. You will need a responsible individual to drive you home and stay with you for 24 hours after surgery.   If you are taking public transportation, you will need to have a responsible individual with you.  Please call the Pre-admissions Testing Dept. at [phone] if you have any  questions about these instructions.  Surgery Visitation Policy:  Patients having surgery or a procedure may have two visitors.  Children under the age of 62 must have an adult with them who is not the patient.  Inpatient Visitation:    Visiting hours are 7 a.m. to 8 p.m. Up to four visitors are allowed at one time in a patient room. The visitors may rotate out with other people during the day.  One visitor age 2 or older may stay with the patient overnight and must be in the room by 8 p.m.     Preparing for Surgery with CHLORHEXIDINE GLUCONATE (CHG) Soap  Chlorhexidine Gluconate (CHG) Soap  o An antiseptic cleaner that kills germs and bonds with the skin to continue killing germs even after washing  o Used for showering the night before surgery and morning of surgery  Before surgery, you can play an important role by reducing the number of germs on your skin.  CHG (Chlorhexidine gluconate) soap is an antiseptic cleanser which kills germs and bonds with the skin to continue killing germs even after washing.  Please do not use if you have an allergy to CHG or antibacterial soaps. If your skin becomes reddened/irritated stop using the CHG.  1. Shower the NIGHT BEFORE SURGERY and the MORNING OF SURGERY with CHG soap.  2. If you choose to wash your hair, wash your hair first as usual with your normal shampoo.  3. After shampooing, rinse your hair and body thoroughly to remove the shampoo.  4. Use CHG as you would any other liquid soap. You can apply CHG directly to the skin and wash gently with a scrungie or a clean washcloth.  5. Apply the CHG soap to your body only from the neck down. Do not use on open wounds or open sores. Avoid contact with your eyes, ears, mouth, and genitals (private parts). Wash face and genitals (private parts) with your normal soap.  6. Wash thoroughly, paying special attention to the area where your surgery will be performed.  7. Thoroughly rinse your  body with warm water.  8. Do not shower/wash with your normal soap after using and rinsing off the CHG soap.  9. Pat yourself dry with a clean towel.  10. Wear clean pajamas to bed the night before surgery.  12. Place clean sheets on your bed the night of your first shower and do not sleep with pets.  13. Shower again with the CHG soap on the day of surgery prior to arriving at the hospital.  14. Do not apply any deodorants/lotions/powders.  15. Please wear clean clothes to the hospital.

## 2022-11-19 NOTE — Progress Notes (Signed)
Jacqueline Cole,acting as a scribe for Jacqueline Scotland, MD.,have documented all relevant documentation on the behalf of Jacqueline Scotland, MD,as directed by  Jacqueline Scotland, MD while in the presence of Jacqueline Scotland, MD.  11/19/2022 2:40 PM   Jacqueline Cole Jacqueline Cole [birth year] [id number]  Referring provider: [name], MD [address] [place],  [place] [address]  Chief Complaint  Patient presents with   Urinary Tract Infection    HPI: 72 year-old female who presents today for further evaluation of a urinary tract infection.   She was in the emergency room on 11/07/2022 after she noticed some blood in her urine. Her urinalysis was grossly positive. She was prescribed Keflex 3 times a day. She ended up growing staphylococcus haemolyticus, only 10,000 colonies.  Her PVR is 25 mL. We were able to catheterize for a urine specimen.  She reports a recent episode of heavy vaginal bleeding, which she initially mistook for a urinary issue. She has a history of a complete hysterectomy and was concerned about the source of the bleeding. The bleeding has since resolved after completing the antibiotic course, but she did notice large clots during the episode.    She has a personal history of some confusion related to her brain tumor, but her husband does validate that he thinks the bleeding was vaginal rather than from her urine.  He also witnessed large clots in the toilet.  She has also been recently diagnosed with a large 6 cm meningioma for which she is scheduled to undergo surgery in the near future with Dr. Myer Haff.   Results for orders placed or performed during the hospital encounter of 11/19/22  Urinalysis, Complete w Microscopic -  Result Value Ref Range   Color, Urine YELLOW YELLOW   APPearance CLEAR CLEAR   Specific Gravity, Urine 1.010 1.005 - 1.030   pH 5.5 5.0 - 8.0   Glucose, UA >=500 (A) NEGATIVE mg/dL   Hgb urine dipstick NEGATIVE NEGATIVE   Bilirubin Urine NEGATIVE  NEGATIVE   Ketones, ur NEGATIVE NEGATIVE mg/dL   Protein, ur NEGATIVE NEGATIVE mg/dL   Nitrite NEGATIVE NEGATIVE   Leukocytes,Ua NEGATIVE NEGATIVE   Squamous Epithelial / HPF 0-5 0 - 5 /HPF   WBC, UA 0-5 0 - 5 WBC/hpf   RBC / HPF NONE SEEN 0 - 5 RBC/hpf   Bacteria, UA NONE SEEN NONE SEEN  Results for orders placed or performed in visit on 11/19/22  Bladder Scan (Post Void Residual) in office  Result Value Ref Range   Scan Result 25ml   Results for orders placed or performed during the hospital encounter of 11/19/22  Surgical pcr screen   Specimen: Nasal Mucosa; Nasal Swab  Result Value Ref Range   MRSA, PCR NEGATIVE NEGATIVE   Staphylococcus aureus NEGATIVE NEGATIVE  CBC  Result Value Ref Range   WBC 8.3 4.0 - 10.5 K/uL   RBC 4.64 3.87 - 5.11 MIL/uL   Hemoglobin 13.3 12.0 - 15.0 g/dL   HCT 19.1 47.8 - 29.5 %   MCV 87.3 80.0 - 100.0 fL   MCH 28.7 26.0 - 34.0 pg   MCHC 32.8 30.0 - 36.0 g/dL   RDW 62.1 30.8 - 65.7 %   Platelets 216 150 - 400 K/uL   nRBC 0.0 0.0 - 0.2 %  Basic metabolic panel  Result Value Ref Range   Sodium 139 135 - 145 mmol/L   Potassium 3.7 3.5 - 5.1 mmol/L   Chloride 103 98 - 111 mmol/L  CO2 26 22 - 32 mmol/L   Glucose, Bld 148 (H) 70 - 99 mg/dL   BUN 10 8 - 23 mg/dL   Creatinine, Ser 6.29 (H) 0.44 - 1.00 mg/dL   Calcium 8.9 8.9 - 52.8 mg/dL   GFR, Estimated 56 (L) >60 mL/min   Anion gap 10 5 - 15  Type and screen [facility]  Result Value Ref Range   ABO/RH(D) PENDING    Antibody Screen PENDING    Sample Expiration 12/03/2022,2359    Extend sample reason      NO TRANSFUSIONS OR PREGNANCY IN THE PAST 3 MONTHS Performed at [facility], [address], [place], [place] [address]        PMH: Past Medical History:  Diagnosis Date   Brain tumor (benign) (HCC)    Chronic kidney disease    stage 3   Depression    Diabetes mellitus type II    Headache    brain tumor   Hyperlipemia    Major depressive  disorder, recurrent, in remission (HCC) 09/01/2006   Qualifier: Diagnosis of  By: Jacqueline Cole     Multiple fibroadenomata of both breasts    Obesity    Osteopenia    PAD (peripheral artery disease) (HCC)    on screening   Syncope 1998   DM diagnosis    Surgical History: Past Surgical History:  Procedure Laterality Date   ABDOMINAL HYSTERECTOMY  ~2005   and BSO   BREAST BIOPSY Right 01/05/2019   rt stereo bx 2 areas 1st area ribbon clip   BREAST BIOPSY Right 01/05/2019   rt stereo bx 2nd area coil clip   CATARACT EXTRACTION W/ INTRAOCULAR LENS & ANTERIOR VITRECTOMY Bilateral 01/2022   CHOLECYSTECTOMY  1975   COSMETIC SURGERY     Injury Right face as a child   ESOPHAGOGASTRODUODENOSCOPY (EGD) WITH PROPOFOL N/A 11/18/2020   Procedure: ESOPHAGOGASTRODUODENOSCOPY (EGD) WITH PROPOFOL;  Surgeon: Wyline Mood, MD;  Location: [facility] ENDOSCOPY;  Service: Gastroenterology;  Laterality: N/A;   VAGINAL DELIVERY     X 2    Home Medications:  Allergies as of 11/19/2022       Reactions   Onglyza [saxagliptin] Hives, Nausea And Vomiting   Semaglutide(0.25 Or 0.5mg -dos) Nausea And Vomiting, Other (See Comments)   Extreme dehydration   Liraglutide Nausea Only   Can't eat, nausea, higher sugars   Tape Other (See Comments)   Thin skin         Medication List        Accurate as of November 19, 2022  2:40 PM. If you have any questions, ask your nurse or doctor.          STOP taking these medications    vitamin B-12 500 MCG tablet Commonly known as: CYANOCOBALAMIN Stopped by: Jacqueline Cole       TAKE these medications    ADVIL PM PO Take 1 tablet by mouth at bedtime as needed.   atorvastatin 20 MG tablet Commonly known as: LIPITOR TAKE ONE TABLET BY MOUTH ONCE WEEKLY ON WEDNESDAY and TAKE ONE TABLET BY MOUTH ONCE WEEKLY ON SUNDAY   empagliflozin 25 MG Tabs tablet Commonly known as: JARDIANCE Take 1 tablet (25 mg total) by mouth daily before breakfast.    fluconazole 150 MG tablet Commonly known as: DIFLUCAN 1 tablet once, repeat once as needed Started by: Jacqueline Cole   FLUoxetine 20 MG capsule Commonly known as: PROZAC Take 20 mg by mouth daily.   FreeStyle [employer]  3 Sensor Misc PLACE 1 SENSOR ON THE SKIN Cole 14 DAYS   gabapentin 300 MG capsule Commonly known as: NEURONTIN TAKE TWO CAPSULES BY MOUTH EVERYDAY AT BEDTIME   glipiZIDE 2.5 MG 24 hr tablet Commonly known as: GLUCOTROL XL Take 1 tablet (2.5 mg total) by mouth daily with breakfast.   hydrocortisone cream 1 % Apply 1 Application topically daily as needed for itching.   ibuprofen 400 MG tablet Commonly known as: ADVIL Take 400 mg by mouth Cole 6 (six) hours as needed.   insulin glargine 100 UNIT/ML injection Commonly known as: Lantus Inject 0.35 mLs (35 Units total) into the skin 2 (two) times daily. What changed: additional instructions   onetouch ultrasoft lancets Use as instructed to check blood sugar 2 times a day.   OneTouch Verio Flex System w/Device Kit Use to check blood sugar 2 times a day.   OneTouch Verio test strip Generic drug: glucose blood Use as instructed to check blood sugar 2 times a day.   triamcinolone cream 0.1 % Commonly known as: KENALOG Apply TO THE affected area(s) TOPICALLY twice daily AS NEEDED   trolamine salicylate 10 % cream Commonly known as: ASPERCREME Apply 1 Application topically as needed for muscle pain. Apply to feet   venlafaxine XR 150 MG 24 hr capsule Commonly known as: EFFEXOR-XR TAKE ONE CAPSULE BY MOUTH Cole MORNING and TAKE ONE CAPSULE BY MOUTH EVERYDAY AT BEDTIME        Allergies:  Allergies  Allergen Reactions   Onglyza [Saxagliptin] Hives and Nausea And Vomiting   Semaglutide(0.25 Or 0.5mg -Dos) Nausea And Vomiting and Other (See Comments)    Extreme dehydration   Liraglutide Nausea Only    Can't eat, nausea, higher sugars   Tape Other (See Comments)    Thin skin     Family  History: Family History  Problem Relation Age of Onset   Stroke Father        CVA   Heart disease Father        MI   Cancer Sister        breast cancer   Breast cancer Sister 33   Heart disease Brother        MI   Cancer Brother        Lung    Social History:  reports that she quit smoking about 29 years ago. Her smoking use included cigarettes. She has never been exposed to tobacco smoke. She has never used smokeless tobacco. She reports that she does not drink alcohol and does not use drugs.   Physical Exam: BP 126/75 (BP Location: Left Arm, Patient Position: Sitting, Cuff Size: Large)   Pulse 87   Ht 5\' 5"  (1.651 m)   Wt 234 lb (106.1 kg)   BMI 38.94 kg/m   Constitutional:  Alert and oriented, No acute distress. Marland Kitchen HEENT: [place] AT, moist mucus membranes.  Trachea midline, no masses. Pelvic: Whitish adherent material on labia consistent with probable candida, erythema present. Urethra normal. Vaginal vault with rugae. Granulomatous mass at the anterior apex of the vagina, friable in appearance ~ 2 cm. Question on if she had a cervix or not but the location where one would expect, there was a fungating mass that had a friable appearance.  Pelvic exam chaperoned by CMA [place]. Neurologic: Grossly intact, no focal deficits, moving all 4 extremities. Psychiatric: Normal mood and affect.   Assessment & Plan:   1. Vaginal mass - Referral to OBGYN -This is likely the source  of the vaginal bleeding  2. Possible hematuria - Source of bleeding is likely vaginal in nature, given the finding of this mass, as well as the nature of her bleeding, large clots in the toilet - Cathed her for a urine today to rule out ongoing infection  3. Microscopic hematuria -Patient was catheterized today for specimen; did void spontaneously into a specimen cup however ended up spilling with cup and became confused.  Catheterized specimen shows no blood, will defer hematuria workup at this time  given the above findings.  Suspect previous sample likely contaminant.  1. Vaginal/ labial yeast infection - Diflucan 150 mg, refill x1, Repeat as needed  Return if symptoms worsen or fail to improve.  I have reviewed the above documentation for accuracy and completeness, and I agree with the above.   Jacqueline Scotland, MD    [facility] [address], [address] [place], [place] [address] [phone]

## 2022-11-19 NOTE — Progress Notes (Unsigned)
In and Out Catheterization  Patient is present today for a I & O catheterization for sterile specimen collection. Patient was cleaned and prepped in a sterile fashion with betadine. A 14FR cath was inserted no complications were noted, 60ml of urine return was noted, urine was yellow in color. A clean urine sample was collected for urinalysis. Bladder was drained and catheter was removed with out difficulty.    Performed by: Debbe Bales, CMA   Follow up/ Additional notes: See provider note from today.

## 2022-11-22 ENCOUNTER — Telehealth: Payer: Self-pay | Admitting: Obstetrics and Gynecology

## 2022-11-22 NOTE — Telephone Encounter (Signed)
Thr patient spouse Kamara Primo  on DPR returning call to scheduled for referral appointment. He reports the patient will be having a tumor removed from her brain on September 18 th. Please advise?

## 2022-11-22 NOTE — Addendum Note (Signed)
Addended by: Vanna Scotland on: 11/22/2022 09:22 AM   Modules accepted: Orders

## 2022-11-24 ENCOUNTER — Other Ambulatory Visit: Payer: Self-pay | Admitting: Nurse Practitioner

## 2022-11-24 ENCOUNTER — Inpatient Hospital Stay: Payer: Medicare HMO | Attending: Obstetrics and Gynecology | Admitting: Obstetrics and Gynecology

## 2022-11-24 VITALS — BP 151/64 | HR 74 | Temp 98.6°F | Resp 19 | Wt 233.4 lb

## 2022-11-24 DIAGNOSIS — E785 Hyperlipidemia, unspecified: Secondary | ICD-10-CM | POA: Diagnosis not present

## 2022-11-24 DIAGNOSIS — N841 Polyp of cervix uteri: Secondary | ICD-10-CM | POA: Diagnosis not present

## 2022-11-24 DIAGNOSIS — E1151 Type 2 diabetes mellitus with diabetic peripheral angiopathy without gangrene: Secondary | ICD-10-CM | POA: Insufficient documentation

## 2022-11-24 DIAGNOSIS — M8580 Other specified disorders of bone density and structure, unspecified site: Secondary | ICD-10-CM | POA: Diagnosis not present

## 2022-11-24 DIAGNOSIS — E1122 Type 2 diabetes mellitus with diabetic chronic kidney disease: Secondary | ICD-10-CM | POA: Insufficient documentation

## 2022-11-24 DIAGNOSIS — N950 Postmenopausal bleeding: Secondary | ICD-10-CM | POA: Diagnosis not present

## 2022-11-24 DIAGNOSIS — D398 Neoplasm of uncertain behavior of other specified female genital organs: Secondary | ICD-10-CM | POA: Diagnosis not present

## 2022-11-24 DIAGNOSIS — D332 Benign neoplasm of brain, unspecified: Secondary | ICD-10-CM | POA: Diagnosis not present

## 2022-11-24 DIAGNOSIS — N842 Polyp of vagina: Secondary | ICD-10-CM | POA: Diagnosis not present

## 2022-11-24 DIAGNOSIS — Z79899 Other long term (current) drug therapy: Secondary | ICD-10-CM | POA: Diagnosis not present

## 2022-11-24 DIAGNOSIS — E669 Obesity, unspecified: Secondary | ICD-10-CM | POA: Diagnosis not present

## 2022-11-24 DIAGNOSIS — F325 Major depressive disorder, single episode, in full remission: Secondary | ICD-10-CM | POA: Diagnosis not present

## 2022-11-24 DIAGNOSIS — Z87891 Personal history of nicotine dependence: Secondary | ICD-10-CM | POA: Insufficient documentation

## 2022-11-24 DIAGNOSIS — Z7984 Long term (current) use of oral hypoglycemic drugs: Secondary | ICD-10-CM | POA: Diagnosis not present

## 2022-11-24 DIAGNOSIS — Z794 Long term (current) use of insulin: Secondary | ICD-10-CM | POA: Diagnosis not present

## 2022-11-24 DIAGNOSIS — N898 Other specified noninflammatory disorders of vagina: Secondary | ICD-10-CM | POA: Insufficient documentation

## 2022-11-24 DIAGNOSIS — N1832 Chronic kidney disease, stage 3b: Secondary | ICD-10-CM | POA: Diagnosis not present

## 2022-11-24 DIAGNOSIS — Z90710 Acquired absence of both cervix and uterus: Secondary | ICD-10-CM | POA: Insufficient documentation

## 2022-11-24 NOTE — Progress Notes (Signed)
Gynecologic Oncology Consult Visit   Referring Provider: Dr Apolinar Junes  Chief Concern: vaginal bleeding and mass.  Subjective:  Jacqueline Cole is a 72 y.o. P2 female who is seen in consultation from Dr. Apolinar Junes for vaginal bleeding and mass.    She was in the emergency room on 11/07/2022 after she noticed some blood in her urine for the first time. Her urinalysis was grossly positive. She was prescribed Keflex 3 times a day. She ended up growing staphylococcus haemolyticus, only 10,000 colonies. PVR is 25 mL.   She reports a recent episode of heavy vaginal bleeding, which she initially mistook for a urinary issue. She has a history of a complete hysterectomy in her 30s for bleeding with Dr Barnabas Lister. The bleeding has since resolved after completing the antibiotic course, but she did notice large clots during the episode.  Saw Dr. Apolinar Junes 11/19/22 and vaginal mass noted.  Granulomatous mass at the anterior apex of the vagina, friable in appearance ~ 2 cm. Question on if she had a cervix or not but the location where one would expect, there was a fungating mass that had a friable appearance.    She has a personal history of some confusion related to a brain tumor.  She has also been recently diagnosed with a large 6 cm meningioma for which she is scheduled to undergo surgery in the next week with Dr. Myer Haff.  Problem List: Patient Active Problem List   Diagnosis Date Noted   Postmenopausal bleeding 11/24/2022   Vaginal mass 11/24/2022   Hearing loss secondary to cerumen impaction, bilateral 09/22/2022   Constipation 06/30/2022   PAD (peripheral artery disease) (HCC) 05/26/2022   Meningioma (HCC) 07/10/2021   Edema 12/10/2020   Stage 3b chronic kidney disease (HCC) 11/05/2020   Memory loss 09/09/2020   Multiple fibroadenomata of both breasts 04/24/2019   MDD (major depressive disorder), recurrent episode, moderate (HCC) 08/09/2017   Osteopenia    Osteoarthritis of right knee 12/13/2016    Advance directive discussed with patient 12/13/2016   Type 2 diabetes, controlled, with peripheral neuropathy (HCC) 02/15/2012   Routine general medical examination at a health care facility 05/03/2011   Hyperlipemia 09/01/2006    Past Medical History: Past Medical History:  Diagnosis Date   Brain tumor (benign) (HCC)    Chronic kidney disease    stage 3   Depression    Diabetes mellitus type II    Headache    brain tumor   Hyperlipemia    Major depressive disorder, recurrent, in remission (HCC) 09/01/2006   Qualifier: Diagnosis of  By: [name]     Multiple fibroadenomata of both breasts    Obesity    Osteopenia    PAD (peripheral artery disease) (HCC)    on screening   Syncope 1998   DM diagnosis    Past Surgical History: Past Surgical History:  Procedure Laterality Date   ABDOMINAL HYSTERECTOMY  ~2005   and BSO   BREAST BIOPSY Right 01/05/2019   rt stereo bx 2 areas 1st area ribbon clip   BREAST BIOPSY Right 01/05/2019   rt stereo bx 2nd area coil clip   CATARACT EXTRACTION W/ INTRAOCULAR LENS & ANTERIOR VITRECTOMY Bilateral 01/2022   CHOLECYSTECTOMY  1975   COSMETIC SURGERY     Injury Right face as a child   ESOPHAGOGASTRODUODENOSCOPY (EGD) WITH PROPOFOL N/A 11/18/2020   Procedure: ESOPHAGOGASTRODUODENOSCOPY (EGD) WITH PROPOFOL;  Surgeon: Wyline Mood, MD;  Location: [facility] ENDOSCOPY;  Service: Gastroenterology;  Laterality: N/A;  VAGINAL DELIVERY     X 2    Family History: Family History  Problem Relation Age of Onset   Stroke Father        CVA   Heart disease Father        MI   Cancer Sister        breast cancer   Breast cancer Sister 94   Heart disease Brother        MI   Cancer Brother        Lung    Social History: Social History   Socioeconomic History   Marital status: Married    Spouse name: Not on file   Number of children: 2   Years of education: Not on file   Highest education level: Not on file  Occupational History     Comment: "Just up and quit" at [employer]  Tobacco Use   Smoking status: Former    Current packs/day: 0.00    Types: Cigarettes    Quit date: 03/15/1993    Years since quitting: 29.7    Passive exposure: Never   Smokeless tobacco: Never  Vaping Use   Vaping status: Never Used  Substance and Sexual Activity   Alcohol use: No    Alcohol/week: 0.0 standard drinks of alcohol   Drug use: No   Sexual activity: Not on file  Other Topics Concern   Not on file  Social History Narrative   Has living will   Husband, then daughter Arlana Hove, have health care POA   Would reluctantly accept resuscitation but no life prolonging measures   Social Determinants of Health   Financial Resource Strain: Low Risk  (04/03/2020)   Overall Financial Resource Strain (CARDIA)    Difficulty of Paying Living Expenses: Not very hard  Food Insecurity: Not on file  Transportation Needs: No Transportation Needs (02/26/2021)   PRAPARE - Administrator, Civil Service (Medical): No    Lack of Transportation (Non-Medical): No  Physical Activity: Not on file  Stress: Not on file  Social Connections: Not on file  Intimate Partner Violence: Not on file    Allergies: Allergies  Allergen Reactions   Onglyza [Saxagliptin] Hives and Nausea And Vomiting   Semaglutide(0.25 Or 0.5mg -Dos) Nausea And Vomiting and Other (See Comments)    Extreme dehydration   Liraglutide Nausea Only    Can't eat, nausea, higher sugars   Tape Other (See Comments)    Thin skin     Current Medications: Current Outpatient Medications  Medication Sig Dispense Refill   atorvastatin (LIPITOR) 20 MG tablet TAKE ONE TABLET BY MOUTH ONCE WEEKLY ON WEDNESDAY and TAKE ONE TABLET BY MOUTH ONCE WEEKLY ON SUNDAY 26 tablet 3   Blood Glucose Monitoring Suppl (ONETOUCH VERIO FLEX SYSTEM) w/Device KIT Use to check blood sugar 2 times a day. 1 kit 0   Continuous Blood Gluc Sensor (FREESTYLE [employer] 3 SENSOR) MISC PLACE 1 SENSOR ON THE SKIN EVERY  14 DAYS 2 each 11   empagliflozin (JARDIANCE) 25 MG TABS tablet Take 1 tablet (25 mg total) by mouth daily before breakfast. 90 tablet 1   fluconazole (DIFLUCAN) 150 MG tablet 1 tablet once, repeat once as needed 1 tablet 1   FLUoxetine (PROZAC) 20 MG capsule Take 20 mg by mouth daily.     gabapentin (NEURONTIN) 300 MG capsule TAKE TWO CAPSULES BY MOUTH EVERYDAY AT BEDTIME 180 capsule 3   glipiZIDE (GLUCOTROL XL) 2.5 MG 24 hr tablet Take 1 tablet (2.5 mg  total) by mouth daily with breakfast. 90 tablet 3   glucose blood (ONETOUCH VERIO) test strip Use as instructed to check blood sugar 2 times a day. 200 each 12   hydrocortisone cream 1 % Apply 1 Application topically daily as needed for itching. 30 g 5   ibuprofen (ADVIL) 400 MG tablet Take 400 mg by mouth every 6 (six) hours as needed.     Ibuprofen-diphenhydrAMINE Cit (ADVIL PM PO) Take 1 tablet by mouth at bedtime as needed.     insulin glargine (LANTUS) 100 UNIT/ML injection Inject 0.35 mLs (35 Units total) into the skin 2 (two) times daily. (Patient taking differently: Inject 35 Units into the skin 2 (two) times daily. 35 am , 30 pm) 20 mL 12   Lancets (ONETOUCH ULTRASOFT) lancets Use as instructed to check blood sugar 2 times a day. 200 each 12   triamcinolone cream (KENALOG) 0.1 % Apply TO THE affected area(s) TOPICALLY twice daily AS NEEDED 45 g 0   trolamine salicylate (ASPERCREME) 10 % cream Apply 1 Application topically as needed for muscle pain. Apply to feet 85 g 5   venlafaxine XR (EFFEXOR-XR) 150 MG 24 hr capsule TAKE ONE CAPSULE BY MOUTH EVERY MORNING and TAKE ONE CAPSULE BY MOUTH EVERYDAY AT BEDTIME 180 capsule 3   No current facility-administered medications for this visit.    Review of Systems General: negative for, fevers, chills, fatigue, changes in sleep, changes in weight or appetite Skin: negative for changes in color, texture, moles or lesions Eyes: negative for, changes in vision, pain, diplopia HEENT: negative for,  change in hearing, pain, discharge, tinnitus, vertigo, voice changes, sore throat, neck masses Pulmonary: negative for, dyspnea, orthopnea, productive cough Cardiac: negative for, palpitations, syncope, pain, discomfort, pressure Gastrointestinal: negative for, dysphagia, nausea, vomiting, jaundice, pain, constipation, diarrhea, hematemesis, hematochezia Genitourinary/Sexual: negative for, dysuria, discharge, hesitancy, nocturia, retention, stones, infections, STD's, incontinence Ob/Gyn: negative for, irregular bleeding, pain Musculoskeletal: negative for, pain, stiffness, swelling, range of motion limitation Hematology: negative for, easy bruising, bleeding  Objective:  Physical Examination:  There were no vitals taken for this visit.   ECOG Performance Status: 1 - Symptomatic but completely ambulatory  General appearance: alert, cooperative, and appears stated age HEENT:PERRLA and thyroid without masses Lymph node survey: non-palpable, axillary, inguinal, supraclavicular Cardiovascular: regular rate and rhythm, no murmurs or gallops Respiratory: normal air entry, lungs clear to auscultation Abdomen: no hernias and well healed incision Back: inspection of back is normal Extremities: extremities normal, atraumatic, no cyanosis or edema Skin exam - normal coloration and turgor, no rashes, no suspicious skin lesions noted. Neurological exam reveals alert, oriented, normal speech, no focal findings or movement disorder noted.  Pelvic: exam chaperoned by nurse;  Vulva: atrophic; Vagina: large 2 cm fleshy polyp anterior upper vagina with small stalk, and another smaller nodule on the mucosa, both look benign; Unclear if cervix still present.  Adnexa: negative  Procedure note: Patient identified and signed informed consent with husband present.  Polyp and adjacent nodule biopsied off and sent to path, and silver nitrate applied to stop minimal bleeding.  She had some discomfort, but tolerated  well.  No further bleeding noted after 5 minutes.    Lab Review Labs on site today:   Chemistry      Component Value Date/Time   NA 139 11/19/2022 1058   K 3.7 11/19/2022 1058   CL 103 11/19/2022 1058   CO2 26 11/19/2022 1058   BUN 10 11/19/2022 1058   CREATININE 1.07 (H)  11/19/2022 1058      Component Value Date/Time   CALCIUM 8.9 11/19/2022 1058   ALKPHOS 111 06/30/2022 0829   AST 18 06/30/2022 0829   ALT 14 06/30/2022 0829   BILITOT 0.9 06/30/2022 0829      Lab Results  Component Value Date   WBC 8.3 11/19/2022   HGB 13.3 11/19/2022   HCT 40.5 11/19/2022   MCV 87.3 11/19/2022   PLT 216 11/19/2022   Hgb A1C 10/25/22 = 8.5  Radiologic Imaging: Brain MRI 11/04/22 IMPRESSION: 1. 6.0 x 4.5 x 4.1 cm dural-based mass at the right frontal convexity, significantly increased in size from the prior MRI of 11/16/2020 and most consistent with a meningioma. Progressive mass effect upon the underlying brain parenchyma now with mild underlying parenchymal edema. 8 mm leftward midline shift is new from the prior exam, and there is now a degree of leftward subfalcine herniation. Neurosurgical consultation is recommended. 2. Background moderate chronic small vessel ischemic changes within the cerebral white matter and pons, similar to the prior MRI.    Assessment:  LULANI RUPLINGER is a 72 y.o. female diagnosed with vaginal bleeding due to benign appearing vaginal/cervical polyp. There was also an adjacent nodule that also looked benign.  She had a hysterectomy in her 65s with Dr Barnabas Lister, and suspect she may have had supracervical procedure and that these are endocervical polyps.      Medical co-morbidities complicating care: diabetes type 2, probable meningioma right frontal lobe causing mental status changes.   Plan:   Problem List Items Addressed This Visit       Other   Postmenopausal bleeding - Primary   Vaginal mass    We discussed findings and will await path report  on vaginal biopsies to confirm benign etiology.  She can RTC in 2 months and we can hopefully better ascertain if she has a residual cervix and perhaps do a PAP smear.    She will go ahead with surgery next week to remove 6 cm brain tumor that is thought to be benign, but causing mental status changes.   The patient's diagnosis, an outline of the further diagnostic and laboratory studies which will be required, the recommendation, and alternatives were discussed.  All questions were answered to the patient's satisfaction.  A total of 45 minutes were spent with the patient/family today; 50% was spent in education, counseling and coordination of care for vaginal bleeding due to polyps.      Leida Lauth, MD  CC:  Vanna Scotland, MD [address] [place],  [place] [address] [phone]

## 2022-11-26 LAB — SURGICAL PATHOLOGY

## 2022-12-01 ENCOUNTER — Encounter
Admission: RE | Disposition: A | Discharge status: Home Health | Payer: Self-pay | Source: Home / Self Care | Attending: Neurosurgery

## 2022-12-01 ENCOUNTER — Inpatient Hospital Stay: Payer: Medicare HMO | Admitting: Anesthesiology

## 2022-12-01 ENCOUNTER — Inpatient Hospital Stay
Admission: RE | Admit: 2022-12-01 | Discharge: 2022-12-07 | DRG: 025 | Disposition: A | Discharge status: Home Health | Payer: Medicare HMO | Attending: Neurosurgery | Admitting: Neurosurgery

## 2022-12-01 ENCOUNTER — Inpatient Hospital Stay: Payer: Medicare HMO | Admitting: Urgent Care

## 2022-12-01 ENCOUNTER — Encounter: Payer: Self-pay | Admitting: Neurosurgery

## 2022-12-01 ENCOUNTER — Other Ambulatory Visit: Payer: Self-pay

## 2022-12-01 DIAGNOSIS — D332 Benign neoplasm of brain, unspecified: Secondary | ICD-10-CM | POA: Diagnosis not present

## 2022-12-01 DIAGNOSIS — E1122 Type 2 diabetes mellitus with diabetic chronic kidney disease: Secondary | ICD-10-CM | POA: Diagnosis not present

## 2022-12-01 DIAGNOSIS — G935 Compression of brain: Secondary | ICD-10-CM | POA: Diagnosis present

## 2022-12-01 DIAGNOSIS — N179 Acute kidney failure, unspecified: Secondary | ICD-10-CM | POA: Diagnosis present

## 2022-12-01 DIAGNOSIS — Z01818 Encounter for other preprocedural examination: Secondary | ICD-10-CM

## 2022-12-01 DIAGNOSIS — D496 Neoplasm of unspecified behavior of brain: Secondary | ICD-10-CM | POA: Diagnosis not present

## 2022-12-01 DIAGNOSIS — E1142 Type 2 diabetes mellitus with diabetic polyneuropathy: Secondary | ICD-10-CM

## 2022-12-01 DIAGNOSIS — Z8249 Family history of ischemic heart disease and other diseases of the circulatory system: Secondary | ICD-10-CM

## 2022-12-01 DIAGNOSIS — E1151 Type 2 diabetes mellitus with diabetic peripheral angiopathy without gangrene: Secondary | ICD-10-CM | POA: Diagnosis present

## 2022-12-01 DIAGNOSIS — Z7984 Long term (current) use of oral hypoglycemic drugs: Secondary | ICD-10-CM

## 2022-12-01 DIAGNOSIS — M8580 Other specified disorders of bone density and structure, unspecified site: Secondary | ICD-10-CM | POA: Diagnosis present

## 2022-12-01 DIAGNOSIS — D320 Benign neoplasm of cerebral meninges: Principal | ICD-10-CM | POA: Diagnosis present

## 2022-12-01 DIAGNOSIS — Z79899 Other long term (current) drug therapy: Secondary | ICD-10-CM | POA: Diagnosis not present

## 2022-12-01 DIAGNOSIS — Z90710 Acquired absence of both cervix and uterus: Secondary | ICD-10-CM | POA: Diagnosis not present

## 2022-12-01 DIAGNOSIS — Z823 Family history of stroke: Secondary | ICD-10-CM

## 2022-12-01 DIAGNOSIS — H9190 Unspecified hearing loss, unspecified ear: Secondary | ICD-10-CM | POA: Diagnosis present

## 2022-12-01 DIAGNOSIS — D330 Benign neoplasm of brain, supratentorial: Principal | ICD-10-CM

## 2022-12-01 DIAGNOSIS — Z6838 Body mass index (BMI) 38.0-38.9, adult: Secondary | ICD-10-CM

## 2022-12-01 DIAGNOSIS — Z8673 Personal history of transient ischemic attack (TIA), and cerebral infarction without residual deficits: Secondary | ICD-10-CM

## 2022-12-01 DIAGNOSIS — E1165 Type 2 diabetes mellitus with hyperglycemia: Secondary | ICD-10-CM | POA: Diagnosis present

## 2022-12-01 DIAGNOSIS — Z87891 Personal history of nicotine dependence: Secondary | ICD-10-CM

## 2022-12-01 DIAGNOSIS — N1832 Chronic kidney disease, stage 3b: Secondary | ICD-10-CM | POA: Diagnosis present

## 2022-12-01 DIAGNOSIS — Z888 Allergy status to other drugs, medicaments and biological substances status: Secondary | ICD-10-CM | POA: Diagnosis not present

## 2022-12-01 DIAGNOSIS — F3340 Major depressive disorder, recurrent, in remission, unspecified: Secondary | ICD-10-CM | POA: Diagnosis not present

## 2022-12-01 DIAGNOSIS — E669 Obesity, unspecified: Secondary | ICD-10-CM | POA: Diagnosis not present

## 2022-12-01 DIAGNOSIS — E785 Hyperlipidemia, unspecified: Secondary | ICD-10-CM | POA: Diagnosis not present

## 2022-12-01 DIAGNOSIS — Z9049 Acquired absence of other specified parts of digestive tract: Secondary | ICD-10-CM | POA: Diagnosis not present

## 2022-12-01 DIAGNOSIS — Z794 Long term (current) use of insulin: Secondary | ICD-10-CM

## 2022-12-01 DIAGNOSIS — F05 Delirium due to known physiological condition: Secondary | ICD-10-CM | POA: Diagnosis not present

## 2022-12-01 DIAGNOSIS — C719 Malignant neoplasm of brain, unspecified: Secondary | ICD-10-CM | POA: Diagnosis not present

## 2022-12-01 DIAGNOSIS — R410 Disorientation, unspecified: Secondary | ICD-10-CM | POA: Diagnosis not present

## 2022-12-01 DIAGNOSIS — Z01812 Encounter for preprocedural laboratory examination: Secondary | ICD-10-CM

## 2022-12-01 HISTORY — PX: CRANIOTOMY: SHX93

## 2022-12-01 HISTORY — PX: APPLICATION OF CRANIAL NAVIGATION: SHX6578

## 2022-12-01 LAB — GLUCOSE, CAPILLARY
Glucose-Capillary: 133 mg/dL — ABNORMAL HIGH (ref 70–99)
Glucose-Capillary: 173 mg/dL — ABNORMAL HIGH (ref 70–99)
Glucose-Capillary: 207 mg/dL — ABNORMAL HIGH (ref 70–99)
Glucose-Capillary: 298 mg/dL — ABNORMAL HIGH (ref 70–99)

## 2022-12-01 SURGERY — CRANIOTOMY TUMOR EXCISION
Anesthesia: General | Site: Scalp

## 2022-12-01 MED ORDER — INSULIN GLARGINE-YFGN 100 UNIT/ML ~~LOC~~ SOLN
15.0000 [IU] | Freq: Two times a day (BID) | SUBCUTANEOUS | Status: DC
  Administered 2022-12-01: 15 [IU] via SUBCUTANEOUS
  Filled 2022-12-01 (×2): qty 0.15

## 2022-12-01 MED ORDER — ACETAMINOPHEN 10 MG/ML IV SOLN: 1000.0000 mg | Freq: Once | INTRAVENOUS | Status: DC | PRN

## 2022-12-01 MED ORDER — ACETAMINOPHEN 10 MG/ML IV SOLN
INTRAVENOUS | Status: AC
  Filled 2022-12-01: qty 100

## 2022-12-01 MED ORDER — ROCURONIUM BROMIDE 100 MG/10ML IV SOLN
INTRAVENOUS | Status: DC | PRN
  Administered 2022-12-01: 10 mg via INTRAVENOUS
  Administered 2022-12-01: 60 mg via INTRAVENOUS
  Administered 2022-12-01: 10 mg via INTRAVENOUS

## 2022-12-01 MED ORDER — ACETAMINOPHEN 650 MG RE SUPP: 650.0000 mg | RECTAL | Status: DC | PRN

## 2022-12-01 MED ORDER — BACITRACIN ZINC 500 UNIT/GM EX OINT
TOPICAL_OINTMENT | CUTANEOUS | Status: AC
  Filled 2022-12-01: qty 28.35

## 2022-12-01 MED ORDER — FENTANYL CITRATE (PF) 100 MCG/2ML IJ SOLN
INTRAMUSCULAR | Status: DC | PRN
  Administered 2022-12-01 (×2): 50 ug via INTRAVENOUS

## 2022-12-01 MED ORDER — FAMOTIDINE 20 MG PO TABS
ORAL_TABLET | ORAL | Status: AC
  Filled 2022-12-01: qty 1

## 2022-12-01 MED ORDER — LEVETIRACETAM 500 MG PO TABS
500.0000 mg | ORAL_TABLET | Freq: Two times a day (BID) | ORAL | Status: DC
  Administered 2022-12-01 – 2022-12-02 (×2): 500 mg via ORAL
  Filled 2022-12-01 (×2): qty 1

## 2022-12-01 MED ORDER — ONDANSETRON HCL 4 MG/2ML IJ SOLN: 4.0000 mg | Freq: Once | INTRAMUSCULAR | Status: DC | PRN

## 2022-12-01 MED ORDER — LIDOCAINE-EPINEPHRINE 1 %-1:100000 IJ SOLN
INTRAMUSCULAR | Status: AC
  Filled 2022-12-01: qty 1

## 2022-12-01 MED ORDER — LIDOCAINE-EPINEPHRINE 1 %-1:100000 IJ SOLN
INTRAMUSCULAR | Status: DC | PRN
  Administered 2022-12-01: 10 mL

## 2022-12-01 MED ORDER — DEXAMETHASONE 4 MG PO TABS: 4.0000 mg | ORAL_TABLET | Freq: Three times a day (TID) | ORAL | Status: DC

## 2022-12-01 MED ORDER — GELATIN ABSORBABLE 100 CM EX MISC
CUTANEOUS | Status: DC | PRN
  Administered 2022-12-01: 1 via TOPICAL

## 2022-12-01 MED ORDER — OXYCODONE HCL 5 MG PO TABS: 5.0000 mg | ORAL_TABLET | Freq: Once | ORAL | Status: DC | PRN

## 2022-12-01 MED ORDER — ONDANSETRON HCL 4 MG/2ML IJ SOLN
INTRAMUSCULAR | Status: DC | PRN
  Administered 2022-12-01: 4 mg via INTRAVENOUS

## 2022-12-01 MED ORDER — MAGNESIUM CITRATE PO SOLN: 1.0000 | Freq: Once | ORAL | Status: DC | PRN

## 2022-12-01 MED ORDER — LABETALOL HCL 5 MG/ML IV SOLN: 10.0000 mg | INTRAVENOUS | Status: DC | PRN

## 2022-12-01 MED ORDER — DEXAMETHASONE SODIUM PHOSPHATE 10 MG/ML IJ SOLN
INTRAMUSCULAR | Status: DC | PRN
  Administered 2022-12-01: 10 mg via INTRAVENOUS

## 2022-12-01 MED ORDER — GLIPIZIDE ER 2.5 MG PO TB24
2.5000 mg | ORAL_TABLET | Freq: Every day | ORAL | Status: DC
  Administered 2022-12-02 – 2022-12-07 (×6): 2.5 mg via ORAL
  Filled 2022-12-01 (×6): qty 1

## 2022-12-01 MED ORDER — DEXAMETHASONE 4 MG PO TABS: 4.0000 mg | ORAL_TABLET | Freq: Four times a day (QID) | ORAL | Status: DC

## 2022-12-01 MED ORDER — MANNITOL 20 % IV SOLN
0.5000 g/kg | Freq: Once | INTRAVENOUS | Status: AC
  Filled 2022-12-01: qty 250

## 2022-12-01 MED ORDER — LIDOCAINE HCL (CARDIAC) PF 100 MG/5ML IV SOSY
PREFILLED_SYRINGE | INTRAVENOUS | Status: DC | PRN
  Administered 2022-12-01: 60 mg via INTRAVENOUS

## 2022-12-01 MED ORDER — CEFAZOLIN SODIUM-DEXTROSE 2-4 GM/100ML-% IV SOLN
INTRAVENOUS | Status: AC
  Filled 2022-12-01: qty 100

## 2022-12-01 MED ORDER — BISACODYL 10 MG RE SUPP: 10.0000 mg | Freq: Every day | RECTAL | Status: DC | PRN

## 2022-12-01 MED ORDER — CEFAZOLIN SODIUM-DEXTROSE 2-4 GM/100ML-% IV SOLN
2.0000 g | Freq: Once | INTRAVENOUS | Status: AC
  Administered 2022-12-01: 2 g via INTRAVENOUS

## 2022-12-01 MED ORDER — LEVETIRACETAM 500 MG/5ML IV SOLN
INTRAVENOUS | Status: AC
  Filled 2022-12-01: qty 10

## 2022-12-01 MED ORDER — HYDROMORPHONE HCL 1 MG/ML IJ SOLN
INTRAMUSCULAR | Status: AC
  Filled 2022-12-01: qty 1

## 2022-12-01 MED ORDER — INSULIN ASPART 100 UNIT/ML IJ SOLN
0.0000 [IU] | Freq: Every day | INTRAMUSCULAR | Status: DC
  Administered 2022-12-01: 3 [IU] via SUBCUTANEOUS
  Administered 2022-12-02: 2 [IU] via SUBCUTANEOUS
  Filled 2022-12-01 (×2): qty 1

## 2022-12-01 MED ORDER — DEXAMETHASONE 6 MG PO TABS
6.0000 mg | ORAL_TABLET | Freq: Four times a day (QID) | ORAL | Status: DC
  Administered 2022-12-01 – 2022-12-02 (×3): 6 mg via ORAL
  Filled 2022-12-01 (×5): qty 1

## 2022-12-01 MED ORDER — SURGIFLO WITH THROMBIN (HEMOSTATIC MATRIX KIT) OPTIME
TOPICAL | Status: DC | PRN
  Administered 2022-12-01: 1 via TOPICAL

## 2022-12-01 MED ORDER — DOCUSATE SODIUM 100 MG PO CAPS
100.0000 mg | ORAL_CAPSULE | Freq: Two times a day (BID) | ORAL | Status: DC
  Administered 2022-12-01 – 2022-12-07 (×12): 100 mg via ORAL
  Filled 2022-12-01 (×12): qty 1

## 2022-12-01 MED ORDER — NALOXONE HCL 0.4 MG/ML IJ SOLN: 0.0800 mg | INTRAMUSCULAR | Status: DC | PRN

## 2022-12-01 MED ORDER — PROPOFOL 10 MG/ML IV BOLUS
INTRAVENOUS | Status: DC | PRN
Start: 2022-12-01 — End: 2022-12-01
  Administered 2022-12-01 (×2): 50 mg via INTRAVENOUS
  Administered 2022-12-01: 90 mg via INTRAVENOUS
  Administered 2022-12-01: 20 mg via INTRAVENOUS

## 2022-12-01 MED ORDER — SODIUM CHLORIDE 0.9 % IV SOLN: INTRAVENOUS | Status: DC

## 2022-12-01 MED ORDER — LACTATED RINGERS IV SOLN: INTRAVENOUS | Status: DC | PRN

## 2022-12-01 MED ORDER — ORAL CARE MOUTH RINSE: 15.0000 mL | Freq: Once | OROMUCOSAL | Status: AC

## 2022-12-01 MED ORDER — SUGAMMADEX SODIUM 500 MG/5ML IV SOLN
INTRAVENOUS | Status: DC | PRN
  Administered 2022-12-01: 200 mg via INTRAVENOUS

## 2022-12-01 MED ORDER — PROPOFOL 10 MG/ML IV BOLUS
INTRAVENOUS | Status: AC
  Filled 2022-12-01: qty 20

## 2022-12-01 MED ORDER — CHLORHEXIDINE GLUCONATE 0.12 % MT SOLN
15.0000 mL | Freq: Once | OROMUCOSAL | Status: AC
  Administered 2022-12-01: 15 mL via OROMUCOSAL

## 2022-12-01 MED ORDER — MORPHINE SULFATE (PF) 2 MG/ML IV SOLN: 1.0000 mg | INTRAVENOUS | Status: DC | PRN

## 2022-12-01 MED ORDER — POLYETHYLENE GLYCOL 3350 17 G PO PACK: 17.0000 g | PACK | Freq: Every day | ORAL | Status: DC | PRN

## 2022-12-01 MED ORDER — BACITRACIN 500 UNIT/GM EX OINT
TOPICAL_OINTMENT | CUTANEOUS | Status: DC | PRN
  Administered 2022-12-01 (×2): 1 via TOPICAL

## 2022-12-01 MED ORDER — ENOXAPARIN SODIUM 40 MG/0.4ML IJ SOSY
40.0000 mg | PREFILLED_SYRINGE | INTRAMUSCULAR | Status: DC
  Administered 2022-12-02 – 2022-12-07 (×6): 40 mg via SUBCUTANEOUS
  Filled 2022-12-01 (×6): qty 0.4

## 2022-12-01 MED ORDER — LABETALOL HCL 5 MG/ML IV SOLN
INTRAVENOUS | Status: DC | PRN
Start: 2022-12-01 — End: 2022-12-01
  Administered 2022-12-01 (×2): 2.5 mg via INTRAVENOUS
  Administered 2022-12-01: 5 mg via INTRAVENOUS

## 2022-12-01 MED ORDER — HYDROCODONE-ACETAMINOPHEN 5-325 MG PO TABS
1.0000 | ORAL_TABLET | ORAL | Status: DC | PRN
  Administered 2022-12-02: 1 via ORAL
  Filled 2022-12-01: qty 1

## 2022-12-01 MED ORDER — ONDANSETRON HCL 4 MG/2ML IJ SOLN
4.0000 mg | INTRAMUSCULAR | Status: DC | PRN
  Administered 2022-12-02: 4 mg via INTRAVENOUS
  Filled 2022-12-01: qty 2

## 2022-12-01 MED ORDER — SENNA 8.6 MG PO TABS
1.0000 | ORAL_TABLET | Freq: Two times a day (BID) | ORAL | Status: DC
  Administered 2022-12-01 – 2022-12-07 (×12): 8.6 mg via ORAL
  Filled 2022-12-01 (×12): qty 1

## 2022-12-01 MED ORDER — FENTANYL CITRATE (PF) 100 MCG/2ML IJ SOLN: 25.0000 ug | INTRAMUSCULAR | Status: DC | PRN

## 2022-12-01 MED ORDER — HYDROMORPHONE HCL 1 MG/ML IJ SOLN
INTRAMUSCULAR | Status: DC | PRN
Start: 2022-12-01 — End: 2022-12-01
  Administered 2022-12-01 (×4): .5 mg via INTRAVENOUS

## 2022-12-01 MED ORDER — FENTANYL CITRATE (PF) 100 MCG/2ML IJ SOLN
INTRAMUSCULAR | Status: AC
  Filled 2022-12-01: qty 2

## 2022-12-01 MED ORDER — LEVETIRACETAM IN NACL 500 MG/100ML IV SOLN
500.0000 mg | Freq: Once | INTRAVENOUS | Status: AC
  Administered 2022-12-01: 500 mg via INTRAVENOUS
  Filled 2022-12-01: qty 100

## 2022-12-01 MED ORDER — THROMBIN 5000 UNITS EX SOLR
CUTANEOUS | Status: DC | PRN
  Administered 2022-12-01: 5000 [IU] via TOPICAL

## 2022-12-01 MED ORDER — VENLAFAXINE HCL ER 75 MG PO CP24
150.0000 mg | ORAL_CAPSULE | Freq: Every day | ORAL | Status: DC
  Administered 2022-12-01 – 2022-12-06 (×6): 150 mg via ORAL
  Filled 2022-12-01 (×6): qty 2

## 2022-12-01 MED ORDER — EMPAGLIFLOZIN 25 MG PO TABS
25.0000 mg | ORAL_TABLET | Freq: Every day | ORAL | Status: DC
  Administered 2022-12-02 – 2022-12-07 (×6): 25 mg via ORAL
  Filled 2022-12-01 (×6): qty 1

## 2022-12-01 MED ORDER — PROMETHAZINE HCL 25 MG PO TABS: 12.5000 mg | ORAL_TABLET | ORAL | Status: DC | PRN

## 2022-12-01 MED ORDER — GELATIN ABSORBABLE 100 CM EX MISC
CUTANEOUS | Status: AC
  Filled 2022-12-01: qty 1

## 2022-12-01 MED ORDER — INSULIN ASPART 100 UNIT/ML IJ SOLN
0.0000 [IU] | Freq: Three times a day (TID) | INTRAMUSCULAR | Status: DC
  Administered 2022-12-02: 8 [IU] via SUBCUTANEOUS
  Administered 2022-12-02 (×2): 3 [IU] via SUBCUTANEOUS
  Administered 2022-12-03 – 2022-12-05 (×2): 2 [IU] via SUBCUTANEOUS
  Filled 2022-12-01 (×5): qty 1

## 2022-12-01 MED ORDER — ACETAMINOPHEN 10 MG/ML IV SOLN
INTRAVENOUS | Status: DC | PRN
  Administered 2022-12-01: 1000 mg via INTRAVENOUS

## 2022-12-01 MED ORDER — 0.9 % SODIUM CHLORIDE (POUR BTL) OPTIME
TOPICAL | Status: DC | PRN
  Administered 2022-12-01: 500 mL

## 2022-12-01 MED ORDER — PHENYLEPHRINE HCL-NACL 20-0.9 MG/250ML-% IV SOLN
INTRAVENOUS | Status: AC
  Filled 2022-12-01: qty 250

## 2022-12-01 MED ORDER — FAMOTIDINE 20 MG PO TABS
20.0000 mg | ORAL_TABLET | Freq: Once | ORAL | Status: AC
  Administered 2022-12-01: 20 mg via ORAL

## 2022-12-01 MED ORDER — CHLORHEXIDINE GLUCONATE 0.12 % MT SOLN
OROMUCOSAL | Status: AC
  Filled 2022-12-01: qty 15

## 2022-12-01 MED ORDER — PHENYLEPHRINE HCL-NACL 20-0.9 MG/250ML-% IV SOLN
INTRAVENOUS | Status: DC | PRN
  Administered 2022-12-01: 40 ug/min via INTRAVENOUS
  Administered 2022-12-01: 20 ug/min via INTRAVENOUS

## 2022-12-01 MED ORDER — GABAPENTIN 300 MG PO CAPS
600.0000 mg | ORAL_CAPSULE | Freq: Every day | ORAL | Status: DC
  Administered 2022-12-01 – 2022-12-06 (×6): 600 mg via ORAL
  Filled 2022-12-01 (×6): qty 2

## 2022-12-01 MED ORDER — LIDOCAINE HCL (PF) 2 % IJ SOLN
INTRAMUSCULAR | Status: AC
  Filled 2022-12-01: qty 5

## 2022-12-01 MED ORDER — ACETAMINOPHEN 325 MG PO TABS
650.0000 mg | ORAL_TABLET | ORAL | Status: DC | PRN
  Administered 2022-12-02 – 2022-12-05 (×3): 650 mg via ORAL
  Filled 2022-12-01 (×4): qty 2

## 2022-12-01 MED ORDER — CHLORHEXIDINE GLUCONATE CLOTH 2 % EX PADS
6.0000 | MEDICATED_PAD | Freq: Every day | CUTANEOUS | Status: DC
  Administered 2022-12-01 – 2022-12-03 (×3): 6 via TOPICAL

## 2022-12-01 MED ORDER — THROMBIN 5000 UNITS EX SOLR
CUTANEOUS | Status: AC
  Filled 2022-12-01: qty 5000

## 2022-12-01 MED ORDER — OXYCODONE HCL 5 MG/5ML PO SOLN: 5.0000 mg | Freq: Once | ORAL | Status: DC | PRN

## 2022-12-01 MED ORDER — ONDANSETRON HCL 4 MG PO TABS: 4.0000 mg | ORAL_TABLET | ORAL | Status: DC | PRN

## 2022-12-01 MED ORDER — ROCURONIUM BROMIDE 10 MG/ML (PF) SYRINGE
PREFILLED_SYRINGE | INTRAVENOUS | Status: AC
  Filled 2022-12-01: qty 10

## 2022-12-01 MED ORDER — ESMOLOL HCL 100 MG/10ML IV SOLN
INTRAVENOUS | Status: DC | PRN
  Administered 2022-12-01 (×2): 50 mg via INTRAVENOUS

## 2022-12-01 SURGICAL SUPPLY — 75 items
AGENT HMST KT MTR STRL THRMB (HEMOSTASIS) ×2
APL PRP STRL LF DISP 70% ISPRP (MISCELLANEOUS) ×4
BASIN GRAD PLASTIC 32OZ STRL (MISCELLANEOUS) ×2 IMPLANT
BASIN KIT SINGLE STR (MISCELLANEOUS) ×2 IMPLANT
BLADE CLIPPER SPEC (BLADE) ×2 IMPLANT
BNDG GAUZE DERMACEA FLUFF 4 (GAUZE/BANDAGES/DRESSINGS) ×6 IMPLANT
BNDG GZE DERMACEA 4 6PLY (GAUZE/BANDAGES/DRESSINGS) ×6
BRUSH SCRUB EZ 4% CHG (MISCELLANEOUS) ×2 IMPLANT
BUR ACORN 7.5 PRECISION (BURR) ×2 IMPLANT
BUR SPIRAL ROUTER 2.3 (BUR) ×2 IMPLANT
CASSETTE SUCT IRRIG SONOPET IQ (MISCELLANEOUS) IMPLANT
CHLORAPREP W/TINT 26 (MISCELLANEOUS) ×4 IMPLANT
CNTNR URN SCR LID CUP LEK RST (MISCELLANEOUS) ×4 IMPLANT
CONT SPEC 4OZ STRL OR WHT (MISCELLANEOUS) ×4
COUNTER NEEDLE 20/40 LG (NEEDLE) ×2 IMPLANT
COVERAGE SUPP BRAINLAB NG SPNE (MISCELLANEOUS) ×2 IMPLANT
COVERAGE SUPPORT SPINE BRAINLB (MISCELLANEOUS) ×2
DRAPE SURG 17X11 SM STRL (DRAPES) ×8 IMPLANT
DRAPE WARM FLUID 44X44 (DRAPES) ×2 IMPLANT
DRSG TEGADERM 4X4.75 (GAUZE/BANDAGES/DRESSINGS) IMPLANT
DRSG TELFA 3X4 N-ADH STERILE (GAUZE/BANDAGES/DRESSINGS) ×4 IMPLANT
ELECT CAUTERY BLADE TIP 2.5 (TIP) ×2
ELECT REM PT RETURN 9FT ADLT (ELECTROSURGICAL) ×2
ELECTRODE CAUTERY BLDE TIP 2.5 (TIP) ×2 IMPLANT
ELECTRODE REM PT RTRN 9FT ADLT (ELECTROSURGICAL) ×2 IMPLANT
FEE CVG SUPP BRAINLAB NG SPNE (MISCELLANEOUS) ×2 IMPLANT
GAUZE SPONGE 4X4 12PLY STRL (GAUZE/BANDAGES/DRESSINGS) ×6 IMPLANT
GAUZE XEROFORM 1X8 LF (GAUZE/BANDAGES/DRESSINGS) IMPLANT
GLOVE BIOGEL PI IND STRL 6.5 (GLOVE) ×2 IMPLANT
GLOVE SURG SYN 6.5 ES PF (GLOVE) ×4 IMPLANT
GLOVE SURG SYN 6.5 PF PI (GLOVE) ×4 IMPLANT
GLOVE SURG SYN 8.5 E (GLOVE) ×6 IMPLANT
GLOVE SURG SYN 8.5 PF PI (GLOVE) ×6 IMPLANT
GOWN SRG LRG LVL 4 IMPRV REINF (GOWNS) ×2 IMPLANT
GOWN SRG XL LVL 3 NONREINFORCE (GOWNS) ×2 IMPLANT
GOWN STRL NON-REIN TWL XL LVL3 (GOWNS) ×2
GOWN STRL REIN LRG LVL4 (GOWNS) ×2
GRADUATE 1200CC STRL 31836 (MISCELLANEOUS) ×2 IMPLANT
GRAFT DURAGEN MATRIX 3WX3L (Graft) ×2 IMPLANT
GRAFT DURAGEN MATRIX 3X3 SNGL (Graft) IMPLANT
HEMOSTAT SURGICEL 2X14 (HEMOSTASIS) ×2 IMPLANT
HOLDER FOLEY CATH W/STRAP (MISCELLANEOUS) ×2 IMPLANT
HOOK STAY BLUNT/RETRACTOR 5M (MISCELLANEOUS) IMPLANT
KIT TURNOVER KIT A (KITS) ×2 IMPLANT
MANIFOLD NEPTUNE II (INSTRUMENTS) ×2 IMPLANT
MARKER SKIN DUAL TIP RULER LAB (MISCELLANEOUS) ×4 IMPLANT
MARKER SPHERE PSV REFLC 13MM (MARKER) ×4 IMPLANT
MAT ABSORB FLUID 56X50 GRAY (MISCELLANEOUS) ×2 IMPLANT
NDL HYPO 22X1.5 SAFETY MO (MISCELLANEOUS) ×2 IMPLANT
NEEDLE HYPO 22X1.5 SAFETY MO (MISCELLANEOUS) ×2 IMPLANT
NS IRRIG 1000ML POUR BTL (IV SOLUTION) ×4 IMPLANT
PACK CRANIOTOMY CUSTOM (CUSTOM PROCEDURE TRAY) ×2 IMPLANT
PAD ARMBOARD 7.5X6 YLW CONV (MISCELLANEOUS) ×6 IMPLANT
PATTIES SURGICAL .5 X.5 (GAUZE/BANDAGES/DRESSINGS) IMPLANT
PIN MAYFIELD SKULL DISP (PIN) ×2 IMPLANT
PLATE 1.5/0.5 18.5MM BURR HOLE (Plate) IMPLANT
SCREW SELF DRILL HT 1.5/4MM (Screw) IMPLANT
SHEET NEURO XL SOL CTL (MISCELLANEOUS) ×2 IMPLANT
SOCK SONOPET SPECIMEN (MISCELLANEOUS) IMPLANT
SOL PREP PVP 2OZ (MISCELLANEOUS) ×2
SOL SCRUB PVP POV-IOD 4OZ 7.5% (MISCELLANEOUS) ×2
SOLUTION PREP PVP 2OZ (MISCELLANEOUS) ×2 IMPLANT
SOLUTION SCRB POV-IOD 4OZ 7.5% (MISCELLANEOUS) ×2 IMPLANT
STAPLER SKIN PROX 35W (STAPLE) ×4 IMPLANT
SURGIFLO W/THROMBIN 8M KIT (HEMOSTASIS) ×2 IMPLANT
SURGILUBE 2OZ TUBE FLIPTOP (MISCELLANEOUS) IMPLANT
SUT MNCRL 3-0 UNDYED SH (SUTURE) ×2 IMPLANT
SUT NURALON 4 0 TR CR/8 (SUTURE) IMPLANT
SUT VIC AB 2-0 CT1 18 (SUTURE) ×6 IMPLANT
TAPE CLOTH 3X10 WHT NS LF (GAUZE/BANDAGES/DRESSINGS) ×6 IMPLANT
TIP TISSUE SONOPET IQ STD 12 (TIP) IMPLANT
TOWEL OR 17X26 4PK STRL BLUE (TOWEL DISPOSABLE) ×6 IMPLANT
TRAP FLUID SMOKE EVACUATOR (MISCELLANEOUS) ×2 IMPLANT
TRAY FOLEY SLVR 16FR LF STAT (SET/KITS/TRAYS/PACK) ×2 IMPLANT
WATER STERILE IRR 1000ML POUR (IV SOLUTION) ×2 IMPLANT

## 2022-12-01 NOTE — Interval H&P Note (Signed)
History and Physical Interval Note:  12/01/2022 1:54 PM  Jacqueline Cole  has presented today for surgery, with the diagnosis of D49.6 Brain tumor.  The various methods of treatment have been discussed with the patient and family. After consideration of risks, benefits and other options for treatment, the patient has consented to  Procedure(s): RIGHT FRONTAL CRANIOTOMY FOR TUMOR EXCISION (N/A) APPLICATION OF CRANIAL NAVIGATION (N/A) as a surgical intervention.  The patient's history has been reviewed, patient examined, no change in status, stable for surgery.  I have reviewed the patient's chart and labs.  Questions were answered to the patient's satisfaction.    Heart sounds normal no MRG. Chest Clear to Auscultation Bilaterally.  Damien Cisar

## 2022-12-01 NOTE — Anesthesia Postprocedure Evaluation (Signed)
Anesthesia Post Note  Patient: Carmine T Leaver  Procedure(s) Performed: RIGHT FRONTAL CRANIOTOMY FOR TUMOR EXCISION (Head) APPLICATION OF CRANIAL NAVIGATION (Scalp)  Patient location during evaluation: PACU Anesthesia Type: General Level of consciousness: awake and responds to stimulation Pain management: pain level controlled Vital Signs Assessment: post-procedure vital signs reviewed and stable Respiratory status: spontaneous breathing, nonlabored ventilation and respiratory function stable Cardiovascular status: blood pressure returned to baseline and stable Postop Assessment: no apparent nausea or vomiting Anesthetic complications: no   No notable events documented.   Last Vitals:  Vitals:   12/01/22 1845 12/01/22 1900  BP: (!) 127/53 (!) 139/58  Pulse: 78 84  Resp: 19   Temp: (!) 36.2 C   SpO2: 92% 94%    Last Pain:  Vitals:   12/01/22 1845  TempSrc:   PainSc: 0-No pain                 Reed Breech

## 2022-12-01 NOTE — Progress Notes (Signed)
eLink Physician-Brief Progress Note Patient Name: ESSA AREHART DOB: 11-Mar-1951 MRN: [id number]   Date of Service  12/01/2022  HPI/Events of Note  30F with meningioma, DM2, CKD IIIB, osteopenia, hearing loss, vaginal bleeding admitted for elective brain mass resection. On camera check patient s/p crani for tumor resection. On [place], resting comfortably. No pressor support.  [facility] primary team  eICU Interventions  Elink available as needed     Intervention Category Evaluation Type: New Patient Evaluation  Savva Beamer Mechele Collin 12/01/2022, 7:33 PM

## 2022-12-01 NOTE — Transfer of Care (Signed)
Immediate Anesthesia Transfer of Care Note  Patient: Jovana T Masi  Procedure(s) Performed: RIGHT FRONTAL CRANIOTOMY FOR TUMOR EXCISION (Head) APPLICATION OF CRANIAL NAVIGATION (Scalp)  Patient Location: PACU  Anesthesia Type:General  Level of Consciousness: drowsy  Airway & Oxygen Therapy: Patient Spontanous Breathing and Patient connected to face mask oxygen  Post-op Assessment: Report given to RN and Post -op Vital signs reviewed and stable  Post vital signs: Reviewed  Last Vitals:  Vitals Value Taken Time  BP 132/54 12/01/22 1830  Temp 23F   Pulse 73 12/01/22 1835  Resp 10 12/01/22 1835  SpO2 90 % 12/01/22 1835  Vitals shown include unfiled device data.  Last Pain:  Vitals:   12/01/22 1219  TempSrc: Temporal  PainSc: 0-No pain         Complications: No notable events documented.

## 2022-12-01 NOTE — Anesthesia Preprocedure Evaluation (Signed)
Anesthesia Evaluation  Patient identified by MRN, date of birth, ID band Patient awake and Patient confused  General Assessment Comment:  Patient is AO x 3, however she appears confused with some of my questions and preoperative explanations; she frequently turns to her husband at bedside for help answering questions. I do not believe she has complete capacity to provide informed consent for this procedure. Husband provides informed consent  Reviewed: Allergy & Precautions, NPO status , Patient's Chart, lab work & pertinent test results  History of Anesthesia Complications (+) DIFFICULT IV STICK / SPECIAL LINE and history of anesthetic complications  Airway Mallampati: III  TM Distance: <3 FB Neck ROM: full    Dental  (+) Upper Dentures, Lower Dentures   Pulmonary neg pulmonary ROS, neg shortness of breath, neg sleep apnea, neg COPD, Patient abstained from smoking.Not current smoker, former smoker   Pulmonary exam normal breath sounds clear to auscultation       Cardiovascular Exercise Tolerance: Good METS(-) hypertension+ Peripheral Vascular Disease  (-) CAD and (-) Past MI Normal cardiovascular exam(-) dysrhythmias  Rhythm:Regular Rate:Normal - Systolic murmurs    Neuro/Psych  Headaches PSYCHIATRIC DISORDERS  Depression    6 [facility] meningioma  Neuromuscular disease CVA, No Residual Symptoms    GI/Hepatic negative GI ROS, Neg liver ROS,neg GERD  ,,  Endo/Other  diabetes, Type 2, Insulin Dependent    Renal/GU CRFRenal disease  negative genitourinary   Musculoskeletal   Abdominal   Peds  Hematology negative hematology ROS (+)   Anesthesia Other Findings Patient is NPO appropriate and reports no nausea or vomiting today.  Past Medical History: No date: Depression No date: Diabetes mellitus type II No date: Hyperlipemia 09/01/2006: Major depressive disorder, recurrent, in remission (HCC)     Comment:  Qualifier:  Diagnosis of  By: [name]   No date: Obesity No date: Osteopenia 1998: Syncope     Comment:  DM diagnosis  Past Surgical History: ~2005: ABDOMINAL HYSTERECTOMY     Comment:  and BSO 01/05/2019: BREAST BIOPSY; Right     Comment:  rt stereo bx 2 areas 1st area ribbon clip 01/05/2019: BREAST BIOPSY; Right     Comment:  rt stereo bx 2nd area coil clip 1975: CHOLECYSTECTOMY No date: COSMETIC SURGERY     Comment:  Injury Right face as a child No date: VAGINAL DELIVERY     Comment:  X 2  BMI    Body Mass Index: 25.01 kg/m      Reproductive/Obstetrics negative OB ROS                             Anesthesia Physical Anesthesia Plan  ASA: 3  Anesthesia Plan: General   Post-op Pain Management: Ofirmev IV (intra-op)*   Induction: Intravenous  PONV Risk Score and Plan: 2 and Ondansetron, Dexamethasone and Treatment may vary due to age or medical condition  Airway Management Planned: Oral ETT and Video Laryngoscope Planned  Additional Equipment: Arterial line  Intra-op Plan:   Post-operative Plan: Extubation in OR and Possible Post-op intubation/ventilation  Informed Consent: I have reviewed the patients History and Physical, chart, labs and discussed the procedure including the risks, benefits and alternatives for the proposed anesthesia with the patient or authorized representative who has indicated his/her understanding and acceptance.   Patient has DNR.  Discussed DNR with patient, Discussed DNR with power of attorney and Suspend DNR.   Dental advisory given  Plan Discussed with: CRNA and  Surgeon  Anesthesia Plan Comments: (Discussed risks of anesthesia with patient as well as her spouse/POA at bedside, including PONV, sore throat, lip/dental/eye damage. Rare risks discussed as well, such as cardiorespiratory and neurological sequelae, and allergic reactions. Discussed the role of CRNA in patient's perioperative care.  Discussed post  induction arterial line. Discussed potential for prolonged intubation.  Patient says she has living will which states DNR/DNI. I discussed this with the spouse and her; it seems she does not want long-term artificial life prolonging treatment such as prolonged intubation, however the spouse clarified that for acute situations such as in the event of a perioperative cardiac arrest, she would accept chest compressions and defibrillation.  Patient and spouse understand.)        Anesthesia Quick Evaluation

## 2022-12-01 NOTE — Plan of Care (Signed)

## 2022-12-01 NOTE — Anesthesia Procedure Notes (Signed)
Arterial Line Insertion Performed by: Corinda Gubler, MD, anesthesiologist  Patient location: OR. Preanesthetic checklist: patient identified, IV checked, site marked, risks and benefits discussed, surgical consent, monitors and equipment checked, pre-op evaluation, timeout performed and anesthesia consent Patient sedated Right, radial was placed Catheter size: 20 G Hand hygiene performed  and maximum sterile barriers used   Attempts: 1 Procedure performed using ultrasound guided technique. Ultrasound Notes:anatomy identified, needle tip was noted to be adjacent to the nerve/plexus identified and no ultrasound evidence of intravascular and/or intraneural injection Following insertion, dressing applied. Post procedure assessment: normal and unchanged  Patient tolerated the procedure well with no immediate complications.

## 2022-12-01 NOTE — Anesthesia Procedure Notes (Signed)
Procedure Name: Intubation Date/Time: 12/01/2022 2:40 PM  Performed by: Katherine Basset, CRNAPre-anesthesia Checklist: Patient identified, Emergency Drugs available, Suction available and Patient being monitored Patient Re-evaluated:Patient Re-evaluated prior to induction Oxygen Delivery Method: Circle system utilized Preoxygenation: Pre-oxygenation with 100% oxygen Induction Type: IV induction Ventilation: Mask ventilation without difficulty and Oral airway inserted - appropriate to patient size Laryngoscope Size: McGraph and 3 Grade View: Grade I Tube type: Oral Tube size: 6.5 mm Number of attempts: 1 Airway Equipment and Method: Stylet, Oral airway, LTA kit utilized and Bite block Placement Confirmation: ETT inserted through vocal cords under direct vision, positive ETCO2 and breath sounds checked- equal and bilateral Secured at: 21 cm Tube secured with: Tape Dental Injury: Teeth and Oropharynx as per pre-operative assessment

## 2022-12-01 NOTE — Anesthesia Procedure Notes (Signed)
Arterial Line Insertion Performed by: Corinda Gubler, MD, Katherine Basset, CRNA, CRNA  Patient location: OR. Preanesthetic checklist: patient identified, IV checked, site marked, risks and benefits discussed, surgical consent, monitors and equipment checked, pre-op evaluation, timeout performed and anesthesia consent Patient sedated Left, radial was placed Catheter size: 20 G Hand hygiene performed  and maximum sterile barriers used   Attempts: 2 Procedure performed using ultrasound guided technique. Ultrasound Notes:anatomy identified, needle tip was noted to be adjacent to the nerve/plexus identified and no ultrasound evidence of intravascular and/or intraneural injection Following insertion, dressing applied. Post procedure assessment: normal and unchanged  Post procedure complications: second provider assisted and unsuccessful attempts. Patient tolerated the procedure well with no immediate complications. Additional procedure comments: First attempt under USG by CRNA unsuccessful resulting in hematoma. Second attempt by MD under USG successful.Marland Kitchen

## 2022-12-01 NOTE — Op Note (Addendum)
Indications: The patient is a 72yo female who presented with a brain tumor.  Due to growth of the tumor, resection was recommended.  Findings: dural based tumor  Preoperative Diagnosis: brain tumor Postoperative Diagnosis: same   EBL: 100 ml IVF: see anesthesia record Drains: none Disposition: Extubated and Stable to PACU Complications: none  A foley catheter was placed.   Preoperative Note:   Risks of surgery discussed include: infection, bleeding, stroke, coma, death, paralysis, CSF leak, nerve/spinal cord injury, numbness, tingling, weakness, vascular injury, need for further surgery, persistent symptoms, and the risks of anesthesia. The patient understood these risks and agreed to proceed.  NAME OF PROCEDURE:               1. Right Craniotomy for Tumor resection 2. Use of stereotaxis for planning  PROCEDURE:  Patient was brought to the operating room, intubated. The mayfield pins were applied.  The patient was then positioned for a right-sided frontal craniotomy.  The stereotactic images were registered to the patient's skin so that it could be used for planning.  Mannitol, dexamethasone, and keppra were given.  The incision was planned, then prepped and draped in standard fashion.  The incision was opened sharply, then the galea opened.  Fish hooks were used to retract the flap to expose the coronal suture and frontal and parietal bones on the right.  The craniotomy site was re-confirmed with stereotaxis.  A craniotomy was then fashioned with the burr and craniotome.  The dura was identified, then opened sharply.  The brain was visualized.  The interface between the brain tumor and the cortex was identified.  This was then carefully developed. The tumor was attached to the dura and was extrinsic to the brain, but was adherent in several locations.  Bipolar electrocautery was used to coagulate any bridging vessels to the tumor.  The tumor was circumferentially dissected from the  normal brain until there were no longer any attachments.  The dural attachment was identified and scissors were used to remove the affected dura.  The tumor was then handed off en bloc for pathology.  A small area medially was in close contact in proximity with the superior sagittal sinus.  It was felt that leaving this small portion of the tumor was safest for the patient.  It was then carefully coagulated to inhibit growth.  The tumor resection cavity was visually inspected.  Hemostasis was achieved.  The tumor bed was lined with Surgicel and irrigated profusely.  At this point, the unprotected craniotome was used to fashion tack up points.  4-0 Nurolon suture was used to tack up the edges.  DuraGen was placed in the dural defect.  The bone flap was then secured using 4 plates.  8 total screws were used to secure the plates.  The bone flap was then copiously irrigated.  We then turned attention to closure.  The craniotomy site was checked and a fixation plate used to reconstruct the skull.  The galea was closed.  A running monocryl was used on the skin.  A sterile dressing was placed.    Needle, lap and all counts were correct at the end of the case.    Manning Charity PA assisted in the procedure. An assistant was required for this procedure due to the complexity.  The assistant provided assistance in tissue manipulation and suction, and was required for the successful and safe performance of the procedure. I performed the critical portions of the procedure.    Venetia Night  MD Neurosurgery  The following cranial fixation plates were used: 4 5 hole plates We used 8 total 4 mm screws.

## 2022-12-02 ENCOUNTER — Encounter: Payer: Self-pay | Admitting: Neurosurgery

## 2022-12-02 LAB — GLUCOSE, CAPILLARY
Glucose-Capillary: 289 mg/dL — ABNORMAL HIGH (ref 70–99)
Glucose-Capillary: 197 mg/dL — ABNORMAL HIGH (ref 70–99)
Glucose-Capillary: 200 mg/dL — ABNORMAL HIGH (ref 70–99)
Glucose-Capillary: 248 mg/dL — ABNORMAL HIGH (ref 70–99)

## 2022-12-02 MED ORDER — HALOPERIDOL LACTATE 5 MG/ML IJ SOLN
1.0000 mg | Freq: Once | INTRAMUSCULAR | Status: AC
  Administered 2022-12-02: 1 mg via INTRAVENOUS
  Filled 2022-12-02: qty 1

## 2022-12-02 MED ORDER — INSULIN GLARGINE-YFGN 100 UNIT/ML ~~LOC~~ SOLN
20.0000 [IU] | Freq: Two times a day (BID) | SUBCUTANEOUS | Status: DC
  Administered 2022-12-02 – 2022-12-07 (×9): 20 [IU] via SUBCUTANEOUS
  Filled 2022-12-02 (×11): qty 0.2

## 2022-12-02 MED ORDER — DIAZEPAM 2 MG PO TABS
2.0000 mg | ORAL_TABLET | Freq: Three times a day (TID) | ORAL | Status: DC | PRN
  Administered 2022-12-02 (×2): 2 mg via ORAL
  Filled 2022-12-02 (×2): qty 1

## 2022-12-02 MED ORDER — HALOPERIDOL LACTATE 5 MG/ML IJ SOLN
2.0000 mg | Freq: Once | INTRAMUSCULAR | Status: AC
  Administered 2022-12-02: 2 mg via INTRAVENOUS

## 2022-12-02 MED ORDER — HALOPERIDOL LACTATE 5 MG/ML IJ SOLN
2.0000 mg | Freq: Four times a day (QID) | INTRAMUSCULAR | Status: DC | PRN
  Filled 2022-12-02: qty 1

## 2022-12-02 MED ORDER — HALOPERIDOL LACTATE 5 MG/ML IJ SOLN
INTRAMUSCULAR | Status: AC
  Administered 2022-12-02: 2 mg via INTRAVENOUS
  Filled 2022-12-02: qty 1

## 2022-12-02 NOTE — Inpatient Diabetes Management (Signed)
Inpatient Diabetes Program Recommendations  AACE/ADA: New Consensus Statement on Inpatient Glycemic Control (2015)  Target Ranges:  Prepandial:   less than 140 mg/dL      Peak postprandial:   less than 180 mg/dL (1-2 hours)      Critically ill patients:  140 - 180 mg/dL   Lab Results  Component Value Date   GLUCAP 289 (H) 12/02/2022   HGBA1C 8.5 (A) 10/25/2022    Review of Glycemic Control  Latest Reference Range & Units 12/01/22 12:18 12/01/22 18:28 12/01/22 19:17 12/01/22 22:12 12/02/22 07:20  Glucose-Capillary 70 - 99 mg/dL 109 (H) 604 (H) 540 (H) 298 (H) 289 (H)  (H): Data is abnormally high  Diabetes history: DM2  Outpatient Diabetes medications:  Lantus 35 units QAM, 30 units at bedtime Jardiance 25 mg every day Glipizde 2.5 mg QAM FSL 3  Current orders for Inpatient glycemic control:  Semglee 15 units BID Novolog 0-15 units TID and 0-5 units at bedtime Jardiance 25 every day Decadron taper  Inpatient Diabetes Program Recommendations:    While on steroids, might consider:  Novolog 0-20 units TID and HS Semglee 20 units BID   Will continue to follow while inpatient.  Thank you, Dulce Sellar, MSN, CDCES Diabetes Coordinator Inpatient Diabetes Program [phone] (team pager from 8a-5p)
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
## 2022-12-02 NOTE — Progress Notes (Signed)
Neurosurgery Progress Note  History: Jacqueline Cole is s/p right craniotomy for tumor resection  POD1: Pt complaining of headache otherwise at baseline per husband at bedside  Physical Exam: Vitals:   12/02/22 0600 12/02/22 0700  BP: (!) 144/65 139/75  Pulse: 97 98  Resp: 17 19  Temp:    SpO2: 93% 91%    AA Ox3 CNI No drift  Strength:5/5 throughout  Incision covered with clean post-op dressing   Data:  Other tests/results:  Path pending   Assessment/Plan:  Jacqueline Cole is a 72 y.o s/p right craniotomy for tumor resection.   - mobilize - pain control - DVT prophylaxis - will continue to monitor blood glucose. SSI ordered - steroid taper - will transfer to step down.  - PTOT  Manning Charity PA-C Department of Neurosurgery

## 2022-12-02 NOTE — Progress Notes (Signed)
Inpatient Rehab Admissions Coordinator Note:   Per therapy recommendations patient was screened for CIR candidacy by Stephania Fragmin, PT. At this time, pt appears to be a potential candidate for CIR. I will place an order for rehab consult for full assessment, per our protocol.  Please contact me any with questions.Estill Dooms, PT, DPT [phone] 12/02/22 2:07 PM

## 2022-12-02 NOTE — Progress Notes (Signed)
   12/02/22 1100  Spiritual Encounters  Type of Visit Initial  Care provided to: Pt and family  Conversation partners present during encounter Nurse  Referral source Family  Reason for visit Routine spiritual support  OnCall Visit Yes  Spiritual Framework  Presenting Themes Coping tools;Impactful experiences and emotions;Courage hope and growth  Patient Stress Factors Health changes   Chaplain met patients sister in the ICU waiting room and talked with her. Sister asked the chaplain to go and see the patient and also talk with her husband. Chaplain met patient and her husband and spent time with them. Chaplain talked with husband because he was very tearful about some of the things his wife has been saying. He stated that she is having a hard time with her memory and is making up stories. Chaplain offered compassionate presence and offered him words of hope and encouragement.

## 2022-12-02 NOTE — Evaluation (Signed)
Occupational Therapy Evaluation Patient Details Name: Jacqueline Cole MRN: [id number] DOB: 1951-02-14 Today's Date: 12/02/2022   History of Present Illness Pt is a 72 y/o female presenting s/p right frontal craniotomy for tumor excision. PMH includes memory deficits, PVD, CVA, T2D, HLD, obesity, and osteopenia.   Clinical Impression   Pt was seen for OT evaluation this date and co-tx with PT to optimize safety with ADL/mobility 2/2 cognitive deficits. Prior to hospital admission, pt was using a rollator for mobility and received assist for showering and dressing from her spouse, per spouse report as pt is an unreliable historian at time of evaluation. Pt lives in a 2 story home with 6 STE but able to live on the main floor of the home. Pt presents to acute OT demonstrating impaired ADL performance and functional mobility 2/2 impaired cognition, balance, and strength (See OT problem list for additional functional deficits). Pt currently requires MIN A +2 for bed mobility and transfers with RW and MAX VC for safety. VC to redirect to task throughout session and emotionally labile throughout. Pt would benefit from high intensity skilled OT services to address noted impairments and functional limitations (see below for any additional details) in order to maximize safety and independence while minimizing falls risk and caregiver burden.     If plan is discharge home, recommend the following: A lot of help with walking and/or transfers;A lot of help with bathing/dressing/bathroom;Assistance with cooking/housework;Assist for transportation;Direct supervision/assist for medications management;Supervision due to cognitive status;Direct supervision/assist for financial management;Help with stairs or ramp for entrance    Functional Status Assessment  Patient has had a recent decline in their functional status and demonstrates the ability to make significant improvements in function in a reasonable and  predictable amount of time.  Equipment Recommendations  Other (comment) (defer to next venue)    Recommendations for Other Services       Precautions / Restrictions Precautions Precautions: Fall Restrictions Weight Bearing Restrictions: No      Mobility Bed Mobility Overal bed mobility: Needs Assistance Bed Mobility: Supine to Sit, Sit to Supine     Supine to sit: Min assist, Mod assist, +2 for safety/equipment Sit to supine: Min assist, +2 for safety/equipment        Transfers Overall transfer level: Needs assistance Equipment used: Rolling walker (2 wheels) Transfers: Sit to/from Stand Sit to Stand: Min assist, +2 safety/equipment           General transfer comment: required frequent redirection/multimodal cueing to initiate transfer      Balance Overall balance assessment: Needs assistance Sitting-balance support: Bilateral upper extremity supported, Feet supported, Feet unsupported Sitting balance-Leahy Scale: Fair     Standing balance support: Bilateral upper extremity supported, During functional activity, Reliant on assistive device for balance Standing balance-Leahy Scale: Fair                             ADL either performed or assessed with clinical judgement   ADL Overall ADL's : Needs assistance/impaired                     Lower Body Dressing: Sitting/lateral leans;Minimal assistance;Cueing for sequencing;Cueing for safety Lower Body Dressing Details (indicate cue type and reason): Pt doffed sock when asked if she could pull the sock up to adjust it, then attempted to remove again once on requiring VC and TC to redirect to different task  Functional mobility during ADLs: Minimal assistance;Cueing for safety;Rolling walker (2 wheels);Cueing for sequencing;+2 for physical assistance General ADL Comments: requires multimodal cues to attend to tasks and redirect 2/2 lability     Vision         Perception          Praxis         Pertinent Vitals/Pain Pain Assessment Pain Assessment: Faces Faces Pain Scale: Hurts whole lot Pain Location: headache that increases with mobility Pain Descriptors / Indicators: Headache, Discomfort Pain Intervention(s): Limited activity within patient's tolerance, Monitored during session, Repositioned     Extremity/Trunk Assessment Upper Extremity Assessment Upper Extremity Assessment: Generalized weakness;Difficult to assess due to impaired cognition   Lower Extremity Assessment Lower Extremity Assessment: Generalized weakness;Difficult to assess due to impaired cognition       Communication Communication Communication: Difficulty following commands/understanding Following commands: Follows one step commands inconsistently Cueing Techniques: Verbal cues;Gestural cues;Tactile cues;Visual cues   Cognition Arousal: Alert Behavior During Therapy: Restless, Lability Overall Cognitive Status: Impaired/Different from baseline Area of Impairment: Memory, Orientation, Following commands, Safety/judgement, Problem solving                 Orientation Level: Disoriented to, Place, Time, Situation   Memory: Decreased short-term memory Following Commands: Follows one step commands inconsistently Safety/Judgement: Decreased awareness of deficits, Decreased awareness of safety   Problem Solving: Slow processing, Difficulty sequencing, Requires verbal cues, Requires tactile cues General Comments: Oriented to self; per RN normally oriented x3; MAX VC for redirecting to tasks, very labile perseverating on husband and things that she believes to be true despite reassurance throughout session from family and therapists     General Comments  Vitals in sitting: BP 141/90 (108) HR 103, attempted standing but unable to obtain accurate reading, supine at end of session BP 133/59(79) HR 95    Exercises     Shoulder Instructions      Home Living Family/patient  expects to be discharged to:: Private residence Living Arrangements: Spouse/significant other Available Help at Discharge: Family;Available 24 hours/day Type of Home: House Home Access: Stairs to enter [employer] of Steps: 6 Entrance Stairs-Rails: Can reach both Home Layout: Two level;Able to live on main level with bedroom/bathroom;Full bath on main level     Bathroom Shower/Tub: Chief Strategy Officer: Standard     Home Equipment: BSC/3in1;Shower seat;Standard Walker;Rollator (4 wheels);Wheelchair - manual   Additional Comments: 3 falls in the last 6 months      Prior Functioning/Environment Prior Level of Function : Needs assist       Physical Assist : ADLs (physical)   ADLs (physical): Bathing;Dressing;IADLs Mobility Comments: uses WC occasionally, uses rollator ADLs Comments: assistance for showering, dressing, husband performs IADLs        OT Problem List: Decreased strength;Pain;Decreased cognition;Decreased safety awareness;Decreased activity tolerance;Impaired balance (sitting and/or standing);Decreased knowledge of use of DME or AE;Obesity      OT Treatment/Interventions: Self-care/ADL training;Therapeutic exercise;Therapeutic activities;Neuromuscular education;Cognitive remediation/compensation;Energy conservation;DME and/or AE instruction;Patient/family education;Balance training    OT Goals(Current goals can be found in the care plan section) Acute Rehab OT Goals Patient Stated Goal: go home OT Goal Formulation: With patient/family Time For Goal Achievement: 12/16/22 Potential to Achieve Goals: Good ADL Goals Pt Will Perform Lower Body Dressing: with min assist;sit to/from stand (PRN VC for attention, sequencing) Pt Will Transfer to Toilet: with contact guard assist;ambulating;bedside commode (LRAD) Pt Will Perform Toileting - Clothing Manipulation and hygiene: with supervision;sitting/lateral leans Additional ADL Goal #1: Pt  will  complete ADL task requiring MIN VC for initiation/attention to task/safety, 4/4 opportunities. Additional ADL Goal #2: Pt will complete all aspects of bathing primarily from seated position requiring supv and MIN VC for sequencing/safety/initiation, 2/2 opportunities.  OT Frequency: Min 1X/week    Co-evaluation PT/OT/SLP Co-Evaluation/Treatment: Yes Reason for Co-Treatment: Complexity of the patient's impairments (multi-system involvement);Necessary to address cognition/behavior during functional activity;For patient/therapist safety;To address functional/ADL transfers PT goals addressed during session: Mobility/safety with mobility OT goals addressed during session: ADL's and self-care      AM-PAC OT "6 Clicks" Daily Activity     Outcome Measure Help from another person eating meals?: None Help from another person taking care of personal grooming?: A Little Help from another person toileting, which includes using toliet, bedpan, or urinal?: A Lot Help from another person bathing (including washing, rinsing, drying)?: A Lot Help from another person to put on and taking off regular upper body clothing?: A Little Help from another person to put on and taking off regular lower body clothing?: A Lot 6 Click Score: 16   End of Session Equipment Utilized During Treatment: Gait belt;Oxygen;Rolling walker (2 wheels) Nurse Communication: Mobility status  Activity Tolerance: Patient tolerated treatment well Patient left: in bed;with call bell/phone within reach;with bed alarm set;with family/visitor present  OT Visit Diagnosis: Other abnormalities of gait and mobility (R26.89);Other symptoms and signs involving cognitive function;Muscle weakness (generalized) (M62.81)                Time: 1308-6578 OT Time Calculation (min): 34 min Charges:  OT General Charges $OT Visit: 1 Visit OT Evaluation $OT Eval Moderate Complexity: 1 Mod OT Treatments $Self Care/Home Management : 8-22 mins  Arman Filter., MPH, MS, OTR/L ascom [phone] 12/02/22, 1:50 PM

## 2022-12-02 NOTE — Plan of Care (Signed)

## 2022-12-02 NOTE — Evaluation (Addendum)
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 PMH includes memory deficits, PVD, CVA, T2D, HLD, obesity, and osteopenia.  Clinical Impression   Pt presents laying in bed with complaints of headache that worsens with mobility. She lives in a 2 level house with 6 stairs to enter and access to bedroom/bathroom on main floor. PTA she primarily used a rollator for mobility and received assistance from husband showering/dressing. PLOF and home set up collected from husband, pt poor historian at this time.   PT/OT cotreat performed for pt/therapist safety and due to current cognitive state. BP attempted with positional changes, but unremarkable (unable to obtain in standing). She performed supine<>sit and sit<>stand with minAx2/RW for safety. She was able to perform 1 lateral step toward [facility] with minAx2 and therapist facilitation of RW. Pt with emotional lability throughout, and displayed decreased situational awareness and was highly distractible. Required constant intervention from therapists and frequent multimodal cueing to stay on task. She would benefit from high intensity skilled PT services to maximize functional activities, per family able to provide 24/7 supervision/assistance.       If plan is discharge home, recommend the following: A lot of help with walking and/or transfers;A lot of help with bathing/dressing/bathroom;Assistance with cooking/housework;Assistance with feeding;Help with stairs or ramp for entrance;Assist for transportation;Direct supervision/assist for financial management;Direct supervision/assist for medications management;Supervision due to cognitive status   Can travel by private vehicle        Equipment Recommendations Other (comment) (TBD at next venue of care)  Recommendations for  Other Services       Functional Status Assessment Patient has had a recent decline in their functional status and demonstrates the ability to make significant improvements in function in a reasonable and predictable amount of time.     Precautions / Restrictions Precautions Precautions: Fall Restrictions Weight Bearing Restrictions: No      Mobility  Bed Mobility Overal bed mobility: Needs Assistance Bed Mobility: Supine to Sit, Sit to Supine     Supine to sit: Min assist, Mod assist, +2 for safety/equipment Sit to supine: Min assist, +2 for safety/equipment (minA for trunk control/safety)        Transfers Overall transfer level: Needs assistance Equipment used: Rolling walker (2 wheels) Transfers: Sit to/from Stand Sit to Stand: Min assist, +2 safety/equipment           General transfer comment: required frequent redirection/multimodal cueing to initiate transfer    Ambulation/Gait               General Gait Details: unable at this time due to cognitive state  Stairs            Wheelchair Mobility     Tilt Bed    Modified Rankin (Stroke Patients Only)       Balance Overall balance assessment: Needs assistance Sitting-balance support: Bilateral upper extremity supported, Feet supported, Feet unsupported Sitting balance-Leahy Scale: Fair     Standing balance support: Bilateral upper extremity supported, During functional activity, Reliant on assistive device for balance Standing balance-Leahy Scale: Fair                               Pertinent Vitals/Pain Pain Assessment Pain Assessment: Faces Faces Pain Scale: Hurts whole lot Pain Location: headache that increases with mobility  Pain Descriptors / Indicators: Headache, Discomfort Pain Intervention(s): Limited activity within patient's tolerance, Monitored during session    Home Living Family/patient expects to be discharged to:: Private residence Living Arrangements:  Spouse/significant other Available Help at Discharge: Family;Available 24 hours/day Type of Home: House Home Access: Stairs to enter Entrance Stairs-Rails: Can reach both Entrance Stairs-Number of Steps: 6   Home Layout: Two level;Able to live on main level with bedroom/bathroom;Full bath on main level Home Equipment: BSC/3in1;Shower seat;Standard Walker;Rollator (4 wheels);Wheelchair - manual Additional Comments: 3 falls in the last 6 months    Prior Function Prior Level of Function : Needs assist       Physical Assist : ADLs (physical)   ADLs (physical): Bathing;Dressing;IADLs Mobility Comments: uses WC occasionally, uses rollator ADLs Comments: assistance for showering, dressing, husband performs IADLs     Extremity/Trunk Assessment   Upper Extremity Assessment Upper Extremity Assessment: Generalized weakness;Difficult to assess due to impaired cognition    Lower Extremity Assessment Lower Extremity Assessment: Difficult to assess due to impaired cognition;Generalized weakness       Communication   Communication Communication: Difficulty following commands/understanding Following commands: Follows one step commands inconsistently Cueing Techniques: Verbal cues;Gestural cues;Tactile cues;Visual cues  Cognition Arousal: Alert Behavior During Therapy: Restless Overall Cognitive Status: Impaired/Different from baseline Area of Impairment: Memory                     Memory: Decreased short-term memory         General Comments: Oriented to self        General Comments General comments (skin integrity, edema, etc.): Vitals in sitting: BP 141/90 (108) HR 103, attempted standing but unable to obtain accurate reading, supine at end of session BP 133/59(79) HR 95    Exercises     Assessment/Plan    PT Assessment Patient needs continued PT services  PT Problem List Decreased strength;Decreased activity tolerance;Decreased balance;Decreased  mobility;Decreased cognition;Decreased knowledge of use of DME;Decreased safety awareness       PT Treatment Interventions DME instruction;Gait training;Stair training;Functional mobility training;Therapeutic activities;Therapeutic exercise;Balance training;Patient/family education    PT Goals (Current goals can be found in the Care Plan section)  Acute Rehab PT Goals Patient Stated Goal: get better PT Goal Formulation: With patient Time For Goal Achievement: 12/16/22 Potential to Achieve Goals: Good    Frequency Min 1X/week     Co-evaluation PT/OT/SLP Co-Evaluation/Treatment: Yes Reason for Co-Treatment: Complexity of the patient's impairments (multi-system involvement);Necessary to address cognition/behavior during functional activity;For patient/therapist safety;To address functional/ADL transfers PT goals addressed during session: Mobility/safety with mobility OT goals addressed during session: ADL's and self-care       AM-PAC PT "6 Clicks" Mobility  Outcome Measure Help needed turning from your back to your side while in a flat bed without using bedrails?: A Little Help needed moving from lying on your back to sitting on the side of a flat bed without using bedrails?: A Little Help needed moving to and from a bed to a chair (including a wheelchair)?: A Lot Help needed standing up from a chair using your arms (e.g., wheelchair or bedside chair)?: A Little Help needed to walk in hospital room?: A Lot Help needed climbing 3-5 steps with a railing? : Total 6 Click Score: 14    End of Session Equipment Utilized During Treatment: Gait belt Activity Tolerance: Other (comment) (Limited due to confusion/congnitive state)     PT Visit Diagnosis: Other abnormalities of gait and mobility (R26.89);Muscle weakness (generalized) (M62.81);History of falling (  Z91.81);Difficulty in walking, not elsewhere classified (R26.2)    Time: 1610-9604 PT Time Calculation (min) (ACUTE ONLY): 34  min   Charges:   PT Evaluation $PT Eval Moderate Complexity: 1 Mod PT Treatments $Therapeutic Activity: 8-22 mins PT General Charges $$ ACUTE PT VISIT: 1 Visit        Lyliana Dicenso, PT, SPT 12:43 PM,12/02/22

## 2022-12-03 DIAGNOSIS — D332 Benign neoplasm of brain, unspecified: Secondary | ICD-10-CM | POA: Diagnosis not present

## 2022-12-03 LAB — BASIC METABOLIC PANEL
Anion gap: 12 (ref 5–15)
BUN: 26 mg/dL — ABNORMAL HIGH (ref 8–23)
CO2: 25 mmol/L (ref 22–32)
Calcium: 9.1 mg/dL (ref 8.9–10.3)
Chloride: 102 mmol/L (ref 98–111)
Creatinine, Ser: 1.12 mg/dL — ABNORMAL HIGH (ref 0.44–1.00)
GFR, Estimated: 53 mL/min — ABNORMAL LOW (ref >60)
Glucose, Bld: 172 mg/dL — ABNORMAL HIGH (ref 70–99)
Potassium: 4.1 mmol/L (ref 3.5–5.1)
Sodium: 139 mmol/L (ref 135–145)

## 2022-12-03 LAB — CBC
HCT: 38.8 % (ref 36.0–46.0)
Hemoglobin: 12.5 g/dL (ref 12.0–15.0)
MCH: 28.7 pg (ref 26.0–34.0)
MCHC: 32.2 g/dL (ref 30.0–36.0)
MCV: 89.2 fL (ref 80.0–100.0)
Platelets: 228 10*3/uL (ref 150–400)
RBC: 4.35 MIL/uL (ref 3.87–5.11)
RDW: 14.6 % (ref 11.5–15.5)
WBC: 15.8 10*3/uL — ABNORMAL HIGH (ref 4.0–10.5)
nRBC: 0 % (ref 0.0–0.2)

## 2022-12-03 LAB — GLUCOSE, CAPILLARY
Glucose-Capillary: 158 mg/dL — ABNORMAL HIGH (ref 70–99)
Glucose-Capillary: 147 mg/dL — ABNORMAL HIGH (ref 70–99)
Glucose-Capillary: 97 mg/dL (ref 70–99)
Glucose-Capillary: 98 mg/dL (ref 70–99)

## 2022-12-03 NOTE — TOC Progression Note (Signed)
Transition of Care [facility]) - Progression Note    Patient Details  Name: Jacqueline Cole MRN: [id number] Date of Birth: 05-06-1950  Transition of Care [facility]) CM/SW Contact  Allena Katz, LCSW Phone Number: 12/03/2022, 3:59 PM  Clinical Narrative:    CIR to submit for auth for patient.            Expected Discharge Plan and Services                                               Social Determinants of Health (SDOH) Interventions SDOH Screenings   Food Insecurity: No Food Insecurity (12/01/2022)  Housing: Low Risk  (12/01/2022)  Transportation Needs: No Transportation Needs (12/01/2022)  Utilities: Not At Risk (12/01/2022)  Depression (PHQ2-9): High Risk (09/22/2022)  Financial Resource Strain: Low Risk  (04/03/2020)  Tobacco Use: Medium Risk (12/01/2022)    Readmission Risk Interventions     No data to display

## 2022-12-03 NOTE — Progress Notes (Signed)
Neurosurgery Progress Note  History: JAKEYA Cole is s/p right craniotomy for tumor resection  POD2: Pt was disoriented and combative overnight receiving haldol. Husband at bedside reports improvement back to baseline this morning. Pt denying headache POD1: Pt complaining of headache otherwise at baseline per husband at bedside  Physical Exam: Vitals:   12/03/22 0500 12/03/22 0600  BP: (!) 155/133 (!) 142/59  Pulse: 99 99  Resp: (!) 25 12  Temp:    SpO2: 96% 94%    AA Ox3.  CNI No drift or dysmetria Strength:5/5 throughout  Incision covered with clean post-op dressing   Data:  Other tests/results:  Path pending   Assessment/Plan:  Jacqueline Cole is a 72 y.o s/p right craniotomy for tumor resection.   - mobilize - DVT prophylaxis - will continue to monitor blood glucose. SSI ordered - steroids stopped due to confusion and hyperglycemia  - will transfer to floor - will keep post-op dressing in place and remove on day of discharge. - PTOT  Manning Charity PA-C Department of Neurosurgery

## 2022-12-03 NOTE — PMR Pre-admission (Incomplete)
PMR Admission Coordinator Pre-Admission Assessment  Patient: Jacqueline Cole is an 72 y.o., female MRN: [id number] DOB: October 19, 1950 Height: 5\' 5"  (165.1 cm) Weight: 96.7 kg  Insurance Information HMO: yes    PPO:      PCP:      IPA: ***     80/20:      OTHER:  PRIMARY: [employer]      Policy#: [id number]      Subscriber: patient CM Name: ***      Phone#: ***     Fax#: *** Pre-Cert#: ***      Employer: *** Benefits:  Phone #: ***     Name: *** Dolores Hoose. Date: ***     Deduct: ***      Out of Pocket Max: ***      Life Max: *** CIR: ***      SNF: *** Outpatient: ***     Co-Pay: *** Home Health: ***      Co-Pay: *** DME: ***     Co-Pay: *** Providers: in-network SECONDARY:       Policy#:      Phone#:   Financial Counselor:       Phone#:   The Data processing manager" for patients in Inpatient Rehabilitation Facilities with attached "Privacy Act Statement-Health Care Records" was provided and verbally reviewed with: {CHL IP Patient Family XH:371696789}  Emergency Contact Information Contact Information     Name Relation Home Work Mobile   Chical Spouse   [phone]      Other Contacts     Name Relation Home Work Mobile   Melton,Karri Daughter   [phone]       Current Medical History  Patient Admitting Diagnosis: brain tumor s/p craniotomy for tumor excision History of Present Illness: Pt is a 72 year old female with medical hx significant for: meningioma, DM II, CKD IIIB, osteopenia, hearing loss. Pt presented to [facility] on 12/01/22 for right frontal craniotomy for tumor resection by Dr. Myer Haff. Therapy evaluations completed and CIR recommended d/t pt's deficits in functional mobility and cognition.    Patient's medical record from [facility] has been reviewed by the rehabilitation admission coordinator and physician.  Past Medical History  Past Medical History:  Diagnosis Date   Brain tumor (benign)  (HCC)    Chronic kidney disease    stage 3   Depression    Diabetes mellitus type II    Headache    brain tumor   Hyperlipemia    Major depressive disorder, recurrent, in remission (HCC) 09/01/2006   Qualifier: Diagnosis of  By: [name]     Multiple fibroadenomata of both breasts    Obesity    Osteopenia    PAD (peripheral artery disease) (HCC)    on screening   Syncope 1998   DM diagnosis    Has the patient had major surgery during 100 days prior to admission? Yes  Family History   family history includes Breast cancer (age of onset: 47) in her sister; Cancer in her brother and sister; Heart disease in her brother and father; Stroke in her father.  Current Medications  Current Facility-Administered Medications:    0.9 %  sodium chloride infusion, , Intravenous, Continuous, Venetia Night, MD, Stopped at 12/03/22 [phone]   acetaminophen (TYLENOL) tablet 650 mg, 650 mg, Oral, Q4H PRN, 650 mg at 12/02/22 0510 **OR** acetaminophen (TYLENOL) suppository 650 mg, 650 mg, Rectal, Q4H PRN, Venetia Night, MD   bisacodyl (DULCOLAX) suppository 10 mg,  10 mg, Rectal, Daily PRN, Venetia Night, MD   Chlorhexidine Gluconate Cloth 2 % PADS 6 each, 6 each, Topical, Daily, Venetia Night, MD, 6 each at 12/02/22 1128   diazepam (VALIUM) tablet 2 mg, 2 mg, Oral, Q8H PRN, Venetia Night, MD, 2 mg at 12/02/22 2143   docusate sodium (COLACE) capsule 100 mg, 100 mg, Oral, BID, Venetia Night, MD, 100 mg at 12/03/22 1206   empagliflozin (JARDIANCE) tablet 25 mg, 25 mg, Oral, QAC breakfast, Venetia Night, MD, 25 mg at 12/03/22 1207   enoxaparin (LOVENOX) injection 40 mg, 40 mg, Subcutaneous, Q24H, Venetia Night, MD, 40 mg at 12/03/22 1208   gabapentin (NEURONTIN) capsule 600 mg, 600 mg, Oral, QHS, Venetia Night, MD, 600 mg at 12/02/22 2108   glipiZIDE (GLUCOTROL XL) 24 hr tablet 2.5 mg, 2.5 mg, Oral, Q breakfast, Venetia Night, MD, 2.5 mg at 12/03/22  1207   haloperidol lactate (HALDOL) injection 2 mg, 2 mg, Intravenous, Q6H PRN, Venetia Night, MD, 2 mg at 12/02/22 1950   insulin aspart (novoLOG) injection 0-15 Units, 0-15 Units, Subcutaneous, TID WC, Venetia Night, MD, 2 Units at 12/03/22 1207   insulin aspart (novoLOG) injection 0-5 Units, 0-5 Units, Subcutaneous, QHS, Venetia Night, MD, 2 Units at 12/02/22 2112   insulin glargine-yfgn (SEMGLEE) injection 20 Units, 20 Units, Subcutaneous, BID, Venetia Night, MD, 20 Units at 12/03/22 1208   labetalol (NORMODYNE) injection 10-40 mg, 10-40 mg, Intravenous, Q10 min PRN, Venetia Night, MD   magnesium citrate solution 1 Bottle, 1 Bottle, Oral, Once PRN, Venetia Night, MD   naloxone [facility]) injection 0.08 mg, 0.08 mg, Intravenous, PRN, Venetia Night, MD   ondansetron [facility]) tablet 4 mg, 4 mg, Oral, Q4H PRN **OR** ondansetron (ZOFRAN) injection 4 mg, 4 mg, Intravenous, Q4H PRN, Venetia Night, MD, 4 mg at 12/02/22 1155   polyethylene glycol (MIRALAX / GLYCOLAX) packet 17 g, 17 g, Oral, Daily PRN, Venetia Night, MD   promethazine (PHENERGAN) tablet 12.5-25 mg, 12.5-25 mg, Oral, Q4H PRN, Venetia Night, MD   senna (SENOKOT) tablet 8.6 mg, 1 tablet, Oral, BID, Venetia Night, MD, 8.6 mg at 12/03/22 1206   venlafaxine XR (EFFEXOR-XR) 24 hr capsule 150 mg, 150 mg, Oral, QHS, Venetia Night, MD, 150 mg at 12/02/22 2109  Patients Current Diet:  Diet Order             Diet Carb Modified Fluid consistency: Thin; Room service appropriate? Yes  Diet effective now                   Precautions / Restrictions Precautions Precautions: Fall Restrictions Weight Bearing Restrictions: No   Has the patient had 2 or more falls or a fall with injury in the past year? {Yes/No/Unknown:304600602}  Prior Activity Level Limited Community (1-2x/wk): gets out of house ~2 days/week  Prior Functional Level Self Care: Did the patient need help bathing,  dressing, using the toilet or eating? Independent  Indoor Mobility: Did the patient need assistance with walking from room to room (with or without device)? Independent  Stairs: Did the patient need assistance with internal or external stairs (with or without device)? Pt avoids steps per husband report  Functional Cognition: Did the patient need help planning regular tasks such as shopping or remembering to take medications? Needed some help  Patient Information Are you of Hispanic, Latino/a,or Spanish origin?: A. No, not of Hispanic, Latino/a, or Spanish origin What is your race?: A. White Do you need or want an interpreter to communicate with a doctor or  health care staff?: 0. No  Patient's Response To:  Health Literacy and Transportation Is the patient able to respond to health literacy and transportation needs?: Yes Health Literacy - How often do you need to have someone help you when you read instructions, pamphlets, or other written material from your doctor or pharmacy?: Sometimes In the past 12 months, has lack of transportation kept you from medical appointments or from getting medications?: No In the past 12 months, has lack of transportation kept you from meetings, work, or from getting things needed for daily living?: No  Journalist, newspaper / Equipment Home Assistive Devices/Equipment: Dentures (specify type), Eyeglasses, Raised toilet seat with rails, Shower chair with back, Environmental consultant (specify type), Wheelchair Home Equipment: BSC/3in1, Information systems manager, Firefighter, Rollator (4 wheels), Wheelchair - manual  Prior Device Use: Indicate devices/aids used by the patient prior to current illness, exacerbation or injury? Walker  Current Functional Level Cognition  Overall Cognitive Status: Impaired/Different from baseline Orientation Level: Oriented to person, Disoriented to place, Disoriented to time, Disoriented to situation Following Commands: Follows one step commands  consistently Safety/Judgement: Decreased awareness of deficits, Decreased awareness of safety General Comments: oriented to self "hospital", husband in room. with cues able to mention surgery. disoriented to time and displayed some situational confusion throughout session    Extremity Assessment (includes Sensation/Coordination)  Upper Extremity Assessment: Generalized weakness, Difficult to assess due to impaired cognition  Lower Extremity Assessment: Generalized weakness, Difficult to assess due to impaired cognition    ADLs  Overall ADL's : Needs assistance/impaired Lower Body Dressing: Cueing for safety, Cueing for sequencing, Sit to/from stand, Moderate assistance Lower Body Dressing Details (indicate cue type and reason): Pt able to doff/don mesh underwear with MOD A to thread over feet. Toilet Transfer: Contact guard assist, Minimal assistance, Rolling walker (2 wheels), +2 for safety/equipment Toilet Transfer Details (indicate cue type and reason): Simulated to chair. Toileting- Clothing Manipulation and Hygiene: Moderate assistance, Sit to/from stand, Cueing for safety, Cueing for sequencing Toileting - Clothing Manipulation Details (indicate cue type and reason): Pt requires MOD A for thoroughness when completing peri-hygiene from STS Functional mobility during ADLs: Minimal assistance, Cueing for safety, Rolling walker (2 wheels), Cueing for sequencing, +2 for physical assistance General ADL Comments: requires multimodal cues to attend to tasks and redirect 2/2 cognition. Consistent cueing for sequencing required t/o any functional task attempted this date.    Mobility  Overal bed mobility: Needs Assistance Bed Mobility: Supine to Sit Supine to sit: Supervision Sit to supine: Min assist, +2 for safety/equipment General bed mobility comments: cues for safety but no true physical assistance needed    Transfers  Overall transfer level: Needs assistance Equipment used: Rolling  walker (2 wheels), None Transfers: Sit to/from Stand Sit to Stand: Contact guard assist, +2 safety/equipment General transfer comment: did impulsively stand several times without AD, CGA for steadying as needed. x1 instance of L lateral LOB during positional change requires MOD A +2 to safely maintain balance.    Ambulation / Gait / Stairs / Wheelchair Mobility  Ambulation/Gait Ambulation/Gait assistance: Contact guard assist, Mod assist, +2 safety/equipment Gait Distance (Feet): 80 Feet Assistive device: Rolling walker (2 wheels) General Gait Details: 1-2 LOB needing min-modA to correct. displayed a decreased safety awareness while ambulating as well (lifted walker to turn) Gait velocity: decreased    Posture / Balance Balance Overall balance assessment: Needs assistance Sitting-balance support: Bilateral upper extremity supported, Feet supported, Feet unsupported Sitting balance-Leahy Scale: Good Standing balance support: Bilateral upper  extremity supported, During functional activity, Reliant on assistive device for balance Standing balance-Leahy Scale: Fair Standing balance comment: able to statically stand without BUE, but required support with dynamic standing/ambulation to maximize safety    Special needs/care consideration Continuous Drip IV  0.9% sodium chloride infusion, Skin Surgical incision: head, Abrasion: arm, leg/bilateral; Erythema/Redness: abdomen, buttocks/bilateral; Ecchymosis: arm/bilateral, and Diabetic management Novolog 0-15 units 3x daily with meals; Novolog 0-5 units daily at bedtime; Semglee 20 units 2x daily   Previous Home Environment (from acute therapy documentation) Living Arrangements: Spouse/significant other  Lives With: Significant other Available Help at Discharge: Family, Available 24 hours/day Type of Home: House Home Layout: Able to live on main level with bedroom/bathroom Home Access: Stairs to enter Entrance Stairs-Rails: Right, Left, Can reach  both Entrance Stairs-Number of Steps: 6 Bathroom Shower/Tub: Health visitor: Handicapped height Bathroom Accessibility: Yes How Accessible: Accessible via walker Home Care Services: No Additional Comments: 3 falls in the last 6 months  Discharge Living Setting Plans for Discharge Living Setting: Patient's home Type of Home at Discharge: House Discharge Home Layout: Able to live on main level with bedroom/bathroom Discharge Home Access: Stairs to enter Entrance Stairs-Rails: Right, Left, Can reach both Entrance Stairs-Number of Steps: 6 Discharge Bathroom Shower/Tub: Walk-in shower Discharge Bathroom Toilet: Handicapped height Discharge Bathroom Accessibility: Yes How Accessible: Accessible via walker Does the patient have any problems obtaining your medications?: No  Social/Family/Support Systems Anticipated Caregiver: Nyalee Elmquist, husband Anticipated Caregiver's Contact Information: [phone] Caregiver Availability: 24/7 Discharge Plan Discussed with Primary Caregiver: Yes Is Caregiver In Agreement with Plan?: Yes Does Caregiver/Family have Issues with Lodging/Transportation while Pt is in Rehab?: No  Goals Patient/Family Goal for Rehab: *** Expected length of stay: *** Pt/Family Agrees to Admission and willing to participate: Yes Program Orientation Provided & Reviewed with Pt/Caregiver Including Roles  & Responsibilities: Yes  Decrease burden of Care through IP rehab admission: NA  Possible need for SNF placement upon discharge: Not anticipated  Patient Condition: I have reviewed medical records from [facility], spoken with CSW, and patient and spouse. I discussed via phone for inpatient rehabilitation assessment.  Patient will benefit from ongoing PT, OT, and SLP, can actively participate in 3 hours of therapy a day 5 days of the week, and can make measurable gains during the admission.  Patient will also benefit from the coordinated  team approach during an Inpatient Acute Rehabilitation admission.  The patient will receive intensive therapy as well as Rehabilitation physician, nursing, social worker, and care management interventions.  Due to safety, skin/wound care, disease management, medication administration, pain management, and patient education the patient requires 24 hour a day rehabilitation nursing.  The patient is currently *** with mobility and basic ADLs.  Discharge setting and therapy post discharge at home with home health is anticipated.  Patient has agreed to participate in the Acute Inpatient Rehabilitation Program and will admit {Time; today/tomorrow:10263}.  Preadmission Screen Completed By:  Domingo Pulse, 12/03/2022 1:15 PM ______________________________________________________________________   Discussed status with Dr. Marland Kitchen on *** at *** and received approval for admission today.  Admission Coordinator:  Domingo Pulse, CCC-SLP, time ***/Date ***   Assessment/Plan: Diagnosis: Does the need for close, 24 hr/day Medical supervision in concert with the patient's rehab needs make it unreasonable for this patient to be served in a less intensive setting? {yes_no_potentially:3041433} Co-Morbidities requiring supervision/potential complications: *** Due to {due UJ:8119147}, does the patient require 24 hr/day rehab nursing? {yes_no_potentially:3041433} Does the patient  require coordinated care of a physician, rehab nurse, PT, OT, and SLP to address physical and functional deficits in the context of the above medical diagnosis(es)? {yes_no_potentially:3041433} Addressing deficits in the following areas: {deficits:3041436} Can the patient actively participate in an intensive therapy program of at least 3 hrs of therapy 5 days a week? {yes_no_potentially:3041433} The potential for patient to make measurable gains while on inpatient rehab is {potential:3041437} Anticipated functional outcomes upon  discharge from inpatient rehab: {functional outcomes:304600100} PT, {functional outcomes:304600100} OT, {functional outcomes:304600100} SLP Estimated rehab length of stay to reach the above functional goals is: *** Anticipated discharge destination: {anticipated dc setting:21604} 10. Overall Rehab/Functional Prognosis: {potential:3041437}   MD Signature: ***

## 2022-12-03 NOTE — Progress Notes (Signed)
Occupational Therapy Treatment Patient Details Name: Jacqueline Cole MRN: [id number] DOB: 1951-01-19 Today's Date: 12/03/2022   History of present illness Pt is a 72 y/o female presenting s/p right frontal craniotomy for tumor excision. PMH includes memory deficits, PVD, CVA, T2D, HLD, obesity, and osteopenia.   OT comments  Jacqueline Cole was seen for OT/PT co-treatment on this date. Upon arrival to room pt supine in bed, pleasantly confused but agreeable to OT Tx session. Supportive spouse at bedside states cognition is closer to baseline this AM. OT facilitated ADL management with education and assistance as described below. See ADL section for additional details regarding occupational performance. Pt continues to be functionally limited by decreased cognition, generalized weakness, decreased safety awareness, and decreased awareness of deficits. Pt return verbalizes understanding of education provided t/o session. Pt is progressing toward OT goals and continues to benefit from skilled OT services to maximize return to PLOF and minimize risk of future falls, injury, caregiver burden, and readmission. Will continue to follow POC as written. Discharge recommendation remains appropriate.        If plan is discharge home, recommend the following:  A lot of help with walking and/or transfers;A lot of help with bathing/dressing/bathroom;Assistance with cooking/housework;Assist for transportation;Direct supervision/assist for medications management;Supervision due to cognitive status;Direct supervision/assist for financial management;Help with stairs or ramp for entrance   Equipment Recommendations  Other (comment) (defer)    Recommendations for Other Services      Precautions / Restrictions Precautions Precautions: Fall Restrictions Weight Bearing Restrictions: No       Mobility Bed Mobility Overal bed mobility: Needs Assistance Bed Mobility: Supine to Sit     Supine to sit: Supervision Sit  to supine: Min assist, +2 for safety/equipment   General bed mobility comments: cues for safety but no true physical assistance needed    Transfers Overall transfer level: Needs assistance Equipment used: Rolling walker (2 wheels), None Transfers: Sit to/from Stand Sit to Stand: Contact guard assist, +2 safety/equipment           General transfer comment: did impulsively stand several times without AD, CGA for steadying as needed. x1 instance of L lateral LOB during positional change requires MOD A +2 to safely maintain balance.     Balance Overall balance assessment: Needs assistance Sitting-balance support: Bilateral upper extremity supported, Feet supported, Feet unsupported Sitting balance-Leahy Scale: Good     Standing balance support: Bilateral upper extremity supported, During functional activity, Reliant on assistive device for balance Standing balance-Leahy Scale: Fair Standing balance comment: able to statically stand without BUE, but required support with dynamic standing/ambulation to maximize safety                           ADL either performed or assessed with clinical judgement   ADL Overall ADL's : Needs assistance/impaired                     Lower Body Dressing: Cueing for safety;Cueing for sequencing;Sit to/from stand;Moderate assistance Lower Body Dressing Details (indicate cue type and reason): Pt able to doff/don mesh underwear with MOD A to thread over feet. Toilet Transfer: Contact guard assist;Minimal assistance;Rolling walker (2 wheels);+2 for safety/equipment Toilet Transfer Details (indicate cue type and reason): Simulated to chair. Toileting- Clothing Manipulation and Hygiene: Moderate assistance;Sit to/from stand;Cueing for safety;Cueing for sequencing Toileting - Clothing Manipulation Details (indicate cue type and reason): Pt requires MOD A for thoroughness when completing peri-hygiene from STS  Functional mobility during  ADLs: Minimal assistance;Cueing for safety;Rolling walker (2 wheels);Cueing for sequencing;+2 for physical assistance General ADL Comments: requires multimodal cues to attend to tasks and redirect 2/2 cognition. Consistent cueing for sequencing required t/o any functional task attempted this date.    Extremity/Trunk Assessment              Vision       Perception     Praxis      Cognition Arousal: Alert Behavior During Therapy: WFL for tasks assessed/performed Overall Cognitive Status: Impaired/Different from baseline Area of Impairment: Memory, Safety/judgement                 Orientation Level: Disoriented to, Time, Situation   Memory: Decreased short-term memory Following Commands: Follows one step commands consistently Safety/Judgement: Decreased awareness of deficits, Decreased awareness of safety   Problem Solving: Requires verbal cues, Requires tactile cues General Comments: oriented to self "hospital", husband in room. with cues able to mention surgery. disoriented to time and displayed some situational confusion throughout session        Exercises Other Exercises Other Exercises: OT facilitated ADL management with education and assistance as described above. Pt/spouse educated on strategies to support pt regular sleep/wake cycles and minimize risk of hospital acquired delirium.    Shoulder Instructions       General Comments      Pertinent Vitals/ Pain       Pain Assessment Pain Assessment: No/denies pain  Home Living                                          Prior Functioning/Environment              Frequency  Min 1X/week        Progress Toward Goals  OT Goals(current goals can now be found in the care plan section)  Progress towards OT goals: Progressing toward goals  Acute Rehab OT Goals Patient Stated Goal: to go home OT Goal Formulation: With patient/family Time For Goal Achievement: 12/16/22 Potential to  Achieve Goals: Good  Plan      Co-evaluation    PT/OT/SLP Co-Evaluation/Treatment: Yes Reason for Co-Treatment: To address functional/ADL transfers PT goals addressed during session: Mobility/safety with mobility OT goals addressed during session: ADL's and self-care      AM-PAC OT "6 Clicks" Daily Activity     Outcome Measure   Help from another person eating meals?: None Help from another person taking care of personal grooming?: A Little Help from another person toileting, which includes using toliet, bedpan, or urinal?: A Little Help from another person bathing (including washing, rinsing, drying)?: A Lot Help from another person to put on and taking off regular upper body clothing?: A Little Help from another person to put on and taking off regular lower body clothing?: A Lot 6 Click Score: 17    End of Session Equipment Utilized During Treatment: Gait belt;Oxygen;Rolling walker (2 wheels)  OT Visit Diagnosis: Other abnormalities of gait and mobility (R26.89);Other symptoms and signs involving cognitive function;Muscle weakness (generalized) (M62.81)   Activity Tolerance Patient tolerated treatment well   Patient Left in bed;with call bell/phone within reach;with bed alarm set;with family/visitor present   Nurse Communication Mobility status        Time: 1610-9604 OT Time Calculation (min): 24 min  Charges: OT General Charges $OT Visit: 1 Visit OT Treatments $Self Care/Home  Management : 8-22 mins  Rockney Ghee, M.S., OTR/L 12/03/22, 12:14 PM

## 2022-12-03 NOTE — Progress Notes (Signed)
Physical Therapy Treatment Patient Details Name: Jacqueline Cole MRN: [id number] DOB: 05/26/1950 Today's Date: 12/03/2022   History of Present Illness Pt is a 72 y/o female presenting s/p right frontal craniotomy for tumor excision. PMH includes memory deficits, PVD, CVA, T2D, HLD, obesity, and osteopenia.    PT Comments  Overall the patient demonstrated improve ability to follow commands and mobility compared to previous session. Oriented to self, place, family in room but displayed intermittent confusion over situation/date, as well as decreased safety awareness and awareness of deficits. Supine to sit with supervision and verbal cues. Sit <> stand several times during session (impulsively, and for ADLs with OT) able to complete without AD but CGA required for intermittent steadying and safety cues. She was able to ambulate ~89ft with RW and CGAx2, but did experience 1-2 LOB that required min-modA to correct, pt seemed unaware. The patient would benefit from further skilled PT intervention (high intensity >3hr) to continue to progress towards goals and to maximize safety and function.     If plan is discharge home, recommend the following: A lot of help with walking and/or transfers;Assistance with cooking/housework;Assistance with feeding;Help with stairs or ramp for entrance;Assist for transportation;Direct supervision/assist for financial management;Direct supervision/assist for medications management;Supervision due to cognitive status;A little help with bathing/dressing/bathroom;A little help with walking and/or transfers   Can travel by private vehicle        Equipment Recommendations  Other (comment) (TBD at next venue of care)    Recommendations for Other Services       Precautions / Restrictions Precautions Precautions: Fall Restrictions Weight Bearing Restrictions: No     Mobility  Bed Mobility Overal bed mobility: Needs Assistance Bed Mobility: Supine to Sit     Supine  to sit: Supervision     General bed mobility comments: cues for safety but no true physical assistance needed    Transfers Overall transfer level: Needs assistance Equipment used: Rolling walker (2 wheels), None Transfers: Sit to/from Stand Sit to Stand: Contact guard assist           General transfer comment: did impulsively stand several times without AD, CGA for steadying as needed.    Ambulation/Gait Ambulation/Gait assistance: Contact guard assist, Mod assist, +2 safety/equipment Gait Distance (Feet): 80 Feet Assistive device: Rolling walker (2 wheels)   Gait velocity: decreased     General Gait Details: 1-2 LOB needing min-modA to correct. displayed a decreased safety awareness while ambulating as well (lifted walker to turn)   Stairs             Wheelchair Mobility     Tilt Bed    Modified Rankin (Stroke Patients Only)       Balance Overall balance assessment: Needs assistance Sitting-balance support: Bilateral upper extremity supported, Feet supported, Feet unsupported Sitting balance-Leahy Scale: Good     Standing balance support: Bilateral upper extremity supported, During functional activity, Reliant on assistive device for balance Standing balance-Leahy Scale: Fair Standing balance comment: able to statically stand without BUE, but required support with dynamic standing/ambulation to maximize safety                            Cognition Arousal: Alert Behavior During Therapy: WFL for tasks assessed/performed Overall Cognitive Status: History of cognitive impairments - at baseline Area of Impairment: Memory, Safety/judgement                 Orientation Level: Disoriented to, Time, Situation  Memory: Decreased short-term memory Following Commands: Follows one step commands consistently Safety/Judgement: Decreased awareness of deficits, Decreased awareness of safety   Problem Solving: Requires verbal cues, Requires  tactile cues General Comments: oriented to self "hospital", husband in room. with cues able to mention surgery. disoriented to time and displayed some situational confusion throughout session        Exercises      General Comments        Pertinent Vitals/Pain Pain Assessment Pain Assessment: No/denies pain    Home Living                          Prior Function            PT Goals (current goals can now be found in the care plan section) Progress towards PT goals: Progressing toward goals    Frequency    Min 1X/week      PT Plan      Co-evaluation PT/OT/SLP Co-Evaluation/Treatment: Yes Reason for Co-Treatment: To address functional/ADL transfers PT goals addressed during session: Mobility/safety with mobility OT goals addressed during session: ADL's and self-care      AM-PAC PT "6 Clicks" Mobility   Outcome Measure  Help needed turning from your back to your side while in a flat bed without using bedrails?: None Help needed moving from lying on your back to sitting on the side of a flat bed without using bedrails?: A Little Help needed moving to and from a bed to a chair (including a wheelchair)?: A Little Help needed standing up from a chair using your arms (e.g., wheelchair or bedside chair)?: A Little Help needed to walk in hospital room?: A Little Help needed climbing 3-5 steps with a railing? : A Lot 6 Click Score: 18    End of Session Equipment Utilized During Treatment: Gait belt Activity Tolerance: Patient tolerated treatment well Patient left: in chair;with call bell/phone within reach;with chair alarm set Nurse Communication: Mobility status PT Visit Diagnosis: Other abnormalities of gait and mobility (R26.89);Muscle weakness (generalized) (M62.81);History of falling (Z91.81);Difficulty in walking, not elsewhere classified (R26.2)     Time: 0937-1000 PT Time Calculation (min) (ACUTE ONLY): 23 min  Charges:    $Therapeutic  Activity: 8-22 mins PT General Charges $$ ACUTE PT VISIT: 1 Visit                     Olga Coaster PT, DPT 11:14 AM,12/03/22

## 2022-12-03 NOTE — Consult Note (Signed)
Physical Medicine and Rehabilitation Consult Reason for Consult: s/p right frontal craniotomy for tumor incision Referring Physician: Venetia Night, MD   HPI: Jacqueline Cole is a 72 y.o. female who presents s/p right frontal craniotomy for tumor excision. PMH inculdes memory deficits, PVD, CVA, T2D, HLD, obesity, and osteopenia. Physical Medicine & Rehabilitation was consulted to assess candidacy for CIR.     ROS decreased safety awareness as per therapy Past Medical History:  Diagnosis Date   Brain tumor (benign) (HCC)    Chronic kidney disease    stage 3   Depression    Diabetes mellitus type II    Headache    brain tumor   Hyperlipemia    Major depressive disorder, recurrent, in remission (HCC) 09/01/2006   Qualifier: Diagnosis of  By: [name]     Multiple fibroadenomata of both breasts    Obesity    Osteopenia    PAD (peripheral artery disease) (HCC)    on screening   Syncope 1998   DM diagnosis   Past Surgical History:  Procedure Laterality Date   ABDOMINAL HYSTERECTOMY  ~2005   and BSO   APPLICATION OF CRANIAL NAVIGATION N/A 12/01/2022   Procedure: APPLICATION OF CRANIAL NAVIGATION;  Surgeon: Venetia Night, MD;  Location: [facility] ORS;  Service: Neurosurgery;  Laterality: N/A;   BREAST BIOPSY Right 01/05/2019   rt stereo bx 2 areas 1st area ribbon clip   BREAST BIOPSY Right 01/05/2019   rt stereo bx 2nd area coil clip   CATARACT EXTRACTION W/ INTRAOCULAR LENS & ANTERIOR VITRECTOMY Bilateral 01/2022   CHOLECYSTECTOMY  1975   COSMETIC SURGERY     Injury Right face as a child   CRANIOTOMY N/A 12/01/2022   Procedure: RIGHT FRONTAL CRANIOTOMY FOR TUMOR EXCISION;  Surgeon: Venetia Night, MD;  Location: [facility] ORS;  Service: Neurosurgery;  Laterality: N/A;   ESOPHAGOGASTRODUODENOSCOPY (EGD) WITH PROPOFOL N/A 11/18/2020   Procedure: ESOPHAGOGASTRODUODENOSCOPY (EGD) WITH PROPOFOL;  Surgeon: Wyline Mood, MD;  Location: [facility] ENDOSCOPY;  Service:  Gastroenterology;  Laterality: N/A;   VAGINAL DELIVERY     X 2   Family History  Problem Relation Age of Onset   Stroke Father        CVA   Heart disease Father        MI   Cancer Sister        breast cancer   Breast cancer Sister 2   Heart disease Brother        MI   Cancer Brother        Lung   Social History:  reports that she quit smoking about 29 years ago. Her smoking use included cigarettes. She has never been exposed to tobacco smoke. She has never used smokeless tobacco. She reports that she does not drink alcohol and does not use drugs. Allergies:  Allergies  Allergen Reactions   Onglyza [Saxagliptin] Hives and Nausea And Vomiting   Semaglutide(0.25 Or 0.5mg -Dos) Nausea And Vomiting and Other (See Comments)    Extreme dehydration   Liraglutide Nausea Only    Can't eat, nausea, higher sugars   Tape Other (See Comments)    Thin skin    Medications Prior to Admission  Medication Sig Dispense Refill   Blood Glucose Monitoring Suppl (ONETOUCH VERIO FLEX SYSTEM) w/Device KIT Use to check blood sugar 2 times a day. 1 kit 0   Continuous Blood Gluc Sensor (FREESTYLE LIBRE 3 SENSOR) MISC PLACE 1 SENSOR ON THE SKIN EVERY 14 DAYS 2  each 11   gabapentin (NEURONTIN) 300 MG capsule TAKE TWO CAPSULES BY MOUTH EVERYDAY AT BEDTIME 180 capsule 3   glipiZIDE (GLUCOTROL XL) 2.5 MG 24 hr tablet Take 1 tablet (2.5 mg total) by mouth daily with breakfast. 90 tablet 3   glucose blood (ONETOUCH VERIO) test strip Use as instructed to check blood sugar 2 times a day. 200 each 12   hydrocortisone cream 1 % Apply 1 Application topically daily as needed for itching. 30 g 5   ibuprofen (ADVIL) 400 MG tablet Take 400 mg by mouth every 6 (six) hours as needed.     Ibuprofen-diphenhydrAMINE Cit (ADVIL PM PO) Take 1 tablet by mouth at bedtime as needed.     insulin glargine (LANTUS) 100 UNIT/ML injection Inject 0.35 mLs (35 Units total) into the skin 2 (two) times daily. (Patient taking differently:  Inject 35 Units into the skin 2 (two) times daily. 35 am , 30 pm) 20 mL 12   Lancets (ONETOUCH ULTRASOFT) lancets Use as instructed to check blood sugar 2 times a day. 200 each 12   triamcinolone cream (KENALOG) 0.1 % Apply TO THE affected area(s) TOPICALLY twice daily AS NEEDED 45 g 0   trolamine salicylate (ASPERCREME) 10 % cream Apply 1 Application topically as needed for muscle pain. Apply to feet 85 g 5   venlafaxine XR (EFFEXOR-XR) 150 MG 24 hr capsule TAKE ONE CAPSULE BY MOUTH EVERY MORNING and TAKE ONE CAPSULE BY MOUTH EVERYDAY AT BEDTIME 180 capsule 3   atorvastatin (LIPITOR) 20 MG tablet TAKE ONE TABLET BY MOUTH ONCE WEEKLY ON WEDNESDAY and TAKE ONE TABLET BY MOUTH ONCE WEEKLY ON SUNDAY 26 tablet 3   empagliflozin (JARDIANCE) 25 MG TABS tablet Take 1 tablet (25 mg total) by mouth daily before breakfast. 90 tablet 1   fluconazole (DIFLUCAN) 150 MG tablet 1 tablet once, repeat once as needed (Patient not taking: Reported on 11/24/2022) 1 tablet 1   FLUoxetine (PROZAC) 20 MG capsule Take 20 mg by mouth daily. (Patient not taking: Reported on 11/24/2022)      Home: Home Living Family/patient expects to be discharged to:: Private residence Living Arrangements: Spouse/significant other Available Help at Discharge: Family, Available 24 hours/day Type of Home: House Home Access: Stairs to enter [employer] of Steps: 6 Entrance Stairs-Rails: Right, Left, Can reach both Home Layout: Able to live on main level with bedroom/bathroom Bathroom Shower/Tub: Health visitor: Handicapped height Bathroom Accessibility: Yes Home Equipment: BSC/3in1, Information systems manager, Firefighter, Rollator (4 wheels), Wheelchair - manual Additional Comments: 3 falls in the last 6 months  Lives With: Significant other  Functional History: Prior Function Prior Level of Function : Needs assist Physical Assist : ADLs (physical) ADLs (physical): Bathing, Dressing, IADLs Mobility Comments: uses  WC occasionally, uses rollator ADLs Comments: assistance for showering, dressing, husband performs IADLs Functional Status:  Mobility: Bed Mobility Overal bed mobility: Needs Assistance Bed Mobility: Supine to Sit Supine to sit: Supervision Sit to supine: Min assist, +2 for safety/equipment General bed mobility comments: cues for safety but no true physical assistance needed Transfers Overall transfer level: Needs assistance Equipment used: Rolling walker (2 wheels), None Transfers: Sit to/from Stand Sit to Stand: Contact guard assist, +2 safety/equipment General transfer comment: did impulsively stand several times without AD, CGA for steadying as needed. x1 instance of L lateral LOB during positional change requires MOD A +2 to safely maintain balance. Ambulation/Gait Ambulation/Gait assistance: Contact guard assist, Mod assist, +2 safety/equipment Gait Distance (Feet): 80 Feet Assistive  device: Rolling walker (2 wheels) General Gait Details: 1-2 LOB needing min-modA to correct. displayed a decreased safety awareness while ambulating as well (lifted walker to turn) Gait velocity: decreased    ADL: ADL Overall ADL's : Needs assistance/impaired Lower Body Dressing: Cueing for safety, Cueing for sequencing, Sit to/from stand, Moderate assistance Lower Body Dressing Details (indicate cue type and reason): Pt able to doff/don mesh underwear with MOD A to thread over feet. Toilet Transfer: Contact guard assist, Minimal assistance, Rolling walker (2 wheels), +2 for safety/equipment Toilet Transfer Details (indicate cue type and reason): Simulated to chair. Toileting- Clothing Manipulation and Hygiene: Moderate assistance, Sit to/from stand, Cueing for safety, Cueing for sequencing Toileting - Clothing Manipulation Details (indicate cue type and reason): Pt requires MOD A for thoroughness when completing peri-hygiene from STS Functional mobility during ADLs: Minimal assistance, Cueing for  safety, Rolling walker (2 wheels), Cueing for sequencing, +2 for physical assistance General ADL Comments: requires multimodal cues to attend to tasks and redirect 2/2 cognition. Consistent cueing for sequencing required t/o any functional task attempted this date.  Cognition: Cognition Overall Cognitive Status: Impaired/Different from baseline Orientation Level: Oriented to person, Disoriented to place, Disoriented to time, Disoriented to situation Cognition Arousal: Alert Behavior During Therapy: WFL for tasks assessed/performed Overall Cognitive Status: Impaired/Different from baseline Area of Impairment: Memory, Safety/judgement Orientation Level: Disoriented to, Time, Situation Memory: Decreased short-term memory Following Commands: Follows one step commands consistently Safety/Judgement: Decreased awareness of deficits, Decreased awareness of safety Problem Solving: Requires verbal cues, Requires tactile cues General Comments: oriented to self "hospital", husband in room. with cues able to mention surgery. disoriented to time and displayed some situational confusion throughout session  Blood pressure 138/67, pulse 89, temperature 98.4 F (36.9 C), temperature source Oral, resp. rate 15, height 5\' 5"  (1.651 m), weight 96.7 kg, SpO2 97%. Physical Exam Gen: no distress, normal appearing HEENT: oral mucosa pink and moist, NCAT Cardio: Reg rate Chest: normal effort, normal rate of breathing Abd: soft, non-distended Ext: no edema Psych: pleasant, normal affect Skin: intact Neuro: Alert and oriented x2 (not date), 5/5 strength  Results for orders placed or performed during the hospital encounter of 12/01/22 (from the past 24 hour(s))  Glucose, capillary     Status: Abnormal   Collection Time: 12/02/22  4:16 PM  Result Value Ref Range   Glucose-Capillary 200 (H) 70 - 99 mg/dL  Glucose, capillary     Status: Abnormal   Collection Time: 12/02/22  9:06 PM  Result Value Ref Range    Glucose-Capillary 248 (H) 70 - 99 mg/dL  CBC     Status: Abnormal   Collection Time: 12/03/22  7:27 AM  Result Value Ref Range   WBC 15.8 (H) 4.0 - 10.5 K/uL   RBC 4.35 3.87 - 5.11 MIL/uL   Hemoglobin 12.5 12.0 - 15.0 g/dL   HCT 95.2 84.1 - 32.4 %   MCV 89.2 80.0 - 100.0 fL   MCH 28.7 26.0 - 34.0 pg   MCHC 32.2 30.0 - 36.0 g/dL   RDW 40.1 02.7 - 25.3 %   Platelets 228 150 - 400 K/uL   nRBC 0.0 0.0 - 0.2 %  Basic metabolic panel     Status: Abnormal   Collection Time: 12/03/22  7:27 AM  Result Value Ref Range   Sodium 139 135 - 145 mmol/L   Potassium 4.1 3.5 - 5.1 mmol/L   Chloride 102 98 - 111 mmol/L   CO2 25 22 - 32 mmol/L  Glucose, Bld 172 (H) 70 - 99 mg/dL   BUN 26 (H) 8 - 23 mg/dL   Creatinine, Ser 2.59 (H) 0.44 - 1.00 mg/dL   Calcium 9.1 8.9 - 56.3 mg/dL   GFR, Estimated 53 (L) >60 mL/min   Anion gap 12 5 - 15  Glucose, capillary     Status: Abnormal   Collection Time: 12/03/22  8:12 AM  Result Value Ref Range   Glucose-Capillary 158 (H) 70 - 99 mg/dL  Glucose, capillary     Status: Abnormal   Collection Time: 12/03/22 12:00 PM  Result Value Ref Range   Glucose-Capillary 147 (H) 70 - 99 mg/dL  Glucose, capillary     Status: None   Collection Time: 12/03/22  3:50 PM  Result Value Ref Range   Glucose-Capillary 97 70 - 99 mg/dL   No results found.  Assessment/Plan: Diagnosis: s/p right craniotomy for tumor resection Does the need for close, 24 hr/day medical supervision in concert with the patient's rehab needs make it unreasonable for this patient to be served in a less intensive setting? Yes Co-Morbidities requiring supervision/potential complications:   1) obesity: provide dietary education  2) malignant hypertension: continue to monitor HR TID  3) Tachypnea: continue to monitor respiratory rate TID  4) Type 2 DM: continue SSI  5) AKI Due to bladder management, bowel management, safety, skin/wound care, disease management, medication administration, pain  management, and patient education, does the patient require 24 hr/day rehab nursing? Yes Does the patient require coordinated care of a physician, rehab nurse, therapy disciplines of PT, OT, SLP to address physical and functional deficits in the context of the above medical diagnosis(es)? Yes Addressing deficits in the following areas: balance, endurance, locomotion, strength, transferring, bowel/bladder control, bathing, dressing, feeding, grooming, toileting, and cognition Can the patient actively participate in an intensive therapy program of at least 3 hrs of therapy per day at least 5 days per week? Yes The potential for patient to make measurable gains while on inpatient rehab is excellent Anticipated functional outcomes upon discharge from inpatient rehab are supervision  with PT, supervision with OT, supervision with SLP. Estimated rehab length of stay to reach the above functional goals is: 10-14 days Anticipated discharge destination: Home Overall Rehab/Functional Prognosis: excellent  POST ACUTE RECOMMENDATIONS: This patient's condition is appropriate for continued rehabilitative care in the following setting: CIR Patient has agreed to participate in recommended program. Potentially- her preference would be to go home but unfortunately she is not at the functional level needed to do so.  Note that insurance prior authorization may be required for reimbursement for recommended care.  I have personally performed a face to face diagnostic evaluation of this patient. Additionally, I have examined the patient's medical record including any pertinent labs and radiographic images. If the physician assistant has documented in this note, I have reviewed and edited or otherwise concur with the physician assistant's documentation.  Thanks,  Horton Chin, MD 12/03/2022

## 2022-12-03 NOTE — Progress Notes (Signed)
Inpatient Rehab Admissions:  Inpatient Rehab Consult received.  I spoke with patient and her husband, Mellody Dance, on the telephone for rehabilitation assessment and to discuss goals and expectations of an inpatient rehab admission.  Discussed average length of stay, insurance authorization, discharge home after completion of CIR. Both acknowledged understanding. Pt would like to pursue CIR and her husband is supportive. Mellody Dance confirmed that he will be able to provide 24/7 support for pt after discharge. Will continue to follow.  Signed: Wolfgang Phoenix, MS, CCC-SLP Admissions Coordinator [phone]

## 2022-12-03 NOTE — Discharge Summary (Signed)
Discharge Summary  Patient ID: ODESSA BAJEMA MRN: [id number] DOB/AGE: 11-11-1950 72 y.o.  Admit date: 12/01/2022 Discharge date: 12/07/2022  Admission Diagnoses: Brain tumor Discharge Diagnoses:  Principal Problem:   Brain tumor (benign) (HCC) Active Problems:   Brain tumor [facility])   Discharged Condition: good  Hospital Course:  Nylasia Bertolini is a 72 year old presenting with some trouble with her memory over the last 3 to 6 months and was found to have a right frontal mass on MRI.  She underwent a right craniotomy for tumor resection on 12/01/2022.  Her intraoperative course was uncomplicated and she was admitted to the ICU for monitoring.  She had some postoperative delirium on postop day 1 which resolved with medications and time.  She was initially seen and evaluated by therapy and deemed appropriate for discharge to inpatient rehab however over the course of her hospital stay, she improved and was deemed appropriate for discharge home with home health services on postop day 6.  She is not having any significant pain and was therefore discharged with her home medication regimen.  Consults: None  Significant Diagnostic Studies: none. Final pathology pending  Treatments: surgery: As above.  Please see separately dictated operative report for further details.  Discharge Exam: Blood pressure (!) 135/46, pulse 70, temperature 98.9 F (37.2 C), temperature source Oral, resp. rate 18, height 5\' 5"  (1.651 m), weight 103.8 kg, SpO2 95%. AA Ox3.  CNI No drift or dysmetria Incision c/d/i  Disposition: Discharge disposition: 01-Home or Self Care        Allergies as of 12/07/2022       Reactions   Onglyza [saxagliptin] Hives, Nausea And Vomiting   Semaglutide(0.25 Or 0.5mg -dos) Nausea And Vomiting, Other (See Comments)   Extreme dehydration   Liraglutide Nausea Only   Can't eat, nausea, higher sugars   Tape Other (See Comments)   Thin skin         Medication List     STOP  taking these medications    fluconazole 150 MG tablet Commonly known as: DIFLUCAN   FLUoxetine 20 MG capsule Commonly known as: PROZAC       TAKE these medications    ADVIL PM PO Take 1 tablet by mouth at bedtime as needed.   atorvastatin 20 MG tablet Commonly known as: LIPITOR TAKE ONE TABLET BY MOUTH ONCE WEEKLY ON WEDNESDAY and TAKE ONE TABLET BY MOUTH ONCE WEEKLY ON SUNDAY   empagliflozin 25 MG Tabs tablet Commonly known as: JARDIANCE Take 1 tablet (25 mg total) by mouth daily before breakfast.   FreeStyle Libre 3 Sensor Misc PLACE 1 SENSOR ON THE SKIN EVERY 14 DAYS   gabapentin 300 MG capsule Commonly known as: NEURONTIN TAKE TWO CAPSULES BY MOUTH EVERYDAY AT BEDTIME   glipiZIDE 2.5 MG 24 hr tablet Commonly known as: GLUCOTROL XL Take 1 tablet (2.5 mg total) by mouth daily with breakfast.   hydrocortisone cream 1 % Apply 1 Application topically daily as needed for itching.   ibuprofen 400 MG tablet Commonly known as: ADVIL Take 400 mg by mouth every 6 (six) hours as needed.   insulin glargine 100 UNIT/ML injection Commonly known as: Lantus Inject 0.35 mLs (35 Units total) into the skin 2 (two) times daily. What changed: additional instructions   onetouch ultrasoft lancets Use as instructed to check blood sugar 2 times a day.   OneTouch Verio Flex System w/Device Kit Use to check blood sugar 2 times a day.   OneTouch Verio test strip Generic drug: glucose  blood Use as instructed to check blood sugar 2 times a day.   triamcinolone cream 0.1 % Commonly known as: KENALOG Apply TO THE affected area(s) TOPICALLY twice daily AS NEEDED   trolamine salicylate 10 % cream Commonly known as: ASPERCREME Apply 1 Application topically as needed for muscle pain. Apply to feet   venlafaxine XR 150 MG 24 hr capsule Commonly known as: EFFEXOR-XR TAKE ONE CAPSULE BY MOUTH EVERY MORNING and TAKE ONE CAPSULE BY MOUTH EVERYDAY AT BEDTIME        Follow-up  Information     Karie Schwalbe, MD Follow up.   Specialties: Internal Medicine, Pediatrics Why: Hospital follow up Contact information: [address] [place] [address] [phone]                 Signed: Susanne Borders 12/07/2022, 11:31 AM

## 2022-12-03 NOTE — Discharge Instructions (Signed)
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 Increase protein intake to promote wound healing.  Recommended activity: activity as tolerated. No driving for 6 weeks.You should walk multiple times per day  Special Instructions  No straining, no heavy lifting > 10lbs x 4 weeks.  Keep incision area clean and dry. May shower in 2 days. No baths or pools for 6 weeks.  Please remove dressing tomorrow, no need to apply a bandage afterwards  You have sutures or staples that will be removed in clinic.   Please take pain medications as directed. Take a stool softener if on pain medications   Please Report any of the following: Nausea or Vomiting, Temperature is greater than 101.24F (38.1C) degrees, Dizziness, Abdominal Pain, Difficulty Breathing or Shortness of Breath, Inability to Eat, drink Fluids, or Take medications, Bleeding, swelling, or drainage from surgical incision sites, New numbness or weakness, and Bowel or bladder dysfunction to the neurosurgeon on call. How to contact us:  If you have any questions/concerns before or after surgery, you can reach [place] at [phone], or you can send a mychart message. We can be reached by phone or mychart 8am-4pm, Monday-Friday.  *Please note: Calls after 4pm are forwarded to a third party answering service. Mychart messages are not routinely monitored during evenings, weekends, and holidays. Please call our office to contact the answering service for urgent concerns during non-business hours.   Additional Follow up appointments Please follow up with Manning Charity PA-C as scheduled in 2-3 weeks   Please see below for scheduled appointments:  Future Appointments  Date Time Provider Department Center  12/16/2022  2:30 PM Susanne Borders, [place] [facility] None  01/13/2023   1:30 PM Venetia Night, MD [facility] None  01/25/2023 10:15 AM Karie Schwalbe, MD [facility]  01/26/2023  9:30 AM [facility] None  02/22/2023  1:30 PM Susanne Borders, PA [facility] None

## 2022-12-04 LAB — GLUCOSE, CAPILLARY
Glucose-Capillary: 80 mg/dL (ref 70–99)
Glucose-Capillary: 75 mg/dL (ref 70–99)
Glucose-Capillary: 71 mg/dL (ref 70–99)
Glucose-Capillary: 74 mg/dL (ref 70–99)

## 2022-12-04 NOTE — Progress Notes (Signed)
Patient arrived to unit in bed with husband at bedside.  Patient oriented to room and unit with occasional confusion.

## 2022-12-04 NOTE — Progress Notes (Signed)
Neurosurgery Progress Note   History: Jacqueline Cole is s/p right craniotomy for tumor resection  POD3:  Pt doing much better. Able to walk to bathroom.  PMR recommending inpatient rehab  POD2: Pt was disoriented and combative overnight receiving haldol. Husband at bedside reports improvement back to baseline this morning. Pt denying headache POD1: Pt complaining of headache otherwise at baseline per husband at bedside   Physical Exam: Vitals:   12/03/22 0500 12/03/22 0600 BP: (!) 155/133 (!) 142/59 Pulse: 99 99 Resp: (!) 25 12 Temp:     SpO2: 96% 94%     AA Ox3.  CNI No drift or dysmetria Strength:5/5 throughout  Incision covered with clean post-op dressing    Data:   Other tests/results:  Path pending    Assessment/Plan:   Jacqueline Cole is a 72 y.o s/p right craniotomy for tumor resection.    - mobilize - DVT prophylaxis - will continue to monitor blood glucose. SSI ordered - steroids stopped due to confusion and hyperglycemia  - will keep post-op dressing in place and remove on day of discharge. - PTOT - inpatient rehab     Karenann Cai MD, Phd

## 2022-12-05 LAB — GLUCOSE, CAPILLARY
Glucose-Capillary: 91 mg/dL (ref 70–99)
Glucose-Capillary: 128 mg/dL — ABNORMAL HIGH (ref 70–99)
Glucose-Capillary: 71 mg/dL (ref 70–99)
Glucose-Capillary: 127 mg/dL — ABNORMAL HIGH (ref 70–99)

## 2022-12-05 NOTE — Progress Notes (Signed)
Physical Therapy Treatment Patient Details Name: Jacqueline Cole MRN: [id number] DOB: [birth year] Today's Date: 12/05/2022   History of Present Illness Pt is a 72 y/o female presenting s/p right frontal craniotomy for tumor excision. PMH includes memory deficits, PVD, CVA, T2D, HLD, obesity, and osteopenia.    PT Comments  Pt making steady progress with functional mobility. She continues to demonstrate significant cognitive deficits, particularly with safety awareness and short-term memory difficulties. Pt attempted several time to have a BM during session without success. Pt would continue to benefit from skilled physical therapy services at this time while admitted and after d/c to address the below listed limitations in order to improve overall safety and independence with functional mobility.     If plan is discharge home, recommend the following: Assistance with cooking/housework;Assistance with feeding;Help with stairs or ramp for entrance;Assist for transportation;Direct supervision/assist for financial management;Direct supervision/assist for medications management;Supervision due to cognitive status;A little help with bathing/dressing/bathroom;A little help with walking and/or transfers   Can travel by private vehicle        Equipment Recommendations  Other (comment) (defer to next venue of care)    Recommendations for Other Services       Precautions / Restrictions Precautions Precautions: Fall Restrictions Weight Bearing Restrictions: No     Mobility  Bed Mobility               General bed mobility comments: pt OOB upon PT arrival    Transfers Overall transfer level: Needs assistance Equipment used: Rolling walker (2 wheels) Transfers: Sit to/from Stand Sit to Stand: Supervision           General transfer comment: pt able to complete sit<>stand transfers from toilet x3 with use of RW and hand rail in bathroom with supervision for safety     Ambulation/Gait Ambulation/Gait assistance: Contact guard assist Gait Distance (Feet): 75 Feet Assistive device: Rolling walker (2 wheels) Gait Pattern/deviations: Step-through pattern, Decreased stride length Gait velocity: decreased     General Gait Details: pt with mild instability with use of RW but no overt LOB or need for external physical assistance; VC'ing needed for safety and directional guidance   Stairs             Wheelchair Mobility     Tilt Bed    Modified Rankin (Stroke Patients Only)       Balance Overall balance assessment: Needs assistance Sitting-balance support: Feet supported Sitting balance-Leahy Scale: Good     Standing balance support: Bilateral upper extremity supported, During functional activity, Single extremity supported, No upper extremity supported Standing balance-Leahy Scale: Poor                              Cognition Arousal: Alert Behavior During Therapy: WFL for tasks assessed/performed Overall Cognitive Status: Impaired/Different from baseline Area of Impairment: Memory, Safety/judgement                     Memory: Decreased short-term memory   Safety/Judgement: Decreased awareness of safety, Decreased awareness of deficits              Exercises      General Comments        Pertinent Vitals/Pain Pain Assessment Pain Assessment: No/denies pain Pain Intervention(s): Monitored during session    Home Living  Prior Function            PT Goals (current goals can now be found in the care plan section) Acute Rehab PT Goals PT Goal Formulation: With patient Time For Goal Achievement: 12/16/22 Potential to Achieve Goals: Good Progress towards PT goals: Progressing toward goals    Frequency    Min 1X/week      PT Plan      Co-evaluation              AM-PAC PT "6 Clicks" Mobility   Outcome Measure  Help needed turning from your  back to your side while in a flat bed without using bedrails?: None Help needed moving from lying on your back to sitting on the side of a flat bed without using bedrails?: None Help needed moving to and from a bed to a chair (including a wheelchair)?: A Little Help needed standing up from a chair using your arms (e.g., wheelchair or bedside chair)?: None Help needed to walk in hospital room?: A Little Help needed climbing 3-5 steps with a railing? : A Lot 6 Click Score: 20    End of Session Equipment Utilized During Treatment: Gait belt Activity Tolerance: Patient tolerated treatment well Patient left: in chair;with call bell/phone within reach;with family/visitor present Nurse Communication: Mobility status PT Visit Diagnosis: Other abnormalities of gait and mobility (R26.89);Muscle weakness (generalized) (M62.81);History of falling (Z91.81);Difficulty in walking, not elsewhere classified (R26.2)     Time: 0272-5366 PT Time Calculation (min) (ACUTE ONLY): 41 min  Charges:    $Gait Training: 8-22 mins $Therapeutic Activity: 23-37 mins PT General Charges $$ ACUTE PT VISIT: 1 Visit                     Arletta Bale, DPT  Acute Rehabilitation Services Office [phone]    Alessandra Bevels Shervon Kerwin 12/05/2022, 11:45 AM

## 2022-12-05 NOTE — Plan of Care (Signed)
Pt's BS has been under 100 last day and a half.  Blood sugar tonight was 74.  Was concerned about giving 20u of semglee.  Reached out to Dr. Phillis Haggis and he agreed to hold.

## 2022-12-05 NOTE — Progress Notes (Signed)
Neurosurgery Progress Note   History: Jacqueline Cole is s/p right craniotomy for tumor resection  POD4:  Continues to show improvement.  PT still states that they are having cognitive deficits, particularly with safety awareness and short-term memory difficulties.  POD3:  Pt doing much better. Able to walk to bathroom.  PMR recommending inpatient rehab  POD2: Pt was disoriented and combative overnight receiving haldol. Husband at bedside reports improvement back to baseline this morning. Pt denying headache POD1: Pt complaining of headache otherwise at baseline per husband at bedside   Physical Exam:      AA Ox3.  CNI No drift or dysmetria Strength:5/5 throughout  Incision covered with clean post-op dressing    Data:   Other tests/results:  Path pending    Assessment/Plan:   Jacqueline Cole is a 72 y.o s/p right craniotomy for tumor resection.    - mobilize - DVT prophylaxis - will continue to monitor blood glucose. SSI ordered - steroids stopped due to confusion and hyperglycemia  - will keep post-op dressing in place and remove on day of discharge. - PTOT - inpatient rehab, may progress to home health.       Karenann Cai MD, Phd

## 2022-12-05 NOTE — PMR Pre-admission (Incomplete)
PMR Admission Coordinator Pre-Admission Assessment  Patient: Jacqueline Cole is an 72 y.o., female MRN: [id number] DOB: March 04, 1951 Height: 5\' 5"  (165.1 cm) Weight: 103.8 kg              Insurance Information HMO: yes    PPO:      PCP:      IPA:      80/20:      OTHER:  PRIMARY: [employer]      Policy#: [id number]      Subscriber: patient CM Name: ***      Phone#: ***     Fax#: [phone] Pre-Cert#: [id number]      Employer:  Benefits:  Phone #: n/a-online at [email or web address]     Name:  Eff. Date: 08/14/22     Deduct: does not have one      Out of Pocket Max: $3,600 ($521.24 met)      Life Max: NA  CIR: $295/day co-pay with a max co-pay of $2,065/admission      SNF: $20/day co-pay for days 1-20, $203/day co-pay for days 21-100 Outpatient: $25 co-pay/visit     Co-Pay:  Home Health: 100% coverage      Co-Pay:  DME: 80% coverage     Co-Pay: 20% co-insurance Providers: in-network SECONDARY:       Policy#:       Phone#:   Artist:       Phone#:   The Data processing manager" for patients in Inpatient Rehabilitation Facilities with attached "Privacy Act Statement-Health Care Records" was provided and verbally reviewed with: {CHL IP Patient Family DG:[id number]}  Emergency Contact Information Contact Information     Name Relation Home Work Mobile   [place] Spouse   [phone]      Other Contacts     Name Relation Home Work Mobile   [place],Karri Daughter   [phone]      Current Medical History  Patient Admitting Diagnosis: brain tumor s/p craniotomy for tumor excision History of Present Illness: Pt is a 72 year old female with medical hx significant for : meningioma, DM II, CKD IIIB, osteopenia, hearing loss. Pt [facility] to [facility] on 12/01/22 for right frontal craniotomy for tumor resection by Dr. Myer Haff. Therapy evaluations completed and CIR recommended d/t pt's deficits in functional mobility and cognition.     Glasgow Coma Scale Score: 15  Patient's medical record from [facility] has been reviewed by the rehabilitation admission coordinator and physician.  Past Medical History  Past Medical History:  Diagnosis Date   Brain tumor (benign) (HCC)    Chronic kidney disease    stage 3   Depression    Diabetes mellitus type II    Headache    brain tumor   Hyperlipemia    Major depressive disorder, recurrent, in remission (HCC) 09/01/2006   Qualifier: Diagnosis of  By: [name]     Multiple fibroadenomata of both breasts    Obesity    Osteopenia    PAD (peripheral artery disease) (HCC)    on screening   Syncope 1998   DM diagnosis    Has the patient had major surgery during 100 days prior to admission? Yes  Family History  family history includes Breast cancer (age of onset: 33) in her sister; Cancer in her brother and sister; Heart disease in her brother and father; Stroke in her father.   Current Medications   Current Facility-Administered Medications:    0.9 %  sodium chloride infusion, ,  Intravenous, Continuous, Venetia Night, MD, Stopped at 12/03/22 [phone]   acetaminophen (TYLENOL) tablet 650 mg, 650 mg, Oral, Q4H PRN, 650 mg at 12/04/22 1212 **OR** acetaminophen (TYLENOL) suppository 650 mg, 650 mg, Rectal, Q4H PRN, Venetia Night, MD   bisacodyl (DULCOLAX) suppository 10 mg, 10 mg, Rectal, Daily PRN, Venetia Night, MD   diazepam (VALIUM) tablet 2 mg, 2 mg, Oral, Q8H PRN, Venetia Night, MD, 2 mg at 12/02/22 2143   docusate sodium (COLACE) capsule 100 mg, 100 mg, Oral, BID, Venetia Night, MD, 100 mg at 12/05/22 0914   empagliflozin (JARDIANCE) tablet 25 mg, 25 mg, Oral, QAC breakfast, Venetia Night, MD, 25 mg at 12/05/22 0915   enoxaparin (LOVENOX) injection 40 mg, 40 mg, Subcutaneous, Q24H, Venetia Night, MD, 40 mg at 12/05/22 0916   gabapentin (NEURONTIN) capsule 600 mg, 600 mg, Oral, QHS, Venetia Night, MD, 600  mg at 12/04/22 2038   glipiZIDE (GLUCOTROL XL) 24 hr tablet 2.5 mg, 2.5 mg, Oral, Q breakfast, Venetia Night, MD, 2.5 mg at 12/05/22 0915   haloperidol lactate (HALDOL) injection 2 mg, 2 mg, Intravenous, Q6H PRN, Venetia Night, MD, 2 mg at 12/02/22 1950   insulin aspart (novoLOG) injection 0-15 Units, 0-15 Units, Subcutaneous, TID WC, Venetia Night, MD, 2 Units at 12/05/22 1352   insulin aspart (novoLOG) injection 0-5 Units, 0-5 Units, Subcutaneous, QHS, Venetia Night, MD, 2 Units at 12/02/22 2112   insulin glargine-yfgn (SEMGLEE) injection 20 Units, 20 Units, Subcutaneous, BID, Venetia Night, MD, 20 Units at 12/05/22 0915   labetalol (NORMODYNE) injection 10-40 mg, 10-40 mg, Intravenous, Q10 min PRN, Venetia Night, MD   magnesium citrate solution 1 Bottle, 1 Bottle, Oral, Once PRN, Venetia Night, MD   naloxone [facility]) injection 0.08 mg, 0.08 mg, Intravenous, PRN, Venetia Night, MD   ondansetron [facility]) tablet 4 mg, 4 mg, Oral, Q4H PRN **OR** ondansetron (ZOFRAN) injection 4 mg, 4 mg, Intravenous, Q4H PRN, Venetia Night, MD, 4 mg at 12/02/22 1155   polyethylene glycol (MIRALAX / GLYCOLAX) packet 17 g, 17 g, Oral, Daily PRN, Venetia Night, MD   promethazine (PHENERGAN) tablet 12.5-25 mg, 12.5-25 mg, Oral, Q4H PRN, Venetia Night, MD   senna (SENOKOT) tablet 8.6 mg, 1 tablet, Oral, BID, Venetia Night, MD, 8.6 mg at 12/05/22 0914   venlafaxine XR (EFFEXOR-XR) 24 hr capsule 150 mg, 150 mg, Oral, QHS, Venetia Night, MD, 150 mg at 12/04/22 2038  Patients Current Diet:  Diet Order             Diet Carb Modified Fluid consistency: Thin; Room service appropriate? Yes  Diet effective now                   Precautions / Restrictions Precautions Precautions: Fall Restrictions Weight Bearing Restrictions: No   Has the patient had 2 or more falls or a fall with injury in the past year?No  Prior Activity Level Limited Community  (1-2x/wk): gets out of house ~2 days/week  Prior Functional Level Prior Function Prior Level of Function : Needs assist Physical Assist : ADLs (physical) ADLs (physical): Bathing, Dressing, IADLs Mobility Comments: uses WC occasionally, uses rollator ADLs Comments: assistance for showering, dressing, husband performs IADLs  Self Care: Did the patient need help bathing, dressing, using the toilet or eating?  Independent  Indoor Mobility: Did the patient need assistance with walking from room to room (with or without device)? Independent  Stairs: Did the patient need assistance with internal or external stairs (with or without device)? Pt avoids steps per husband  report  Functional Cognition: Did the patient need help planning regular tasks such as shopping or remembering to take medications? Needed some help  Patient Information Are you of Hispanic, Latino/a,or Spanish origin?: A. No, not of Hispanic, Latino/a, or Spanish origin What is your race?: A. White Do you need or want an interpreter to communicate with a doctor or health care staff?: 0. No  Patient's Response To:  Health Literacy and Transportation Is the patient able to respond to health literacy and transportation needs?: Yes Health Literacy - How often do you need to have someone help you when you read instructions, pamphlets, or other written material from your doctor or pharmacy?: Sometimes In the past 12 months, has lack of transportation kept you from medical appointments or from getting medications?: No In the past 12 months, has lack of transportation kept you from meetings, work, or from getting things needed for daily living?: No  Journalist, newspaper / Equipment Home Assistive Devices/Equipment: Dentures (specify type), Eyeglasses, Raised toilet seat with rails, Shower chair with back, Environmental consultant (specify type), Wheelchair Home Equipment: BSC/3in1, Information systems manager, Firefighter, Rollator (4 wheels), Wheelchair -  manual  Prior Device Use: Indicate devices/aids used by the patient prior to current illness, exacerbation or injury? Walker  Current Functional Level Cognition  Overall Cognitive Status: Impaired/Different from baseline Orientation Level: Oriented X4 Following Commands: Follows one step commands consistently Safety/Judgement: Decreased awareness of safety, Decreased awareness of deficits General Comments: oriented to self "hospital", husband in room. with cues able to mention surgery. disoriented to time and displayed some situational confusion throughout session    Extremity Assessment (includes Sensation/Coordination)  Upper Extremity Assessment: Generalized weakness, Difficult to assess due to impaired cognition  Lower Extremity Assessment: Generalized weakness, Difficult to assess due to impaired cognition    ADLs  Overall ADL's : Needs assistance/impaired Lower Body Dressing: Cueing for safety, Cueing for sequencing, Sit to/from stand, Moderate assistance Lower Body Dressing Details (indicate cue type and reason): Pt able to doff/don mesh underwear with MOD A to thread over feet. Toilet Transfer: Contact guard assist, Minimal assistance, Rolling walker (2 wheels), +2 for safety/equipment Toilet Transfer Details (indicate cue type and reason): Simulated to chair. Toileting- Clothing Manipulation and Hygiene: Moderate assistance, Sit to/from stand, Cueing for safety, Cueing for sequencing Toileting - Clothing Manipulation Details (indicate cue type and reason): Pt requires MOD A for thoroughness when completing peri-hygiene from STS Functional mobility during ADLs: Minimal assistance, Cueing for safety, Rolling walker (2 wheels), Cueing for sequencing, +2 for physical assistance General ADL Comments: requires multimodal cues to attend to tasks and redirect 2/2 cognition. Consistent cueing for sequencing required t/o any functional task attempted this date.    Mobility  Overal bed  mobility: Needs Assistance Bed Mobility: Supine to Sit Supine to sit: Supervision Sit to supine: Min assist, +2 for safety/equipment General bed mobility comments: pt OOB upon PT arrival    Transfers  Overall transfer level: Needs assistance Equipment used: Rolling walker (2 wheels) Transfers: Sit to/from Stand Sit to Stand: Supervision General transfer comment: pt able to complete sit<>stand transfers from toilet x3 with use of RW and hand rail in bathroom with supervision for safety    Ambulation / Gait / Stairs / Wheelchair Mobility  Ambulation/Gait Ambulation/Gait assistance: Contact guard assist Gait Distance (Feet): 75 Feet Assistive device: Rolling walker (2 wheels) Gait Pattern/deviations: Step-through pattern, Decreased stride length General Gait Details: pt with mild instability with use of RW but no overt LOB or need for  external physical assistance; VC'ing needed for safety and directional guidance Gait velocity: decreased    Posture / Balance Balance Overall balance assessment: Needs assistance Sitting-balance support: Feet supported Sitting balance-Leahy Scale: Good Standing balance support: Bilateral upper extremity supported, During functional activity, Single extremity supported, No upper extremity supported Standing balance-Leahy Scale: Poor Standing balance comment: able to statically stand without BUE, but required support with dynamic standing/ambulation to maximize safety    Special needs/care consideration Continuous Drip IV  0.9% sodium chloride infusion, Skin Surgical incision: head; Abrasion: arm, leg/bilateral; Erythema/Redness: abdomen, buttocks/bilateral; Ecchymosis: arm/bilateral, and Diabetic management Novolog 0-15 units 3x daily with meals; Novolog 0-5 units daily at bedtime; Semglee 20 units 2x daily     Previous Home Environment (from acute therapy documentation) Living Arrangements: Spouse/significant other  Lives With: Significant  other Available Help at Discharge: Family, Available 24 hours/day Type of Home: House Home Layout: Able to live on main level with bedroom/bathroom Home Access: Stairs to enter Entrance Stairs-Rails: Right, Left, Can reach both Entrance Stairs-Number of Steps: 6 Bathroom Shower/Tub: Health visitor: Handicapped height Bathroom Accessibility: Yes How Accessible: Accessible via walker Home Care Services: No Additional Comments: 3 falls in the last 6 months  Discharge Living Setting Plans for Discharge Living Setting: Patient's home Type of Home at Discharge: House Discharge Home Layout: Able to live on main level with bedroom/bathroom Discharge Home Access: Stairs to enter Entrance Stairs-Rails: Right, Left, Can reach both Entrance Stairs-Number of Steps: 6 Discharge Bathroom Shower/Tub: Walk-in shower Discharge Bathroom Toilet: Handicapped height Discharge Bathroom Accessibility: Yes How Accessible: Accessible via walker Does the patient have any problems obtaining your medications?: No  Social/Family/Support Systems Anticipated Caregiver: Emmagene Sester, husband Anticipated Caregiver's Contact Information: [phone] Caregiver Availability: 24/7 Discharge Plan Discussed with Primary Caregiver: Yes Is Caregiver In Agreement with Plan?: Yes Does Caregiver/Family have Issues with Lodging/Transportation while Pt is in Rehab?: No   Goals Patient/Family Goal for Rehab: *** Expected length of stay: *** Pt/Family Agrees to Admission and willing to participate: Yes Program Orientation Provided & Reviewed with Pt/Caregiver Including Roles  & Responsibilities: Yes   Decrease burden of Care through IP rehab admission: NA   Possible need for SNF placement upon discharge:Not anticipated   Patient Condition: {PATIENT'S CONDITION:22832}  Preadmission Screen Completed By:  Domingo Pulse, CCC-SLP, 12/05/2022 3:55  PM ______________________________________________________________________   Discussed status with Dr. Marland Kitchenon***at *** and received approval for admission today.  Admission Coordinator:  Domingo Pulse, time***/Date***

## 2022-12-06 LAB — GLUCOSE, CAPILLARY
Glucose-Capillary: 116 mg/dL — ABNORMAL HIGH (ref 70–99)
Glucose-Capillary: 99 mg/dL (ref 70–99)
Glucose-Capillary: 96 mg/dL (ref 70–99)
Glucose-Capillary: 144 mg/dL — ABNORMAL HIGH (ref 70–99)

## 2022-12-06 LAB — SURGICAL PATHOLOGY

## 2022-12-06 NOTE — Progress Notes (Incomplete)
Incomplete     Expand All Collapse All PMR Admission Coordinator Pre-Admission Assessment   Patient: Jacqueline Cole is an 72 y.o., female MRN: [id number] DOB: 01-28-1951 Height: 5\' 5"  (165.1 cm) Weight: 103.8 kg                                                                                                                                                  Insurance Information HMO: yes    PPO:      PCP:      IPA:      80/20:      OTHER:  PRIMARY: [employer]      Policy#: [id number]      Subscriber: patient CM Name: ***      Phone#: ***     Fax#: *** Pre-Cert#: ***      Employer: *** Benefits:  Phone #: ***     Name: *** Dolores Hoose. Date: ***     Deduct: ***      Out of Pocket Max: ***      Life Max: ***  CIR: ***      SNF: *** Outpatient: ***     Co-Pay: *** Home Health: ***      Co-Pay: *** DME: ***     Co-Pay: *** Providers: in-network SECONDARY:       Policy#:       Phone#:    Financial Counselor:       Phone#:    The Data processing manager" for patients in Inpatient Rehabilitation Facilities with attached "Privacy Act Statement-Health Care Records" was provided and verbally reviewed with: {CHL IP Patient Family KG:[id number]}   Emergency Contact Information Contact Information       Name Relation Home Work Mobile    Preston Spouse     [phone]         Other Contacts       Name Relation Home Work Mobile    Melton,Karri Daughter     [phone]         Current Medical History  Patient Admitting Diagnosis: brain tumor s/p craniotomy for tumor excision History of Present Illness: Pt is a 72 year old female with medical hx significant for : meningioma, DM II, CKD IIIB, osteopenia, hearing loss. Pt presented to [facility] on 12/01/22 for right frontal craniotomy for tumor resection by Dr. Myer Haff. Therapy evaluations completed and CIR recommended d/t pt's deficits in functional mobility and cognition.  Glasgow Coma Scale Score: 15    Patient's medical record from [facility] has been reviewed by the rehabilitation admission coordinator and physician.   Past Medical History      Past Medical History:  Diagnosis Date   Brain tumor (benign) (HCC)     Chronic kidney disease      stage 3   Depression  Diabetes mellitus type II     Headache      brain tumor   Hyperlipemia     Major depressive disorder, recurrent, in remission (HCC) 09/01/2006    Qualifier: Diagnosis of  By: [name]     Multiple fibroadenomata of both breasts     Obesity     Osteopenia     PAD (peripheral artery disease) (HCC)      on screening   Syncope 1998    DM diagnosis          Has the patient had major surgery during 100 days prior to admission? Yes   Family History  family history includes Breast cancer (age of onset: 48) in her sister; Cancer in her brother and sister; Heart disease in her brother and father; Stroke in her father.     Current Medications   Current Medications    Current Facility-Administered Medications:    0.9 %  sodium chloride infusion, , Intravenous, Continuous, Venetia Night, MD, Stopped at 12/03/22 [id number]   acetaminophen (TYLENOL) tablet 650 mg, 650 mg, Oral, Q4H PRN, 650 mg at 12/04/22 1212 **OR** acetaminophen (TYLENOL) suppository 650 mg, 650 mg, Rectal, Q4H PRN, Venetia Night, MD   bisacodyl (DULCOLAX) suppository 10 mg, 10 mg, Rectal, Daily PRN, Venetia Night, MD   diazepam (VALIUM) tablet 2 mg, 2 mg, Oral, Q8H PRN, Venetia Night, MD, 2 mg at 12/02/22 2143   docusate sodium (COLACE) capsule 100 mg, 100 mg, Oral, BID, Venetia Night, MD, 100 mg at 12/05/22 0914   empagliflozin (JARDIANCE) tablet 25 mg, 25 mg, Oral, QAC breakfast, Venetia Night, MD, 25 mg at 12/05/22 0915   enoxaparin (LOVENOX) injection 40 mg, 40 mg, Subcutaneous, Q24H, Venetia Night, MD, 40 mg at 12/05/22 0916   gabapentin (NEURONTIN) capsule 600 mg, 600 mg, Oral, QHS,  Venetia Night, MD, 600 mg at 12/04/22 2038   glipiZIDE (GLUCOTROL XL) 24 hr tablet 2.5 mg, 2.5 mg, Oral, Q breakfast, Venetia Night, MD, 2.5 mg at 12/05/22 0915   haloperidol lactate (HALDOL) injection 2 mg, 2 mg, Intravenous, Q6H PRN, Venetia Night, MD, 2 mg at 12/02/22 1950   insulin aspart (novoLOG) injection 0-15 Units, 0-15 Units, Subcutaneous, TID WC, Venetia Night, MD, 2 Units at 12/05/22 1352   insulin aspart (novoLOG) injection 0-5 Units, 0-5 Units, Subcutaneous, QHS, Venetia Night, MD, 2 Units at 12/02/22 2112   insulin glargine-yfgn (SEMGLEE) injection 20 Units, 20 Units, Subcutaneous, BID, Venetia Night, MD, 20 Units at 12/05/22 0915   labetalol (NORMODYNE) injection 10-40 mg, 10-40 mg, Intravenous, Q10 min PRN, Venetia Night, MD   magnesium citrate solution 1 Bottle, 1 Bottle, Oral, Once PRN, Venetia Night, MD   naloxone [facility]) injection 0.08 mg, 0.08 mg, Intravenous, PRN, Venetia Night, MD   ondansetron [facility]) tablet 4 mg, 4 mg, Oral, Q4H PRN **OR** ondansetron (ZOFRAN) injection 4 mg, 4 mg, Intravenous, Q4H PRN, Venetia Night, MD, 4 mg at 12/02/22 1155   polyethylene glycol (MIRALAX / GLYCOLAX) packet 17 g, 17 g, Oral, Daily PRN, Venetia Night, MD   promethazine (PHENERGAN) tablet 12.5-25 mg, 12.5-25 mg, Oral, Q4H PRN, Venetia Night, MD   senna (SENOKOT) tablet 8.6 mg, 1 tablet, Oral, BID, Venetia Night, MD, 8.6 mg at 12/05/22 0914   venlafaxine XR (EFFEXOR-XR) 24 hr capsule 150 mg, 150 mg, Oral, QHS, Venetia Night, MD, 150 mg at 12/04/22 2038     Patients Current Diet:  Diet Order  Diet Carb Modified Fluid consistency: Thin; Room service appropriate? Yes  Diet effective now                         Precautions / Restrictions Precautions Precautions: Fall Restrictions Weight Bearing Restrictions: No    Has the patient had 2 or more falls or a fall with injury in the past  year?No   Prior Activity Level Limited Community (1-2x/wk): gets out of house ~2 days/week   Prior Functional Level Prior Function Prior Level of Function : Needs assist Physical Assist : ADLs (physical) ADLs (physical): Bathing, Dressing, IADLs Mobility Comments: uses WC occasionally, uses rollator ADLs Comments: assistance for showering, dressing, husband performs IADLs   Self Care: Did the patient need help bathing, dressing, using the toilet or eating?  Independent   Indoor Mobility: Did the patient need assistance with walking from room to room (with or without device)? Independent   Stairs: Did the patient need assistance with internal or external stairs (with or without device)? Pt avoids steps per husband report   Functional Cognition: Did the patient need help planning regular tasks such as shopping or remembering to take medications? Needed some help   Patient Information Are you of Hispanic, Latino/a,or Spanish origin?: A. No, not of Hispanic, Latino/a, or Spanish origin What is your race?: A. White Do you need or want an interpreter to communicate with a doctor or health care staff?: 0. No   Patient's Response To:  Health Literacy and Transportation Is the patient able to respond to health literacy and transportation needs?: Yes Health Literacy - How often do you need to have someone help you when you read instructions, pamphlets, or other written material from your doctor or pharmacy?: Sometimes In the past 12 months, has lack of transportation kept you from medical appointments or from getting medications?: No In the past 12 months, has lack of transportation kept you from meetings, work, or from getting things needed for daily living?: No   Journalist, newspaper / Equipment Home Assistive Devices/Equipment: Dentures (specify type), Eyeglasses, Raised toilet seat with rails, Shower chair with back, Environmental consultant (specify type), Wheelchair Home Equipment: BSC/3in1, Psychiatric nurse, Firefighter, Rollator (4 wheels), Wheelchair - manual   Prior Device Use: Indicate devices/aids used by the patient prior to current illness, exacerbation or injury? Walker   Current Functional Level Cognition   Overall Cognitive Status: Impaired/Different from baseline Orientation Level: Oriented X4 Following Commands: Follows one step commands consistently Safety/Judgement: Decreased awareness of safety, Decreased awareness of deficits General Comments: oriented to self "hospital", husband in room. with cues able to mention surgery. disoriented to time and displayed some situational confusion throughout session    Extremity Assessment (includes Sensation/Coordination)   Upper Extremity Assessment: Generalized weakness, Difficult to assess due to impaired cognition  Lower Extremity Assessment: Generalized weakness, Difficult to assess due to impaired cognition     ADLs   Overall ADL's : Needs assistance/impaired Lower Body Dressing: Cueing for safety, Cueing for sequencing, Sit to/from stand, Moderate assistance Lower Body Dressing Details (indicate cue type and reason): Pt able to doff/don mesh underwear with MOD A to thread over feet. Toilet Transfer: Contact guard assist, Minimal assistance, Rolling walker (2 wheels), +2 for safety/equipment Toilet Transfer Details (indicate cue type and reason): Simulated to chair. Toileting- Clothing Manipulation and Hygiene: Moderate assistance, Sit to/from stand, Cueing for safety, Cueing for sequencing Toileting - Clothing Manipulation Details (indicate cue type and reason): Pt requires MOD  A for thoroughness when completing peri-hygiene from STS Functional mobility during ADLs: Minimal assistance, Cueing for safety, Rolling walker (2 wheels), Cueing for sequencing, +2 for physical assistance General ADL Comments: requires multimodal cues to attend to tasks and redirect 2/2 cognition. Consistent cueing for sequencing required t/o any  functional task attempted this date.     Mobility   Overal bed mobility: Needs Assistance Bed Mobility: Supine to Sit Supine to sit: Supervision Sit to supine: Min assist, +2 for safety/equipment General bed mobility comments: pt OOB upon PT arrival     Transfers   Overall transfer level: Needs assistance Equipment used: Rolling walker (2 wheels) Transfers: Sit to/from Stand Sit to Stand: Supervision General transfer comment: pt able to complete sit<>stand transfers from toilet x3 with use of RW and hand rail in bathroom with supervision for safety     Ambulation / Gait / Stairs / Wheelchair Mobility   Ambulation/Gait Ambulation/Gait assistance: Contact guard assist Gait Distance (Feet): 75 Feet Assistive device: Rolling walker (2 wheels) Gait Pattern/deviations: Step-through pattern, Decreased stride length General Gait Details: pt with mild instability with use of RW but no overt LOB or need for external physical assistance; VC'ing needed for safety and directional guidance Gait velocity: decreased     Posture / Balance Balance Overall balance assessment: Needs assistance Sitting-balance support: Feet supported Sitting balance-Leahy Scale: Good Standing balance support: Bilateral upper extremity supported, During functional activity, Single extremity supported, No upper extremity supported Standing balance-Leahy Scale: Poor Standing balance comment: able to statically stand without BUE, but required support with dynamic standing/ambulation to maximize safety     Special needs/care consideration Continuous Drip IV  0.9% sodium chloride infusion, Skin Surgical incision: head; Abrasion: arm, leg/bilateral; Erythema/Redness: abdomen, buttocks/bilateral; Ecchymosis: arm/bilateral, and Diabetic management Novolog 0-15 units 3x daily with meals; Novolog 0-5 units daily at bedtime; Semglee 20 units 2x daily        Previous Home Environment (from acute therapy documentation) Living  Arrangements: Spouse/significant other  Lives With: Significant other Available Help at Discharge: Family, Available 24 hours/day Type of Home: House Home Layout: Able to live on main level with bedroom/bathroom Home Access: Stairs to enter Entrance Stairs-Rails: Right, Left, Can reach both Entrance Stairs-Number of Steps: 6 Bathroom Shower/Tub: Health visitor: Handicapped height Bathroom Accessibility: Yes How Accessible: Accessible via walker Home Care Services: No Additional Comments: 3 falls in the last 6 months   Discharge Living Setting Plans for Discharge Living Setting: Patient's home Type of Home at Discharge: House Discharge Home Layout: Able to live on main level with bedroom/bathroom Discharge Home Access: Stairs to enter Entrance Stairs-Rails: Right, Left, Can reach both Entrance Stairs-Number of Steps: 6 Discharge Bathroom Shower/Tub: Walk-in shower Discharge Bathroom Toilet: Handicapped height Discharge Bathroom Accessibility: Yes How Accessible: Accessible via walker Does the patient have any problems obtaining your medications?: No   Social/Family/Support Systems Anticipated Caregiver: Lyllah Oliveto, husband Anticipated Caregiver's Contact Information: [phone] Caregiver Availability: 24/7 Discharge Plan Discussed with Primary Caregiver: Yes Is Caregiver In Agreement with Plan?: Yes Does Caregiver/Family have Issues with Lodging/Transportation while Pt is in Rehab?: No     Goals Patient/Family Goal for Rehab: Mod I-Supervision: PT/OT, Supervision: ST Expected length of stay: 5-7 days Pt/Family Agrees to Admission and willing to participate: Yes Program Orientation Provided & Reviewed with Pt/Caregiver Including Roles  & Responsibilities: Yes     Decrease burden of Care through IP rehab admission: NA     Possible need for SNF placement upon discharge:Not anticipated  Patient Condition: This patient's medical and functional status  has changed since the consult dated: 12/03/22 in which the Rehabilitation Physician determined and documented that the patient's condition is appropriate for intensive rehabilitative care in an inpatient rehabilitation facility. See "History of Present Illness" (above) for medical update. Functional changes are: Moiblity improved to Supervision-Contact G and ADLs have improved to Supervision-Min A. Patient's medical and functional status update has been discussed with the Rehabilitation physician and patient remains appropriate for inpatient rehabilitation. Will admit to inpatient rehab today.   Preadmission Screen Completed By:  Domingo Pulse, CCC-SLP, 12/05/2022 3:55 PM ____________________________________________________________________

## 2022-12-06 NOTE — Plan of Care (Signed)
  Problem: Coping: Goal: Ability to adjust to condition or change in health will improve Outcome: Progressing   Problem: Fluid Volume: Goal: Ability to maintain a balanced intake and output will improve Outcome: Progressing   Problem: Metabolic: Goal: Ability to maintain appropriate glucose levels will improve Outcome: Progressing   Problem: Nutritional: Goal: Maintenance of adequate nutrition will improve Outcome: Progressing Goal: Progress toward achieving an optimal weight will improve Outcome: Progressing   Problem: Skin Integrity: Goal: Risk for impaired skin integrity will decrease Outcome: Progressing   Problem: Tissue Perfusion: Goal: Adequacy of tissue perfusion will improve Outcome: Progressing   Problem: Activity: Goal: Risk for activity intolerance will decrease Outcome: Progressing

## 2022-12-06 NOTE — Progress Notes (Signed)
Inpatient Rehab Admissions Coordinator:  ?Insurance authorization started. Will continue to follow. ? ? ?Adela Esteban Graves Madden, MS, CCC-SLP ?Admissions Coordinator ?[phone] ? ?

## 2022-12-06 NOTE — Progress Notes (Signed)
Neurosurgery Progress Note  History: Jacqueline Cole is s/p right craniotomy for tumor resection  POD5: NAEO POD4:  Continues to show improvement.  PT still states that they are having cognitive deficits, particularly with safety awareness and short-term memory difficulties.  POD3:  Pt doing much better. Able to walk to bathroom.  PMR recommending inpatient rehab  POD2: Pt was disoriented and combative overnight receiving haldol. Husband at bedside reports improvement back to baseline this morning. Pt denying headache POD1: Pt complaining of headache otherwise at baseline per husband at bedside  Physical Exam: Vitals:   12/05/22 2023 12/06/22 0500  BP: (!) 159/60 (!) 185/67  Pulse: 76 81  Resp: 18 17  Temp: 98.1 F (36.7 C) 98.8 F (37.1 C)  SpO2: 95% 99%    AA Ox3.  CNI No drift or dysmetria Incision covered with clean post-op dressing   Data:  Other tests/results:  Path pending   Assessment/Plan:  Jacqueline Cole is a 72 y.o s/p right craniotomy for tumor resection. Headache improved post-op. Pt returned to cognitive baseline   - mobilize - DVT prophylaxis - will continue to monitor blood glucose. SSI ordered - steroids stopped due to confusion and hyperglycemia  - will keep post-op dressing in place and remove on day of discharge. - PTOT; plan for d/c to IPR  Manning Charity PA-C Department of Neurosurgery

## 2022-12-06 NOTE — Progress Notes (Signed)
Occupational Therapy Treatment Patient Details Name: Jacqueline Cole MRN: [id number] DOB: Dec 05, 1950 Today's Date: 12/06/2022   History of present illness Pt is a 72 y/o female presenting s/p right frontal craniotomy for tumor excision. PMH includes memory deficits, PVD, CVA, T2D, HLD, obesity, and osteopenia.   OT comments  Upon entering the room, pt seated in recliner chair with significant other present. Pt agreeable to OT intervention. Pt with general confusion during session and tangental speech. Pt needing min - mod multimodal cuing to redirect to task. She doff/donn's socks while seated wth figure four position and supervision for safety. She stands from recliner chair with min A and ambulates to bathroom with CGA for toileting needs. CGA for toilet transfer with pt able to void and needing min A for clothing management in standing. Pt stands at sink for grooming with CGA for balance and cues for sequencing. Pt ambulating in hallway ~ 100' without use of AD but needing CGA for balance. Focus on navigation with min cuing to locate room number and room. Pt seated in recliner chair at end of session with chair alarm activated and all needs within reach.       If plan is discharge home, recommend the following:  A lot of help with bathing/dressing/bathroom;Assistance with cooking/housework;Assist for transportation;Direct supervision/assist for medications management;Supervision due to cognitive status;Direct supervision/assist for financial management;Help with stairs or ramp for entrance;A little help with walking and/or transfers   Equipment Recommendations  Other (comment) (defer)       Precautions / Restrictions Precautions Precautions: Fall Restrictions Weight Bearing Restrictions: No       Mobility Bed Mobility               General bed mobility comments: pt OOB upon OT arrival    Transfers Overall transfer level: Needs assistance Equipment used: 1 person hand held  assist Transfers: Sit to/from Stand Sit to Stand: Min assist, Contact guard assist                 Balance Overall balance assessment: Needs assistance Sitting-balance support: Feet supported Sitting balance-Leahy Scale: Good     Standing balance support: During functional activity, No upper extremity supported Standing balance-Leahy Scale: Fair                             ADL either performed or assessed with clinical judgement   ADL Overall ADL's : Needs assistance/impaired                     Lower Body Dressing: Sitting/lateral leans;Supervision/safety Lower Body Dressing Details (indicate cue type and reason): use figure four position while seated in recliner chair with supervison to don socks Toilet Transfer: Contact guard Actor;Ambulation;Grab bars   Toileting- Clothing Manipulation and Hygiene: Minimal assistance;Sit to/from stand       Functional mobility during ADLs: Contact guard assist;Minimal assistance      Extremity/Trunk Assessment               Cognition Arousal: Alert Behavior During Therapy: WFL for tasks assessed/performed Overall Cognitive Status: Impaired/Different from baseline Area of Impairment: Memory, Safety/judgement, Orientation, Attention, Awareness, Problem solving                 Orientation Level: Disoriented to, Time, Situation Current Attention Level: Focused Memory: Decreased short-term memory Following Commands: Follows one step commands consistently Safety/Judgement: Decreased awareness of safety, Decreased awareness of deficits Awareness: Intellectual Problem  Solving: Requires verbal cues, Requires tactile cues General Comments: Pt follows commands with min - mod multimodal cuing with increased time to process. Very friendly but tangential in speech.                   Pertinent Vitals/ Pain       Pain Assessment Pain Assessment: Faces Faces Pain Scale: No hurt          Frequency  Min 1X/week        Progress Toward Goals  OT Goals(current goals can now be found in the care plan section)  Progress towards OT goals: Progressing toward goals      AM-PAC OT "6 Clicks" Daily Activity     Outcome Measure   Help from another person eating meals?: None Help from another person taking care of personal grooming?: A Little Help from another person toileting, which includes using toliet, bedpan, or urinal?: A Little Help from another person bathing (including washing, rinsing, drying)?: A Little Help from another person to put on and taking off regular upper body clothing?: A Little Help from another person to put on and taking off regular lower body clothing?: A Lot 6 Click Score: 18    End of Session    OT Visit Diagnosis: Other abnormalities of gait and mobility (R26.89);Other symptoms and signs involving cognitive function;Muscle weakness (generalized) (M62.81)   Activity Tolerance Patient tolerated treatment well   Patient Left in chair;with chair alarm set;with call bell/phone within reach;with family/visitor present   Nurse Communication          Time: 6962-9528 OT Time Calculation (min): 24 min  Charges: OT General Charges $OT Visit: 1 Visit OT Treatments $Self Care/Home Management : 8-22 mins $Therapeutic Activity: 8-22 mins  Jackquline Denmark, MS, OTR/L , CBIS ascom [phone]  12/06/22, 10:59 AM

## 2022-12-06 NOTE — TOC Progression Note (Signed)
Transition of Care [facility]) - Progression Note    Patient Details  Name: Jacqueline Cole MRN: [id number] Date of Birth: 1951/01/05  Transition of [facility]) CM/SW Contact  Allena Katz, LCSW Phone Number: 12/06/2022, 3:52 PM  Clinical Narrative:   CIR starting auth. TOC following.          Expected Discharge Plan and Services                                               Social Determinants of Health (SDOH) Interventions SDOH Screenings   Food Insecurity: No Food Insecurity (12/01/2022)  Housing: Low Risk  (12/01/2022)  Transportation Needs: No Transportation Needs (12/01/2022)  Utilities: Not At Risk (12/01/2022)  Depression (PHQ2-9): High Risk (09/22/2022)  Financial Resource Strain: Low Risk  (04/03/2020)  Tobacco Use: Medium Risk (12/01/2022)    Readmission Risk Interventions     No data to display

## 2022-12-06 NOTE — Progress Notes (Signed)
Physical Therapy Treatment Patient Details Name: Jacqueline Cole MRN: [id number] DOB: [birth year] Today's Date: 12/06/2022   History of Present Illness Pt is a 72 y/o female presenting s/p right frontal craniotomy for tumor excision. PMH includes memory deficits, PVD, CVA, T2D, HLD, obesity, and osteopenia.    PT Comments  Pt was pleasant and motivated to participate during the session and put forth good effort throughout. Pt continues to be supervision assist for bed mobility, and is supervision for transfers without AD. She was able to amb ~100 feet with RW and CGA, light instability seen, pt demonstrating overall decreased safety awareness requiring cues for safe AD management and hallway navigation. Pt continues to be confused; following one-step commands inconsistently throughout session; attempted to amb backwards from hall back to chair in room, able to correct with cues and redirections. She was able to perform various balance activities with no AD, standing at raised tray with consistent cues to maintain LE position (semi-tandem, NBOS). Pt will benefit from continued PT services upon discharge to safely address deficits listed in patient problem list for decreased caregiver assistance and eventual return to PLOF.      If plan is discharge home, recommend the following: Assistance with cooking/housework;Assistance with feeding;Help with stairs or ramp for entrance;Assist for transportation;Direct supervision/assist for financial management;Direct supervision/assist for medications management;Supervision due to cognitive status;A little help with bathing/dressing/bathroom;A little help with walking and/or transfers   Can travel by private vehicle        Equipment Recommendations  Other (comment) (defer to next venue of care)    Recommendations for Other Services       Precautions / Restrictions Precautions Precautions: Fall Restrictions Weight Bearing Restrictions: No      Mobility  Bed Mobility Overal bed mobility: Needs Assistance Bed Mobility: Supine to Sit     Supine to sit: Supervision          Transfers Overall transfer level: Needs assistance Equipment used: None Transfers: Sit to/from Stand Sit to Stand: Supervision           General transfer comment: Able to perform STS from EOB and recliner without use of AD, overall slow speed    Ambulation/Gait Ambulation/Gait assistance: Contact guard assist Gait Distance (Feet): 100 Feet Assistive device: Rolling walker (2 wheels) Gait Pattern/deviations: Step-through pattern, Decreased stride length, Decreased step length - left, Decreased step length - right Gait velocity: decreased     General Gait Details: pt walking with light instability but no LOB or physical assist needed. Cues porvided for safe AD management, directional navigation, and posture.   Stairs             Wheelchair Mobility     Tilt Bed    Modified Rankin (Stroke Patients Only)       Balance Overall balance assessment: Needs assistance Sitting-balance support: Feet supported Sitting balance-Leahy Scale: Good       Standing balance-Leahy Scale: Good Standing balance comment: Static standing with NBOS, no AD                            Cognition Arousal: Alert Behavior During Therapy: WFL for tasks assessed/performed Overall Cognitive Status: Impaired/Different from baseline Area of Impairment: Memory, Safety/judgement                     Memory: Decreased short-term memory Following Commands: Follows one step commands inconsistently Safety/Judgement: Decreased awareness of safety, Decreased awareness of deficits  General Comments: Pt continues to have general confusion, notably impacting overall safety and safety awareness.        Exercises Other Exercises Other Exercises: Performed standing balance exercises in normal stance, NBOS, and semi-tandem, while pt  perofrmed cross body reaching with various items on raised tray, no LOB seen, pt reports mild instabilty with semi-tandem.    General Comments        Pertinent Vitals/Pain Pain Assessment Pain Assessment: Faces Faces Pain Scale: No hurt    Home Living                          Prior Function            PT Goals (current goals can now be found in the care plan section) Progress towards PT goals: Progressing toward goals    Frequency    Min 1X/week      PT Plan      Co-evaluation              AM-PAC PT "6 Clicks" Mobility   Outcome Measure  Help needed turning from your back to your side while in a flat bed without using bedrails?: None Help needed moving from lying on your back to sitting on the side of a flat bed without using bedrails?: None Help needed moving to and from a bed to a chair (including a wheelchair)?: A Little Help needed standing up from a chair using your arms (e.g., wheelchair or bedside chair)?: None Help needed to walk in hospital room?: A Little Help needed climbing 3-5 steps with a railing? : A Lot 6 Click Score: 20    End of Session Equipment Utilized During Treatment: Gait belt Activity Tolerance: Patient tolerated treatment well Patient left: in chair;with call bell/phone within reach;with family/visitor present;with chair alarm set Nurse Communication: Mobility status PT Visit Diagnosis: Other abnormalities of gait and mobility (R26.89);Muscle weakness (generalized) (M62.81);History of falling (Z91.81);Difficulty in walking, not elsewhere classified (R26.2)     Time: 5366-4403 PT Time Calculation (min) (ACUTE ONLY): 29 min  Charges:                            Cecile Sheerer, SPT 12/06/22, 10:01 AM

## 2022-12-07 LAB — GLUCOSE, CAPILLARY
Glucose-Capillary: 115 mg/dL — ABNORMAL HIGH (ref 70–99)
Glucose-Capillary: 86 mg/dL (ref 70–99)

## 2022-12-07 NOTE — Progress Notes (Signed)
Neurosurgery Progress Note  History: Jacqueline Cole is s/p right craniotomy for tumor resection  POD6: Pt was able to wash her hair yesterday and reports this went well. Is eager to go home. POD5: NAEO POD4:  Continues to show improvement.  PT still states that they are having cognitive deficits, particularly with safety awareness and short-term memory difficulties.  POD3:  Pt doing much better. Able to walk to bathroom.  PMR recommending inpatient rehab  POD2: Pt was disoriented and combative overnight receiving haldol. Husband at bedside reports improvement back to baseline this morning. Pt denying headache POD1: Pt complaining of headache otherwise at baseline per husband at bedside  Physical Exam: Vitals:   12/06/22 1950 12/07/22 0449  BP: 130/64 (!) 148/50  Pulse: 79 80  Resp: 16 18  Temp: 98.7 F (37.1 C) 98.7 F (37.1 C)  SpO2: 98% 99%    AA Ox3.  CNI No drift or dysmetria Incision c/d/i  Data:  Other tests/results:  Path pending   Assessment/Plan:  Jacqueline Cole is a 72 y.o s/p right craniotomy for tumor resection. Headache improved post-op. Pt returned to cognitive baseline   - mobilize - DVT prophylaxis - will continue to monitor blood glucose. SSI ordered - steroids stopped due to confusion and hyperglycemia  - Post-op dressing removed yesterday. Continue to monitor. OK to wash hair. Please avoid scrubbing incision.  - PTOT; plan for d/c to IPR vs home   Manning Charity PA-C Department of Neurosurgery

## 2022-12-07 NOTE — TOC Transition Note (Signed)
Transition of Care [facility]) - CM/SW Discharge Note   Patient Details  Name: Jacqueline Cole MRN: [id number] Date of Birth: 01/15/1951  Transition of Care [facility]) CM/SW Contact:  Allena Katz, LCSW Phone Number: 12/07/2022, 11:27 AM   Clinical Narrative:   Pt discharging today with [facility]. No other needs at this time. TOC signing off.     Final next level of care: Home w Home Health Services Barriers to Discharge: Barriers Resolved   Patient Goals and CMS Choice CMS Medicare.gov Compare Post Acute Care list provided to:: Patient Represenative (must comment) (husband) Choice offered to / list presented to : Spouse  Discharge Placement                    Name of family member notified: husband    Discharge Plan and Services Additional resources added to the After Visit Summary for                            [facility] Arranged: OT, PT HH Agency: [facility] Date [facility] Agency Contacted: 12/07/22 Time HH Agency Contacted: 1000 Representative spoke with at [facility] Agency: [place]  Social Determinants of Health (SDOH) Interventions SDOH Screenings   Food Insecurity: No Food Insecurity (12/01/2022)  Housing: Low Risk  (12/01/2022)  Transportation Needs: No Transportation Needs (12/01/2022)  Utilities: Not At Risk (12/01/2022)  Depression (PHQ2-9): High Risk (09/22/2022)  Financial Resource Strain: Low Risk  (04/03/2020)  Tobacco Use: Medium Risk (12/01/2022)     Readmission Risk Interventions     No data to display

## 2022-12-07 NOTE — Care Management Important Message (Signed)
Important Message  Patient Details  Name: JAMERIA JOU MRN: [id number] Date of Birth: 11/30/1950   Important Message Given:  Yes - Medicare IM     Verita Schneiders Susan Bleich 12/07/2022, 11:32 AM

## 2022-12-07 NOTE — Progress Notes (Signed)
Introduced patient to role of Statistician. Patient's husband Mellody Dance, at bedside. Patient agreeable to reviewing intake questions with husband present.   Patient lives at home with her husband and two Insurance account manager dogs. Her husband drives her to all her doctor's appointments. Her primary care provider Dr. Tillman Abide is currently managing her mental health and mental health medications. Patient states she is "happy" with this care and denies need for mental health provider at this time.  Currently there are no SDOH needs, although husband states there may be "down the road" as they continue to navigate patient's illness. Husband provided with contact number of nurse navigator should any needs arise.  Patient and husband anticipate d/c plan to be CIR.

## 2022-12-07 NOTE — Progress Notes (Signed)
Physical Therapy Treatment Patient Details Name: Jacqueline Cole MRN: [id number] DOB: Jul 07, 1950 Today's Date: 12/07/2022   History of Present Illness Pt is a 72 y/o female presenting s/p right frontal craniotomy for tumor excision. PMH includes memory deficits, PVD, CVA, T2D, HLD, obesity, and osteopenia.    PT Comments  Increased time spent with pt and pt's husband to assess and discuss current LOF and safe d/c options. Pt initially had PT recs for transition to CIR once medically cleared unless pt appeared safe to d/c home with husband at time of release. This morning pt was able to demonstrate Supervision for bed mobility and transfers. No LOB while ambulating without AD in hall despite short shuffling steps, fatigue, and being easily distracted. Pt and husband educated on frontal lobe function and possible deficits to help with pt's needs and recovery. Also educated on floor transfers in case pt ends up sliding out of bed which has previously occurred PTA. Overall, pt has made great progress since recent surgery and appears functionally safe to transition home with multiple home health disciplines, pt and husband in agreement.    If plan is discharge home, recommend the following: Assistance with cooking/housework;Assistance with feeding;Help with stairs or ramp for entrance;Assist for transportation;Direct supervision/assist for financial management;Direct supervision/assist for medications management;Supervision due to cognitive status;A little help with bathing/dressing/bathroom;A little help with walking and/or transfers   Can travel by private vehicle        Equipment Recommendations  Other (comment) (Pt does not need any DME, would benefit from HHPT, OT, and ST if decides to go home)    Recommendations for Other Services       Precautions / Restrictions Precautions Precautions: Fall Restrictions Weight Bearing Restrictions: No     Mobility  Bed Mobility Overal bed mobility:  Needs Assistance Bed Mobility: Supine to Sit     Supine to sit: Supervision, HOB elevated     General bed mobility comments: Pt requires increased time, has an adjustable bed at home    Transfers Overall transfer level: Needs assistance Equipment used: None Transfers: Sit to/from Stand Sit to Stand: Supervision           General transfer comment: Able to perform STS from EOB and recliner without use of AD, overall slow speed    Ambulation/Gait Ambulation/Gait assistance: Contact guard assist Gait Distance (Feet): 150 Feet Assistive device: None Gait Pattern/deviations: Step-through pattern, Decreased stride length, Decreased step length - left, Decreased step length - right Gait velocity: decreased     General Gait Details: No LOB while ambulating unsupported, however remains a fall risk due to decreased safety awareness and easily distracted   Stairs             Wheelchair Mobility     Tilt Bed    Modified Rankin (Stroke Patients Only)       Balance Overall balance assessment: Needs assistance Sitting-balance support: Feet supported Sitting balance-Leahy Scale: Good     Standing balance support: During functional activity, No upper extremity supported Standing balance-Leahy Scale: Fair Standing balance comment: Static standing with NBOS, no AD                            Cognition Arousal: Alert Behavior During Therapy: WFL for tasks assessed/performed Overall Cognitive Status: Impaired/Different from baseline Area of Impairment: Memory, Safety/judgement, Attention, Awareness, Problem solving  Current Attention Level: Focused Memory: Decreased short-term memory Following Commands: Follows one step commands consistently Safety/Judgement: Decreased awareness of safety, Decreased awareness of deficits Awareness: Intellectual Problem Solving: Requires verbal cues, Requires tactile cues General Comments: Pt  follows commands with min - mod multimodal cuing with increased time to process. Very friendly but tangential in speech.        Exercises General Exercises - Lower Extremity Ankle Circles/Pumps: AROM, Both, 10 reps Long Arc Quad: AROM, Both, 10 reps, Seated Hip Flexion/Marching: AROM, Both, 10 reps, Seated Other Exercises Other Exercises: Pt and husband given information on frontal lobe function to assist with understanding possible deficits pt may be experiencing. Also given and educated on technique to assist or independently get up from floor level. Other Exercises: Pt and husband educated on role of PT and d/c home vs Rehab.    General Comments General comments (skin integrity, edema, etc.): Slight feeling of lightheadedness while ambulating, no LOB or visual changes      Pertinent Vitals/Pain Pain Assessment Pain Assessment: No/denies pain    Home Living                          Prior Function            PT Goals (current goals can now be found in the care plan section) Acute Rehab PT Goals Patient Stated Goal: get better Progress towards PT goals: Progressing toward goals    Frequency    Min 1X/week      PT Plan      Co-evaluation              AM-PAC PT "6 Clicks" Mobility   Outcome Measure  Help needed turning from your back to your side while in a flat bed without using bedrails?: None Help needed moving from lying on your back to sitting on the side of a flat bed without using bedrails?: None Help needed moving to and from a bed to a chair (including a wheelchair)?: A Little Help needed standing up from a chair using your arms (e.g., wheelchair or bedside chair)?: None Help needed to walk in hospital room?: A Little Help needed climbing 3-5 steps with a railing? : A Little 6 Click Score: 21    End of Session Equipment Utilized During Treatment: Gait belt Activity Tolerance: Patient tolerated treatment well Patient left: in chair;with  call bell/phone within reach;with family/visitor present;with chair alarm set Nurse Communication: Mobility status PT Visit Diagnosis: Other abnormalities of gait and mobility (R26.89);Muscle weakness (generalized) (M62.81);History of falling (Z91.81);Difficulty in walking, not elsewhere classified (R26.2)     Time: 5621-3086 PT Time Calculation (min) (ACUTE ONLY): 56 min  Charges:    $Gait Training: 8-22 mins $Therapeutic Exercise: 8-22 mins $Therapeutic Activity: 38-52 mins PT General Charges $$ ACUTE PT VISIT: 1 Visit                    Zadie Cleverly, PTA  Jannet Askew 12/07/2022, 12:12 PM

## 2022-12-07 NOTE — Progress Notes (Signed)
Inpatient Rehab Admissions Coordinator:  Pt going home with [facility]. AC will sign off.   Wolfgang Phoenix, MS, CCC-SLP Admissions Coordinator [phone]

## 2022-12-07 NOTE — TOC Progression Note (Signed)
Transition of Care [facility]) - Progression Note    Patient Details  Name: Jacqueline Cole MRN: [id number] Date of Birth: Apr 08, 1950  [facility] CM/SW Contact  Allena Katz, LCSW Phone Number: 12/07/2022, 11:14 AM  Clinical Narrative:    Family now wanting to take patient home with [facility] PT/OT/Speech. No preference of agency. Referral accepted by [place] with [facility].         Expected Discharge Plan and Services                                               Social Determinants of Health (SDOH) Interventions SDOH Screenings   Food Insecurity: No Food Insecurity (12/01/2022)  Housing: Low Risk  (12/01/2022)  Transportation Needs: No Transportation Needs (12/01/2022)  Utilities: Not At Risk (12/01/2022)  Depression (PHQ2-9): High Risk (09/22/2022)  Financial Resource Strain: Low Risk  (04/03/2020)  Tobacco Use: Medium Risk (12/01/2022)    Readmission Risk Interventions     No data to display

## 2022-12-07 NOTE — Plan of Care (Signed)
  Problem: Coping: Goal: Ability to adjust to condition or change in health will improve Outcome: Progressing   Problem: Fluid Volume: Goal: Ability to maintain a balanced intake and output will improve Outcome: Progressing   Problem: Metabolic: Goal: Ability to maintain appropriate glucose levels will improve Outcome: Progressing   Problem: Nutritional: Goal: Maintenance of adequate nutrition will improve Outcome: Progressing Goal: Progress toward achieving an optimal weight will improve Outcome: Progressing   Problem: Skin Integrity: Goal: Risk for impaired skin integrity will decrease Outcome: Progressing   Problem: Tissue Perfusion: Goal: Adequacy of tissue perfusion will improve Outcome: Progressing

## 2022-12-09 ENCOUNTER — Telehealth: Payer: Self-pay | Admitting: Neurosurgery

## 2022-12-09 ENCOUNTER — Telehealth: Payer: Self-pay | Admitting: Internal Medicine

## 2022-12-09 DIAGNOSIS — N1832 Chronic kidney disease, stage 3b: Secondary | ICD-10-CM | POA: Diagnosis not present

## 2022-12-09 DIAGNOSIS — Z794 Long term (current) use of insulin: Secondary | ICD-10-CM | POA: Diagnosis not present

## 2022-12-09 DIAGNOSIS — E1151 Type 2 diabetes mellitus with diabetic peripheral angiopathy without gangrene: Secondary | ICD-10-CM | POA: Diagnosis not present

## 2022-12-09 DIAGNOSIS — Z483 Aftercare following surgery for neoplasm: Secondary | ICD-10-CM | POA: Diagnosis not present

## 2022-12-09 DIAGNOSIS — E785 Hyperlipidemia, unspecified: Secondary | ICD-10-CM | POA: Diagnosis not present

## 2022-12-09 DIAGNOSIS — E1142 Type 2 diabetes mellitus with diabetic polyneuropathy: Secondary | ICD-10-CM | POA: Diagnosis not present

## 2022-12-09 DIAGNOSIS — E1122 Type 2 diabetes mellitus with diabetic chronic kidney disease: Secondary | ICD-10-CM | POA: Diagnosis not present

## 2022-12-09 DIAGNOSIS — F331 Major depressive disorder, recurrent, moderate: Secondary | ICD-10-CM | POA: Diagnosis not present

## 2022-12-09 DIAGNOSIS — M1711 Unilateral primary osteoarthritis, right knee: Secondary | ICD-10-CM | POA: Diagnosis not present

## 2022-12-09 NOTE — Telephone Encounter (Signed)
Per discussion with Dr Myer Haff, she can try OTC benadryl and if it is bothering her too much, she can come by for me to remove the sutures. She is permitted to wash her hair with mild shampoo.  I called and spoke with Mr Basil and informed him of the above. I discussed the importance of not touching and scratching the incision. He inquired about using hydrocortisone cream, and I explained that we discourage putting anything directly on the incision. He reports Mrs Holscher has already washed her hair with baby shampoo. They are going to try the benadryl and will contact me if they decide to come in for suture removal.

## 2022-12-09 NOTE — Telephone Encounter (Signed)
Home Health verbal orders Caller Name: Kreg Shropshire, PT  Agency Name: [facility]   Callback number: [phone]  Requesting OT/PT/Skilled nursing/Social Work/Speech: Physical Therapy   Reason: Recently discharged from hospital after surgery to remove brain tumor, will be working with patient on walking and balance.   Frequency: 1wk 6   Please forward to [facility] pool or providers CMA

## 2022-12-09 NOTE — Telephone Encounter (Signed)
Patient's husband has called stating that his wife's incision was itchy, wanted to know what he could do or put on there- please advise

## 2022-12-10 ENCOUNTER — Encounter: Payer: Self-pay | Admitting: Pharmacist

## 2022-12-10 ENCOUNTER — Inpatient Hospital Stay: Payer: Medicare HMO | Admitting: Internal Medicine

## 2022-12-10 NOTE — Telephone Encounter (Signed)
Left verbal orders on verified VM 

## 2022-12-10 NOTE — Progress Notes (Signed)
Pharmacy Quality Measure Review  This patient is appearing on a report for being at risk of failing the adherence measure for cholesterol (statin) medications this calendar year.   Medication: atorvastatin 20 mg Last fill date: 11/12/22 for 30 day supply  Patient was in the hospital for an operation and thus has not picked up her refill yet. Husband plans to pick up the atorvastatin from [facility].   No further action needed at this time.

## 2022-12-13 ENCOUNTER — Telehealth: Payer: Self-pay

## 2022-12-14 ENCOUNTER — Encounter: Payer: Self-pay | Admitting: Internal Medicine

## 2022-12-14 ENCOUNTER — Encounter: Payer: Medicare HMO | Admitting: Obstetrics and Gynecology

## 2022-12-14 ENCOUNTER — Ambulatory Visit (INDEPENDENT_AMBULATORY_CARE_PROVIDER_SITE_OTHER): Payer: Medicare HMO | Admitting: Internal Medicine

## 2022-12-14 VITALS — BP 124/80 | HR 68 | Temp 98.4°F | Ht 65.0 in | Wt 224.0 lb

## 2022-12-14 DIAGNOSIS — D329 Benign neoplasm of meninges, unspecified: Secondary | ICD-10-CM | POA: Diagnosis not present

## 2022-12-14 DIAGNOSIS — Z7689 Persons encountering health services in other specified circumstances: Secondary | ICD-10-CM

## 2022-12-14 DIAGNOSIS — F331 Major depressive disorder, recurrent, moderate: Secondary | ICD-10-CM | POA: Diagnosis not present

## 2022-12-14 DIAGNOSIS — Z7984 Long term (current) use of oral hypoglycemic drugs: Secondary | ICD-10-CM

## 2022-12-14 DIAGNOSIS — E1142 Type 2 diabetes mellitus with diabetic polyneuropathy: Secondary | ICD-10-CM

## 2022-12-14 DIAGNOSIS — Z794 Long term (current) use of insulin: Secondary | ICD-10-CM | POA: Diagnosis not present

## 2022-12-14 DIAGNOSIS — N898 Other specified noninflammatory disorders of vagina: Secondary | ICD-10-CM

## 2022-12-14 NOTE — Assessment & Plan Note (Signed)
Mood much better now since the surgery Will revisit the venlafaxine next time-- consider decreasing if mood still good

## 2022-12-14 NOTE — Assessment & Plan Note (Signed)
Had resection and did well Pathology showed meningioma but some aggressive features Will await neurosurgery visit next week to decide if more action is needed (other than surveillance)

## 2022-12-14 NOTE — Progress Notes (Signed)
Subjective:    Patient ID: Jacqueline Cole, female    DOB: 18-Apr-1950, 72 y.o.   MRN: [id number]  HPI Here with husband for hospital follow up  Had resection of brain tumor---thought to be meningioma Surgery was uneventful Recovery in ICU Thought she might need rehab--but improved greatly so went home   Had OT and PT visits Awaiting speech therapy  She feels great Only 1-2 mild head pressure sensation--short lived No cognitive problems Depression is better  Checking sugars  Average A1c on the CGM 7.4% 116 this morning No low sugar reactions  Current Outpatient Medications on File Prior to Visit  Medication Sig Dispense Refill   Blood Glucose Monitoring Suppl (ONETOUCH VERIO FLEX SYSTEM) w/Device KIT Use to check blood sugar 2 times a day. 1 kit 0   Continuous Blood Gluc Sensor (FREESTYLE LIBRE 3 SENSOR) MISC PLACE 1 SENSOR ON THE SKIN EVERY 14 DAYS 2 each 11   empagliflozin (JARDIANCE) 25 MG TABS tablet Take 1 tablet (25 mg total) by mouth daily before breakfast. 90 tablet 1   gabapentin (NEURONTIN) 300 MG capsule TAKE TWO CAPSULES BY MOUTH EVERYDAY AT BEDTIME 180 capsule 3   glipiZIDE (GLUCOTROL XL) 2.5 MG 24 hr tablet Take 1 tablet (2.5 mg total) by mouth daily with breakfast. 90 tablet 3   glucose blood (ONETOUCH VERIO) test strip Use as instructed to check blood sugar 2 times a day. 200 each 12   hydrocortisone cream 1 % Apply 1 Application topically daily as needed for itching. 30 g 5   ibuprofen (ADVIL) 400 MG tablet Take 400 mg by mouth every 6 (six) hours as needed.     Ibuprofen-diphenhydrAMINE Cit (ADVIL PM PO) Take 1 tablet by mouth at bedtime as needed.     insulin glargine (LANTUS) 100 UNIT/ML injection Inject 0.35 mLs (35 Units total) into the skin 2 (two) times daily. (Patient taking differently: Inject 35 Units into the skin 2 (two) times daily. 35 am , 30 pm) 20 mL 12   Lancets (ONETOUCH ULTRASOFT) lancets Use as instructed to check blood sugar 2 times a day.  200 each 12   triamcinolone cream (KENALOG) 0.1 % Apply TO THE affected area(s) TOPICALLY twice daily AS NEEDED 45 g 0   trolamine salicylate (ASPERCREME) 10 % cream Apply 1 Application topically as needed for muscle pain. Apply to feet 85 g 5   venlafaxine XR (EFFEXOR-XR) 150 MG 24 hr capsule TAKE ONE CAPSULE BY MOUTH EVERY MORNING and TAKE ONE CAPSULE BY MOUTH EVERYDAY AT BEDTIME 180 capsule 3   atorvastatin (LIPITOR) 20 MG tablet TAKE ONE TABLET BY MOUTH ONCE WEEKLY ON WEDNESDAY and TAKE ONE TABLET BY MOUTH ONCE WEEKLY ON SUNDAY (Patient not taking: Reported on 12/14/2022) 26 tablet 3   No current facility-administered medications on file prior to visit.    Allergies  Allergen Reactions   Onglyza [Saxagliptin] Hives and Nausea And Vomiting   Semaglutide(0.25 Or 0.5mg -Dos) Nausea And Vomiting and Other (See Comments)    Extreme dehydration   Liraglutide Nausea Only    Can't eat, nausea, higher sugars   Tape Other (See Comments)    Thin skin     Past Medical History:  Diagnosis Date   Brain tumor (benign) (HCC)    Chronic kidney disease    stage 3   Depression    Diabetes mellitus type II    Headache    brain tumor   Hyperlipemia    Major depressive disorder, recurrent, in remission (HCC)  09/01/2006   Qualifier: Diagnosis of  By: [name]     Multiple fibroadenomata of both breasts    Obesity    Osteopenia    PAD (peripheral artery disease) (HCC)    on screening   Syncope 1998   DM diagnosis    Past Surgical History:  Procedure Laterality Date   ABDOMINAL HYSTERECTOMY  ~2005   and BSO   APPLICATION OF CRANIAL NAVIGATION N/A 12/01/2022   Procedure: APPLICATION OF CRANIAL NAVIGATION;  Surgeon: Venetia Night, MD;  Location: [facility] OR[facility];  Service: Neurosurgery;  Laterality: N/A;   BREAST BIOPSY Right 01/05/2019   rt stereo bx 2 areas 1st area ribbon clip   BREAST BIOPSY Right 01/05/2019   rt stereo bx 2nd area coil clip   CATARACT EXTRACTION W/ INTRAOCULAR LENS  & ANTERIOR VITRECTOMY Bilateral 01/2022   CHOLECYSTECTOMY  1975   COSMETIC SURGERY     Injury Right face as a child   CRANIOTOMY N/A 12/01/2022   Procedure: RIGHT FRONTAL CRANIOTOMY FOR TUMOR EXCISION;  Surgeon: Venetia Night, MD;  Location: [facility];  Service: Neurosurgery;  Laterality: N/A;   ESOPHAGOGASTRODUODENOSCOPY (EGD) WITH PROPOFOL N/A 11/18/2020   Procedure: ESOPHAGOGASTRODUODENOSCOPY (EGD) WITH PROPOFOL;  Surgeon: Wyline Mood, MD;  Location: [facility];  Service: Gastroenterology;  Laterality: N/A;   VAGINAL DELIVERY     X 2    Family History  Problem Relation Age of Onset   Stroke Father        CVA   Heart disease Father        MI   Cancer Sister        breast cancer   Breast cancer Sister 43   Heart disease Brother        MI   Cancer Brother        Lung    Social History   Socioeconomic History   Marital status: Married    Spouse name: Not on file   Number of children: 2   Years of education: Not on file   Highest education level: Not on file  Occupational History    Comment: "Just up and quit" at [employer]  Tobacco Use   Smoking status: Former    Current packs/day: 0.00    Types: Cigarettes    Quit date: 03/15/1993    Years since quitting: 29.7    Passive exposure: Never   Smokeless tobacco: Never  Vaping Use   Vaping status: Never Used  Substance and Sexual Activity   Alcohol use: No    Alcohol/week: 0.0 standard drinks of alcohol   Drug use: No   Sexual activity: Not Currently    Birth control/protection: None  Other Topics Concern   Not on file  Social History Narrative   Has living will   Husband, then daughter Arlana Hove, have health care POA   Would reluctantly accept resuscitation but no life prolonging measures   Social Determinants of Health   Financial Resource Strain: Low Risk  (04/03/2020)   Overall Financial Resource Strain (CARDIA)    Difficulty of Paying Living Expenses: Not very hard  Food Insecurity: No Food Insecurity  (12/01/2022)   Hunger Vital Sign    Worried About Running Out of Food in the Last Year: Never true    Ran Out of Food in the Last Year: Never true  Transportation Needs: No Transportation Needs (12/01/2022)   PRAPARE - Administrator, Civil Service (Medical): No    Lack of Transportation (Non-Medical): No  Physical  Activity: Not on file  Stress: Not on file  Social Connections: Not on file  Intimate Partner Violence: Not At Risk (12/01/2022)   Humiliation, Afraid, Rape, and Kick questionnaire    Fear of Current or Ex-Partner: No    Emotionally Abused: No    Physically Abused: No    Sexually Abused: No   Review of Systems Sleeping great Appetite is still not good Had hematuria vs vaginal bleeding before the surgery---now will be seeing gyn (though had hysterectomy)     Objective:   Physical Exam Constitutional:      Appearance: Normal appearance.  Neurological:     Mental Status: She is alert.  Psychiatric:        Mood and Affect: Mood normal.        Behavior: Behavior normal.            Assessment & Plan:

## 2022-12-14 NOTE — Assessment & Plan Note (Signed)
Sugars are running better Lab Results  Component Value Date   HGBA1C 8.5 (A) 10/25/2022   Now CGM shows 7.5% Will continue the jardiance 25, lantus 35/30 evening and glipizide 2.5 If low sugar reactions --will stop the glipizide

## 2022-12-15 DIAGNOSIS — N1832 Chronic kidney disease, stage 3b: Secondary | ICD-10-CM | POA: Diagnosis not present

## 2022-12-15 DIAGNOSIS — C711 Malignant neoplasm of frontal lobe: Secondary | ICD-10-CM | POA: Diagnosis not present

## 2022-12-15 DIAGNOSIS — C719 Malignant neoplasm of brain, unspecified: Secondary | ICD-10-CM | POA: Diagnosis not present

## 2022-12-15 DIAGNOSIS — Z483 Aftercare following surgery for neoplasm: Secondary | ICD-10-CM | POA: Diagnosis not present

## 2022-12-15 DIAGNOSIS — E1142 Type 2 diabetes mellitus with diabetic polyneuropathy: Secondary | ICD-10-CM | POA: Diagnosis not present

## 2022-12-15 DIAGNOSIS — E1151 Type 2 diabetes mellitus with diabetic peripheral angiopathy without gangrene: Secondary | ICD-10-CM | POA: Diagnosis not present

## 2022-12-15 DIAGNOSIS — E1122 Type 2 diabetes mellitus with diabetic chronic kidney disease: Secondary | ICD-10-CM | POA: Diagnosis not present

## 2022-12-15 DIAGNOSIS — Z794 Long term (current) use of insulin: Secondary | ICD-10-CM | POA: Diagnosis not present

## 2022-12-15 DIAGNOSIS — E785 Hyperlipidemia, unspecified: Secondary | ICD-10-CM | POA: Diagnosis not present

## 2022-12-15 DIAGNOSIS — M1711 Unilateral primary osteoarthritis, right knee: Secondary | ICD-10-CM | POA: Diagnosis not present

## 2022-12-15 DIAGNOSIS — F331 Major depressive disorder, recurrent, moderate: Secondary | ICD-10-CM | POA: Diagnosis not present

## 2022-12-16 ENCOUNTER — Encounter: Payer: Medicare HMO | Admitting: Neurosurgery

## 2022-12-16 ENCOUNTER — Ambulatory Visit: Payer: Self-pay | Admitting: Psychiatry

## 2022-12-17 ENCOUNTER — Encounter: Payer: Medicare HMO | Admitting: Obstetrics and Gynecology

## 2022-12-21 ENCOUNTER — Ambulatory Visit (INDEPENDENT_AMBULATORY_CARE_PROVIDER_SITE_OTHER): Payer: Medicare HMO | Admitting: Neurosurgery

## 2022-12-21 VITALS — BP 148/71 | HR 75 | Temp 97.9°F

## 2022-12-21 DIAGNOSIS — D496 Neoplasm of unspecified behavior of brain: Secondary | ICD-10-CM

## 2022-12-21 DIAGNOSIS — Z09 Encounter for follow-up examination after completed treatment for conditions other than malignant neoplasm: Secondary | ICD-10-CM

## 2022-12-21 NOTE — Progress Notes (Signed)
   REFERRING PHYSICIAN:  Karie Schwalbe, Md [address] [place],  [place] [address]  DOS: 12/01/22 right frontal craniotomy for tumor resection. Pathology: Meningioma.  Final grading pending.  HISTORY OF PRESENT ILLNESS: Jacqueline Cole is about 2 weeks status post right crani for tumor. Overall, she is doing very well post-op. She reports improvement in her balance and brain fog. She has at some incisional itching but otherwise denies any incisional concerns.  PHYSICAL EXAMINATION:  NEUROLOGICAL:  General: In no acute distress.   Awake, alert, oriented to person, place, and time.  Pupils equal round and reactive to light.  Facial tone is symmetric.  Tongue protrusion is midline.  There is no pronator drift.  Strength: 5/5 throughout Incision c/d/I  Imaging:  none  Assessment / Plan: Jacqueline Cole is doing well after craniotomy.  Her final pathology results are still pending.  Dr. Myer Haff will call her with these when they result.  We will otherwise see her in 4 weeks for her regularly scheduled follow-up.  She was encouraged to call the office with any questions or concerns in the interim.  Advised to contact the office if any questions or concerns arise.   Manning Charity PA-C Dept of Neurosurgery

## 2022-12-22 ENCOUNTER — Telehealth: Payer: Self-pay

## 2022-12-22 DIAGNOSIS — E1122 Type 2 diabetes mellitus with diabetic chronic kidney disease: Secondary | ICD-10-CM | POA: Diagnosis not present

## 2022-12-22 DIAGNOSIS — Z794 Long term (current) use of insulin: Secondary | ICD-10-CM | POA: Diagnosis not present

## 2022-12-22 DIAGNOSIS — E1151 Type 2 diabetes mellitus with diabetic peripheral angiopathy without gangrene: Secondary | ICD-10-CM | POA: Diagnosis not present

## 2022-12-22 DIAGNOSIS — N1832 Chronic kidney disease, stage 3b: Secondary | ICD-10-CM | POA: Diagnosis not present

## 2022-12-22 DIAGNOSIS — E785 Hyperlipidemia, unspecified: Secondary | ICD-10-CM | POA: Diagnosis not present

## 2022-12-22 DIAGNOSIS — F331 Major depressive disorder, recurrent, moderate: Secondary | ICD-10-CM | POA: Diagnosis not present

## 2022-12-22 DIAGNOSIS — Z483 Aftercare following surgery for neoplasm: Secondary | ICD-10-CM | POA: Diagnosis not present

## 2022-12-22 DIAGNOSIS — M1711 Unilateral primary osteoarthritis, right knee: Secondary | ICD-10-CM | POA: Diagnosis not present

## 2022-12-22 DIAGNOSIS — E1142 Type 2 diabetes mellitus with diabetic polyneuropathy: Secondary | ICD-10-CM | POA: Diagnosis not present

## 2022-12-23 DIAGNOSIS — C711 Malignant neoplasm of frontal lobe: Secondary | ICD-10-CM | POA: Diagnosis not present

## 2022-12-28 DIAGNOSIS — E1122 Type 2 diabetes mellitus with diabetic chronic kidney disease: Secondary | ICD-10-CM | POA: Diagnosis not present

## 2022-12-28 DIAGNOSIS — M1711 Unilateral primary osteoarthritis, right knee: Secondary | ICD-10-CM | POA: Diagnosis not present

## 2022-12-28 DIAGNOSIS — Z794 Long term (current) use of insulin: Secondary | ICD-10-CM | POA: Diagnosis not present

## 2022-12-28 DIAGNOSIS — Z483 Aftercare following surgery for neoplasm: Secondary | ICD-10-CM | POA: Diagnosis not present

## 2022-12-28 DIAGNOSIS — E1142 Type 2 diabetes mellitus with diabetic polyneuropathy: Secondary | ICD-10-CM | POA: Diagnosis not present

## 2022-12-28 DIAGNOSIS — F331 Major depressive disorder, recurrent, moderate: Secondary | ICD-10-CM | POA: Diagnosis not present

## 2022-12-28 DIAGNOSIS — N1832 Chronic kidney disease, stage 3b: Secondary | ICD-10-CM | POA: Diagnosis not present

## 2022-12-28 DIAGNOSIS — E1151 Type 2 diabetes mellitus with diabetic peripheral angiopathy without gangrene: Secondary | ICD-10-CM | POA: Diagnosis not present

## 2022-12-28 DIAGNOSIS — E785 Hyperlipidemia, unspecified: Secondary | ICD-10-CM | POA: Diagnosis not present

## 2023-01-04 DIAGNOSIS — E785 Hyperlipidemia, unspecified: Secondary | ICD-10-CM | POA: Diagnosis not present

## 2023-01-04 DIAGNOSIS — Z794 Long term (current) use of insulin: Secondary | ICD-10-CM | POA: Diagnosis not present

## 2023-01-04 DIAGNOSIS — E1142 Type 2 diabetes mellitus with diabetic polyneuropathy: Secondary | ICD-10-CM | POA: Diagnosis not present

## 2023-01-04 DIAGNOSIS — N1832 Chronic kidney disease, stage 3b: Secondary | ICD-10-CM | POA: Diagnosis not present

## 2023-01-04 DIAGNOSIS — F331 Major depressive disorder, recurrent, moderate: Secondary | ICD-10-CM | POA: Diagnosis not present

## 2023-01-04 DIAGNOSIS — Z483 Aftercare following surgery for neoplasm: Secondary | ICD-10-CM | POA: Diagnosis not present

## 2023-01-04 DIAGNOSIS — E1122 Type 2 diabetes mellitus with diabetic chronic kidney disease: Secondary | ICD-10-CM | POA: Diagnosis not present

## 2023-01-04 DIAGNOSIS — M1711 Unilateral primary osteoarthritis, right knee: Secondary | ICD-10-CM | POA: Diagnosis not present

## 2023-01-04 DIAGNOSIS — E1151 Type 2 diabetes mellitus with diabetic peripheral angiopathy without gangrene: Secondary | ICD-10-CM | POA: Diagnosis not present

## 2023-01-05 DIAGNOSIS — M1711 Unilateral primary osteoarthritis, right knee: Secondary | ICD-10-CM

## 2023-01-05 DIAGNOSIS — E669 Obesity, unspecified: Secondary | ICD-10-CM

## 2023-01-05 DIAGNOSIS — E1122 Type 2 diabetes mellitus with diabetic chronic kidney disease: Secondary | ICD-10-CM

## 2023-01-05 DIAGNOSIS — E1151 Type 2 diabetes mellitus with diabetic peripheral angiopathy without gangrene: Secondary | ICD-10-CM

## 2023-01-05 DIAGNOSIS — N1832 Chronic kidney disease, stage 3b: Secondary | ICD-10-CM

## 2023-01-05 DIAGNOSIS — F331 Major depressive disorder, recurrent, moderate: Secondary | ICD-10-CM

## 2023-01-05 DIAGNOSIS — E785 Hyperlipidemia, unspecified: Secondary | ICD-10-CM

## 2023-01-05 DIAGNOSIS — M8580 Other specified disorders of bone density and structure, unspecified site: Secondary | ICD-10-CM

## 2023-01-05 DIAGNOSIS — K5900 Constipation, unspecified: Secondary | ICD-10-CM

## 2023-01-05 DIAGNOSIS — Z483 Aftercare following surgery for neoplasm: Secondary | ICD-10-CM

## 2023-01-05 DIAGNOSIS — E1142 Type 2 diabetes mellitus with diabetic polyneuropathy: Secondary | ICD-10-CM

## 2023-01-05 DIAGNOSIS — Z794 Long term (current) use of insulin: Secondary | ICD-10-CM

## 2023-01-07 ENCOUNTER — Telehealth: Payer: Self-pay

## 2023-01-08 DIAGNOSIS — Z483 Aftercare following surgery for neoplasm: Secondary | ICD-10-CM | POA: Diagnosis not present

## 2023-01-08 DIAGNOSIS — E1122 Type 2 diabetes mellitus with diabetic chronic kidney disease: Secondary | ICD-10-CM | POA: Diagnosis not present

## 2023-01-08 DIAGNOSIS — M1711 Unilateral primary osteoarthritis, right knee: Secondary | ICD-10-CM | POA: Diagnosis not present

## 2023-01-08 DIAGNOSIS — E1142 Type 2 diabetes mellitus with diabetic polyneuropathy: Secondary | ICD-10-CM | POA: Diagnosis not present

## 2023-01-08 DIAGNOSIS — Z794 Long term (current) use of insulin: Secondary | ICD-10-CM | POA: Diagnosis not present

## 2023-01-08 DIAGNOSIS — F331 Major depressive disorder, recurrent, moderate: Secondary | ICD-10-CM | POA: Diagnosis not present

## 2023-01-08 DIAGNOSIS — E785 Hyperlipidemia, unspecified: Secondary | ICD-10-CM | POA: Diagnosis not present

## 2023-01-08 DIAGNOSIS — E1151 Type 2 diabetes mellitus with diabetic peripheral angiopathy without gangrene: Secondary | ICD-10-CM | POA: Diagnosis not present

## 2023-01-08 DIAGNOSIS — N1832 Chronic kidney disease, stage 3b: Secondary | ICD-10-CM | POA: Diagnosis not present

## 2023-01-12 ENCOUNTER — Encounter: Payer: Self-pay | Admitting: Obstetrics and Gynecology

## 2023-01-12 ENCOUNTER — Ambulatory Visit: Payer: Medicare HMO | Admitting: Obstetrics and Gynecology

## 2023-01-12 ENCOUNTER — Other Ambulatory Visit (HOSPITAL_COMMUNITY)
Admission: RE | Admit: 2023-01-12 | Discharge: 2023-01-12 | Disposition: A | Discharge status: Home / Self Care | Payer: Medicare HMO | Source: Ambulatory Visit | Attending: Obstetrics and Gynecology | Admitting: Obstetrics and Gynecology

## 2023-01-12 VITALS — BP 135/79 | HR 78 | Ht 65.0 in | Wt 222.8 lb

## 2023-01-12 DIAGNOSIS — N898 Other specified noninflammatory disorders of vagina: Secondary | ICD-10-CM | POA: Diagnosis not present

## 2023-01-12 DIAGNOSIS — N842 Polyp of vagina: Secondary | ICD-10-CM | POA: Insufficient documentation

## 2023-01-12 DIAGNOSIS — Z7689 Persons encountering health services in other specified circumstances: Secondary | ICD-10-CM

## 2023-01-12 NOTE — Progress Notes (Signed)
Patient presents today to discuss possible vaginal mass. She states this has been ongoing for 4 weeks, no longer having any bleeding.

## 2023-01-12 NOTE — Progress Notes (Addendum)
HPI:      [name] is a 72 y.o. G2P2 who LMP was No LMP recorded. Patient has had a hysterectomy.  Subjective:   She presents today because several weeks ago she noted vaginal bleeding.  She was examined and found to have a "polyp".  It was biopsied and the subsequent biopsy showed an endocervical polyp. (Benign).  Patient has had a previous hysterectomy and this included removal of her cervix at that time. She states that she has not had any bleeding since early in September when her biopsy was performed. In the meantime she was found to have 2 benign tumors in her brain and they were removed.  She states that she is doing well after her brain surgery.    Hx: The following portions of the patient's history were reviewed and updated as appropriate:             She  has a past medical history of Brain tumor (benign) (HCC), Chronic kidney disease, Depression, Diabetes mellitus type II, Headache, Hyperlipemia, Major depressive disorder, recurrent, in remission (HCC) (09/01/2006), Multiple fibroadenomata of both breasts, Obesity, Osteopenia, PAD (peripheral artery disease) (HCC), and Syncope (1998). She does not have any pertinent problems on file. She  has a past surgical history that includes Cholecystectomy (1975); Cosmetic surgery; Vaginal delivery; Abdominal hysterectomy (~2005); Breast biopsy (Right, 01/05/2019); Breast biopsy (Right, 01/05/2019); Esophagogastroduodenoscopy (egd) with propofol (N/A, 11/18/2020); Cataract extraction w/ intraocular lens & anterior vitrectomy (Bilateral, 01/2022); Craniotomy (N/A, 12/01/2022); and Application of cranial navigation (N/A, 12/01/2022). Her family history includes Breast cancer (age of onset: 35) in her sister; Cancer in her brother and sister; Heart disease in her brother and father; Stroke in her father. She  reports that she quit smoking about 29 years ago. Her smoking use included cigarettes. She has never been exposed to tobacco smoke. She has  never used smokeless tobacco. She reports that she does not drink alcohol and does not use drugs. She has a current medication list which includes the following prescription(s): onetouch verio flex system, freestyle libre 3 sensor, empagliflozin, gabapentin, glipizide, onetouch verio, hydrocortisone cream, ibuprofen, ibuprofen-diphenhydramine cit, insulin glargine, onetouch ultrasoft, triamcinolone cream, trolamine salicylate, venlafaxine xr, and atorvastatin. She is allergic to onglyza [saxagliptin], semaglutide(0.25 or 0.5mg -dos), liraglutide, and tape.       Review of Systems:  Review of Systems  Constitutional: Denied constitutional symptoms, night sweats, recent illness, fatigue, fever, insomnia and weight loss.  Eyes: Denied eye symptoms, eye pain, photophobia, vision change and visual disturbance.  Ears/Nose/Throat/Neck: Denied ear, nose, throat or neck symptoms, hearing loss, nasal discharge, sinus congestion and sore throat.  Cardiovascular: Denied cardiovascular symptoms, arrhythmia, chest pain/pressure, edema, exercise intolerance, orthopnea and palpitations.  Respiratory: Denied pulmonary symptoms, asthma, pleuritic pain, productive sputum, cough, dyspnea and wheezing.  Gastrointestinal: Denied, gastro-esophageal reflux, melena, nausea and vomiting.  Genitourinary: Denied genitourinary symptoms including symptomatic vaginal discharge, pelvic relaxation issues, and urinary complaints.  Musculoskeletal: Denied musculoskeletal symptoms, stiffness, swelling, muscle weakness and myalgia.  Dermatologic: Denied dermatology symptoms, rash and scar.  Neurologic: Denied neurology symptoms, dizziness, headache, neck pain and syncope.  Psychiatric: Denied psychiatric symptoms, anxiety and depression.  Endocrine: Denied endocrine symptoms including hot flashes and night sweats.   Meds:   Current Outpatient Medications on File Prior to Visit  Medication Sig Dispense Refill   Blood Glucose  Monitoring Suppl (ONETOUCH VERIO FLEX SYSTEM) w/Device KIT Use to check blood sugar 2 times a day. 1 kit 0   Continuous Blood Gluc Sensor (  FREESTYLE LIBRE 3 SENSOR) MISC PLACE 1 SENSOR ON THE SKIN EVERY 14 DAYS 2 each 11   empagliflozin (JARDIANCE) 25 MG TABS tablet Take 1 tablet (25 mg total) by mouth daily before breakfast. 90 tablet 1   gabapentin (NEURONTIN) 300 MG capsule TAKE TWO CAPSULES BY MOUTH EVERYDAY AT BEDTIME 180 capsule 3   glipiZIDE (GLUCOTROL XL) 2.5 MG 24 hr tablet Take 1 tablet (2.5 mg total) by mouth daily with breakfast. 90 tablet 3   glucose blood (ONETOUCH VERIO) test strip Use as instructed to check blood sugar 2 times a day. 200 each 12   hydrocortisone cream 1 % Apply 1 Application topically daily as needed for itching. 30 g 5   ibuprofen (ADVIL) 400 MG tablet Take 400 mg by mouth every 6 (six) hours as needed.     Ibuprofen-diphenhydrAMINE Cit (ADVIL PM PO) Take 1 tablet by mouth at bedtime as needed.     insulin glargine (LANTUS) 100 UNIT/ML injection Inject 0.35 mLs (35 Units total) into the skin 2 (two) times daily. (Patient taking differently: Inject 35 Units into the skin 2 (two) times daily. 35 am , 30 pm) 20 mL 12   Lancets (ONETOUCH ULTRASOFT) lancets Use as instructed to check blood sugar 2 times a day. 200 each 12   triamcinolone cream (KENALOG) 0.1 % Apply TO THE affected area(s) TOPICALLY twice daily AS NEEDED 45 g 0   trolamine salicylate (ASPERCREME) 10 % cream Apply 1 Application topically as needed for muscle pain. Apply to feet 85 g 5   venlafaxine XR (EFFEXOR-XR) 150 MG 24 hr capsule TAKE ONE CAPSULE BY MOUTH EVERY MORNING and TAKE ONE CAPSULE BY MOUTH EVERYDAY AT BEDTIME 180 capsule 3   atorvastatin (LIPITOR) 20 MG tablet TAKE ONE TABLET BY MOUTH ONCE WEEKLY ON WEDNESDAY and TAKE ONE TABLET BY MOUTH ONCE WEEKLY ON SUNDAY (Patient not taking: Reported on 12/14/2022) 26 tablet 3   No current facility-administered medications on file prior to visit.       Objective:     Vitals:   01/12/23 1017  BP: 135/79  Pulse: 78   Filed Weights   01/12/23 1017  Weight: 222 lb 12.8 oz (101.1 kg)              Physical examination   Pelvic:   Vulva: Normal appearance.    Vagina: No lesions or abnormalities noted.  Small polyp noted at the endocervical cuff. 1x1cm friable  Support: Normal pelvic support.  Urethra No masses tenderness or scarring.  Meatus Normal size without lesions or prolapse.  Cervix: Surgically absent  Anus: Normal exam.  No lesions.  Perineum: Normal exam.  No lesions.           Polypectomy performed using ring forceps  Silver nitrate applied -hemostasis noted.  Assessment:    G2P2 Patient Active Problem List   Diagnosis Date Noted   Brain tumor (benign) (HCC) 12/01/2022   Brain tumor (HCC) 12/01/2022   Postmenopausal bleeding 11/24/2022   Vaginal mass 11/24/2022   Hearing loss secondary to cerumen impaction, bilateral 09/22/2022   Constipation 06/30/2022   PAD (peripheral artery disease) (HCC) 05/26/2022   Meningioma (HCC) 07/10/2021   Edema 12/10/2020   Stage 3b chronic kidney disease (HCC) 11/05/2020   Memory loss 09/09/2020   Multiple fibroadenomata of both breasts 04/24/2019   MDD (major depressive disorder), recurrent episode, moderate (HCC) 08/09/2017   Osteopenia    Osteoarthritis of right knee 12/13/2016   Advance directive discussed with patient 12/13/2016  Type 2 diabetes, controlled, with peripheral neuropathy (HCC) 02/15/2012   Routine general medical examination at a health care facility 05/03/2011   Hyperlipemia 09/01/2006     1. Establishing care with new doctor, encounter for   2. Vaginal mass   3. Vaginal polyp     Likely vaginal cuff polyp -benign   Plan:            1.  Polypectomy performed-silver nitrate applied.  I expect patient to have no further issues from this. Orders No orders of the defined types were placed in this encounter.   No orders of the defined  types were placed in this encounter.     F/U  Return for Annual Physical.  Elonda Husky, M.D. 01/12/2023 10:58 AM

## 2023-01-13 ENCOUNTER — Ambulatory Visit: Payer: Medicare HMO | Admitting: Neurosurgery

## 2023-01-13 ENCOUNTER — Encounter: Payer: Self-pay | Admitting: Neurosurgery

## 2023-01-13 VITALS — BP 132/70 | Ht 65.0 in | Wt 222.0 lb

## 2023-01-13 DIAGNOSIS — D329 Benign neoplasm of meninges, unspecified: Secondary | ICD-10-CM | POA: Diagnosis not present

## 2023-01-13 DIAGNOSIS — Z09 Encounter for follow-up examination after completed treatment for conditions other than malignant neoplasm: Secondary | ICD-10-CM

## 2023-01-13 LAB — SURGICAL PATHOLOGY

## 2023-01-13 NOTE — Progress Notes (Signed)
   REFERRING PHYSICIAN:  No referring provider defined for this encounter.  DOS: 12/01/22 right frontal craniotomy for tumor resection. Pathology: Meningioma.  Final grading pending.  HISTORY OF PRESENT ILLNESS: Jacqueline Cole is status post right crani for tumor. Overall, she is doing very well post-op.  Her thinking has improved.  She is doing much better than prior to surgery.  She is very happy with her improvements.    PHYSICAL EXAMINATION:  NEUROLOGICAL:  General: In no acute distress.   Awake, alert, oriented to person, place, and time.  Pupils equal round and reactive to light.  Facial tone is symmetric.  Tongue protrusion is midline.  There is no pronator drift.  Strength: 5/5 throughout Incision c/d/I  Imaging:  none  Assessment / Plan: Jacqueline Cole is doing well after craniotomy.  She has a grade 1 meningioma.  We will obtain an MRI scan in approximately 6 to 8 weeks.  If that scan does not show residual tumor, we will move to annual scans.  If there is any small residual, we will perform a couple of short interval scans before moving to annual scans.    Venetia Night Dept of Neurosurgery

## 2023-01-14 DIAGNOSIS — N1832 Chronic kidney disease, stage 3b: Secondary | ICD-10-CM | POA: Diagnosis not present

## 2023-01-14 DIAGNOSIS — E1151 Type 2 diabetes mellitus with diabetic peripheral angiopathy without gangrene: Secondary | ICD-10-CM | POA: Diagnosis not present

## 2023-01-14 DIAGNOSIS — E785 Hyperlipidemia, unspecified: Secondary | ICD-10-CM | POA: Diagnosis not present

## 2023-01-14 DIAGNOSIS — Z483 Aftercare following surgery for neoplasm: Secondary | ICD-10-CM | POA: Diagnosis not present

## 2023-01-14 DIAGNOSIS — E1142 Type 2 diabetes mellitus with diabetic polyneuropathy: Secondary | ICD-10-CM | POA: Diagnosis not present

## 2023-01-14 DIAGNOSIS — Z794 Long term (current) use of insulin: Secondary | ICD-10-CM | POA: Diagnosis not present

## 2023-01-14 DIAGNOSIS — E1122 Type 2 diabetes mellitus with diabetic chronic kidney disease: Secondary | ICD-10-CM | POA: Diagnosis not present

## 2023-01-14 DIAGNOSIS — M1711 Unilateral primary osteoarthritis, right knee: Secondary | ICD-10-CM | POA: Diagnosis not present

## 2023-01-14 DIAGNOSIS — F331 Major depressive disorder, recurrent, moderate: Secondary | ICD-10-CM | POA: Diagnosis not present

## 2023-01-17 ENCOUNTER — Telehealth: Payer: Self-pay

## 2023-01-25 ENCOUNTER — Ambulatory Visit: Payer: Medicare HMO | Admitting: Internal Medicine

## 2023-01-25 ENCOUNTER — Telehealth: Payer: Self-pay | Admitting: Internal Medicine

## 2023-01-25 DIAGNOSIS — E1142 Type 2 diabetes mellitus with diabetic polyneuropathy: Secondary | ICD-10-CM

## 2023-01-25 MED ORDER — FREESTYLE LIBRE 3 PLUS SENSOR MISC
3 refills | Status: DC
Start: 2023-01-25 — End: 2023-05-18

## 2023-01-25 NOTE — Addendum Note (Signed)
Addended by: Berenice Primas R on: 01/25/2023 11:43 AM   Modules accepted: Orders

## 2023-01-25 NOTE — Telephone Encounter (Signed)
Patient is requesting a call back when able, says she has some questions regarding her freestyle libre sensor and switching to another cgm. Can be reached best at home number

## 2023-01-25 NOTE — Addendum Note (Signed)
Addended by: Tillman Abide I on: 01/25/2023 01:41 PM   Modules accepted: Orders

## 2023-01-26 ENCOUNTER — Inpatient Hospital Stay: Payer: Medicare HMO

## 2023-01-26 ENCOUNTER — Telehealth: Payer: Self-pay | Admitting: Obstetrics and Gynecology

## 2023-01-26 NOTE — Telephone Encounter (Signed)
Jacqueline Cole called and spoke with our answering service. She left as message to cancel her GYN/ONC appt today @930  am. Team was notified.

## 2023-01-26 NOTE — Progress Notes (Deleted)
Gynecologic Oncology Interval Visit   Referring Provider: Dr Apolinar Junes  Chief Concern: vaginal bleeding and mass.  Subjective:  Jacqueline Cole is a 72 y.o. P2 female who is seen in consultation from Dr. Alphonsus Sias for vaginal bleeding and mass.    Vaginal biopsy 12/2022 A. VAGINAL CUFF, POLYPECTOMY:  - Benign fibroepithelial polyp   11/24/2022 Pelvic: exam chaperoned by nurse;  Vulva: atrophic; Vagina: large 2 cm fleshy polyp anterior upper vagina with small stalk, and another smaller nodule on the mucosa, both look benign; Unclear if cervix still present.  Adnexa: negative Procedure note: Patient identified and signed informed consent with husband present.  Polyp and adjacent nodule biopsied off and sent to path, and silver nitrate applied to stop minimal bleeding.  She had some discomfort, but tolerated well.  No further bleeding noted after 5 minutes.   Plan: She can RTC in 2 months and we can hopefully better ascertain if she has a residual cervix and perhaps do a PAP smear.    Gynecologic Oncology History Jacqueline Cole is a pleasant P2 female who is seen in consultation from Dr. Alphonsus Sias for vaginal bleeding and mass.    She was in the emergency room on 11/07/2022 after she noticed some blood in her urine for the first time. Her urinalysis was grossly positive. She was prescribed Keflex 3 times a day. She ended up growing staphylococcus haemolyticus, only 10,000 colonies. PVR is 25 mL.   She reports a recent episode of heavy vaginal bleeding, which she initially mistook for a urinary issue. She has a history of a complete hysterectomy in her 30s for bleeding with Dr Barnabas Lister. The bleeding has since resolved after completing the antibiotic course, but she did notice large clots during the episode.  Saw Dr. Apolinar Junes 11/19/22 and vaginal mass noted.  Granulomatous mass at the anterior apex of the vagina, friable in appearance ~ 2 cm. Question on if she had a cervix or not but the location where one  would expect, there was a fungating mass that had a friable appearance.    She has a personal history of some confusion related to a brain tumor.  She has also been recently diagnosed with a large 6 cm meningioma for which she is scheduled to undergo surgery in the next week with Dr. Myer Haff.  Problem List: Patient Active Problem List   Diagnosis Date Noted   Brain tumor (benign) (HCC) 12/01/2022   Brain tumor (HCC) 12/01/2022   Postmenopausal bleeding 11/24/2022   Vaginal mass 11/24/2022   Hearing loss secondary to cerumen impaction, bilateral 09/22/2022   Constipation 06/30/2022   PAD (peripheral artery disease) (HCC) 05/26/2022   Meningioma (HCC) 07/10/2021   Edema 12/10/2020   Stage 3b chronic kidney disease (HCC) 11/05/2020   Memory loss 09/09/2020   Multiple fibroadenomata of both breasts 04/24/2019   MDD (major depressive disorder), recurrent episode, moderate (HCC) 08/09/2017   Osteopenia    Osteoarthritis of right knee 12/13/2016   Advance directive discussed with patient 12/13/2016   Type 2 diabetes, controlled, with peripheral neuropathy (HCC) 02/15/2012   Routine general medical examination at a health care facility 05/03/2011   Hyperlipemia 09/01/2006    Past Medical History: Past Medical History:  Diagnosis Date   Brain tumor (benign) (HCC)    Chronic kidney disease    stage 3   Depression    Diabetes mellitus type II    Headache    brain tumor   Hyperlipemia    Major depressive disorder, recurrent,  in remission (HCC) 09/01/2006   Qualifier: Diagnosis of  By: [name]     Multiple fibroadenomata of both breasts    Obesity    Osteopenia    PAD (peripheral artery disease) (HCC)    on screening   Syncope 1998   DM diagnosis    Past Surgical History: Past Surgical History:  Procedure Laterality Date   ABDOMINAL HYSTERECTOMY  ~2005   and BSO   APPLICATION OF CRANIAL NAVIGATION N/A 12/01/2022   Procedure: APPLICATION OF CRANIAL NAVIGATION;   Surgeon: Venetia Night, MD;  Location: [facility] ORS;  Service: Neurosurgery;  Laterality: N/A;   BREAST BIOPSY Right 01/05/2019   rt stereo bx 2 areas 1st area ribbon clip   BREAST BIOPSY Right 01/05/2019   rt stereo bx 2nd area coil clip   CATARACT EXTRACTION W/ INTRAOCULAR LENS & ANTERIOR VITRECTOMY Bilateral 01/2022   CHOLECYSTECTOMY  1975   COSMETIC SURGERY     Injury Right face as a child   CRANIOTOMY N/A 12/01/2022   Procedure: RIGHT FRONTAL CRANIOTOMY FOR TUMOR EXCISION;  Surgeon: Venetia Night, MD;  Location: [facility] ORS;  Service: Neurosurgery;  Laterality: N/A;   ESOPHAGOGASTRODUODENOSCOPY (EGD) WITH PROPOFOL N/A 11/18/2020   Procedure: ESOPHAGOGASTRODUODENOSCOPY (EGD) WITH PROPOFOL;  Surgeon: Wyline Mood, MD;  Location: [facility];  Service: Gastroenterology;  Laterality: N/A;   VAGINAL DELIVERY     X 2    Family History: Family History  Problem Relation Age of Onset   Stroke Father        CVA   Heart disease Father        MI   Cancer Sister        breast cancer   Breast cancer Sister 24   Heart disease Brother        MI   Cancer Brother        Lung    Social History: Social History   Socioeconomic History   Marital status: Married    Spouse name: Not on file   Number of children: 2   Years of education: Not on file   Highest education level: Not on file  Occupational History    Comment: "Just up and quit" at [employer]  Tobacco Use   Smoking status: Former    Current packs/day: 0.00    Types: Cigarettes    Quit date: 03/15/1993    Years since quitting: 29.8    Passive exposure: Never   Smokeless tobacco: Never  Vaping Use   Vaping status: Never Used  Substance and Sexual Activity   Alcohol use: No    Alcohol/week: 0.0 standard drinks of alcohol   Drug use: No   Sexual activity: Not Currently    Birth control/protection: Surgical  Other Topics Concern   Not on file  Social History Narrative   Has living will   Husband, then daughter Arlana Hove,  have health care POA   Would reluctantly accept resuscitation but no life prolonging measures   Social Determinants of Health   Financial Resource Strain: Low Risk  (04/03/2020)   Overall Financial Resource Strain (CARDIA)    Difficulty of Paying Living Expenses: Not very hard  Food Insecurity: No Food Insecurity (12/01/2022)   Hunger Vital Sign    Worried About Running Out of Food in the Last Year: Never true    Ran Out of Food in the Last Year: Never true  Transportation Needs: No Transportation Needs (12/01/2022)   PRAPARE - Administrator, Civil Service (Medical): No  Lack of Transportation (Non-Medical): No  Physical Activity: Not on file  Stress: Not on file  Social Connections: Not on file  Intimate Partner Violence: Not At Risk (12/01/2022)   Humiliation, Afraid, Rape, and Kick questionnaire    Fear of Current or Ex-Partner: No    Emotionally Abused: No    Physically Abused: No    Sexually Abused: No    Allergies: Allergies  Allergen Reactions   Onglyza [Saxagliptin] Hives and Nausea And Vomiting   Semaglutide(0.25 Or 0.5mg -Dos) Nausea And Vomiting and Other (See Comments)    Extreme dehydration   Liraglutide Nausea Only    Can't eat, nausea, higher sugars   Tape Other (See Comments)    Thin skin     Current Medications: Current Outpatient Medications  Medication Sig Dispense Refill   atorvastatin (LIPITOR) 20 MG tablet TAKE ONE TABLET BY MOUTH ONCE WEEKLY ON WEDNESDAY and TAKE ONE TABLET BY MOUTH ONCE WEEKLY ON SUNDAY 26 tablet 3   Blood Glucose Monitoring Suppl (ONETOUCH VERIO FLEX SYSTEM) w/Device KIT Use to check blood sugar 2 times a day. 1 kit 0   Continuous Glucose Sensor (FREESTYLE LIBRE 3 PLUS SENSOR) MISC Change sensor every 15 days. 6 each 3   empagliflozin (JARDIANCE) 25 MG TABS tablet Take 1 tablet (25 mg total) by mouth daily before breakfast. 90 tablet 1   gabapentin (NEURONTIN) 300 MG capsule TAKE TWO CAPSULES BY MOUTH EVERYDAY AT BEDTIME  180 capsule 3   glipiZIDE (GLUCOTROL XL) 2.5 MG 24 hr tablet Take 1 tablet (2.5 mg total) by mouth daily with breakfast. 90 tablet 3   glucose blood (ONETOUCH VERIO) test strip Use as instructed to check blood sugar 2 times a day. 200 each 12   hydrocortisone cream 1 % Apply 1 Application topically daily as needed for itching. 30 g 5   ibuprofen (ADVIL) 400 MG tablet Take 400 mg by mouth every 6 (six) hours as needed.     Ibuprofen-diphenhydrAMINE Cit (ADVIL PM PO) Take 1 tablet by mouth at bedtime as needed.     insulin glargine (LANTUS) 100 UNIT/ML injection Inject 0.35 mLs (35 Units total) into the skin 2 (two) times daily. (Patient taking differently: Inject 35 Units into the skin 2 (two) times daily. 35 am , 30 pm) 20 mL 12   Lancets (ONETOUCH ULTRASOFT) lancets Use as instructed to check blood sugar 2 times a day. 200 each 12   triamcinolone cream (KENALOG) 0.1 % Apply TO THE affected area(s) TOPICALLY twice daily AS NEEDED 45 g 0   trolamine salicylate (ASPERCREME) 10 % cream Apply 1 Application topically as needed for muscle pain. Apply to feet 85 g 5   venlafaxine XR (EFFEXOR-XR) 150 MG 24 hr capsule TAKE ONE CAPSULE BY MOUTH EVERY MORNING and TAKE ONE CAPSULE BY MOUTH EVERYDAY AT BEDTIME 180 capsule 3   No current facility-administered medications for this visit.    Review of Systems ***General: no complaints  HEENT: no complaints  Lungs: no complaints  Cardiac: no complaints  GI: no complaints  GU: no complaints  Musculoskeletal: no complaints  Extremities: no complaints  Skin: no complaints  Neuro: no complaints  Endocrine: no complaints  Psych: no complaints       Objective:  Physical Examination:  There were no vitals taken for this visit.   ECOG Performance Status: 1 - Symptomatic but completely ambulatory *** Pelvic: exam chaperoned by nurse;  Vulva: atrophic; Vagina: large 2 cm fleshy polyp anterior upper vagina with small stalk, and  another smaller nodule on the  mucosa, both look benign; Unclear if cervix still present.  Adnexa: negative     Lab Review Labs on site today:   Chemistry      Component Value Date/Time   NA 139 12/03/2022 0727   K 4.1 12/03/2022 0727   CL 102 12/03/2022 0727   CO2 25 12/03/2022 0727   BUN 26 (H) 12/03/2022 0727   CREATININE 1.12 (H) 12/03/2022 0727      Component Value Date/Time   CALCIUM 9.1 12/03/2022 0727   ALKPHOS 111 06/30/2022 0829   AST 18 06/30/2022 0829   ALT 14 06/30/2022 0829   BILITOT 0.9 06/30/2022 0829      Lab Results  Component Value Date   WBC 15.8 (H) 12/03/2022   HGB 12.5 12/03/2022   HCT 38.8 12/03/2022   MCV 89.2 12/03/2022   PLT 228 12/03/2022   Hgb A1C 10/25/22 = 8.5  Radiologic Imaging: Brain MRI 11/04/22 IMPRESSION: 1. 6.0 x 4.5 x 4.1 cm dural-based mass at the right frontal convexity, significantly increased in size from the prior MRI of 11/16/2020 and most consistent with a meningioma. Progressive mass effect upon the underlying brain parenchyma now with mild underlying parenchymal edema. 8 mm leftward midline shift is new from the prior exam, and there is now a degree of leftward subfalcine herniation. Neurosurgical consultation is recommended. 2. Background moderate chronic small vessel ischemic changes within the cerebral white matter and pons, similar to the prior MRI.    Assessment:  ILHAN TRETO is a 72 y.o. female diagnosed with vaginal bleeding due to benign appearing vaginal/cervical polyp. There was also an adjacent nodule that also looked benign.  She had a hysterectomy in her 56s with Dr Barnabas Lister, and suspect she may have had supracervical procedure and that these are endocervical polyps.      Medical co-morbidities complicating care: diabetes type 2, probable meningioma right frontal lobe causing mental status changes.   Plan:   Problem List Items Addressed This Visit   None   *** We discussed findings and will await path report on vaginal  biopsies to confirm benign etiology.  She can RTC in 2 months and we can hopefully better ascertain if she has a residual cervix and perhaps do a PAP smear.    She will go ahead with surgery next week to remove 6 cm brain tumor that is thought to be benign, but causing mental status changes.   The patient's diagnosis, an outline of the further diagnostic and laboratory studies which will be required, the recommendation, and alternatives were discussed.  All questions were answered to the patient's satisfaction.  A total of 45 minutes were spent with the patient/family today; 50% was spent in education, counseling and coordination of care for vaginal bleeding due to polyps.      Daeton Kluth Leta Jungling, MD  CC:  Karie Schwalbe, MD [address] [place],  [place] [address] [phone]

## 2023-01-27 ENCOUNTER — Encounter: Payer: Self-pay | Admitting: Pharmacist

## 2023-01-27 NOTE — Progress Notes (Signed)
Called patient in regard to her question. Called into clinic asking to speak about CGM.   She has been previously prescribed Libre 3 CGM which is on backorder. Josephine Igo is phasing out [place] 3 in favor of the newer Libre 3 Plus sensors.   Preemptively sent refill to pharmacy for [facility] 3 Plus sensors. Pharmacist confirmed receipt of the prescription and states that they have been able to get [facility] 3 Plus sensors ordered.   Left message for patient to return my call to discuss.   Loree Fee, PharmD Clinical Pharmacist [facility] [phone]

## 2023-01-27 NOTE — Telephone Encounter (Signed)
Patient's husband returned call, I advised of this message. Husband states patient would like to speak with Mardella Layman when able regarding the app with the [place] sensors.

## 2023-01-28 ENCOUNTER — Other Ambulatory Visit: Payer: Self-pay | Admitting: Pharmacist

## 2023-01-28 NOTE — Progress Notes (Signed)
Returned patient call regarding request to speak to pharmacist about CGM.   Patient's husband states that the app somehow got deleted off of patient's phone and they do not know how to get it back.   Assisted patient with [facility] 3 app download. We also ensured that patient was logged back in successfully during our phone call.   Loree Fee, PharmD Clinical Pharmacist [facility] [phone]

## 2023-02-02 ENCOUNTER — Ambulatory Visit: Payer: Medicare HMO

## 2023-02-17 NOTE — Addendum Note (Signed)
Addended by: Ernie Hew on: 02/17/2023 12:35 PM   Modules accepted: Orders

## 2023-02-22 ENCOUNTER — Encounter: Payer: Medicare HMO | Admitting: Neurosurgery

## 2023-02-24 ENCOUNTER — Ambulatory Visit
Admission: RE | Admit: 2023-02-24 | Discharge: 2023-02-24 | Disposition: A | Discharge status: Home / Self Care | Payer: Medicare HMO | Source: Ambulatory Visit | Attending: Neurosurgery | Admitting: Neurosurgery

## 2023-02-24 DIAGNOSIS — D329 Benign neoplasm of meninges, unspecified: Secondary | ICD-10-CM | POA: Insufficient documentation

## 2023-02-24 MED ORDER — GADOBUTROL 1 MMOL/ML IV SOLN
10.0000 mL | Freq: Once | INTRAVENOUS | Status: AC | PRN
  Administered 2023-02-24: 10 mL via INTRAVENOUS

## 2023-03-11 ENCOUNTER — Other Ambulatory Visit: Payer: PRIVATE HEALTH INSURANCE

## 2023-03-21 ENCOUNTER — Ambulatory Visit: Payer: Medicare HMO | Admitting: Internal Medicine

## 2023-03-22 ENCOUNTER — Ambulatory Visit: Payer: Medicare HMO | Admitting: Neurosurgery

## 2023-03-22 ENCOUNTER — Encounter: Payer: Self-pay | Admitting: Neurosurgery

## 2023-03-22 VITALS — BP 124/78 | Ht 65.0 in | Wt 222.0 lb

## 2023-03-22 DIAGNOSIS — D329 Benign neoplasm of meninges, unspecified: Secondary | ICD-10-CM

## 2023-03-22 DIAGNOSIS — Z7689 Persons encountering health services in other specified circumstances: Secondary | ICD-10-CM | POA: Diagnosis not present

## 2023-03-22 DIAGNOSIS — D496 Neoplasm of unspecified behavior of brain: Secondary | ICD-10-CM

## 2023-03-22 NOTE — Progress Notes (Signed)
   REFERRING PHYSICIAN:  No referring provider defined for this encounter.  DOS: 12/01/22 right frontal craniotomy for tumor resection. Pathology: Meningioma.  Final grading pending.  HISTORY OF PRESENT ILLNESS:  03/22/23  Jacqueline Cole is a 73 year old presenting today for 66-month follow-up craniotomy for tumor resection.  Her most recent MRI scan was done on 02/24/2023 which shows good resection.  She is experiencing some diffuse dryness in her skin since surgery however she has not had any new medications or environmental exposures.  She is requesting referral to primary care to establish with a new doctor.  01/13/23 Jacqueline Cole is status post right crani for tumor. Overall, she is doing very well post-op.  Her thinking has improved.  She is doing much better than prior to surgery.  She is very happy with her improvements.    PHYSICAL EXAMINATION:  NEUROLOGICAL:  General: In no acute distress.   Awake, alert, oriented to person, place, and time.  Pupils equal round and reactive to light.  Facial tone is symmetric.  Tongue protrusion is midline.  There is no pronator drift.  Strength: 5/5 throughout Incision c/d/I  Imaging:  02/24/23 MRI brain with and without IMPRESSION: 1. Gross total resection of large right superior frontal convexity meningioma. Postoperative sequelae with resolved intracranial mass effect. Combined postoperative and chronic/residual regional dural thickening. 2. Stable brain elsewhere, no new intracranial abnormality.     Electronically Signed   By: VEAR Hurst M.D.   On: 03/09/2023 06:18  Assessment / Plan: Jacqueline Cole is doing well after craniotomy.  She has a grade 1 meningioma.  Her most recent MRI scan is reassuring.  We will see her back in 1 year for repeat MRI with and without contrast or sooner should he have any questions or concerns.  I did place a referral to Dr. Oneil Pinal at Dr. Elliot recommendation to establish care for primary  care.  Edsel Goods PA-C Dept of Neurosurgery

## 2023-03-30 ENCOUNTER — Inpatient Hospital Stay: Payer: PRIVATE HEALTH INSURANCE

## 2023-04-13 ENCOUNTER — Inpatient Hospital Stay: Payer: Medicare HMO

## 2023-04-13 ENCOUNTER — Inpatient Hospital Stay
Admission: EM | Admit: 2023-04-13 | Discharge: 2023-04-13 | DRG: 871 | Discharge status: Against Medical Advice | Payer: Medicare HMO | Attending: Internal Medicine | Admitting: Internal Medicine

## 2023-04-13 ENCOUNTER — Emergency Department: Payer: Medicare HMO

## 2023-04-13 ENCOUNTER — Other Ambulatory Visit: Payer: Self-pay

## 2023-04-13 DIAGNOSIS — G9389 Other specified disorders of brain: Secondary | ICD-10-CM | POA: Diagnosis not present

## 2023-04-13 DIAGNOSIS — G9341 Metabolic encephalopathy: Secondary | ICD-10-CM | POA: Diagnosis not present

## 2023-04-13 DIAGNOSIS — Z1152 Encounter for screening for COVID-19: Secondary | ICD-10-CM

## 2023-04-13 DIAGNOSIS — N3000 Acute cystitis without hematuria: Secondary | ICD-10-CM

## 2023-04-13 DIAGNOSIS — J9811 Atelectasis: Secondary | ICD-10-CM | POA: Diagnosis not present

## 2023-04-13 DIAGNOSIS — Z7984 Long term (current) use of oral hypoglycemic drugs: Secondary | ICD-10-CM | POA: Diagnosis not present

## 2023-04-13 DIAGNOSIS — K5730 Diverticulosis of large intestine without perforation or abscess without bleeding: Secondary | ICD-10-CM | POA: Diagnosis not present

## 2023-04-13 DIAGNOSIS — Z87891 Personal history of nicotine dependence: Secondary | ICD-10-CM | POA: Diagnosis not present

## 2023-04-13 DIAGNOSIS — W06XXXA Fall from bed, initial encounter: Secondary | ICD-10-CM | POA: Diagnosis present

## 2023-04-13 DIAGNOSIS — Z5329 Procedure and treatment not carried out because of patient's decision for other reasons: Secondary | ICD-10-CM | POA: Diagnosis not present

## 2023-04-13 DIAGNOSIS — R4781 Slurred speech: Secondary | ICD-10-CM | POA: Diagnosis present

## 2023-04-13 DIAGNOSIS — N1832 Chronic kidney disease, stage 3b: Secondary | ICD-10-CM | POA: Diagnosis not present

## 2023-04-13 DIAGNOSIS — R519 Headache, unspecified: Secondary | ICD-10-CM | POA: Diagnosis not present

## 2023-04-13 DIAGNOSIS — Z6836 Body mass index (BMI) 36.0-36.9, adult: Secondary | ICD-10-CM | POA: Diagnosis not present

## 2023-04-13 DIAGNOSIS — Z79899 Other long term (current) drug therapy: Secondary | ICD-10-CM

## 2023-04-13 DIAGNOSIS — R4182 Altered mental status, unspecified: Secondary | ICD-10-CM | POA: Diagnosis not present

## 2023-04-13 DIAGNOSIS — R109 Unspecified abdominal pain: Secondary | ICD-10-CM | POA: Diagnosis not present

## 2023-04-13 DIAGNOSIS — I700 Atherosclerosis of aorta: Secondary | ICD-10-CM | POA: Diagnosis not present

## 2023-04-13 DIAGNOSIS — F331 Major depressive disorder, recurrent, moderate: Secondary | ICD-10-CM | POA: Diagnosis present

## 2023-04-13 DIAGNOSIS — A419 Sepsis, unspecified organism: Principal | ICD-10-CM | POA: Diagnosis present

## 2023-04-13 DIAGNOSIS — R29898 Other symptoms and signs involving the musculoskeletal system: Secondary | ICD-10-CM | POA: Diagnosis not present

## 2023-04-13 DIAGNOSIS — R6520 Severe sepsis without septic shock: Secondary | ICD-10-CM | POA: Diagnosis present

## 2023-04-13 DIAGNOSIS — Z803 Family history of malignant neoplasm of breast: Secondary | ICD-10-CM | POA: Diagnosis not present

## 2023-04-13 DIAGNOSIS — Z91048 Other nonmedicinal substance allergy status: Secondary | ICD-10-CM

## 2023-04-13 DIAGNOSIS — E669 Obesity, unspecified: Secondary | ICD-10-CM | POA: Diagnosis present

## 2023-04-13 DIAGNOSIS — Z8249 Family history of ischemic heart disease and other diseases of the circulatory system: Secondary | ICD-10-CM

## 2023-04-13 DIAGNOSIS — N390 Urinary tract infection, site not specified: Secondary | ICD-10-CM | POA: Diagnosis present

## 2023-04-13 DIAGNOSIS — E785 Hyperlipidemia, unspecified: Secondary | ICD-10-CM | POA: Diagnosis not present

## 2023-04-13 DIAGNOSIS — Z86011 Personal history of benign neoplasm of the brain: Secondary | ICD-10-CM

## 2023-04-13 DIAGNOSIS — R059 Cough, unspecified: Secondary | ICD-10-CM | POA: Diagnosis not present

## 2023-04-13 DIAGNOSIS — Z794 Long term (current) use of insulin: Secondary | ICD-10-CM

## 2023-04-13 DIAGNOSIS — K449 Diaphragmatic hernia without obstruction or gangrene: Secondary | ICD-10-CM | POA: Diagnosis not present

## 2023-04-13 DIAGNOSIS — Z90710 Acquired absence of both cervix and uterus: Secondary | ICD-10-CM | POA: Diagnosis not present

## 2023-04-13 DIAGNOSIS — E1129 Type 2 diabetes mellitus with other diabetic kidney complication: Secondary | ICD-10-CM | POA: Diagnosis present

## 2023-04-13 DIAGNOSIS — E1151 Type 2 diabetes mellitus with diabetic peripheral angiopathy without gangrene: Secondary | ICD-10-CM | POA: Diagnosis present

## 2023-04-13 DIAGNOSIS — Z888 Allergy status to other drugs, medicaments and biological substances status: Secondary | ICD-10-CM

## 2023-04-13 DIAGNOSIS — E1122 Type 2 diabetes mellitus with diabetic chronic kidney disease: Secondary | ICD-10-CM | POA: Diagnosis present

## 2023-04-13 DIAGNOSIS — Z823 Family history of stroke: Secondary | ICD-10-CM | POA: Diagnosis not present

## 2023-04-13 DIAGNOSIS — M8580 Other specified disorders of bone density and structure, unspecified site: Secondary | ICD-10-CM | POA: Diagnosis present

## 2023-04-13 DIAGNOSIS — E66812 Obesity, class 2: Secondary | ICD-10-CM | POA: Diagnosis present

## 2023-04-13 DIAGNOSIS — I10 Essential (primary) hypertension: Secondary | ICD-10-CM | POA: Diagnosis not present

## 2023-04-13 DIAGNOSIS — G9340 Encephalopathy, unspecified: Secondary | ICD-10-CM | POA: Diagnosis not present

## 2023-04-13 LAB — URINALYSIS, ROUTINE W REFLEX MICROSCOPIC
Bilirubin Urine: NEGATIVE
Glucose, UA: >=500 mg/dL — AB
Ketones, ur: NEGATIVE mg/dL
Nitrite: POSITIVE — AB
Protein, ur: NEGATIVE mg/dL
Specific Gravity, Urine: 1.01 (ref 1.005–1.030)
WBC, UA: >50 WBC/hpf (ref 0–5)
pH: 5 (ref 5.0–8.0)

## 2023-04-13 LAB — DIFFERENTIAL
Abs Immature Granulocytes: 0.04 10*3/uL (ref 0.00–0.07)
Basophils Absolute: 0 10*3/uL (ref 0.0–0.1)
Basophils Relative: 0 %
Eosinophils Absolute: 0 10*3/uL (ref 0.0–0.5)
Eosinophils Relative: 0 %
Immature Granulocytes: 0 %
Lymphocytes Relative: 8 %
Lymphs Abs: 0.8 10*3/uL (ref 0.7–4.0)
Monocytes Absolute: 0.8 10*3/uL (ref 0.1–1.0)
Monocytes Relative: 8 %
Neutro Abs: 7.8 10*3/uL — ABNORMAL HIGH (ref 1.7–7.7)
Neutrophils Relative %: 84 %

## 2023-04-13 LAB — CBC
HCT: 42.1 % (ref 36.0–46.0)
Hemoglobin: 14 g/dL (ref 12.0–15.0)
MCH: 28 pg (ref 26.0–34.0)
MCHC: 33.3 g/dL (ref 30.0–36.0)
MCV: 84.2 fL (ref 80.0–100.0)
Platelets: 195 10*3/uL (ref 150–400)
RBC: 5 MIL/uL (ref 3.87–5.11)
RDW: 15.2 % (ref 11.5–15.5)
WBC: 9.5 10*3/uL (ref 4.0–10.5)
nRBC: 0 % (ref 0.0–0.2)

## 2023-04-13 LAB — APTT: aPTT: 34 s (ref 24–36)

## 2023-04-13 LAB — COMPREHENSIVE METABOLIC PANEL
ALT: 30 U/L (ref 0–44)
AST: 46 U/L — ABNORMAL HIGH (ref 15–41)
Albumin: 3.7 g/dL (ref 3.5–5.0)
Alkaline Phosphatase: 124 U/L (ref 38–126)
Anion gap: 12 (ref 5–15)
BUN: 15 mg/dL (ref 8–23)
CO2: 22 mmol/L (ref 22–32)
Calcium: 9 mg/dL (ref 8.9–10.3)
Chloride: 101 mmol/L (ref 98–111)
Creatinine, Ser: 1.48 mg/dL — ABNORMAL HIGH (ref 0.44–1.00)
GFR, Estimated: 38 mL/min — ABNORMAL LOW (ref >60)
Glucose, Bld: 110 mg/dL — ABNORMAL HIGH (ref 70–99)
Potassium: 3.5 mmol/L (ref 3.5–5.1)
Sodium: 135 mmol/L (ref 135–145)
Total Bilirubin: 1.7 mg/dL — ABNORMAL HIGH (ref 0.0–1.2)
Total Protein: 7.2 g/dL (ref 6.5–8.1)

## 2023-04-13 LAB — PROTIME-INR
INR: 1 (ref 0.8–1.2)
Prothrombin Time: 13.4 s (ref 11.4–15.2)

## 2023-04-13 LAB — RESP PANEL BY RT-PCR (RSV, FLU A&B, COVID)  RVPGX2
Influenza A by PCR: NEGATIVE
Influenza B by PCR: NEGATIVE
Resp Syncytial Virus by PCR: NEGATIVE
SARS Coronavirus 2 by RT PCR: NEGATIVE

## 2023-04-13 LAB — ETHANOL: Alcohol, Ethyl (B): <10 mg/dL (ref <10)

## 2023-04-13 LAB — PROCALCITONIN: Procalcitonin: 0.59 ng/mL

## 2023-04-13 LAB — LACTIC ACID, PLASMA: Lactic Acid, Venous: 2.2 mmol/L (ref 0.5–1.9)

## 2023-04-13 MED ORDER — SODIUM CHLORIDE 0.9 % IV SOLN: INTRAVENOUS | Status: DC

## 2023-04-13 MED ORDER — VANCOMYCIN HCL 2000 MG/400ML IV SOLN
2000.0000 mg | Freq: Once | INTRAVENOUS | Status: AC
  Administered 2023-04-13: 2000 mg via INTRAVENOUS
  Filled 2023-04-13: qty 400

## 2023-04-13 MED ORDER — SODIUM CHLORIDE 0.9 % IV BOLUS
1000.0000 mL | Freq: Once | INTRAVENOUS | Status: AC
  Administered 2023-04-13: 1000 mL via INTRAVENOUS

## 2023-04-13 MED ORDER — ATORVASTATIN CALCIUM 20 MG PO TABS: 20.0000 mg | ORAL_TABLET | Freq: Every day | ORAL | Status: DC

## 2023-04-13 MED ORDER — ACETAMINOPHEN 500 MG PO TABS
1000.0000 mg | ORAL_TABLET | Freq: Once | ORAL | Status: AC
  Administered 2023-04-13: 1000 mg via ORAL
  Filled 2023-04-13: qty 2

## 2023-04-13 MED ORDER — VANCOMYCIN VARIABLE DOSE PER UNSTABLE RENAL FUNCTION (PHARMACIST DOSING): Status: DC

## 2023-04-13 MED ORDER — INSULIN GLARGINE-YFGN 100 UNIT/ML ~~LOC~~ SOLN: 20.0000 [IU] | Freq: Two times a day (BID) | SUBCUTANEOUS | Status: DC

## 2023-04-13 MED ORDER — VENLAFAXINE HCL ER 150 MG PO CP24: 150.0000 mg | ORAL_CAPSULE | Freq: Two times a day (BID) | ORAL | Status: DC

## 2023-04-13 MED ORDER — INSULIN ASPART 100 UNIT/ML IJ SOLN: 0.0000 [IU] | Freq: Every day | INTRAMUSCULAR | Status: DC

## 2023-04-13 MED ORDER — GABAPENTIN 300 MG PO CAPS: 300.0000 mg | ORAL_CAPSULE | Freq: Every day | ORAL | Status: DC

## 2023-04-13 MED ORDER — SODIUM CHLORIDE 0.9 % IV BOLUS
500.0000 mL | Freq: Once | INTRAVENOUS | Status: AC
  Administered 2023-04-13: 500 mL via INTRAVENOUS

## 2023-04-13 MED ORDER — DIPHENHYDRAMINE HCL 50 MG/ML IJ SOLN: 12.5000 mg | Freq: Three times a day (TID) | INTRAMUSCULAR | Status: DC | PRN

## 2023-04-13 MED ORDER — SODIUM CHLORIDE 0.9 % IV SOLN
2.0000 g | Freq: Once | INTRAVENOUS | Status: AC
  Administered 2023-04-13: 2 g via INTRAVENOUS
  Filled 2023-04-13: qty 20

## 2023-04-13 MED ORDER — ACETAMINOPHEN 325 MG PO TABS: 650.0000 mg | ORAL_TABLET | Freq: Four times a day (QID) | ORAL | Status: DC | PRN

## 2023-04-13 MED ORDER — FLUOXETINE HCL 20 MG PO CAPS: 20.0000 mg | ORAL_CAPSULE | Freq: Every day | ORAL | Status: DC

## 2023-04-13 MED ORDER — SODIUM CHLORIDE 0.9 % IV SOLN: 2.0000 g | INTRAVENOUS | Status: DC

## 2023-04-13 MED ORDER — ENOXAPARIN SODIUM 60 MG/0.6ML IJ SOSY: 0.5000 mg/kg | PREFILLED_SYRINGE | INTRAMUSCULAR | Status: DC

## 2023-04-13 MED ORDER — INSULIN ASPART 100 UNIT/ML IJ SOLN: 0.0000 [IU] | Freq: Three times a day (TID) | INTRAMUSCULAR | Status: DC

## 2023-04-13 NOTE — ED Triage Notes (Signed)
Pt here with AMS. Pt has slurred speech and weakness, LKW was Monday. Family states pt cannot walk. Pt alert but orientation in question. Pt last tylenol admin was last night.

## 2023-04-13 NOTE — ED Provider Triage Note (Cosign Needed)
Emergency Medicine Provider Triage Evaluation Note  Jacqueline Cole , a 73 y.o. female  was evaluated in triage.  Pt complains of weakness in legs, husband states had a bad headache on Sunday, weak since then but no slurred speech today, also fever.  Review of Systems  Positive:  Negative:   Physical Exam  There were no vitals taken for this visit. Gen:   Awake, no distress  Resp:  Normal effort  MSK:   Moves extremities without difficulty  Other:    Medical Decision Making  Medically screening exam initiated at 12:23 PM.  Appropriate orders placed.  Jacqueline Cole was informed that the remainder of the evaluation will be completed by another provider, this initial triage assessment does not replace that evaluation, and the importance of remaining in the ED until their evaluation is complete.     Jacqueline Ghee, PA-C 04/13/23 1225

## 2023-04-13 NOTE — Progress Notes (Signed)
    Against Medical Advice   Jacqueline Cole expresses desire to leave the Hospital immediately.  Patient became agitated and left prior to my arrival to unit.  Informed by Nursing staff that this patient has left care and has signed the form  Against Medical Advice on 04/13/2023 at 1000 pm    Jacqueline Schwartz NP [facility]

## 2023-04-13 NOTE — ED Provider Notes (Signed)
[facility] Provider Note    Event Date/Time   First MD Initiated Contact with Patient 04/13/23 1533     (approximate)   History   Altered Mental Status   HPI  Jacqueline Cole is a 73 y.o. female with a history of meningioma presents to the ER for evaluation of altered mental status starting last night.  Associated with fever.  Is reporting generalized weakness did fall out of bed last night due to weakness.  Did not strike her head.  Denies any pain.  Denies any abdominal pain.  No dysuria that she noted no cough or congestion no sore throat.     Physical Exam   Triage Vital Signs: ED Triage Vitals  Encounter Vitals Group     BP 04/13/23 1223 131/76     Systolic BP Percentile --      Diastolic BP Percentile --      Pulse Rate 04/13/23 1223 (!) 107     Resp 04/13/23 1223 17     Temp 04/13/23 1223 (!) 102.7 F (39.3 C)     Temp Source 04/13/23 1223 Oral     SpO2 04/13/23 1223 94 %     Weight 04/13/23 1224 222 lb 0.1 oz (100.7 kg)     Height 04/13/23 1224 5\' 5"  (1.651 m)     Head Circumference --      Peak Flow --      Pain Score 04/13/23 1224 0     Pain Loc --      Pain Education --      Exclude from Growth Chart --     Most recent vital signs: Vitals:   04/13/23 1223 04/13/23 1811  BP: 131/76 (!) 126/108  Pulse: (!) 107 61  Resp: 17 17  Temp: (!) 102.7 F (39.3 C) 99 F (37.2 C)  SpO2: 94% 99%     Constitutional: Alert  Eyes: Conjunctivae are normal.  Head: Atraumatic. Nose: No congestion/rhinnorhea. Mouth/Throat: Mucous membranes are moist.   Neck: Painless ROM. No meningismus Cardiovascular:   Good peripheral circulation. Respiratory: Normal respiratory effort.  No retractions.  Gastrointestinal: Soft and nontender.  Musculoskeletal:  no deformity Neurologic:  MAE spontaneously. No gross focal neurologic deficits are appreciated.  Skin:  Skin is warm, dry and intact. No rash noted. Psychiatric: Mood and affect are normal.  Speech and behavior are normal.    ED Results / Procedures / Treatments   Labs (all labs ordered are listed, but only abnormal results are displayed) Labs Reviewed  DIFFERENTIAL - Abnormal; Notable for the following components:      Result Value   Neutro Abs 7.8 (*)    All other components within normal limits  COMPREHENSIVE METABOLIC PANEL - Abnormal; Notable for the following components:   Glucose, Bld 110 (*)    Creatinine, Ser 1.48 (*)    AST 46 (*)    Total Bilirubin 1.7 (*)    GFR, Estimated 38 (*)    All other components within normal limits  LACTIC ACID, PLASMA - Abnormal; Notable for the following components:   Lactic Acid, Venous 2.2 (*)    All other components within normal limits  URINALYSIS, ROUTINE W REFLEX MICROSCOPIC - Abnormal; Notable for the following components:   Color, Urine YELLOW (*)    APPearance CLOUDY (*)    Glucose, UA >=500 (*)    Hgb urine dipstick SMALL (*)    Nitrite POSITIVE (*)    Leukocytes,Ua LARGE (*)  Bacteria, UA MANY (*)    All other components within normal limits  RESP PANEL BY RT-PCR (RSV, FLU A&B, COVID)  RVPGX2  CULTURE, BLOOD (ROUTINE X 2)  CULTURE, BLOOD (ROUTINE X 2)  URINE CULTURE  PROTIME-INR  APTT  CBC  ETHANOL  PROCALCITONIN  LACTIC ACID, PLASMA  BASIC METABOLIC PANEL  CBC  CBG MONITORING, ED     EKG  ED ECG REPORT I, Willy Eddy, the attending physician, personally viewed and interpreted this ECG.   Date: 04/13/2023  EKG Time: 12:41  Rate: 110  Rhythm: sinus  Axis: normal  Intervals: borderline prolonged qt  ST&T Change: nonspecific st abn    RADIOLOGY Please see ED Course for my review and interpretation.  I personally reviewed all radiographic images ordered to evaluate for the above acute complaints and reviewed radiology reports and findings.  These findings were personally discussed with the patient.  Please see medical record for radiology report.    PROCEDURES:  Critical Care  performed: No  Procedures   MEDICATIONS ORDERED IN ED: Medications  cefTRIAXone (ROCEPHIN) 2 g in sodium chloride 0.9 % 100 mL IVPB (has no administration in time range)  diphenhydrAMINE (BENADRYL) injection 12.5 mg (has no administration in time range)  acetaminophen (TYLENOL) tablet 650 mg (has no administration in time range)  insulin aspart (novoLOG) injection 0-9 Units (has no administration in time range)  insulin aspart (novoLOG) injection 0-5 Units (has no administration in time range)  0.9 %  sodium chloride infusion ( Intravenous New Bag/Given 04/13/23 2001)  enoxaparin (LOVENOX) injection 50 mg (has no administration in time range)  vancomycin (VANCOREADY) IVPB 2000 mg/400 mL (has no administration in time range)  vancomycin variable dose per unstable renal function (pharmacist dosing) (has no administration in time range)  acetaminophen (TYLENOL) tablet 1,000 mg (1,000 mg Oral Given 04/13/23 1232)  cefTRIAXone (ROCEPHIN) 2 g in sodium chloride 0.9 % 100 mL IVPB (0 g Intravenous Stopped 04/13/23 1955)  sodium chloride 0.9 % bolus 500 mL (0 mLs Intravenous Stopped 04/13/23 1955)  sodium chloride 0.9 % bolus 1,000 mL (1,000 mLs Intravenous New Bag/Given 04/13/23 1959)     IMPRESSION / MDM / ASSESSMENT AND PLAN / ED COURSE  I reviewed the triage vital signs and the nursing notes.                              Differential diagnosis includes, but is not limited to, Dehydration, sepsis, pna, uti, hypoglycemia, cva, drug effect, withdrawal, encephalitis  Patient presenting to the ER for evaluation of symptoms as described above.  Based on symptoms, risk factors and considered above differential, this presenting complaint could reflect a potentially life-threatening illness therefore the patient will be placed on continuous pulse oximetry and telemetry for monitoring.  Laboratory evaluation will be sent to evaluate for the above complaints.      Clinical Course as of 04/13/23 2030   Wed Apr 13, 2023  1832 Patient with UTI consistent with patient's source of infection.  Patient receiving IV fluids.  Still very weak requiring wheelchair and two-person assist which is a significant change from baseline.  Given her sepsis altered mental status weakness will consult hospitalist for admission. [PR]    Clinical Course User Index [PR] Willy Eddy, MD     FINAL CLINICAL IMPRESSION(S) / ED DIAGNOSES   Final diagnoses:  Sepsis with encephalopathy without septic shock, due to unspecified organism [facility])     Rx /  DC Orders   ED Discharge Orders     None        Note:  This document was prepared using Dragon voice recognition software and may include unintentional dictation errors.    Willy Eddy, MD 04/13/23 2030

## 2023-04-13 NOTE — ED Notes (Addendum)
Critical Result: Lactic 2.2  Mumma, MD made aware.

## 2023-04-13 NOTE — H&P (Signed)
History and Physical    Jacqueline Cole:[id number] DOB: 04/17/1950 DOA: 04/13/2023  Referring MD/NP/PA:   PCP: Karie Schwalbe, MD   Patient coming from:  The patient is coming from home.     Chief Complaint: Dysuria, altered mental status  HPI: Jacqueline Cole is a 73 y.o. female with medical history significant of hyperlipidemia, diabetes mellitus, PVD, memory loss, depression, CKD-3B, meningioma (s/p of craniotomy with tumor resection), obesity, who presents with dysuria and altered mental status.  Per her husband at the bedside, patient has been confused in the past several days.  Husband reports that patient has slurred speech in the past several days.  Her last known normal is 4 days ago. When I saw patient in ED, she has mild confusion.  She is alert, oriented to the person and place, but intermittently confused about the time.  She moves all extremities normally.  She seems to have mild slurred speech during the interview no facial droop.  Patient does not have nausea, vomiting, diarrhea, but reports mild epigastric abdominal discomfort.  She has dysuria, burning with urination.  No hematuria.  She denies cough to me.  No shortness breath or chest pain.  Patient denies subjective fever, but her temperature is 102.7 in ED.  Data reviewed independently and ED Course: pt was found to have WBC 9.5, slightly worsening renal function, UA (cloudy appearance, large amount of leukocyte, positive nitrite, many bacteria, WBC> 50), negative PCR for COVID, flu and RSV, lactic acid 2.2.  Temperature 102.7, blood pressure 126/108, heart rate 107, RR 17, oxygen saturation 99% on room air.  Chest x-ray is negative for infiltration.  CT head is negative for acute intracranial abnormalities.  CT-renal stone is negative for renal stone or obstructive uropathy.  Patient is admitted to telemetry bed as inpatient.  CT scan of head: 1. No acute intracranial process. 2. Status post right frontal craniotomy  for meningioma resection, with encephalomalacia in the right anterior frontal lobe, which appears similar to the 02/24/2023 MRI when accounting for differences in technique.   CT-renal stone: 1. No renal stone or obstructive uropathy. 2. No acute findings in the abdomen pelvis. 3. Small hiatal hernia. 4.  Aortic Atherosclerosis (ICD10-I70.0).     EKG: I have personally reviewed.  Sinus rhythm, QTc 573, LAE, poor R wave progression, low voltage, LAD.   Review of Systems:   General: Has fevers, no chills, no body weight gain, has fatigue HEENT: no blurry vision, hearing changes or sore throat Respiratory: no dyspnea, coughing, wheezing CV: no chest pain, no palpitations GI: no nausea, vomiting, abdominal pain, diarrhea, constipation GU: has dysuria, burning on urination, increased urinary frequency, no hematuria  Ext: no leg edema Neuro: no unilateral weakness, numbness, or tingling, no vision change or hearing loss.  Has confusion and slurred speech Skin: no rash, no skin tear. MSK: No muscle spasm, no deformity, no limitation of range of movement in spin Heme: No easy bruising.  Travel history: No recent long distant travel.   Allergy:  Allergies  Allergen Reactions   Onglyza [Saxagliptin] Hives and Nausea And Vomiting   Semaglutide(0.25 Or 0.5mg -Dos) Nausea And Vomiting and Other (See Comments)    Extreme dehydration   Liraglutide Nausea Only    Can't eat, nausea, higher sugars   Tape Other (See Comments)    Thin skin     Past Medical History:  Diagnosis Date   Brain tumor (benign) (HCC)    Chronic kidney disease    stage  3   Depression    Diabetes mellitus type II    Headache    brain tumor   Hyperlipemia    Major depressive disorder, recurrent, in remission (HCC) 09/01/2006   Qualifier: Diagnosis of  By: [name]     Multiple fibroadenomata of both breasts    Obesity    Osteopenia    PAD (peripheral artery disease) (HCC)    on screening   Syncope  1998   DM diagnosis    Past Surgical History:  Procedure Laterality Date   ABDOMINAL HYSTERECTOMY  ~2005   and BSO   APPLICATION OF CRANIAL NAVIGATION N/A 12/01/2022   Procedure: APPLICATION OF CRANIAL NAVIGATION;  Surgeon: Venetia Night, MD;  Location: [facility] ORS;  Service: Neurosurgery;  Laterality: N/A;   BREAST BIOPSY Right 01/05/2019   rt stereo bx 2 areas 1st area ribbon clip   BREAST BIOPSY Right 01/05/2019   rt stereo bx 2nd area coil clip   CATARACT EXTRACTION W/ INTRAOCULAR LENS & ANTERIOR VITRECTOMY Bilateral 01/2022   CHOLECYSTECTOMY  1975   COSMETIC SURGERY     Injury Right face as a child   CRANIOTOMY N/A 12/01/2022   Procedure: RIGHT FRONTAL CRANIOTOMY FOR TUMOR EXCISION;  Surgeon: Venetia Night, MD;  Location: [facility] ORS;  Service: Neurosurgery;  Laterality: N/A;   ESOPHAGOGASTRODUODENOSCOPY (EGD) WITH PROPOFOL N/A 11/18/2020   Procedure: ESOPHAGOGASTRODUODENOSCOPY (EGD) WITH PROPOFOL;  Surgeon: Wyline Mood, MD;  Location: [facility] ENDOSCOPY;  Service: Gastroenterology;  Laterality: N/A;   VAGINAL DELIVERY     X 2    Social History:  reports that she quit smoking about 30 years ago. Her smoking use included cigarettes. She has never been exposed to tobacco smoke. She has never used smokeless tobacco. She reports that she does not drink alcohol and does not use drugs.  Family History:  Family History  Problem Relation Age of Onset   Stroke Father        CVA   Heart disease Father        MI   Cancer Sister        breast cancer   Breast cancer Sister 29   Heart disease Brother        MI   Cancer Brother        Lung     Prior to Admission medications   Medication Sig Start Date End Date Taking? Authorizing Provider  atorvastatin (LIPITOR) 20 MG tablet TAKE ONE TABLET BY MOUTH ONCE WEEKLY ON WEDNESDAY and TAKE ONE TABLET BY MOUTH ONCE WEEKLY ON SUNDAY 09/23/22   Tillman Abide I, MD  Blood Glucose Monitoring Suppl (ONETOUCH VERIO FLEX SYSTEM) w/Device KIT Use  to check blood sugar 2 times a day. 05/25/18   Carlus Pavlov, MD  Continuous Glucose Sensor (FREESTYLE LIBRE 3 PLUS SENSOR) MISC Change sensor every 15 days. 01/25/23   Karie Schwalbe, MD  empagliflozin (JARDIANCE) 25 MG TABS tablet Take 1 tablet (25 mg total) by mouth daily before breakfast. 10/25/22   Karie Schwalbe, MD  gabapentin (NEURONTIN) 300 MG capsule TAKE TWO CAPSULES BY MOUTH EVERYDAY AT BEDTIME 10/25/22   Karie Schwalbe, MD  glipiZIDE (GLUCOTROL XL) 2.5 MG 24 hr tablet Take 1 tablet (2.5 mg total) by mouth daily with breakfast. 06/30/22   Tillman Abide I, MD  glucose blood (ONETOUCH VERIO) test strip Use as instructed to check blood sugar 2 times a day. 04/02/20   Carlus Pavlov, MD  hydrocortisone cream 1 % Apply 1 Application topically daily as  needed for itching. 10/21/21   Karie Schwalbe, MD  ibuprofen (ADVIL) 400 MG tablet Take 400 mg by mouth every 6 (six) hours as needed. Patient not taking: Reported on 03/22/2023    [provider]  Ibuprofen-diphenhydrAMINE Cit (ADVIL PM PO) Take 1 tablet by mouth at bedtime as needed.    [provider]  insulin glargine (LANTUS) 100 UNIT/ML injection Inject 0.35 mLs (35 Units total) into the skin 2 (two) times daily. Patient taking differently: Inject 35 Units into the skin 2 (two) times daily. 35 am , 30 pm 11/12/22   Karie Schwalbe, MD  Lancets [facility] ULTRASOFT) lancets Use as instructed to check blood sugar 2 times a day. 04/02/20   Carlus Pavlov, MD  triamcinolone cream (KENALOG) 0.1 % Apply TO THE affected area(s) TOPICALLY twice daily AS NEEDED 10/04/22   Karie Schwalbe, MD  trolamine salicylate (ASPERCREME) 10 % cream Apply 1 Application topically as needed for muscle pain. Apply to feet 10/21/21   Karie Schwalbe, MD  venlafaxine XR (EFFEXOR-XR) 150 MG 24 hr capsule TAKE ONE CAPSULE BY MOUTH EVERY MORNING and TAKE ONE CAPSULE BY MOUTH EVERYDAY AT BEDTIME 10/25/22   Karie Schwalbe, MD     Physical Exam: Vitals:   04/13/23 1223 04/13/23 1224 04/13/23 1811 04/13/23 2043  BP: 131/76  (!) 126/108 (!) 169/65  Pulse: (!) 107  61 78  Resp: 17  17 18   Temp: (!) 102.7 F (39.3 C)  99 F (37.2 C)   TempSrc: Oral  Oral   SpO2: 94%  99% 99%  Weight:  100.7 kg    Height:  5\' 5"  (1.651 m)     General: Not in acute distress HEENT:       Eyes: PERRL, EOMI, no jaundice       ENT: No discharge from the ears and nose, no pharynx injection, no tonsillar enlargement.        Neck: No JVD, no bruit, no mass felt. Heme: No neck lymph node enlargement. Cardiac: S1/S2, RRR, No murmurs, No gallops or rubs. Respiratory: No rales, wheezing, rhonchi or rubs. GI: Soft, nondistended, nontender, no rebound pain, no organomegaly, BS present. GU: No hematuria Ext: No pitting leg edema bilaterally. 1+DP/PT pulse bilaterally. Musculoskeletal: No joint deformities, No joint redness or warmth, no limitation of ROM in spin. Skin: No rashes.  Neuro: Mildly confused, orientated to place and person, intermittently confused about time. Cranial nerves II-XII grossly intact. Muscle strength 5/5 in all extremities, sensation to light touch intact.  Psych: Patient is not psychotic, no suicidal or hemocidal ideation.  Labs on Admission: I have personally reviewed following labs and imaging studies  CBC: Recent Labs  Lab 04/13/23 1236  WBC 9.5  NEUTROABS 7.8*  HGB 14.0  HCT 42.1  MCV 84.2  PLT 195   Basic Metabolic Panel: Recent Labs  Lab 04/13/23 1236  NA 135  K 3.5  CL 101  CO2 22  GLUCOSE 110*  BUN 15  CREATININE 1.48*  CALCIUM 9.0   GFR: Estimated Creatinine Clearance: 41 mL/min (A) (by C-G formula based on SCr of 1.48 mg/dL (H)). Liver Function Tests: Recent Labs  Lab 04/13/23 1236  AST 46*  ALT 30  ALKPHOS 124  BILITOT 1.7*  PROT 7.2  ALBUMIN 3.7   No results for input(s): "LIPASE", "AMYLASE" in the last 168 hours. No results for input(s): "AMMONIA" in the last 168  hours. Coagulation Profile: Recent Labs  Lab 04/13/23 1236  INR 1.0  Cardiac Enzymes: No results for input(s): "CKTOTAL", "CKMB", "CKMBINDEX", "TROPONINI" in the last 168 hours. BNP (last 3 results) No results for input(s): "PROBNP" in the last 8760 hours. HbA1C: No results for input(s): "HGBA1C" in the last 72 hours. CBG: No results for input(s): "GLUCAP" in the last 168 hours. Lipid Profile: No results for input(s): "CHOL", "HDL", "LDLCALC", "TRIG", "CHOLHDL", "LDLDIRECT" in the last 72 hours. Thyroid Function Tests: No results for input(s): "TSH", "T4TOTAL", "FREET4", "T3FREE", "THYROIDAB" in the last 72 hours. Anemia Panel: No results for input(s): "VITAMINB12", "FOLATE", "FERRITIN", "TIBC", "IRON", "RETICCTPCT" in the last 72 hours. Urine analysis:    Component Value Date/Time   COLORURINE YELLOW (A) 04/13/2023 1743   APPEARANCEUR CLOUDY (A) 04/13/2023 1743   LABSPEC 1.010 04/13/2023 1743   PHURINE 5.0 04/13/2023 1743   GLUCOSEU >=500 (A) 04/13/2023 1743   HGBUR SMALL (A) 04/13/2023 1743   BILIRUBINUR NEGATIVE 04/13/2023 1743   KETONESUR NEGATIVE 04/13/2023 1743   PROTEINUR NEGATIVE 04/13/2023 1743   NITRITE POSITIVE (A) 04/13/2023 1743   LEUKOCYTESUR LARGE (A) 04/13/2023 1743   Sepsis Labs: @LABRCNTIP (procalcitonin:4,lacticidven:4) ) Recent Results (from the past 240 hours)  Resp panel by RT-PCR (RSV, Flu A&B, Covid) Anterior Nasal Swab     Status: None   Collection Time: 04/13/23 12:30 PM   Specimen: Anterior Nasal Swab  Result Value Ref Range Status   SARS Coronavirus 2 by RT PCR NEGATIVE NEGATIVE Final    Comment: (NOTE) SARS-CoV-2 target nucleic acids are NOT DETECTED.  The SARS-CoV-2 RNA is generally detectable in upper respiratory specimens during the acute phase of infection. The lowest concentration of SARS-CoV-2 viral copies this assay can detect is 138 copies/mL. A negative result does not preclude SARS-Cov-2 infection and should not be used as the  sole basis for treatment or other patient management decisions. A negative result may occur with  improper specimen collection/handling, submission of specimen other than nasopharyngeal swab, presence of viral mutation(s) within the areas targeted by this assay, and inadequate number of viral copies(<138 copies/mL). A negative result must be combined with clinical observations, patient history, and epidemiological information. The expected result is Negative.  Fact Sheet for Patients:  BloggerCourse.com  Fact Sheet for Healthcare Providers:  SeriousBroker.it  This test is no t yet approved or cleared by the [place] FDA and  has been authorized for detection and/or diagnosis of SARS-CoV-2 by FDA under an Emergency Use Authorization (EUA). This EUA will remain  in effect (meaning this test can be used) for the duration of the COVID-19 declaration under Section 564(b)(1) of the Act, 21 U.S.C.section 360bbb-3(b)(1), unless the authorization is terminated  or revoked sooner.       Influenza A by PCR NEGATIVE NEGATIVE Final   Influenza B by PCR NEGATIVE NEGATIVE Final    Comment: (NOTE) The Xpert Xpress SARS-CoV-2/FLU/RSV plus assay is intended as an aid in the diagnosis of influenza from Nasopharyngeal swab specimens and should not be used as a sole basis for treatment. Nasal washings and aspirates are unacceptable for Xpert Xpress SARS-CoV-2/FLU/RSV testing.  Fact Sheet for Patients: BloggerCourse.com  Fact Sheet for Healthcare Providers: SeriousBroker.it  This test is not yet approved or cleared by the [place] FDA and has been authorized for detection and/or diagnosis of SARS-CoV-2 by FDA under an Emergency Use Authorization (EUA). This EUA will remain in effect (meaning this test can be used) for the duration of the COVID-19 declaration under Section 564(b)(1) of the  Act, 21 U.S.C. section 360bbb-3(b)(1), unless the authorization is  terminated or revoked.     Resp Syncytial Virus by PCR NEGATIVE NEGATIVE Final    Comment: (NOTE) Fact Sheet for Patients: BloggerCourse.com  Fact Sheet for Healthcare Providers: SeriousBroker.it  This test is not yet approved or cleared by the [place] FDA and has been authorized for detection and/or diagnosis of SARS-CoV-2 by FDA under an Emergency Use Authorization (EUA). This EUA will remain in effect (meaning this test can be used) for the duration of the COVID-19 declaration under Section 564(b)(1) of the Act, 21 U.S.C. section 360bbb-3(b)(1), unless the authorization is terminated or revoked.  Performed at [facility], [address] [place], [place] [address]      Radiological Exams on Admission:   Assessment/Plan Principal Problem:   UTI (urinary tract infection) Active Problems:   Severe sepsis (HCC)   Acute metabolic encephalopathy   Slurred speech   Hyperlipemia   Stage 3b chronic kidney disease (HCC)   Type II diabetes mellitus with renal manifestations (HCC)   MDD (major depressive disorder), recurrent episode, moderate (HCC)   Obesity (BMI 30-39.9)   Assessment and Plan:   Severe sepsis due to UTI (urinary tract infection): Patient meets criteria for sepsis with fever 102.7, heart rate up to 107.  Lactic acid 2.2.  Assessment and plan was made as follow follows. Unfortunately per report, patient left hospital AMA.  I did not know this before patient hemolyticus. Patient has history of positive urine culture for  -Admitted to telemetry bed as inpatient -Rocephin and vancomycin -Follow-up blood culture urine culture -IV fluid: 1.5 L normal saline, then 75 cc/h -Check procalcitonin level  Acute metabolic encephalopathy: Mild.  CT head negative.  Most likely due to UTI -Frequent neurocheck -Fall precaution  Slurred  speech: -MRI of the brain is ordered, but was not done before patient left  Hyperlipemia -Lipitor  Stage 3b chronic kidney disease [facility]): Renal function slightly worsening than baseline.  Baseline creatinine 1.12 on 12/03/2022.  Her creatinine is 1.48, BUN 15, GFR 38 today.  Likely due to UTI -Follow-up with BMP  Type II diabetes mellitus with renal manifestations [facility]): Recent A1c 8.5, poorly controlled.  Patient is taking Jardiance, glipizide and Lantus 35-30 units twice daily -Sliding scale insulin -Lantus 25-20 unit twice daily  MDD (major depressive disorder), recurrent episode, moderate (HCC) -Continue home medications  Obesity (BMI 30-39.9): Body weight 100.7 kg, BMI 36.94 -Encourage losing weight -Exercise and healthy diet      DVT ppx: SQ Lovenox  Code Status: Full code     Family Communication:   Yes, patient's aspirin at bed side.        Disposition Plan:  Anticipate discharge back to previous environment  Consults called: None  Admission status and Level of care: Telemetry Medical:   as inpt        Dispo: The patient is from: Home              Anticipated d/c is to: Home              Anticipated d/c date is: 2 days              Patient currently is not medically stable to d/c.    Severity of Illness:  The appropriate patient status for this patient is INPATIENT. Inpatient status is judged to be reasonable and necessary in order to provide the required intensity of service to ensure the patient's safety. The patient's presenting symptoms, physical exam findings, and initial radiographic and laboratory data  in the context of their chronic comorbidities is felt to place them at high risk for further clinical deterioration. Furthermore, it is not anticipated that the patient will be medically stable for discharge from the hospital within 2 midnights of admission.   * I certify that at the point of admission it is my clinical judgment that the patient will  require inpatient hospital care spanning beyond 2 midnights from the point of admission due to high intensity of service, high risk for further deterioration and high frequency of surveillance required.*       Date of Service 04/14/2023    Lorretta Harp [facility]   If 7PM-7AM, please contact night-coverage [email or web address] 04/14/2023, 1:07 AM

## 2023-04-13 NOTE — ED Notes (Addendum)
Soon after this RN moved the patient over to [facility]HA the patient and family notified this RN that they want to be AMA since they are not being moved upstairs. This RN offered other accomodations at this time, but patient still wanted to leave. Jon Billings, NP notified regarding the patients wishes to leave AMA. Prior to getting a response the patient began to get agitated and started to walk out, so the IV was then removed and Sinda Du (husband) signed the [facility] form. No acute distress noted and patient ambulated with an even steady gate out to the lobby.

## 2023-04-13 NOTE — Consult Note (Signed)
Pharmacy Antibiotic Note  Jacqueline Cole is a 73 y.o. female admitted on 04/13/2023 with UTI.  Pharmacy has been consulted for vancomycin dosing. Pt is in a possible AKI. Baseline Scr 1- 1.1? Current Scr 1.48. Tmax 102.7, WBC 9.5, lactic acid 2.2, UA: WBC > 50, 0-5 squamous epitherial, leukocytes large, positive nitrate. Pt here with AMS, unknown symptoms. 10/2022 Ucx grew 10,000 COLONIES/mL STAPHYLOCOCCUS HAEMOLYTICUS resistant to oxacillin (possible contaminant?). No Ucx obtain prior to abx administration.   Plan: Day 1 of abx.  Will give loading dose of vancomycin 2000 mg x 1. Will follow up with Scr on 1/30 and assess whether to start maintenance therapy or dose by levels.   Continue ceftriaxone 2 g daily.   Height: 5\' 5"  (165.1 cm) Weight: 100.7 kg (222 lb 0.1 oz) IBW/kg (Calculated) : 57  Temp (24hrs), Avg:100.9 F (38.3 C), Min:99 F (37.2 C), Max:102.7 F (39.3 C)  Recent Labs  Lab 04/13/23 1236  WBC 9.5  CREATININE 1.48*  LATICACIDVEN 2.2*    Estimated Creatinine Clearance: 41 mL/min (A) (by C-G formula based on SCr of 1.48 mg/dL (H)).    Allergies  Allergen Reactions   Onglyza [Saxagliptin] Hives and Nausea And Vomiting   Semaglutide(0.25 Or 0.5mg -Dos) Nausea And Vomiting and Other (See Comments)    Extreme dehydration   Liraglutide Nausea Only    Can't eat, nausea, higher sugars   Tape Other (See Comments)    Thin skin     Antimicrobials this admission: 1/29 ceftriaxone >>  1/29 vancomycin >>   Dose adjustments this admission: None  Microbiology results: 1/29 BCx: pending  Thank you for allowing pharmacy to be a part of this patient's care.  Ronnald Ramp, PharmD, BCPS 04/13/2023 7:24 PM

## 2023-04-13 NOTE — ED Notes (Signed)
Pt to CT

## 2023-04-13 NOTE — ED Notes (Signed)
Pt cbg 122, pt has a Dexcom per family

## 2023-04-14 ENCOUNTER — Telehealth: Payer: Self-pay | Admitting: Internal Medicine

## 2023-04-14 ENCOUNTER — Encounter: Payer: Self-pay | Admitting: Internal Medicine

## 2023-04-14 ENCOUNTER — Ambulatory Visit: Payer: Medicare HMO | Admitting: Internal Medicine

## 2023-04-14 VITALS — BP 114/70 | HR 80 | Temp 97.7°F | Ht 65.0 in | Wt 223.0 lb

## 2023-04-14 DIAGNOSIS — Z794 Long term (current) use of insulin: Secondary | ICD-10-CM

## 2023-04-14 DIAGNOSIS — E1122 Type 2 diabetes mellitus with diabetic chronic kidney disease: Secondary | ICD-10-CM

## 2023-04-14 DIAGNOSIS — N1832 Chronic kidney disease, stage 3b: Secondary | ICD-10-CM | POA: Diagnosis not present

## 2023-04-14 DIAGNOSIS — L219 Seborrheic dermatitis, unspecified: Secondary | ICD-10-CM | POA: Insufficient documentation

## 2023-04-14 DIAGNOSIS — G9341 Metabolic encephalopathy: Secondary | ICD-10-CM

## 2023-04-14 LAB — POCT GLYCOSYLATED HEMOGLOBIN (HGB A1C): Hemoglobin A1C: 9 % — AB (ref 4.0–5.6)

## 2023-04-14 MED ORDER — GLIPIZIDE 5 MG PO TABS: 5.0000 mg | ORAL_TABLET | Freq: Two times a day (BID) | ORAL | 11 refills | Status: AC

## 2023-04-14 MED ORDER — SULFAMETHOXAZOLE-TRIMETHOPRIM 800-160 MG PO TABS: 1.0000 | ORAL_TABLET | Freq: Two times a day (BID) | ORAL | 0 refills | Status: DC

## 2023-04-14 NOTE — Telephone Encounter (Signed)
Pt made it to her appt today

## 2023-04-14 NOTE — Assessment & Plan Note (Signed)
Dandruff and on face Discussed T-gel at least twice a week Cortaid for face

## 2023-04-14 NOTE — Assessment & Plan Note (Signed)
Likely but not confirmed Got rocephin and culture is pending

## 2023-04-14 NOTE — Progress Notes (Signed)
Subjective:    Patient ID: Jacqueline Cole, female    DOB: [birth year], 73 y.o.   MRN: [id number]  HPI Here with husband for hospital follow up Had been so weak--she couldn't walk Preacher helped husband get her into the car Change in mental status also Urinary frequency and incontinence--just in the past 2 days or so (drinks a lot of water, and this hasn't changed)   Had been for a long time in the waiting area---without a mask Then finally moved to see doctor Had blood work and CT renal--and head CT Then moved to a different area--out in hall --and she was freezing (and staff too busy to get her blanket) Needed bathroom--and husband had to wheel her there  Has been short tempered since the surgery--and has some confusion This was worse with the whole episode---and she wanted to leave. Husband tried to get her to stay--but didn't want to so she left Did get rocephin x 1--and getting some fluid as well (at least one bag--?500cc) Some trouble getting her in car---some better when got home (able to get up 5 steps slowly) Didn't sleep well Is better today---able to walk again Husband thinks she is more mentally clear  Sugars are up and down High at night---but low in the morning  No chest pain or SOB  Having trouble affording the jardiance now--went up to $300 monthly  Current Outpatient Medications on File Prior to Visit  Medication Sig Dispense Refill   atorvastatin (LIPITOR) 20 MG tablet TAKE ONE TABLET BY MOUTH ONCE WEEKLY ON WEDNESDAY and TAKE ONE TABLET BY MOUTH ONCE WEEKLY ON SUNDAY 26 tablet 3   Blood Glucose Monitoring Suppl (ONETOUCH VERIO FLEX SYSTEM) w/Device KIT Use to check blood sugar 2 times a day. 1 kit 0   Continuous Glucose Sensor (FREESTYLE LIBRE 3 PLUS SENSOR) MISC Change sensor every 15 days. 6 each 3   empagliflozin (JARDIANCE) 25 MG TABS tablet Take 1 tablet (25 mg total) by mouth daily before breakfast. 90 tablet 1   gabapentin (NEURONTIN) 300 MG capsule  TAKE TWO CAPSULES BY MOUTH EVERYDAY AT BEDTIME 180 capsule 3   glipiZIDE (GLUCOTROL XL) 2.5 MG 24 hr tablet Take 1 tablet (2.5 mg total) by mouth daily with breakfast. 90 tablet 3   glucose blood (ONETOUCH VERIO) test strip Use as instructed to check blood sugar 2 times a day. 200 each 12   ibuprofen (ADVIL) 400 MG tablet Take 400 mg by mouth every 6 (six) hours as needed.     Ibuprofen-diphenhydrAMINE Cit (ADVIL PM PO) Take 1 tablet by mouth at bedtime as needed.     insulin glargine (LANTUS) 100 UNIT/ML injection Inject 0.35 mLs (35 Units total) into the skin 2 (two) times daily. (Patient taking differently: Inject 35 Units into the skin 2 (two) times daily. 35 am , 30 pm) 20 mL 12   Lancets (ONETOUCH ULTRASOFT) lancets Use as instructed to check blood sugar 2 times a day. 200 each 12   venlafaxine XR (EFFEXOR-XR) 150 MG 24 hr capsule TAKE ONE CAPSULE BY MOUTH EVERY MORNING and TAKE ONE CAPSULE BY MOUTH EVERYDAY AT BEDTIME 180 capsule 3   No current facility-administered medications on file prior to visit.    Allergies  Allergen Reactions   Onglyza [Saxagliptin] Hives and Nausea And Vomiting   Semaglutide(0.25 Or 0.5mg -Dos) Nausea And Vomiting and Other (See Comments)    Extreme dehydration   Liraglutide Nausea Only    Can't eat, nausea, higher sugars   Tape  Other (See Comments)    Thin skin     Past Medical History:  Diagnosis Date   Brain tumor (benign) (HCC)    Chronic kidney disease    stage 3   Depression    Diabetes mellitus type II    Headache    brain tumor   Hyperlipemia    Major depressive disorder, recurrent, in remission (HCC) 09/01/2006   Qualifier: Diagnosis of  By: [name]     Multiple fibroadenomata of both breasts    Obesity    Osteopenia    PAD (peripheral artery disease) (HCC)    on screening   Syncope 1998   DM diagnosis    Past Surgical History:  Procedure Laterality Date   ABDOMINAL HYSTERECTOMY  ~2005   and BSO   APPLICATION OF CRANIAL  NAVIGATION N/A 12/01/2022   Procedure: APPLICATION OF CRANIAL NAVIGATION;  Surgeon: Venetia Night, MD;  Location: [facility] ORS;  Service: Neurosurgery;  Laterality: N/A;   BREAST BIOPSY Right 01/05/2019   rt stereo bx 2 areas 1st area ribbon clip   BREAST BIOPSY Right 01/05/2019   rt stereo bx 2nd area coil clip   CATARACT EXTRACTION W/ INTRAOCULAR LENS & ANTERIOR VITRECTOMY Bilateral 01/2022   CHOLECYSTECTOMY  1975   COSMETIC SURGERY     Injury Right face as a child   CRANIOTOMY N/A 12/01/2022   Procedure: RIGHT FRONTAL CRANIOTOMY FOR TUMOR EXCISION;  Surgeon: Venetia Night, MD;  Location: [facility] ORS;  Service: Neurosurgery;  Laterality: N/A;   ESOPHAGOGASTRODUODENOSCOPY (EGD) WITH PROPOFOL N/A 11/18/2020   Procedure: ESOPHAGOGASTRODUODENOSCOPY (EGD) WITH PROPOFOL;  Surgeon: Wyline Mood, MD;  Location: [facility];  Service: Gastroenterology;  Laterality: N/A;   VAGINAL DELIVERY     X 2    Family History  Problem Relation Age of Onset   Stroke Father        CVA   Heart disease Father        MI   Cancer Sister        breast cancer   Breast cancer Sister 67   Heart disease Brother        MI   Cancer Brother        Lung    Social History   Socioeconomic History   Marital status: Married    Spouse name: Not on file   Number of children: 2   Years of education: Not on file   Highest education level: Not on file  Occupational History    Comment: "Just up and quit" at [employer]  Tobacco Use   Smoking status: Former    Current packs/day: 0.00    Types: Cigarettes    Quit date: 03/15/1993    Years since quitting: 30.1    Passive exposure: Never   Smokeless tobacco: Never  Vaping Use   Vaping status: Never Used  Substance and Sexual Activity   Alcohol use: No    Alcohol/week: 0.0 standard drinks of alcohol   Drug use: No   Sexual activity: Not Currently    Birth control/protection: Surgical  Other Topics Concern   Not on file  Social History Narrative   Has  living will   Husband, then daughter Arlana Hove, have health care POA   Would reluctantly accept resuscitation but no life prolonging measures   Social Drivers of Health   Financial Resource Strain: Low Risk  (04/03/2020)   Overall Financial Resource Strain (CARDIA)    Difficulty of Paying Living Expenses: Not very hard  Food Insecurity:  No Food Insecurity (12/01/2022)   Hunger Vital Sign    Worried About Running Out of Food in the Last Year: Never true    Ran Out of Food in the Last Year: Never true  Transportation Needs: No Transportation Needs (12/01/2022)   PRAPARE - Administrator, Civil Service (Medical): No    Lack of Transportation (Non-Medical): No  Physical Activity: Not on file  Stress: Not on file  Social Connections: Not on file  Intimate Partner Violence: Not At Risk (12/01/2022)   Humiliation, Afraid, Rape, and Kick questionnaire    Fear of Current or Ex-Partner: No    Emotionally Abused: No    Physically Abused: No    Sexually Abused: No   Review of Systems Does take advil PM every night Known CKD--discussed no ibuprofen    Objective:   Physical Exam Constitutional:      Appearance: Normal appearance.  Cardiovascular:     Rate and Rhythm: Normal rate and regular rhythm.     Heart sounds: No murmur heard.    No gallop.  Pulmonary:     Effort: Pulmonary effort is normal.     Breath sounds: Normal breath sounds. No wheezing or rales.  Abdominal:     Palpations: Abdomen is soft.     Tenderness: There is no abdominal tenderness. There is no guarding or rebound.  Musculoskeletal:     Cervical back: Neck supple.  Lymphadenopathy:     Cervical: No cervical adenopathy.  Neurological:     Mental Status: She is alert.     Comments: Some generalized weakness but able to walk unassisted and even get up on the table            Assessment & Plan:

## 2023-04-14 NOTE — Telephone Encounter (Signed)
Hopefully she can make it. If he can't get her in, we will need to make sure she was given an antibiotic to go home with (and then can set her up with me for Monday)

## 2023-04-14 NOTE — Telephone Encounter (Signed)
Spoke with patient husband and scheduled her for today. He stated that he will try his best to get her here today. He stated that she is still having a difficult time and it may be hard to get her to come in for an appointment.   Karie Schwalbe, MD  [facility] Admin; [place] [facility] Please see if husband can bring her in today since she left the hospital [facility]

## 2023-04-14 NOTE — Assessment & Plan Note (Addendum)
Has highs and lows I suspect she has not tolerated the jardiance--and it is too expensive---will stop Lab Results  Component Value Date   HGBA1C 9.0 (A) 04/14/2023    Control is an issue Stop jardiance Lantus 35 bid Increase glipizide to 5mg  bid

## 2023-04-14 NOTE — Assessment & Plan Note (Signed)
Creatinine was up yesterday Stop ibuprofen Recheck at next visit

## 2023-04-14 NOTE — Discharge Summary (Signed)
Physician Discharge Summary  Jacqueline Cole NUU:[id number] DOB: [birth year] DOA: 04/13/2023  PCP: Karie Schwalbe, MD  Admit date: 04/13/2023 Discharge date: 04/13/2023  Recommendations for Outpatient Follow-up:  -none, since pt left on AMA  Home Health: none Equipment/Devices: none  Discharge Condition: unknown to me CODE STATUS: full code Diet recommendation: Should be on carb modified and heart healthy diet  Brief/Interim Summary (HPI): Jacqueline Cole is a 73 y.o. female with medical history significant of hyperlipidemia, diabetes mellitus, PVD, memory loss, depression, CKD-3B, meningioma (s/p of craniotomy with tumor resection), obesity, who presents with dysuria and altered mental status.   Per her husband at the bedside, patient has been confused in the past several days.  Husband reports that patient has slurred speech in the past several days.  Her last known normal is 4 days ago. When I saw patient in ED, she has mild confusion.  She is alert, oriented to the person and place, but intermittently confused about the time.  She moves all extremities normally.  She seems to have mild slurred speech during the interview no facial droop.  Patient does not have nausea, vomiting, diarrhea, but reports mild epigastric abdominal discomfort.  She has dysuria, burning with urination.  No hematuria.  She denies cough to me.  No shortness breath or chest pain.  Patient denies subjective fever, but her temperature is 102.7 in ED.   Data reviewed independently and ED Course: pt was found to have WBC 9.5, slightly worsening renal function, UA (cloudy appearance, large amount of leukocyte, positive nitrite, many bacteria, WBC> 50), negative PCR for COVID, flu and RSV, lactic acid 2.2.  Temperature 102.7, blood pressure 126/108, heart rate 107, RR 17, oxygen saturation 99% on room air.  Chest x-ray is negative for infiltration.  CT head is negative for acute intracranial abnormalities.  CT-renal stone is  negative for renal stone or obstructive uropathy.  Patient is admitted to telemetry bed as inpatient.   CT scan of head: 1. No acute intracranial process. 2. Status post right frontal craniotomy for meningioma resection, with encephalomalacia in the right anterior frontal lobe, which appears similar to the 02/24/2023 MRI when accounting for differences in technique.   CT-renal stone: 1. No renal stone or obstructive uropathy. 2. No acute findings in the abdomen pelvis. 3. Small hiatal hernia. 4.  Aortic Atherosclerosis (ICD10-I70.0).    Discharge Diagnoses and Hospital Course:   Principal Problem:   UTI (urinary tract infection) Active Problems:   Severe sepsis (HCC)   Acute metabolic encephalopathy   Slurred speech   Hyperlipemia   Stage 3b chronic kidney disease (HCC)   Type II diabetes mellitus with renal manifestations (HCC)   MDD (major depressive disorder), recurrent episode, moderate (HCC)   Obesity (BMI 30-39.9)     Severe sepsis due to UTI (urinary tract infection): Patient meets criteria for sepsis with fever 102.7, heart rate up to 107.  Lactic acid 2.2.  Assessment and plan was made as follow follows. Unfortunately per report, patient left hospital AMA.  I did not know this before patient hemolyticus. Patient has history of positive urine culture for   -Admitted to telemetry bed as inpatient -Rocephin and vancomycin -Follow-up blood culture urine culture -IV fluid: 1.5 L normal saline, then 75 cc/h -Checked procalcitonin level --> 0.59   Acute metabolic encephalopathy: Mild.  CT head negative.  Most likely due to UTI -Frequent neurocheck -Fall precaution   Slurred speech: -MRI of the brain is ordered, but was not done  before patient left   Hyperlipemia -Lipitor   Stage 3b chronic kidney disease [facility]): Renal function slightly worsening than baseline.  Baseline creatinine 1.12 on 12/03/2022.  Her creatinine is 1.48, BUN 15, GFR 38 today.  Likely due to  UTI -Follow-up with BMP   Type II diabetes mellitus with renal manifestations [facility]): Recent A1c 8.5, poorly controlled.  Patient is taking Jardiance, glipizide and Lantus 35-30 units twice daily -Sliding scale insulin -Lantus 25-20 unit twice daily   MDD (major depressive disorder), recurrent episode, moderate (HCC) -Continue home medications   Obesity (BMI 30-39.9): Body weight 100.7 kg, BMI 36.94 -Encourage losing weight -Exercise and healthy diet        Allergies as of 04/13/2023       Reactions   Onglyza [saxagliptin] Hives, Nausea And Vomiting   Semaglutide(0.25 Or 0.5mg -dos) Nausea And Vomiting, Other (See Comments)   Extreme dehydration   Liraglutide Nausea Only   Can't eat, nausea, higher sugars   Tape Other (See Comments)   Thin skin             Allergies  Allergen Reactions   Onglyza [Saxagliptin] Hives and Nausea And Vomiting   Semaglutide(0.25 Or 0.5mg -Dos) Nausea And Vomiting and Other (See Comments)    Extreme dehydration   Liraglutide Nausea Only    Can't eat, nausea, higher sugars   Tape Other (See Comments)    Thin skin     Consultations: none   Procedures/Studies: CT RENAL STONE STUDY Result Date: 04/13/2023 CLINICAL DATA:  Flank pain.  Stone suspected EXAM: CT ABDOMEN AND PELVIS WITHOUT CONTRAST TECHNIQUE: Multidetector CT imaging of the abdomen and pelvis was performed following the standard protocol without IV contrast. RADIATION DOSE REDUCTION: This exam was performed according to the departmental dose-optimization program which includes automated exposure control, adjustment of the mA and/or kV according to patient size and/or use of iterative reconstruction technique. COMPARISON:  None Available. FINDINGS: Lower chest: Lung bases are clear. Hepatobiliary: No focal hepatic lesion. Normal gallbladder. No biliary duct dilatation. Common bile duct is normal. Pancreas: Pancreas is normal. No ductal dilatation. No pancreatic inflammation.  Spleen: Normal spleen Adrenals/urinary tract: Adrenal glands and kidneys are normal. No nephrolithiasis or ureterolithiasis. The ureters and bladder normal. Stomach/Bowel: Small hiatal hernia. Stomach, duodenum and small bowel normal. Appendix not identified. The colon and rectosigmoid colon are normal. Multiple diverticula of the descending colon and sigmoid colon without acute inflammation. Vascular/Lymphatic: Abdominal aorta is normal caliber with atherosclerotic calcification. There is no retroperitoneal or periportal lymphadenopathy. No pelvic lymphadenopathy. Reproductive: Uterus and adnexa unremarkable. Other: No free fluid. Musculoskeletal: No aggressive osseous lesion. IMPRESSION: 1. No renal stone or obstructive uropathy. 2. No acute findings in the abdomen pelvis. 3. Small hiatal hernia. 4.  Aortic Atherosclerosis (ICD10-I70.0). Electronically Signed   By: Genevive Bi M.D.   On: 04/13/2023 21:32   DG Chest 2 View Result Date: 04/13/2023 CLINICAL DATA:  Altered mental status.  Cough. EXAM: CHEST - 2 VIEW COMPARISON:  AP chest 11/16/2020 FINDINGS: Cardiac silhouette is at the upper limits of normal size for AP technique. Mildly decreased lung volumes with mild bibasilar bronchovascular crowding/subsegmental atelectasis. No definite pleural effusion. No pneumothorax. Mild-to-moderate multilevel degenerative disc changes of the thoracic spine. IMPRESSION: Mildly decreased lung volumes with mild bibasilar bronchovascular crowding/subsegmental atelectasis. Electronically Signed   By: Neita Garnet M.D.   On: 04/13/2023 14:33   CT HEAD WO CONTRAST Result Date: 04/13/2023 CLINICAL DATA:  Weakness in legs, headache, slurred speech EXAM: CT HEAD WITHOUT CONTRAST  TECHNIQUE: Contiguous axial images were obtained from the base of the skull through the vertex without intravenous contrast. RADIATION DOSE REDUCTION: This exam was performed according to the departmental dose-optimization program which includes  automated exposure control, adjustment of the mA and/or kV according to patient size and/or use of iterative reconstruction technique. COMPARISON:  11/16/2020, correlation is also made with 02/24/2023 MRI head FINDINGS: Brain: Status post right frontal craniotomy for meningioma resection, with encephalomalacia in the right anterior frontal lobe, which appears similar to the 02/24/2023 MRI when accounting for differences in technique. No evidence of acute infarct, hemorrhage, mass, mass effect, or midline shift. No hydrocephalus or extra-axial collection. Vascular: No hyperdense vessel. Skull: Negative for fracture or focal lesion. Sinuses/Orbits: No acute finding. Other: The mastoid air cells are well aerated. IMPRESSION: 1. No acute intracranial process. 2. Status post right frontal craniotomy for meningioma resection, with encephalomalacia in the right anterior frontal lobe, which appears similar to the 02/24/2023 MRI when accounting for differences in technique. Electronically Signed   By: Wiliam Ke M.D.   On: 04/13/2023 14:03      Discharge Exam: Vitals:   04/13/23 1811 04/13/23 2043  BP: (!) 126/108 (!) 169/65  Pulse: 61 78  Resp: 17 18  Temp: 99 F (37.2 C)   SpO2: 99% 99%   Vitals:   04/13/23 1223 04/13/23 1224 04/13/23 1811 04/13/23 2043  BP: 131/76  (!) 126/108 (!) 169/65  Pulse: (!) 107  61 78  Resp: 17  17 18   Temp: (!) 102.7 F (39.3 C)  99 F (37.2 C)   TempSrc: Oral  Oral   SpO2: 94%  99% 99%  Weight:  100.7 kg    Height:  5\' 5"  (1.651 m)       The results of significant diagnostics from this hospitalization (including imaging, microbiology, ancillary and laboratory) are listed below for reference.     Microbiology: Recent Results (from the past 240 hours)  Resp panel by RT-PCR (RSV, Flu A&B, Covid) Anterior Nasal Swab     Status: None   Collection Time: 04/13/23 12:30 PM   Specimen: Anterior Nasal Swab  Result Value Ref Range Status   SARS Coronavirus 2 by RT  PCR NEGATIVE NEGATIVE Final    Comment: (NOTE) SARS-CoV-2 target nucleic acids are NOT DETECTED.  The SARS-CoV-2 RNA is generally detectable in upper respiratory specimens during the acute phase of infection. The lowest concentration of SARS-CoV-2 viral copies this assay can detect is 138 copies/mL. A negative result does not preclude SARS-Cov-2 infection and should not be used as the sole basis for treatment or other patient management decisions. A negative result may occur with  improper specimen collection/handling, submission of specimen other than nasopharyngeal swab, presence of viral mutation(s) within the areas targeted by this assay, and inadequate number of viral copies(<138 copies/mL). A negative result must be combined with clinical observations, patient history, and epidemiological information. The expected result is Negative.  Fact Sheet for Patients:  BloggerCourse.com  Fact Sheet for Healthcare Providers:  SeriousBroker.it  This test is no t yet approved or cleared by the [place] FDA and  has been authorized for detection and/or diagnosis of SARS-CoV-2 by FDA under an Emergency Use Authorization (EUA). This EUA will remain  in effect (meaning this test can be used) for the duration of the COVID-19 declaration under Section 564(b)(1) of the Act, 21 U.S.C.section 360bbb-3(b)(1), unless the authorization is terminated  or revoked sooner.       Influenza A by  PCR NEGATIVE NEGATIVE Final   Influenza B by PCR NEGATIVE NEGATIVE Final    Comment: (NOTE) The Xpert Xpress SARS-CoV-2/FLU/RSV plus assay is intended as an aid in the diagnosis of influenza from Nasopharyngeal swab specimens and should not be used as a sole basis for treatment. Nasal washings and aspirates are unacceptable for Xpert Xpress SARS-CoV-2/FLU/RSV testing.  Fact Sheet for Patients: BloggerCourse.com  Fact Sheet for  Healthcare Providers: SeriousBroker.it  This test is not yet approved or cleared by the [place] FDA and has been authorized for detection and/or diagnosis of SARS-CoV-2 by FDA under an Emergency Use Authorization (EUA). This EUA will remain in effect (meaning this test can be used) for the duration of the COVID-19 declaration under Section 564(b)(1) of the Act, 21 U.S.C. section 360bbb-3(b)(1), unless the authorization is terminated or revoked.     Resp Syncytial Virus by PCR NEGATIVE NEGATIVE Final    Comment: (NOTE) Fact Sheet for Patients: BloggerCourse.com  Fact Sheet for Healthcare Providers: SeriousBroker.it  This test is not yet approved or cleared by the [place] FDA and has been authorized for detection and/or diagnosis of SARS-CoV-2 by FDA under an Emergency Use Authorization (EUA). This EUA will remain in effect (meaning this test can be used) for the duration of the COVID-19 declaration under Section 564(b)(1) of the Act, 21 U.S.C. section 360bbb-3(b)(1), unless the authorization is terminated or revoked.  Performed at [facility], [address] [place], [place] [address]      Labs: BNP (last 3 results) No results for input(s): "BNP" in the last 8760 hours. Basic Metabolic Panel: Recent Labs  Lab 04/13/23 1236  NA 135  K 3.5  CL 101  CO2 22  GLUCOSE 110*  BUN 15  CREATININE 1.48*  CALCIUM 9.0   Liver Function Tests: Recent Labs  Lab 04/13/23 1236  AST 46*  ALT 30  ALKPHOS 124  BILITOT 1.7*  PROT 7.2  ALBUMIN 3.7   No results for input(s): "LIPASE", "AMYLASE" in the last 168 hours. No results for input(s): "AMMONIA" in the last 168 hours. CBC: Recent Labs  Lab 04/13/23 1236  WBC 9.5  NEUTROABS 7.8*  HGB 14.0  HCT 42.1  MCV 84.2  PLT 195   Cardiac Enzymes: No results for input(s): "CKTOTAL", "CKMB", "CKMBINDEX", "TROPONINI" in the last  168 hours. BNP: Invalid input(s): "POCBNP" CBG: No results for input(s): "GLUCAP" in the last 168 hours. D-Dimer No results for input(s): "DDIMER" in the last 72 hours. Hgb A1c No results for input(s): "HGBA1C" in the last 72 hours. Lipid Profile No results for input(s): "CHOL", "HDL", "LDLCALC", "TRIG", "CHOLHDL", "LDLDIRECT" in the last 72 hours. Thyroid function studies No results for input(s): "TSH", "T4TOTAL", "T3FREE", "THYROIDAB" in the last 72 hours.  Invalid input(s): "FREET3" Anemia work up No results for input(s): "VITAMINB12", "FOLATE", "FERRITIN", "TIBC", "IRON", "RETICCTPCT" in the last 72 hours. Urinalysis    Component Value Date/Time   COLORURINE YELLOW (A) 04/13/2023 1743   APPEARANCEUR CLOUDY (A) 04/13/2023 1743   LABSPEC 1.010 04/13/2023 1743   PHURINE 5.0 04/13/2023 1743   GLUCOSEU >=500 (A) 04/13/2023 1743   HGBUR SMALL (A) 04/13/2023 1743   BILIRUBINUR NEGATIVE 04/13/2023 1743   KETONESUR NEGATIVE 04/13/2023 1743   PROTEINUR NEGATIVE 04/13/2023 1743   NITRITE POSITIVE (A) 04/13/2023 1743   LEUKOCYTESUR LARGE (A) 04/13/2023 1743   Sepsis Labs Recent Labs  Lab 04/13/23 1236  WBC 9.5   Microbiology Recent Results (from the past 240 hours)  Resp panel by RT-PCR (  RSV, Flu A&B, Covid) Anterior Nasal Swab     Status: None   Collection Time: 04/13/23 12:30 PM   Specimen: Anterior Nasal Swab  Result Value Ref Range Status   SARS Coronavirus 2 by RT PCR NEGATIVE NEGATIVE Final    Comment: (NOTE) SARS-CoV-2 target nucleic acids are NOT DETECTED.  The SARS-CoV-2 RNA is generally detectable in upper respiratory specimens during the acute phase of infection. The lowest concentration of SARS-CoV-2 viral copies this assay can detect is 138 copies/mL. A negative result does not preclude SARS-Cov-2 infection and should not be used as the sole basis for treatment or other patient management decisions. A negative result may occur with  improper specimen  collection/handling, submission of specimen other than nasopharyngeal swab, presence of viral mutation(s) within the areas targeted by this assay, and inadequate number of viral copies(<138 copies/mL). A negative result must be combined with clinical observations, patient history, and epidemiological information. The expected result is Negative.  Fact Sheet for Patients:  BloggerCourse.com  Fact Sheet for Healthcare Providers:  SeriousBroker.it  This test is no t yet approved or cleared by the [place] FDA and  has been authorized for detection and/or diagnosis of SARS-CoV-2 by FDA under an Emergency Use Authorization (EUA). This EUA will remain  in effect (meaning this test can be used) for the duration of the COVID-19 declaration under Section 564(b)(1) of the Act, 21 U.S.C.section 360bbb-3(b)(1), unless the authorization is terminated  or revoked sooner.       Influenza A by PCR NEGATIVE NEGATIVE Final   Influenza B by PCR NEGATIVE NEGATIVE Final    Comment: (NOTE) The Xpert Xpress SARS-CoV-2/FLU/RSV plus assay is intended as an aid in the diagnosis of influenza from Nasopharyngeal swab specimens and should not be used as a sole basis for treatment. Nasal washings and aspirates are unacceptable for Xpert Xpress SARS-CoV-2/FLU/RSV testing.  Fact Sheet for Patients: BloggerCourse.com  Fact Sheet for Healthcare Providers: SeriousBroker.it  This test is not yet approved or cleared by the [place] FDA and has been authorized for detection and/or diagnosis of SARS-CoV-2 by FDA under an Emergency Use Authorization (EUA). This EUA will remain in effect (meaning this test can be used) for the duration of the COVID-19 declaration under Section 564(b)(1) of the Act, 21 U.S.C. section 360bbb-3(b)(1), unless the authorization is terminated or revoked.     Resp Syncytial  Virus by PCR NEGATIVE NEGATIVE Final    Comment: (NOTE) Fact Sheet for Patients: BloggerCourse.com  Fact Sheet for Healthcare Providers: SeriousBroker.it  This test is not yet approved or cleared by the [place] FDA and has been authorized for detection and/or diagnosis of SARS-CoV-2 by FDA under an Emergency Use Authorization (EUA). This EUA will remain in effect (meaning this test can be used) for the duration of the COVID-19 declaration under Section 564(b)(1) of the Act, 21 U.S.C. section 360bbb-3(b)(1), unless the authorization is terminated or revoked.  Performed at [facility], [address], [place], [place] [address]     Time coordinating discharge:  25 minutes.   SIGNED:  Lorretta Harp, MD [facility] 04/14/2023, 1:07 AM   If 7PM-7AM, please contact night-coverage [email or web address]

## 2023-04-14 NOTE — Assessment & Plan Note (Addendum)
Hard to tell if this was UTI or dehydration (both likely actually). The jardiance is implicated with both of those Improved today Does have some cognitive and mood changes since brain surgery--but this was more extreme Stop the PM med---just in case

## 2023-04-16 LAB — URINE CULTURE: Culture: >=100000 — AB

## 2023-04-17 NOTE — Progress Notes (Signed)
ED Antimicrobial Stewardship Positive Culture Follow Up   Jacqueline Cole is an 73 y.o. female who presented to [facility] on 04/13/2023 with a chief complaint of change in mental status and urinary frequency with incontinence.   Chief Complaint  Patient presents with   Altered Mental Status   Recent Results (from the past 720 hours)  Resp panel by RT-PCR (RSV, Flu A&B, Covid) Anterior Nasal Swab     Status: None   Collection Time: 04/13/23 12:30 PM   Specimen: Anterior Nasal Swab  Result Value Ref Range Status   SARS Coronavirus 2 by RT PCR NEGATIVE NEGATIVE Final    Comment: (NOTE) SARS-CoV-2 target nucleic acids are NOT DETECTED.  The SARS-CoV-2 RNA is generally detectable in upper respiratory specimens during the acute phase of infection. The lowest concentration of SARS-CoV-2 viral copies this assay can detect is 138 copies/mL. A negative result does not preclude SARS-Cov-2 infection and should not be used as the sole basis for treatment or other patient management decisions. A negative result may occur with  improper specimen collection/handling, submission of specimen other than nasopharyngeal swab, presence of viral mutation(s) within the areas targeted by this assay, and inadequate number of viral copies(<138 copies/mL). A negative result must be combined with clinical observations, patient history, and epidemiological information. The expected result is Negative.  Fact Sheet for Patients:  BloggerCourse.com  Fact Sheet for Healthcare Providers:  SeriousBroker.it  This test is no t yet approved or cleared by the [place] FDA and  has been authorized for detection and/or diagnosis of SARS-CoV-2 by FDA under an Emergency Use Authorization (EUA). This EUA will remain  in effect (meaning this test can be used) for the duration of the COVID-19 declaration under Section 564(b)(1) of the Act, 21 U.S.C.section  360bbb-3(b)(1), unless the authorization is terminated  or revoked sooner.       Influenza A by PCR NEGATIVE NEGATIVE Final   Influenza B by PCR NEGATIVE NEGATIVE Final    Comment: (NOTE) The Xpert Xpress SARS-CoV-2/FLU/RSV plus assay is intended as an aid in the diagnosis of influenza from Nasopharyngeal swab specimens and should not be used as a sole basis for treatment. Nasal washings and aspirates are unacceptable for Xpert Xpress SARS-CoV-2/FLU/RSV testing.  Fact Sheet for Patients: BloggerCourse.com  Fact Sheet for Healthcare Providers: SeriousBroker.it  This test is not yet approved or cleared by the [place] FDA and has been authorized for detection and/or diagnosis of SARS-CoV-2 by FDA under an Emergency Use Authorization (EUA). This EUA will remain in effect (meaning this test can be used) for the duration of the COVID-19 declaration under Section 564(b)(1) of the Act, 21 U.S.C. section 360bbb-3(b)(1), unless the authorization is terminated or revoked.     Resp Syncytial Virus by PCR NEGATIVE NEGATIVE Final    Comment: (NOTE) Fact Sheet for Patients: BloggerCourse.com  Fact Sheet for Healthcare Providers: SeriousBroker.it  This test is not yet approved or cleared by the [place] FDA and has been authorized for detection and/or diagnosis of SARS-CoV-2 by FDA under an Emergency Use Authorization (EUA). This EUA will remain in effect (meaning this test can be used) for the duration of the COVID-19 declaration under Section 564(b)(1) of the Act, 21 U.S.C. section 360bbb-3(b)(1), unless the authorization is terminated or revoked.  Performed at [facility], [address] [place], [place] [address]   Blood culture (routine x 2)     Status: None (Preliminary result)   Collection Time: 04/13/23 12:36 PM  Specimen: BLOOD  Result Value Ref Range  Status   Specimen Description BLOOD LEFT ARM  Final   Special Requests   Final    BOTTLES DRAWN AEROBIC AND ANAEROBIC Blood Culture adequate volume   Culture   Final    NO GROWTH 4 DAYS Performed at [facility], [address], [place], [place] [address]    Report Status PENDING  Incomplete  Urine Culture (for pregnant, neutropenic or urologic patients or patients with an indwelling urinary catheter)     Status: Abnormal   Collection Time: 04/13/23  5:43 PM   Specimen: Urine, Clean Catch  Result Value Ref Range Status   Specimen Description   Final    URINE, CLEAN CATCH Performed at [facility], [address], [place], [place] [address]    Special Requests   Final    NONE Performed at [facility], [address], [place], [place] [address]    Culture >=100,000 COLONIES/mL KLEBSIELLA PNEUMONIAE (A)  Final   Report Status 04/16/2023 FINAL  Final   Organism ID, Bacteria KLEBSIELLA PNEUMONIAE (A)  Final      Susceptibility   Klebsiella pneumoniae - MIC*    AMPICILLIN RESISTANT Resistant     CEFAZOLIN <=4 SENSITIVE Sensitive     CEFEPIME <=0.12 SENSITIVE Sensitive     CEFTRIAXONE <=0.25 SENSITIVE Sensitive     CIPROFLOXACIN <=0.25 SENSITIVE Sensitive     GENTAMICIN <=1 SENSITIVE Sensitive     IMIPENEM <=0.25 SENSITIVE Sensitive     NITROFURANTOIN <=16 SENSITIVE Sensitive     TRIMETH/SULFA <=20 SENSITIVE Sensitive     AMPICILLIN/SULBACTAM <=2 SENSITIVE Sensitive     PIP/TAZO <=4 SENSITIVE Sensitive ug/mL    * >=100,000 COLONIES/mL KLEBSIELLA PNEUMONIAE    []  Treated with ---, organism resistant to prescribed antimicrobial [x]  Patient discharged originally without antimicrobial agent and treatment is now indicated  Following up with Anthonette Legato. They had an follow-up visit on 1/30.   Effie Shy, PharmD Pharmacy Resident  04/17/2023 2:18 PM

## 2023-04-18 LAB — CULTURE, BLOOD (ROUTINE X 2)
Culture: NO GROWTH
Special Requests: ADEQUATE

## 2023-04-28 ENCOUNTER — Encounter: Payer: Self-pay | Admitting: Internal Medicine

## 2023-04-28 ENCOUNTER — Ambulatory Visit: Payer: Medicare HMO | Admitting: Internal Medicine

## 2023-04-28 VITALS — BP 112/72 | HR 72 | Temp 97.8°F | Ht 65.0 in | Wt 219.0 lb

## 2023-04-28 DIAGNOSIS — N1832 Chronic kidney disease, stage 3b: Secondary | ICD-10-CM

## 2023-04-28 DIAGNOSIS — D329 Benign neoplasm of meninges, unspecified: Secondary | ICD-10-CM | POA: Diagnosis not present

## 2023-04-28 DIAGNOSIS — E1129 Type 2 diabetes mellitus with other diabetic kidney complication: Secondary | ICD-10-CM

## 2023-04-28 MED ORDER — ONETOUCH VERIO VI STRP: ORAL_STRIP | 12 refills | Status: AC

## 2023-04-28 NOTE — Progress Notes (Signed)
Subjective:    Patient ID: Jacqueline Cole, female    DOB: April 10, 1950, 73 y.o.   MRN: [id number]  HPI Here for follow up with husband  No urinary symptoms now  Doing well with fluids now Feels better hydrated Will need recheck on the kidney function  Has CGM---husband still buying it Did have a low sugar reaction this morning--mild. This hasn't been regular Had time in green of 50% till the past week Estimated A1c 7.6%---but again over 8% for the past 7 days (may be eating more normally for her now--recovered from her episode) Has been eating more fruit Only sugar free drinks  Current Outpatient Medications on File Prior to Visit  Medication Sig Dispense Refill   atorvastatin (LIPITOR) 20 MG tablet TAKE ONE TABLET BY MOUTH ONCE WEEKLY ON WEDNESDAY and TAKE ONE TABLET BY MOUTH ONCE WEEKLY ON SUNDAY 26 tablet 3   Blood Glucose Monitoring Suppl (ONETOUCH VERIO FLEX SYSTEM) w/Device KIT Use to check blood sugar 2 times a day. 1 kit 0   Continuous Glucose Sensor (FREESTYLE LIBRE 3 PLUS SENSOR) MISC Change sensor every 15 days. 6 each 3   gabapentin (NEURONTIN) 300 MG capsule TAKE TWO CAPSULES BY MOUTH EVERYDAY AT BEDTIME 180 capsule 3   glipiZIDE (GLUCOTROL) 5 MG tablet Take 1 tablet (5 mg total) by mouth 2 (two) times daily before a meal. 60 tablet 11   glucose blood (ONETOUCH VERIO) test strip Use as instructed to check blood sugar 2 times a day. 200 each 12   insulin glargine (LANTUS) 100 UNIT/ML injection Inject 0.35 mLs (35 Units total) into the skin 2 (two) times daily. (Patient taking differently: Inject 35 Units into the skin 2 (two) times daily. 35 am , 30 pm) 20 mL 12   Lancets (ONETOUCH ULTRASOFT) lancets Use as instructed to check blood sugar 2 times a day. 200 each 12   venlafaxine XR (EFFEXOR-XR) 150 MG 24 hr capsule TAKE ONE CAPSULE BY MOUTH EVERY MORNING and TAKE ONE CAPSULE BY MOUTH EVERYDAY AT BEDTIME 180 capsule 3   No current facility-administered medications on file  prior to visit.    Allergies  Allergen Reactions   Onglyza [Saxagliptin] Hives and Nausea And Vomiting   Semaglutide(0.25 Or 0.5mg -Dos) Nausea And Vomiting and Other (See Comments)    Extreme dehydration   Liraglutide Nausea Only    Can't eat, nausea, higher sugars   Tape Other (See Comments)    Thin skin     Past Medical History:  Diagnosis Date   Brain tumor (benign) (HCC)    Chronic kidney disease    stage 3   Depression    Diabetes mellitus type II    Headache    brain tumor   Hyperlipemia    Major depressive disorder, recurrent, in remission (HCC) 09/01/2006   Qualifier: Diagnosis of  By: [name]     Multiple fibroadenomata of both breasts    Obesity    Osteopenia    PAD (peripheral artery disease) (HCC)    on screening   Syncope 1998   DM diagnosis    Past Surgical History:  Procedure Laterality Date   ABDOMINAL HYSTERECTOMY  ~2005   and BSO   APPLICATION OF CRANIAL NAVIGATION N/A 12/01/2022   Procedure: APPLICATION OF CRANIAL NAVIGATION;  Surgeon: Venetia Night, MD;  Location: [facility] ORS;  Service: Neurosurgery;  Laterality: N/A;   BREAST BIOPSY Right 01/05/2019   rt stereo bx 2 areas 1st area ribbon clip   BREAST BIOPSY Right  01/05/2019   rt stereo bx 2nd area coil clip   CATARACT EXTRACTION W/ INTRAOCULAR LENS & ANTERIOR VITRECTOMY Bilateral 01/2022   CHOLECYSTECTOMY  1975   COSMETIC SURGERY     Injury Right face as a child   CRANIOTOMY N/A 12/01/2022   Procedure: RIGHT FRONTAL CRANIOTOMY FOR TUMOR EXCISION;  Surgeon: Venetia Night, MD;  Location: [facility] ORS;  Service: Neurosurgery;  Laterality: N/A;   ESOPHAGOGASTRODUODENOSCOPY (EGD) WITH PROPOFOL N/A 11/18/2020   Procedure: ESOPHAGOGASTRODUODENOSCOPY (EGD) WITH PROPOFOL;  Surgeon: Wyline Mood, MD;  Location: [facility] ENDOSCOPY;  Service: Gastroenterology;  Laterality: N/A;   VAGINAL DELIVERY     X 2    Family History  Problem Relation Age of Onset   Stroke Father        CVA   Heart  disease Father        MI   Cancer Sister        breast cancer   Breast cancer Sister 64   Heart disease Brother        MI   Cancer Brother        Lung    Social History   Socioeconomic History   Marital status: Married    Spouse name: Not on file   Number of children: 2   Years of education: Not on file   Highest education level: Not on file  Occupational History    Comment: "Just up and quit" at [employer]  Tobacco Use   Smoking status: Former    Current packs/day: 0.00    Types: Cigarettes    Quit date: 03/15/1993    Years since quitting: 30.1    Passive exposure: Never   Smokeless tobacco: Never  Vaping Use   Vaping status: Never Used  Substance and Sexual Activity   Alcohol use: No    Alcohol/week: 0.0 standard drinks of alcohol   Drug use: No   Sexual activity: Not Currently    Birth control/protection: Surgical  Other Topics Concern   Not on file  Social History Narrative   Has living will   Husband, then daughter Arlana Hove, have health care POA   Would reluctantly accept resuscitation but no life prolonging measures   Social Drivers of Health   Financial Resource Strain: Low Risk  (04/03/2020)   Overall Financial Resource Strain (CARDIA)    Difficulty of Paying Living Expenses: Not very hard  Food Insecurity: No Food Insecurity (12/01/2022)   Hunger Vital Sign    Worried About Running Out of Food in the Last Year: Never true    Ran Out of Food in the Last Year: Never true  Transportation Needs: No Transportation Needs (12/01/2022)   PRAPARE - Administrator, Civil Service (Medical): No    Lack of Transportation (Non-Medical): No  Physical Activity: Not on file  Stress: Not on file  Social Connections: Not on file  Intimate Partner Violence: Not At Risk (12/01/2022)   Humiliation, Afraid, Rape, and Kick questionnaire    Fear of Current or Ex-Partner: No    Emotionally Abused: No    Physically Abused: No    Sexually Abused: No   Review of  Systems Sleeps well Appetite is fine---has lost 4# since last visit     Objective:   Physical Exam Constitutional:      Appearance: Normal appearance.  Cardiovascular:     Rate and Rhythm: Normal rate and regular rhythm.     Heart sounds: No murmur heard.    No gallop.  Pulmonary:     Effort: Pulmonary effort is normal.     Breath sounds: Normal breath sounds. No wheezing or rales.  Musculoskeletal:     Cervical back: Neck supple.     Right lower leg: No edema.     Left lower leg: No edema.  Lymphadenopathy:     Cervical: No cervical adenopathy.  Neurological:     Mental Status: She is alert.            Assessment & Plan:

## 2023-04-28 NOTE — Assessment & Plan Note (Signed)
Sugars seem to be acceptable but not optimal On glipizide 5 bid---mostly not causing hypoglycemia Lantus 35 units bid She will get back to walking and be careful with eating. Bad side effects with jardiance and ozempic---so not excited about adding meds

## 2023-04-28 NOTE — Assessment & Plan Note (Addendum)
Overall has done better since the surgery Now will go back at 1 year for neurosurgery follow up

## 2023-04-28 NOTE — Assessment & Plan Note (Signed)
Will recheck labs---likely worsened last time due to prerenal cause Now off jardiance If worsens, will get back with Dr Thedore Mins

## 2023-04-28 NOTE — Addendum Note (Signed)
Addended by: Alvina Chou on: 04/28/2023 11:48 AM   Modules accepted: Orders

## 2023-05-18 ENCOUNTER — Other Ambulatory Visit: Payer: Self-pay | Admitting: Internal Medicine

## 2023-05-18 DIAGNOSIS — E1142 Type 2 diabetes mellitus with diabetic polyneuropathy: Secondary | ICD-10-CM

## 2023-05-31 ENCOUNTER — Encounter: Payer: Medicare HMO | Admitting: Internal Medicine

## 2023-07-26 ENCOUNTER — Encounter: Payer: Medicare HMO | Admitting: Internal Medicine

## 2023-07-28 DIAGNOSIS — E782 Mixed hyperlipidemia: Secondary | ICD-10-CM | POA: Diagnosis not present

## 2023-07-28 DIAGNOSIS — Z0000 Encounter for general adult medical examination without abnormal findings: Secondary | ICD-10-CM | POA: Diagnosis not present

## 2023-07-28 DIAGNOSIS — Z1331 Encounter for screening for depression: Secondary | ICD-10-CM | POA: Diagnosis not present

## 2023-07-28 DIAGNOSIS — D329 Benign neoplasm of meninges, unspecified: Secondary | ICD-10-CM | POA: Diagnosis not present

## 2023-07-28 DIAGNOSIS — E1169 Type 2 diabetes mellitus with other specified complication: Secondary | ICD-10-CM | POA: Diagnosis not present

## 2023-07-28 DIAGNOSIS — E1140 Type 2 diabetes mellitus with diabetic neuropathy, unspecified: Secondary | ICD-10-CM | POA: Diagnosis not present

## 2023-07-28 DIAGNOSIS — Z794 Long term (current) use of insulin: Secondary | ICD-10-CM | POA: Diagnosis not present

## 2023-08-03 DIAGNOSIS — E538 Deficiency of other specified B group vitamins: Secondary | ICD-10-CM | POA: Diagnosis not present

## 2023-08-10 ENCOUNTER — Telehealth: Payer: Self-pay

## 2023-08-10 MED ORDER — ATORVASTATIN CALCIUM 20 MG PO TABS: ORAL_TABLET | ORAL | 3 refills | Status: AC

## 2023-08-10 NOTE — Telephone Encounter (Signed)
 Rx sent electronically.

## 2023-08-25 DIAGNOSIS — F32A Depression, unspecified: Secondary | ICD-10-CM | POA: Diagnosis not present

## 2023-08-25 DIAGNOSIS — Z794 Long term (current) use of insulin: Secondary | ICD-10-CM | POA: Diagnosis not present

## 2023-08-25 DIAGNOSIS — Z1331 Encounter for screening for depression: Secondary | ICD-10-CM | POA: Diagnosis not present

## 2023-08-25 DIAGNOSIS — E1140 Type 2 diabetes mellitus with diabetic neuropathy, unspecified: Secondary | ICD-10-CM | POA: Diagnosis not present

## 2023-08-25 DIAGNOSIS — Z0000 Encounter for general adult medical examination without abnormal findings: Secondary | ICD-10-CM | POA: Diagnosis not present

## 2023-08-25 DIAGNOSIS — E785 Hyperlipidemia, unspecified: Secondary | ICD-10-CM | POA: Diagnosis not present

## 2023-10-25 ENCOUNTER — Other Ambulatory Visit: Payer: Self-pay | Admitting: Internal Medicine

## 2023-10-25 DIAGNOSIS — E1142 Type 2 diabetes mellitus with diabetic polyneuropathy: Secondary | ICD-10-CM

## 2023-11-24 ENCOUNTER — Telehealth: Payer: Self-pay | Admitting: *Deleted

## 2023-11-24 NOTE — Telephone Encounter (Signed)
 Last OV was a DM f/u on 10/25/23 with Dr. Jimmy.  Received fax from [facility] [address] stating:  Lantus  vials (and all generics) are on backorder. Please resend for pens or an alternative medication.

## 2023-11-25 MED ORDER — INSULIN GLARGINE 100 UNIT/ML SOLOSTAR PEN: 35.0000 [IU] | PEN_INJECTOR | Freq: Two times a day (BID) | SUBCUTANEOUS | 0 refills | Status: AC

## 2023-11-25 NOTE — Telephone Encounter (Signed)
 Pens are requested instead of the vial will pend a Rx for the pen for review

## 2023-12-06 NOTE — Progress Notes (Signed)
 Jacqueline Cole                                          MRN: [id number]   12/06/2023   The [facility] Quality Team Specialist reviewed this patient medical record for the purposes of chart review for care gap closure. The following were reviewed: chart review for care gap closure-glycemic status assessment.    [facility] Quality Team

## 2024-02-06 ENCOUNTER — Telehealth: Payer: Self-pay | Admitting: Internal Medicine

## 2024-02-06 NOTE — Telephone Encounter (Signed)
 Contacted Jacqueline Cole to schedule their annual wellness visit. Patient declined to schedule AWV at this time. Transferred care, now seeing Dr. Oneil Pinal.  [facility] Care Guide [facility] AWV TEAM Direct Dial: [phone]

## 2024-02-23 NOTE — Telephone Encounter (Signed)
 PCP changed

## 2024-03-02 ENCOUNTER — Other Ambulatory Visit: Payer: Self-pay | Admitting: Internal Medicine

## 2024-03-02 DIAGNOSIS — E1169 Type 2 diabetes mellitus with other specified complication: Secondary | ICD-10-CM

## 2024-03-02 DIAGNOSIS — D329 Benign neoplasm of meninges, unspecified: Secondary | ICD-10-CM

## 2024-03-05 ENCOUNTER — Other Ambulatory Visit (INDEPENDENT_AMBULATORY_CARE_PROVIDER_SITE_OTHER): Payer: Self-pay | Admitting: Nurse Practitioner

## 2024-03-05 ENCOUNTER — Ambulatory Visit (INDEPENDENT_AMBULATORY_CARE_PROVIDER_SITE_OTHER): Payer: Self-pay

## 2024-03-05 DIAGNOSIS — I739 Peripheral vascular disease, unspecified: Secondary | ICD-10-CM

## 2024-03-06 ENCOUNTER — Encounter (INDEPENDENT_AMBULATORY_CARE_PROVIDER_SITE_OTHER): Payer: Self-pay | Admitting: Vascular Surgery

## 2024-03-06 ENCOUNTER — Ambulatory Visit (INDEPENDENT_AMBULATORY_CARE_PROVIDER_SITE_OTHER): Admitting: Vascular Surgery

## 2024-03-06 VITALS — BP 136/69 | HR 68 | Resp 18 | Ht 66.0 in | Wt 224.4 lb

## 2024-03-06 DIAGNOSIS — E785 Hyperlipidemia, unspecified: Secondary | ICD-10-CM

## 2024-03-06 DIAGNOSIS — I70219 Atherosclerosis of native arteries of extremities with intermittent claudication, unspecified extremity: Secondary | ICD-10-CM | POA: Diagnosis not present

## 2024-03-06 DIAGNOSIS — E1142 Type 2 diabetes mellitus with diabetic polyneuropathy: Secondary | ICD-10-CM | POA: Diagnosis not present

## 2024-03-06 NOTE — Progress Notes (Signed)
 "  Subjective:    Patient ID: Jacqueline Cole, female    DOB: 28-Nov-1950, 73 y.o.   MRN: [id number] Chief Complaint  Patient presents with   Establish Care    Urgent, new patient + consult claudication in PVD ref. Miller   Follow-up    Jacqueline Cole is a 73 yo female who presents to clinic as a new patient consult for claudication of bilateral lower extremities.  Patient underwent Dew arterial duplex ultrasounds of bilateral lower extremities with ABIs yesterday on 03/05/2024.  Right ABI today is 0.80 with right TBI at 0.72 Left ABI today is 0.74 and left TBI is 0.49  All waveforms are listed as biphasic with normal waveforms to the great toe on the right and abnormal waveform to the great toe on the left.  On exam today in clinic patient states her right leg is bothering her more than her left leg but both will cramp and stop her from walking more than 50 feet today.  She states that she has to sit down if she has to walk long distances.  She said this started about a year ago and has progressively gotten worse.  She does have palpable pulses in both of her feet but they are very weak and hard to feel.  She does not have any open sores or breakdown of any skin to her lower extremities at this time.  She does have slight swelling at +1 to both of her lower extremities.  She does have a bunion to her right foot which she states she is unable to wear socks or wear shoes because of this.  She does not know whether this is related to her difficulties in walking.       Review of Systems  Constitutional: Negative.   Cardiovascular:  Positive for leg swelling.  Musculoskeletal:  Positive for myalgias.  Neurological:  Positive for numbness.       Patient has a history of brain tumor removal.  All other systems reviewed and are negative.      Objective:   Physical Exam Constitutional:      Appearance: Normal appearance. She is obese.  HENT:     Head: Normocephalic and atraumatic.  Eyes:      Pupils: Pupils are equal, round, and reactive to light.  Cardiovascular:     Rate and Rhythm: Normal rate and regular rhythm.     Pulses: Normal pulses.     Heart sounds: Normal heart sounds.  Pulmonary:     Effort: Pulmonary effort is normal.     Breath sounds: Normal breath sounds.  Abdominal:     General: Bowel sounds are normal.     Palpations: Abdomen is soft.  Musculoskeletal:        General: Swelling present.  Skin:    General: Skin is warm and dry.     Capillary Refill: Capillary refill takes more than 3 seconds.  Neurological:     General: No focal deficit present.     Mental Status: She is alert and oriented to person, place, and time. Mental status is at baseline.  Psychiatric:        Mood and Affect: Mood normal.        Behavior: Behavior normal.        Thought Content: Thought content normal.        Judgment: Judgment normal.      BP 136/69 (BP Location: Right Arm, Patient Position: Sitting, Cuff Size: Normal)   Pulse 68  Resp 18   Ht 5' 6 (1.676 m)   Wt 224 lb 6.4 oz (101.8 kg)   BMI 36.22 kg/m   Past Medical History:  Diagnosis Date   Brain tumor (benign) (HCC)    Chronic kidney disease    stage 3   Depression    Diabetes mellitus type II    Headache    brain tumor   Hyperlipemia    Major depressive disorder, recurrent, in remission 09/01/2006   Qualifier: Diagnosis of  By: Adriana Macintosh     Multiple fibroadenomata of both breasts    Obesity    Osteopenia    PAD (peripheral artery disease)    on screening   Syncope 1998   DM diagnosis    Social History   Socioeconomic History   Marital status: Married    Spouse name: Not on file   Number of children: 2   Years of education: Not on file   Highest education level: Not on file  Occupational History    Comment: Just up and quit at [employer]  Tobacco Use   Smoking status: Former    Current packs/day: 0.00    Types: Cigarettes    Quit date: 03/15/1993    Years since quitting:  30.9    Passive exposure: Never   Smokeless tobacco: Never  Vaping Use   Vaping status: Never Used  Substance and Sexual Activity   Alcohol use: No    Alcohol/week: 0.0 standard drinks of alcohol   Drug use: No   Sexual activity: Not Currently    Birth control/protection: Surgical  Other Topics Concern   Not on file  Social History Narrative   Has living will   Husband, then daughter Orpha, have health care POA   Would reluctantly accept resuscitation but no life prolonging measures   Social Drivers of Health   Tobacco Use: Medium Risk (03/06/2024)   Patient History    Smoking Tobacco Use: Former    Smokeless Tobacco Use: Never    Passive Exposure: Never  Physicist, Medical Strain: Not on file  Food Insecurity: No Food Insecurity (12/01/2022)   Hunger Vital Sign    Worried About Running Out of Food in the Last Year: Never true    Ran Out of Food in the Last Year: Never true  Transportation Needs: No Transportation Needs (12/01/2022)   PRAPARE - Administrator, Civil Service (Medical): No    Lack of Transportation (Non-Medical): No  Physical Activity: Not on file  Stress: Not on file  Social Connections: Not on file  Intimate Partner Violence: Not At Risk (12/01/2022)   Humiliation, Afraid, Rape, and Kick questionnaire    Fear of Current or Ex-Partner: No    Emotionally Abused: No    Physically Abused: No    Sexually Abused: No  Depression (PHQ2-9): High Risk (09/22/2022)   Depression (PHQ2-9)    PHQ-2 Score: 26  Alcohol Screen: Not on file  Housing: Unknown (07/28/2023)   Received from [facility]   Epic    Unable to Pay for Housing in the Last Year: Not on file    Number of Times Moved in the Last Year: Not on file    At any time in the past 12 months, were you homeless or living in a shelter (including now)?: No  Utilities: Not At Risk (12/01/2022)   [facility] Utilities    Threatened with loss of utilities: No  Health Literacy: Not on file  Past Surgical History:  Procedure Laterality Date   ABDOMINAL HYSTERECTOMY  ~2005   and BSO   APPLICATION OF CRANIAL NAVIGATION N/A 12/01/2022   Procedure: APPLICATION OF CRANIAL NAVIGATION;  Surgeon: Clois Fret, MD;  Location: [facility] ORS;  Service: Neurosurgery;  Laterality: N/A;   BREAST BIOPSY Right 01/05/2019   rt stereo bx 2 areas 1st area ribbon clip   BREAST BIOPSY Right 01/05/2019   rt stereo bx 2nd area coil clip   CATARACT EXTRACTION W/ INTRAOCULAR LENS & ANTERIOR VITRECTOMY Bilateral 01/2022   CHOLECYSTECTOMY  1975   COSMETIC SURGERY     Injury Right face as a child   CRANIOTOMY N/A 12/01/2022   Procedure: RIGHT FRONTAL CRANIOTOMY FOR TUMOR EXCISION;  Surgeon: Clois Fret, MD;  Location: [facility] ORS;  Service: Neurosurgery;  Laterality: N/A;   ESOPHAGOGASTRODUODENOSCOPY (EGD) WITH PROPOFOL  N/A 11/18/2020   Procedure: ESOPHAGOGASTRODUODENOSCOPY (EGD) WITH PROPOFOL ;  Surgeon: Therisa Bi, MD;  Location: [facility] ENDOSCOPY;  Service: Gastroenterology;  Laterality: N/A;   VAGINAL DELIVERY     X 2    Family History  Problem Relation Age of Onset   Stroke Father        CVA   Heart disease Father        MI   Cancer Sister        breast cancer   Breast cancer Sister 37   Heart disease Brother        MI   Cancer Brother        Lung    Allergies[1]     Latest Ref Rng & Units 04/13/2023   12:36 PM 12/03/2022    7:27 AM 11/19/2022   10:58 AM  CBC  WBC 4.0 - 10.5 K/uL 9.5  15.8  8.3   Hemoglobin 12.0 - 15.0 g/dL 85.9  87.4  86.6   Hematocrit 36.0 - 46.0 % 42.1  38.8  40.5   Platelets 150 - 400 K/uL 195  228  216       CMP     Component Value Date/Time   NA 135 04/13/2023 1236   K 3.5 04/13/2023 1236   CL 101 04/13/2023 1236   CO2 22 04/13/2023 1236   GLUCOSE 110 (H) 04/13/2023 1236   BUN 15 04/13/2023 1236   CREATININE 1.48 (H) 04/13/2023 1236   CALCIUM  9.0 04/13/2023 1236   PROT 7.2 04/13/2023 1236   ALBUMIN 3.7 04/13/2023 1236   AST 46 (H)  04/13/2023 1236   ALT 30 04/13/2023 1236   ALKPHOS 124 04/13/2023 1236   BILITOT 1.7 (H) 04/13/2023 1236   GFR 35.51 (L) 06/30/2022 0829   GFRNONAA 38 (L) 04/13/2023 1236     No results found.     Assessment & Plan:   1. Atherosclerotic peripheral vascular disease with intermittent claudication (Primary) Recommend:  The patient has experienced increased claudication symptoms and is now describing lifestyle limiting claudication and appears to be having mild rest pain symptroms.  Given the severity of the patient's severe right lower extremity symptoms the patient should undergo angiography with the hope for intervention.  Risk and benefits were reviewed the patient.  Indications for the procedure were reviewed.  All questions were answered, the patient agrees to proceed with right  lower extremity angiography and possible intervention.   The patient should continue walking and begin a more formal exercise program.  The patient should continue antiplatelet therapy and aggressive treatment of the lipid abnormalities  The patient will follow up with me after the angiogram.  2. Type 2 diabetes, controlled, with peripheral neuropathy (HCC) Continue hypoglycemic medications as already ordered, these medications have been reviewed and there are no changes at this time.  Hgb A1C to be monitored as already arranged by primary service  3. Hyperlipidemia, unspecified hyperlipidemia type Continue statin as ordered and reviewed, no changes at this time    Medications Ordered Prior to Encounter[2]  There are no Patient Instructions on file for this visit. No follow-ups on file.   Gwendlyn JONELLE Shank, NP      [1]  Allergies Allergen Reactions   Onglyza [Saxagliptin ] Hives and Nausea And Vomiting   Other Nausea And Vomiting and Other (See Comments)    Extreme dehydration   Semaglutide (0.25 Or 0.5mg -Dos) Nausea And Vomiting and Other (See Comments)    Extreme dehydration   Liraglutide   Nausea Only    Can't eat, nausea, higher sugars   Tape Other (See Comments)    Thin skin  [2]  Current Outpatient Medications on File Prior to Visit  Medication Sig Dispense Refill   atorvastatin  (LIPITOR) 20 MG tablet TAKE ONE TABLET BY MOUTH ONCE WEEKLY ON WEDNESDAY and TAKE ONE TABLET BY MOUTH ONCE WEEKLY ON SUNDAY 26 tablet 3   Blood Glucose Monitoring Suppl (ONETOUCH VERIO FLEX SYSTEM) w/Device KIT Use to check blood sugar 2 times a day. 1 kit 0   Continuous Glucose Sensor (FREESTYLE LIBRE 3 SENSOR) MISC USE ONE SENSOR EVERY 14 DAYS 2 each 11   gabapentin  (NEURONTIN ) 300 MG capsule TAKE TWO CAPSULES BY MOUTH EVERYDAY AT BEDTIME 180 capsule 3   gabapentin  (NEURONTIN ) 300 MG capsule Take 300 mg by mouth 3 (three) times daily.     glipiZIDE  (GLUCOTROL ) 5 MG tablet Take 1 tablet (5 mg total) by mouth 2 (two) times daily before a meal. 60 tablet 11   glucose blood (ONETOUCH VERIO) test strip Use as instructed to check blood sugar 2 times a day. 200 each 12   insulin  glargine (LANTUS ) 100 UNIT/ML Solostar Pen Inject 35 Units into the skin 2 (two) times daily. 65 mL 0   Lancets (ONETOUCH ULTRASOFT) lancets Use as instructed to check blood sugar 2 times a day. 200 each 12   LANTUS  100 UNIT/ML injection INJECT 35 UNITS INTO THE SKIN TWO TIMES DAILY 20 mL 0   venlafaxine  XR (EFFEXOR -XR) 150 MG 24 hr capsule TAKE ONE CAPSULE BY MOUTH EVERY MORNING and TAKE ONE CAPSULE BY MOUTH EVERYDAY AT BEDTIME 180 capsule 3   No current facility-administered medications on file prior to visit.   "

## 2024-03-20 ENCOUNTER — Ambulatory Visit
Admission: RE | Admit: 2024-03-20 | Discharge: 2024-03-20 | Disposition: A | Discharge status: Home / Self Care | Payer: PRIVATE HEALTH INSURANCE | Source: Ambulatory Visit | Attending: Internal Medicine | Admitting: Internal Medicine

## 2024-03-20 DIAGNOSIS — D329 Benign neoplasm of meninges, unspecified: Secondary | ICD-10-CM | POA: Insufficient documentation

## 2024-03-20 DIAGNOSIS — E782 Mixed hyperlipidemia: Secondary | ICD-10-CM | POA: Insufficient documentation

## 2024-03-20 DIAGNOSIS — E1169 Type 2 diabetes mellitus with other specified complication: Secondary | ICD-10-CM | POA: Insufficient documentation
